# Patient Record
Sex: Male | Born: 1940 | Race: White | Hispanic: No | Marital: Married | Smoking: Never smoker
Health system: Southern US, Community
[De-identification: ages and names within clinical notes are randomized; demographics above are authoritative.]

## PROBLEM LIST (undated history)

## (undated) DIAGNOSIS — I2510 Atherosclerotic heart disease of native coronary artery without angina pectoris: Secondary | ICD-10-CM

## (undated) DIAGNOSIS — E785 Hyperlipidemia, unspecified: Secondary | ICD-10-CM

## (undated) DIAGNOSIS — E039 Hypothyroidism, unspecified: Secondary | ICD-10-CM

## (undated) DIAGNOSIS — J189 Pneumonia, unspecified organism: Secondary | ICD-10-CM

## (undated) DIAGNOSIS — C801 Malignant (primary) neoplasm, unspecified: Secondary | ICD-10-CM

## (undated) DIAGNOSIS — G4700 Insomnia, unspecified: Secondary | ICD-10-CM

## (undated) DIAGNOSIS — N186 End stage renal disease: Secondary | ICD-10-CM

## (undated) DIAGNOSIS — Z992 Dependence on renal dialysis: Secondary | ICD-10-CM

## (undated) DIAGNOSIS — G629 Polyneuropathy, unspecified: Secondary | ICD-10-CM

## (undated) DIAGNOSIS — I509 Heart failure, unspecified: Secondary | ICD-10-CM

## (undated) DIAGNOSIS — J302 Other seasonal allergic rhinitis: Secondary | ICD-10-CM

## (undated) DIAGNOSIS — D649 Anemia, unspecified: Secondary | ICD-10-CM

## (undated) DIAGNOSIS — R609 Edema, unspecified: Secondary | ICD-10-CM

## (undated) DIAGNOSIS — I96 Gangrene, not elsewhere classified: Secondary | ICD-10-CM

## (undated) DIAGNOSIS — M1990 Unspecified osteoarthritis, unspecified site: Secondary | ICD-10-CM

## (undated) DIAGNOSIS — E11319 Type 2 diabetes mellitus with unspecified diabetic retinopathy without macular edema: Secondary | ICD-10-CM

## (undated) DIAGNOSIS — M1A00X0 Idiopathic chronic gout, unspecified site, without tophus (tophi): Secondary | ICD-10-CM

## (undated) DIAGNOSIS — Z9289 Personal history of other medical treatment: Secondary | ICD-10-CM

## (undated) DIAGNOSIS — I10 Essential (primary) hypertension: Secondary | ICD-10-CM

## (undated) DIAGNOSIS — G4730 Sleep apnea, unspecified: Secondary | ICD-10-CM

## (undated) DIAGNOSIS — M1A9XX0 Chronic gout, unspecified, without tophus (tophi): Secondary | ICD-10-CM

## (undated) DIAGNOSIS — H5440 Blindness, one eye, unspecified eye: Secondary | ICD-10-CM

## (undated) DIAGNOSIS — L309 Dermatitis, unspecified: Secondary | ICD-10-CM

## (undated) DIAGNOSIS — I70209 Unspecified atherosclerosis of native arteries of extremities, unspecified extremity: Secondary | ICD-10-CM

## (undated) DIAGNOSIS — D869 Sarcoidosis, unspecified: Secondary | ICD-10-CM

## (undated) DIAGNOSIS — K219 Gastro-esophageal reflux disease without esophagitis: Secondary | ICD-10-CM

## (undated) HISTORY — DX: Idiopathic chronic gout, unspecified site, without tophus (tophi): M1A.00X0

## (undated) HISTORY — PX: EYE SURGERY: SHX253

## (undated) HISTORY — PX: CARDIAC SURGERY: SHX584

## (undated) HISTORY — DX: Unspecified atherosclerosis of native arteries of extremities, unspecified extremity: I70.209

## (undated) HISTORY — PX: OTHER SURGICAL HISTORY: SHX169

## (undated) HISTORY — DX: Edema, unspecified: R60.9

## (undated) HISTORY — PX: INSERTION OF DIALYSIS CATHETER: SHX1324

## (undated) HISTORY — DX: Hyperlipidemia, unspecified: E78.5

## (undated) HISTORY — DX: Chronic gout, unspecified, without tophus (tophi): M1A.9XX0

## (undated) HISTORY — DX: Atherosclerotic heart disease of native coronary artery without angina pectoris: I25.10

---

## 2009-06-07 HISTORY — PX: CORONARY ARTERY BYPASS GRAFT: SHX141

## 2012-01-21 ENCOUNTER — Emergency Department (HOSPITAL_COMMUNITY)
Admission: EM | Admit: 2012-01-21 | Discharge: 2012-01-21 | Disposition: A | Discharge status: Home / Self Care | Payer: Medicare Other | Attending: Emergency Medicine | Admitting: Emergency Medicine

## 2012-01-21 ENCOUNTER — Encounter (HOSPITAL_COMMUNITY): Payer: Self-pay | Admitting: Emergency Medicine

## 2012-01-21 DIAGNOSIS — R8271 Bacteriuria: Secondary | ICD-10-CM

## 2012-01-21 DIAGNOSIS — R82998 Other abnormal findings in urine: Secondary | ICD-10-CM

## 2012-01-21 DIAGNOSIS — Z79899 Other long term (current) drug therapy: Secondary | ICD-10-CM | POA: Insufficient documentation

## 2012-01-21 DIAGNOSIS — Z794 Long term (current) use of insulin: Secondary | ICD-10-CM | POA: Insufficient documentation

## 2012-01-21 DIAGNOSIS — E119 Type 2 diabetes mellitus without complications: Secondary | ICD-10-CM | POA: Insufficient documentation

## 2012-01-21 DIAGNOSIS — I10 Essential (primary) hypertension: Secondary | ICD-10-CM | POA: Insufficient documentation

## 2012-01-21 DIAGNOSIS — R319 Hematuria, unspecified: Secondary | ICD-10-CM | POA: Insufficient documentation

## 2012-01-21 HISTORY — DX: Essential (primary) hypertension: I10

## 2012-01-21 LAB — URINALYSIS, ROUTINE W REFLEX MICROSCOPIC
Bilirubin Urine: NEGATIVE
Ketones, ur: NEGATIVE mg/dL
Nitrite: NEGATIVE
Urobilinogen, UA: 0.2 mg/dL (ref 0.0–1.0)
pH: 5.5 (ref 5.0–8.0)

## 2012-01-21 LAB — URINE MICROSCOPIC-ADD ON

## 2012-01-21 MED ORDER — CIPROFLOXACIN HCL 500 MG PO TABS: 500.0000 mg | ORAL_TABLET | Freq: Two times a day (BID) | ORAL | Status: AC

## 2012-01-21 NOTE — ED Provider Notes (Signed)
History     CSN: [id number]  Arrival date & time 01/21/12  2018   First MD Initiated Contact with Patient 01/21/12 2152      Chief Complaint  Patient presents with  . Hematuria    (Consider location/radiation/quality/duration/timing/severity/associated sxs/prior treatment) HPI Pt reports blood in his urine since this morning. Pt denies pain.  Patient has had the same problem in the past and it was related to a urinary tract infection.  Patient has no fever or chills.  No nausea vomiting.  Patient's has no local physician the present time, but they just moved here.  I plan on getting a local physician.  I discussed bladder cancer in the differential of painless hematuria.  They assured me they would get a followup UA done after treatment with antibiotics.  This is to make sure that the hematuria has cleared.  Past Medical History  Diagnosis Date  . Hypertension   . Diabetes mellitus     Past Surgical History  Procedure Date  . Cardiac surgery     History reviewed. No pertinent family history.  History  Substance Use Topics  . Smoking status: Never Smoker   . Smokeless tobacco: Not on file  . Alcohol Use: No      Review of Systems  All other systems reviewed and are negative.    Allergies  Flu virus vaccine  Home Medications   Current Outpatient Rx  Name Route Sig Dispense Refill  . RISAQUAD PO CAPS Oral Take 1 capsule by mouth 2 (two) times daily.    . ALLOPURINOL 100 MG PO TABS Oral Take 200 mg by mouth daily.    Marland Kitchen AMLODIPINE BESYLATE 10 MG PO TABS Oral Take 10 mg by mouth daily.    . ASPIRIN 325 MG PO TABS Oral Take 325 mg by mouth daily.    . ATORVASTATIN CALCIUM 80 MG PO TABS Oral Take 80 mg by mouth daily.    Marland Kitchen CALCIUM CARBONATE-VITAMIN D 600-400 MG-UNIT PO TABS Oral Take 1 tablet by mouth daily.    Marland Kitchen CRANBERRY 450 MG PO TABS Oral Take 1 tablet by mouth 2 (two) times daily.    Marland Kitchen FERROUS SULFATE 325 (65 FE) MG PO TABS Oral Take 325 mg by mouth daily with  breakfast.    . FUROSEMIDE 80 MG PO TABS Oral Take 160 mg by mouth 2 (two) times daily.    . INSULIN ASPART 100 UNIT/ML [place] SOLN Subcutaneous Inject 2-10 Units into the skin 3 (three) times daily before meals.    . INSULIN GLARGINE 100 UNIT/ML [place] SOLN Subcutaneous Inject 45 Units into the skin every morning.    Marland Kitchen LEVOTHYROXINE SODIUM 112 MCG PO TABS Oral Take 112 mcg by mouth daily.    Marland Kitchen LISINOPRIL 40 MG PO TABS Oral Take 40 mg by mouth daily.    Marland Kitchen MAGNESIUM OXIDE 400 MG PO TABS Oral Take 400 mg by mouth daily.    Marland Kitchen METOPROLOL TARTRATE 100 MG PO TABS Oral Take 100 mg by mouth 2 (two) times daily.    Marland Kitchen POTASSIUM CHLORIDE CRYS ER 20 MEQ PO TBCR Oral Take 20 mEq by mouth daily.    Marland Kitchen PREDNISONE 1 MG PO TABS Oral Take 1 mg by mouth 2 (two) times daily.    Marland Kitchen CIPROFLOXACIN HCL 500 MG PO TABS Oral Take 1 tablet (500 mg total) by mouth every 12 (twelve) hours. 20 tablet 0    BP 155/77  Pulse 73  Temp 98.6 F (37 C) (Oral)  Resp 20  Ht 5\' 10"  (1.778 m)  Wt 245 lb (111.131 kg)  BMI 35.15 kg/m2  SpO2 97%  Physical Exam  Nursing note and vitals reviewed. Constitutional: He is oriented to person, place, and time. He appears well-developed. No distress.  HENT:  Head: Normocephalic and atraumatic.  Eyes: Pupils are equal, round, and reactive to light.  Neck: Normal range of motion.  Cardiovascular: Normal rate and intact distal pulses.   Pulmonary/Chest: No respiratory distress.  Abdominal: Normal appearance. He exhibits no distension.  Musculoskeletal: Normal range of motion.  Neurological: He is alert and oriented to person, place, and time. No cranial nerve deficit.  Skin: Skin is warm and dry. No rash noted.  Psychiatric: He has a normal mood and affect. His behavior is normal.    ED Course  Procedures (including critical care time)  Labs Reviewed  URINALYSIS, ROUTINE W REFLEX MICROSCOPIC - Abnormal; Notable for the following:    APPearance CLOUDY (*)     Glucose, UA >1000 (*)      Hgb urine dipstick LARGE (*)     Protein, ur 100 (*)     Leukocytes, UA MODERATE (*)     All other components within normal limits  URINE MICROSCOPIC-ADD ON - Abnormal; Notable for the following:    Bacteria, UA MANY (*)     All other components within normal limits  URINE CULTURE   No results found.   1. Bacteria in urine   2. Hematuria   3. Urine WBC increased       MDM          Nelia Shi, MD 01/21/12 2216

## 2012-01-21 NOTE — Discharge Instructions (Signed)
Make sure that you get a recheck urine after the antibiotics are finished and make sure the blood has cleared from the urine.  If there is still blood in the urine you're going to need to see urologist for a possible cystoscopy.  Blood and urine can be a sign of bladder cancer.

## 2012-01-21 NOTE — ED Notes (Signed)
Pt reports blood in his urine since this morning. Pt denies pain.

## 2012-01-24 LAB — URINE CULTURE

## 2012-01-25 NOTE — ED Notes (Signed)
+   Urine Patient treated with Cipro-sensitive to same-chart appended per protocol MD. 

## 2012-10-04 ENCOUNTER — Encounter (HOSPITAL_COMMUNITY): Payer: Self-pay | Admitting: *Deleted

## 2012-10-04 ENCOUNTER — Emergency Department (HOSPITAL_COMMUNITY): Payer: Medicare Other

## 2012-10-04 ENCOUNTER — Inpatient Hospital Stay (HOSPITAL_COMMUNITY)
Admission: EM | Admit: 2012-10-04 | Discharge: 2012-10-08 | DRG: 637 | Disposition: A | Discharge status: Home / Self Care | Payer: Medicare Other | Attending: Internal Medicine | Admitting: Internal Medicine

## 2012-10-04 DIAGNOSIS — Z7982 Long term (current) use of aspirin: Secondary | ICD-10-CM

## 2012-10-04 DIAGNOSIS — G9341 Metabolic encephalopathy: Secondary | ICD-10-CM | POA: Diagnosis present

## 2012-10-04 DIAGNOSIS — IMO0002 Reserved for concepts with insufficient information to code with codable children: Secondary | ICD-10-CM

## 2012-10-04 DIAGNOSIS — R001 Bradycardia, unspecified: Secondary | ICD-10-CM | POA: Diagnosis present

## 2012-10-04 DIAGNOSIS — Z6835 Body mass index (BMI) 35.0-35.9, adult: Secondary | ICD-10-CM

## 2012-10-04 DIAGNOSIS — G4730 Sleep apnea, unspecified: Secondary | ICD-10-CM | POA: Diagnosis present

## 2012-10-04 DIAGNOSIS — Z951 Presence of aortocoronary bypass graft: Secondary | ICD-10-CM

## 2012-10-04 DIAGNOSIS — Z8614 Personal history of Methicillin resistant Staphylococcus aureus infection: Secondary | ICD-10-CM

## 2012-10-04 DIAGNOSIS — E1139 Type 2 diabetes mellitus with other diabetic ophthalmic complication: Secondary | ICD-10-CM | POA: Diagnosis present

## 2012-10-04 DIAGNOSIS — I509 Heart failure, unspecified: Secondary | ICD-10-CM | POA: Diagnosis present

## 2012-10-04 DIAGNOSIS — I120 Hypertensive chronic kidney disease with stage 5 chronic kidney disease or end stage renal disease: Secondary | ICD-10-CM | POA: Diagnosis present

## 2012-10-04 DIAGNOSIS — M109 Gout, unspecified: Secondary | ICD-10-CM | POA: Diagnosis present

## 2012-10-04 DIAGNOSIS — E1129 Type 2 diabetes mellitus with other diabetic kidney complication: Secondary | ICD-10-CM | POA: Diagnosis present

## 2012-10-04 DIAGNOSIS — D649 Anemia, unspecified: Secondary | ICD-10-CM | POA: Diagnosis present

## 2012-10-04 DIAGNOSIS — R6881 Early satiety: Secondary | ICD-10-CM | POA: Diagnosis present

## 2012-10-04 DIAGNOSIS — E669 Obesity, unspecified: Secondary | ICD-10-CM | POA: Diagnosis present

## 2012-10-04 DIAGNOSIS — I498 Other specified cardiac arrhythmias: Secondary | ICD-10-CM | POA: Diagnosis present

## 2012-10-04 DIAGNOSIS — E860 Dehydration: Secondary | ICD-10-CM | POA: Diagnosis present

## 2012-10-04 DIAGNOSIS — H5440 Blindness, one eye, unspecified eye: Secondary | ICD-10-CM | POA: Diagnosis present

## 2012-10-04 DIAGNOSIS — Z794 Long term (current) use of insulin: Secondary | ICD-10-CM

## 2012-10-04 DIAGNOSIS — N189 Chronic kidney disease, unspecified: Secondary | ICD-10-CM | POA: Diagnosis present

## 2012-10-04 DIAGNOSIS — R680 Hypothermia, not associated with low environmental temperature: Secondary | ICD-10-CM | POA: Diagnosis present

## 2012-10-04 DIAGNOSIS — E039 Hypothyroidism, unspecified: Secondary | ICD-10-CM | POA: Diagnosis present

## 2012-10-04 DIAGNOSIS — N058 Unspecified nephritic syndrome with other morphologic changes: Secondary | ICD-10-CM | POA: Diagnosis present

## 2012-10-04 DIAGNOSIS — T68XXXA Hypothermia, initial encounter: Secondary | ICD-10-CM

## 2012-10-04 DIAGNOSIS — I2510 Atherosclerotic heart disease of native coronary artery without angina pectoris: Secondary | ICD-10-CM | POA: Diagnosis present

## 2012-10-04 DIAGNOSIS — N039 Chronic nephritic syndrome with unspecified morphologic changes: Secondary | ICD-10-CM | POA: Diagnosis present

## 2012-10-04 DIAGNOSIS — R609 Edema, unspecified: Secondary | ICD-10-CM | POA: Diagnosis present

## 2012-10-04 DIAGNOSIS — D631 Anemia in chronic kidney disease: Secondary | ICD-10-CM | POA: Diagnosis present

## 2012-10-04 DIAGNOSIS — G609 Hereditary and idiopathic neuropathy, unspecified: Secondary | ICD-10-CM | POA: Diagnosis present

## 2012-10-04 DIAGNOSIS — E11319 Type 2 diabetes mellitus with unspecified diabetic retinopathy without macular edema: Secondary | ICD-10-CM | POA: Diagnosis present

## 2012-10-04 DIAGNOSIS — E162 Hypoglycemia, unspecified: Secondary | ICD-10-CM | POA: Diagnosis present

## 2012-10-04 DIAGNOSIS — R578 Other shock: Secondary | ICD-10-CM | POA: Diagnosis present

## 2012-10-04 DIAGNOSIS — E1169 Type 2 diabetes mellitus with other specified complication: Principal | ICD-10-CM | POA: Diagnosis present

## 2012-10-04 DIAGNOSIS — Z79899 Other long term (current) drug therapy: Secondary | ICD-10-CM

## 2012-10-04 DIAGNOSIS — N185 Chronic kidney disease, stage 5: Secondary | ICD-10-CM | POA: Diagnosis present

## 2012-10-04 DIAGNOSIS — N179 Acute kidney failure, unspecified: Secondary | ICD-10-CM | POA: Diagnosis present

## 2012-10-04 DIAGNOSIS — I959 Hypotension, unspecified: Secondary | ICD-10-CM | POA: Diagnosis present

## 2012-10-04 HISTORY — DX: Sleep apnea, unspecified: G47.30

## 2012-10-04 HISTORY — DX: Hypothyroidism, unspecified: E03.9

## 2012-10-04 HISTORY — DX: Anemia, unspecified: D64.9

## 2012-10-04 HISTORY — DX: Blindness, one eye, unspecified eye: H54.40

## 2012-10-04 HISTORY — DX: Heart failure, unspecified: I50.9

## 2012-10-04 HISTORY — DX: Atherosclerotic heart disease of native coronary artery without angina pectoris: I25.10

## 2012-10-04 LAB — COMPREHENSIVE METABOLIC PANEL
BUN: 75 mg/dL — ABNORMAL HIGH (ref 6–23)
CO2: 24 mEq/L (ref 19–32)
Calcium: 9.1 mg/dL (ref 8.4–10.5)
Creatinine, Ser: 4.59 mg/dL — ABNORMAL HIGH (ref 0.50–1.35)
GFR calc Af Amer: 14 mL/min — ABNORMAL LOW (ref >90)
GFR calc non Af Amer: 12 mL/min — ABNORMAL LOW (ref >90)
Glucose, Bld: 114 mg/dL — ABNORMAL HIGH (ref 70–99)
Sodium: 135 mEq/L (ref 135–145)
Total Protein: 7 g/dL (ref 6.0–8.3)

## 2012-10-04 LAB — CBC
HCT: 30.1 % — ABNORMAL LOW (ref 39.0–52.0)
Hemoglobin: 9.9 g/dL — ABNORMAL LOW (ref 13.0–17.0)
MCHC: 32.9 g/dL (ref 30.0–36.0)
RDW: 13.6 % (ref 11.5–15.5)
WBC: 7.5 10*3/uL (ref 4.0–10.5)

## 2012-10-04 LAB — CBC WITH DIFFERENTIAL/PLATELET
Eosinophils Absolute: 0.6 10*3/uL (ref 0.0–0.7)
Eosinophils Relative: 8 % — ABNORMAL HIGH (ref 0–5)
HCT: 31.4 % — ABNORMAL LOW (ref 39.0–52.0)
Lymphocytes Relative: 13 % (ref 12–46)
Lymphs Abs: 1 10*3/uL (ref 0.7–4.0)
MCH: 30.3 pg (ref 26.0–34.0)
MCV: 89.7 fL (ref 78.0–100.0)
Monocytes Absolute: 0.6 10*3/uL (ref 0.1–1.0)
Monocytes Relative: 8 % (ref 3–12)
Platelets: 289 10*3/uL (ref 150–400)
RBC: 3.5 MIL/uL — ABNORMAL LOW (ref 4.22–5.81)

## 2012-10-04 LAB — CREATININE, SERUM
Creatinine, Ser: 4.68 mg/dL — ABNORMAL HIGH (ref 0.50–1.35)
GFR calc Af Amer: 13 mL/min — ABNORMAL LOW (ref >90)
GFR calc non Af Amer: 12 mL/min — ABNORMAL LOW (ref >90)

## 2012-10-04 LAB — URINALYSIS, ROUTINE W REFLEX MICROSCOPIC
Bilirubin Urine: NEGATIVE
Leukocytes, UA: NEGATIVE
Nitrite: NEGATIVE
Specific Gravity, Urine: 1.013 (ref 1.005–1.030)
Urobilinogen, UA: 0.2 mg/dL (ref 0.0–1.0)
pH: 5 (ref 5.0–8.0)

## 2012-10-04 LAB — TROPONIN I: Troponin I: <0.3 ng/mL (ref <0.30)

## 2012-10-04 LAB — GLUCOSE, CAPILLARY
Glucose-Capillary: 115 mg/dL — ABNORMAL HIGH (ref 70–99)
Glucose-Capillary: 150 mg/dL — ABNORMAL HIGH (ref 70–99)

## 2012-10-04 LAB — PROCALCITONIN: Procalcitonin: <0.1 ng/mL

## 2012-10-04 LAB — URINE MICROSCOPIC-ADD ON: Urine-Other: NONE SEEN

## 2012-10-04 MED ORDER — ONDANSETRON HCL 4 MG/2ML IJ SOLN: 4.0000 mg | Freq: Four times a day (QID) | INTRAMUSCULAR | Status: DC | PRN

## 2012-10-04 MED ORDER — ONDANSETRON HCL 4 MG/2ML IJ SOLN
4.0000 mg | Freq: Once | INTRAMUSCULAR | Status: AC
  Administered 2012-10-04: 4 mg via INTRAVENOUS
  Filled 2012-10-04: qty 2

## 2012-10-04 MED ORDER — GLUCOSE 40 % PO GEL: 1.0000 | ORAL | Status: DC | PRN

## 2012-10-04 MED ORDER — HYDROCODONE-ACETAMINOPHEN 5-325 MG PO TABS: 1.0000 | ORAL_TABLET | ORAL | Status: DC | PRN

## 2012-10-04 MED ORDER — ONDANSETRON HCL 4 MG PO TABS: 4.0000 mg | ORAL_TABLET | Freq: Four times a day (QID) | ORAL | Status: DC | PRN

## 2012-10-04 MED ORDER — GLUCOSE-VITAMIN C 4-6 GM-MG PO CHEW: 4.0000 | CHEWABLE_TABLET | ORAL | Status: DC | PRN

## 2012-10-04 MED ORDER — ASPIRIN 325 MG PO TABS
325.0000 mg | ORAL_TABLET | Freq: Every day | ORAL | Status: DC
  Administered 2012-10-04 – 2012-10-08 (×5): 325 mg via ORAL
  Filled 2012-10-04 (×5): qty 1

## 2012-10-04 MED ORDER — ENOXAPARIN SODIUM 40 MG/0.4ML ~~LOC~~ SOLN
40.0000 mg | SUBCUTANEOUS | Status: DC
  Administered 2012-10-04: 40 mg via SUBCUTANEOUS
  Filled 2012-10-04 (×2): qty 0.4

## 2012-10-04 MED ORDER — DEXTROSE 50 % IV SOLN: 25.0000 mL | Freq: Once | INTRAVENOUS | Status: AC | PRN

## 2012-10-04 MED ORDER — LEVOTHYROXINE SODIUM 112 MCG PO TABS
112.0000 ug | ORAL_TABLET | Freq: Every day | ORAL | Status: DC
  Administered 2012-10-05 – 2012-10-08 (×4): 112 ug via ORAL
  Filled 2012-10-04 (×6): qty 1

## 2012-10-04 MED ORDER — SODIUM CHLORIDE 0.9 % IJ SOLN
3.0000 mL | Freq: Two times a day (BID) | INTRAMUSCULAR | Status: DC
  Administered 2012-10-06 – 2012-10-08 (×6): 3 mL via INTRAVENOUS

## 2012-10-04 MED ORDER — SODIUM CHLORIDE 0.9 % IV SOLN
Freq: Once | INTRAVENOUS | Status: AC
  Administered 2012-10-04: 18:00:00 via INTRAVENOUS

## 2012-10-04 MED ORDER — DEXTROSE 50 % IV SOLN: 50.0000 mL | Freq: Once | INTRAVENOUS | Status: AC | PRN

## 2012-10-04 MED ORDER — ACETAMINOPHEN 650 MG RE SUPP: 650.0000 mg | Freq: Four times a day (QID) | RECTAL | Status: DC | PRN

## 2012-10-04 MED ORDER — HYDROCORTISONE SOD SUCCINATE 100 MG IJ SOLR
100.0000 mg | Freq: Four times a day (QID) | INTRAMUSCULAR | Status: DC
  Administered 2012-10-04 – 2012-10-06 (×5): 100 mg via INTRAVENOUS
  Administered 2012-10-06: 09:00:00 via INTRAVENOUS
  Administered 2012-10-06: 100 mg via INTRAVENOUS
  Filled 2012-10-04 (×14): qty 2

## 2012-10-04 MED ORDER — HYDROCORTISONE SOD SUCCINATE 100 MG PF FOR IT USE: 100.0000 mg | Freq: Four times a day (QID) | INTRAMUSCULAR | Status: DC

## 2012-10-04 MED ORDER — DEXTROSE-NACL 5-0.45 % IV SOLN
INTRAVENOUS | Status: DC
  Administered 2012-10-04: 23:00:00 via INTRAVENOUS

## 2012-10-04 MED ORDER — ACETAMINOPHEN 325 MG PO TABS: 650.0000 mg | ORAL_TABLET | Freq: Four times a day (QID) | ORAL | Status: DC | PRN

## 2012-10-04 NOTE — ED Notes (Addendum)
Per ems pt is from extended stay facility, [facility]. Pt reports n/v x2 weeks. Vomits after eating food. Wife found pt unresponsive in the bed supine. Upon ems arrival responsive to verbal stimuli. Pt has hx of kidney failure and was on dialysis. 20g IV L hand. Cbg 45 upon arrival. Given 25g D10, 250 ml NS. Last cbg 187, last BP 105/ 58  Pt denies diarrhea, wife reports pt is having diarrhea. Pt was treated in ED for hypoglycemia about 1.5 years ago.

## 2012-10-04 NOTE — H&P (Signed)
PCP: [name], MD  [facility]   Chief Complaint:  [name]  HPI: Jack Chung is a 72 y.o. male   has a past medical history of Hypertension; Diabetes mellitus; Chronic kidney disease; Hypothyroidism; CHF (congestive heart failure); Anemia; Blind left eye; Sleep apnea; and Coronary artery disease.   Presented with  Patient have moved from [place] here since July. He has hx of CKD but have not yet established care with nephrologist. Wife thinks his cr is around 3.0 at baseline. In 2011 he briefly required HD for 3 months but have done well since then. His PCP has checked his renal function in January but unsure of the numbers. They were traveling across country for the past 1 month. While in [place] he had an episode of Nausea vomiting and diarrhea.  His wife drove him home and was going to schedule an appointment with his PCP next week. They waited due to financial reasons. He have never completely recovered. He have had very poor PO intake. He has early satiety and not able to eat well. He has had significant peripheral edema.  Wife denies any fever but he has been feeling very cold for a bout 1 week. Today wife found him unresponsive and checked his blood sugar and it was undetectable. She gave some glucotrol under his tongue when  EMS arrived his BG was 45 his SBP was in 70's and his body temperature was very low. He was also noted to be bradycardic down to 30's.  Patient have had some nausea and vomiting last week but not now.  Patient's wife brought in some records with her,  that showed that his last creatinine on February 20 was 4.69. At that time his hemoglobin was 11.3 and TSH was within normal limits. Nephrology has been called and will see patient in consult tomorrow hospitalist admit for further evaluation.   Review of Systems:     Pertinent positives include: fatigue, no Bilateral lower extremity swelling  nausea, vomiting,  Constitutional:  No weight loss, night sweats, Fevers,  chills, weight loss  HEENT:  No headaches, Difficulty swallowing,Tooth/dental problems,Sore throat,  No sneezing, itching, ear ache, nasal congestion, post nasal drip,  Cardio-vascular:  No chest pain, Orthopnea, PND, anasarca, dizziness, palpitations. GI:  No heartburn, indigestion, abdominal pain, diarrhea, change in bowel habits, loss of appetite, melena, blood in stool, hematemesis Resp:  no shortness of breath at rest. No dyspnea on exertion, No excess mucus, no productive cough, No non-productive cough, No coughing up of blood.No change in color of mucus.No wheezing. Skin:  no rash or lesions. No jaundice GU:  no dysuria, change in color of urine, no urgency or frequency. No straining to urinate.  No flank pain.  Musculoskeletal:  No joint pain or no joint swelling. No decreased range of motion. No back pain.  Psych:  No change in mood or affect. No depression or anxiety. No memory loss.  Neuro: no localizing neurological complaints, no tingling, no weakness, no double vision, no gait abnormality, no slurred speech, no confusion  Otherwise ROS are negative except for above, 10 systems were reviewed  Past Medical History: Past Medical History  Diagnosis Date  . Hypertension   . Diabetes mellitus   . Chronic kidney disease     was on dialysis in July 2011  . Hypothyroidism   . CHF (congestive heart failure)   . Anemia   . Blind left eye   . Sleep apnea     not on CPAP  .  Coronary artery disease    Past Surgical History  Procedure Laterality Date  . Cardiac surgery      total of 6 surgeries, 5 related to mrsa  . 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 Authorizing Provider  acidophilus (RISAQUAD) CAPS Take 1 capsule by mouth 2 (two) times daily.    Historical Provider, MD  allopurinol (ZYLOPRIM) 100 MG tablet Take 100 mg by mouth 3 (three) times daily.     Historical  Provider, MD  amLODipine (NORVASC) 10 MG tablet Take 10 mg by mouth daily.    Historical Provider, MD  aspirin 325 MG tablet Take 325 mg by mouth daily.    Historical Provider, MD  atorvastatin (LIPITOR) 80 MG tablet Take 80 mg by mouth daily.    Historical Provider, MD  Calcium Carbonate-Vitamin D (CALTRATE 600+D) 600-400 MG-UNIT per tablet Take 1 tablet by mouth daily.    Historical Provider, MD  Cranberry 450 MG TABS Take 1 tablet by mouth 2 (two) times daily.    Historical Provider, MD  ferrous sulfate 325 (65 FE) MG tablet Take 325 mg by mouth daily with breakfast.    Historical Provider, MD  furosemide (LASIX) 80 MG tablet Take 40-60 mg by mouth 2 (two) times daily. Takes 60 mg in AM and 40 mg at night    Historical Provider, MD  insulin aspart (NOVOLOG) 100 UNIT/ML injection Inject 2-10 Units into the skin 3 (three) times daily before meals.    Historical Provider, MD  insulin glargine (LANTUS) 100 UNIT/ML injection Inject 45 Units into the skin every morning.    Historical Provider, MD  levothyroxine (SYNTHROID, LEVOTHROID) 112 MCG tablet Take 112 mcg by mouth daily.    Historical Provider, MD  lisinopril (PRINIVIL,ZESTRIL) 40 MG tablet Take 40 mg by mouth daily.    Historical Provider, MD  magnesium oxide (MAG-OX) 400 MG tablet Take 400 mg by mouth daily.    Historical Provider, MD  metoprolol (LOPRESSOR) 100 MG tablet Take 100 mg by mouth 2 (two) times daily.    Historical Provider, MD  potassium chloride SA (K-DUR,KLOR-CON) 20 MEQ tablet Take 20 mEq by mouth daily.    Historical Provider, MD  predniSONE (DELTASONE) 1 MG tablet Take 1 mg by mouth 2 (two) times daily.    Historical Provider, MD    Allergies:   Allergies  Allergen Reactions  . Flu Virus Vaccine     Gets the flu    Social History:  Ambulatory with  Walker/ cane Lives at  home   reports that he has never smoked. He has never used smokeless tobacco. He reports that he does not drink alcohol or use illicit drugs.    Family History: family history includes COPD in his father and mother.    Physical Exam: Patient Vitals for the past 24 hrs:  BP Temp Temp src Pulse Resp SpO2  10/04/12 1916 - - - 47 13 -  10/04/12 1900 100/50 mmHg - - 48 - 99 %  10/04/12 1846 - 93.2 F (34 C) Rectal - - -  10/04/12 1845 97/55 mmHg - - 68 13 -  10/04/12 1840 - 93.2 F (34 C) Rectal 46 16 100 %  10/04/12 1833 - - - 47 - -  10/04/12 1815 - - - 47 - 99 %  10/04/12 1746 - - - 46 13 99 %  10/04/12 1730 109/57 mmHg - -  46 16 -  10/04/12 1718 110/56 mmHg 93.3 F (34.1 C) Rectal 43 14 100 %  10/04/12 1715 110/56 mmHg - - 48 20 100 %  10/04/12 1710 113/53 mmHg - - 45 - -    1. General:  in No Acute distress 2. Psychological: lethargic but arousable  3. Head/ENT:    Moist Mucous Membranes                          Head Non traumatic, neck supple                          Normal  Dentition 4. SKIN: normal  Skin turgor,  Skin clean Dry and intact no rash 5. Heart: Regular rate and rhythm no Murmur, Rub or gallop 6. Lungs: Clear to auscultation bilaterally, no wheezes or crackles   7. Abdomen: Soft, non-tender, Non distended 8. Lower extremities: no clubbing, cyanosis, or edema 9. Neurologically Grossly intact, moving all 4 extremities equally 10. MSK: Normal range of motion  body mass index is unknown because there is no weight on file.   Labs on Admission:   Recent Labs  10/04/12 1732  NA 135  K 3.7  CL 96  CO2 24  GLUCOSE 114*  BUN 75*  CREATININE 4.59*  CALCIUM 9.1    Recent Labs  10/04/12 1732  AST 19  ALT 12  ALKPHOS 95  BILITOT 0.1*  PROT 7.0  ALBUMIN 3.4*   No results found for this basename: LIPASE, AMYLASE,  in the last 72 hours  Recent Labs  10/04/12 1732  WBC 7.5  NEUTROABS 5.3  HGB 10.6*  HCT 31.4*  MCV 89.7  PLT 289   No results found for this basename: CKTOTAL, CKMB, CKMBINDEX, TROPONINI,  in the last 72 hours No results found for this basename: TSH, T4TOTAL, FREET3,  T3FREE, THYROIDAB,  in the last 72 hours No results found for this basename: VITAMINB12, FOLATE, FERRITIN, TIBC, IRON, RETICCTPCT,  in the last 72 hours No results found for this basename: HGBA1C    The CrCl is unknown because both a height and weight (above a minimum accepted value) are required for this calculation. ABG No results found for this basename: phart, pco2, po2, hco3, tco2, acidbasedef, o2sat     No results found for this basename: DDIMER     Other results:  I have pearsonaly reviewed this: ECG REPORT  Rate: 48  Rhythm: sinus bradycardia ST&T Change: no ischemia  UA  Protein 30 no infection   Cultures:    Component Value Date/Time   SDES URINE, CLEAN CATCH 01/21/2012 2159   SPECREQUEST NONE 01/21/2012 2159   CULT PROTEUS MIRABILIS 01/21/2012 2159   REPTSTATUS 01/24/2012 FINAL 01/21/2012 2159       Radiological Exams on Admission: Dg Chest Portable 1 View  10/04/2012  *RADIOLOGY REPORT*  Clinical Data: Hypoglycemia.  Weakness.  PORTABLE CHEST - 1 VIEW  Comparison: None.  Findings: The cardiopericardial silhouette is enlarged.  Single median sternotomy wire is present. Monitoring leads are projected over the chest.  No airspace disease.  No effusion.  Suboptimal inspiration.  Basilar atelectasis.  IMPRESSION: Cardiomegaly without failure.   Original Report Authenticated By: Andreas Newport, M.D.     Chart has been reviewed  Assessment/Plan  72 year old gentleman with history of chronic kidney disease who was found to be severely hypoglycemic, unresponsive, hypothermic, and bradycardic currently improving.  Present on Admission:  .  Hypotension - etiology not quite clear could be partially secondary to dehydration this patient had not had much of by mouth intake and continued to take Lasix. Sepsis could not be excluded given hypothermia. lactic acid is unknown limits and reassuring. Obtain Propulsid 20 blood cultures monitor and step down. Hold metoprolol and  Norvasc. IV fluids. Hold antibiotics for now as is no evidence of infection. CCM will help monitor through e-link . Bradycardia - possibly related to hypothermia hold metoprolol order echo gram cycle cardiac enzymes. Ordered TSH T4 and T3. Monitor on telemetry if persists cardiology consult may be needed. Currently improving.  . Hypoglycemia - hypoglycemia protocol hold Lantus. Patient likely has poor insulin clearance secondary to renal insufficiency  . Anemia - stable chronic likely secondary to chronic kidney disease  . CKD (chronic kidney disease) -at baseline. Nephrology is aware currently no acute indication for hemodialysis   . Encephalopathy, metabolic - Likely secondary to hypothermia hypoglycemia. Improving with stabilization of vital signs and glucose.  Prophylaxis: Lovenox, Protonix  CODE STATUS: FULL CODE  Other plan as per orders.  I have spent a total of 65 min on this admission. Time taken to speak to CCM  [facility] 10/04/2012, 7:54 PM

## 2012-10-04 NOTE — ED Notes (Signed)
Bed:WA14<BR> Expected date:<BR> Expected time:<BR> Means of arrival:<BR> Comments:<BR> ems

## 2012-10-04 NOTE — ED Provider Notes (Signed)
History     CSN: [id number]  Arrival date & time 10/04/12  1659   First MD Initiated Contact with Patient 10/04/12 1711      Chief Complaint  Patient presents with  . Hypoglycemia    (Consider location/radiation/quality/duration/timing/severity/associated sxs/prior treatment) The history is provided by the patient and the EMS personnel.   72 year old male was brought in by ambulance because of low blood sugar. EMS reports blood sugar of 44 which went up to 180 after intravenous dextrose. He has been having problems with vomiting for the last 2 weeks and has been having decreased oral intake. He relates his blood sugars have generally been low in the morning but generally her in moderately high in the afternoon. He denies any pain anywhere and denies fever or chills. He denies constipation or diarrhea.  Past Medical History  Diagnosis Date  . Hypertension   . Diabetes mellitus     Past Surgical History  Procedure Laterality Date  . Cardiac surgery      History reviewed. No pertinent family history.  History  Substance Use Topics  . Smoking status: Never Smoker   . Smokeless tobacco: Not on file  . Alcohol Use: No      Review of Systems  All other systems reviewed and are negative.    Allergies  Flu virus vaccine  Home Medications   Current Outpatient Rx  Name  Route  Sig  Dispense  Refill  . acidophilus (RISAQUAD) CAPS   Oral   Take 1 capsule by mouth 2 (two) times daily.         Marland Kitchen allopurinol (ZYLOPRIM) 100 MG tablet   Oral   Take 200 mg by mouth daily.         Marland Kitchen amLODipine (NORVASC) 10 MG tablet   Oral   Take 10 mg by mouth daily.         Marland Kitchen aspirin 325 MG tablet   Oral   Take 325 mg by mouth daily.         Marland Kitchen atorvastatin (LIPITOR) 80 MG tablet   Oral   Take 80 mg by mouth daily.         . Calcium Carbonate-Vitamin D (CALTRATE 600+D) 600-400 MG-UNIT per tablet   Oral   Take 1 tablet by mouth daily.         . Cranberry 450 MG  TABS   Oral   Take 1 tablet by mouth 2 (two) times daily.         . ferrous sulfate 325 (65 FE) MG tablet   Oral   Take 325 mg by mouth daily with breakfast.         . furosemide (LASIX) 80 MG tablet   Oral   Take 160 mg by mouth 2 (two) times daily.         . insulin aspart (NOVOLOG) 100 UNIT/ML injection   Subcutaneous   Inject 2-10 Units into the skin 3 (three) times daily before meals.         . insulin glargine (LANTUS) 100 UNIT/ML injection   Subcutaneous   Inject 45 Units into the skin every morning.         Marland Kitchen levothyroxine (SYNTHROID, LEVOTHROID) 112 MCG tablet   Oral   Take 112 mcg by mouth daily.         Marland Kitchen lisinopril (PRINIVIL,ZESTRIL) 40 MG tablet   Oral   Take 40 mg by mouth daily.         Marland Kitchen  magnesium oxide (MAG-OX) 400 MG tablet   Oral   Take 400 mg by mouth daily.         . metoprolol (LOPRESSOR) 100 MG tablet   Oral   Take 100 mg by mouth 2 (two) times daily.         . potassium chloride SA (K-DUR,KLOR-CON) 20 MEQ tablet   Oral   Take 20 mEq by mouth daily.         . predniSONE (DELTASONE) 1 MG tablet   Oral   Take 1 mg by mouth 2 (two) times daily.           BP 110/56  Pulse 43  Temp(Src) 93.3 F (34.1 C) (Rectal)  Resp 14  SpO2 100%  Physical Exam  Nursing note and vitals reviewed.  72 year old male, resting comfortably and in no acute distress. Vital signs are significant for bradycardia with heart rate of 43, and hypothermia with temperature 93.3. Oxygen saturation is 100%, which is normal. Head is normocephalic and atraumatic. PERRLA, EOMI. Oropharynx is clear. Neck is nontender and supple without adenopathy or JVD. Back is nontender and there is no CVA tenderness. Lungs are clear without rales, wheezes, or rhonchi. Chest is nontender. Heart has regular rate and rhythm without murmur. Abdomen is soft, flat, nontender without masses or hepatosplenomegaly and peristalsis is normoactive. Extremities have no  cyanosis or edema, full range of motion is present. Skin is warm and dry without rash. Neurologic: Mental status is normal, cranial nerves are intact, there are no motor or sensory deficits.  ED Course  Procedures (including critical care time)  Results for orders placed during the hospital encounter of 10/04/12  GLUCOSE, CAPILLARY      Result Value Range   Glucose-Capillary 104 (*) 70 - 99 mg/dL  CBC WITH DIFFERENTIAL      Result Value Range   WBC 7.5  4.0 - 10.5 K/uL   RBC 3.50 (*) 4.22 - 5.81 MIL/uL   Hemoglobin 10.6 (*) 13.0 - 17.0 g/dL   HCT 16.1 (*) 09.6 - 04.5 %   MCV 89.7  78.0 - 100.0 fL   MCH 30.3  26.0 - 34.0 pg   MCHC 33.8  30.0 - 36.0 g/dL   RDW 40.9  81.1 - 91.4 %   Platelets 289  150 - 400 K/uL   Neutrophils Relative 70  43 - 77 %   Neutro Abs 5.3  1.7 - 7.7 K/uL   Lymphocytes Relative 13  12 - 46 %   Lymphs Abs 1.0  0.7 - 4.0 K/uL   Monocytes Relative 8  3 - 12 %   Monocytes Absolute 0.6  0.1 - 1.0 K/uL   Eosinophils Relative 8 (*) 0 - 5 %   Eosinophils Absolute 0.6  0.0 - 0.7 K/uL   Basophils Relative 1  0 - 1 %   Basophils Absolute 0.1  0.0 - 0.1 K/uL  COMPREHENSIVE METABOLIC PANEL      Result Value Range   Sodium 135  135 - 145 mEq/L   Potassium 3.7  3.5 - 5.1 mEq/L   Chloride 96  96 - 112 mEq/L   CO2 24  19 - 32 mEq/L   Glucose, Bld 114 (*) 70 - 99 mg/dL   BUN 75 (*) 6 - 23 mg/dL   Creatinine, Ser 7.82 (*) 0.50 - 1.35 mg/dL   Calcium 9.1  8.4 - 95.6 mg/dL   Total Protein 7.0  6.0 - 8.3 g/dL   Albumin  3.4 (*) 3.5 - 5.2 g/dL   AST 19  0 - 37 U/L   ALT 12  0 - 53 U/L   Alkaline Phosphatase 95  39 - 117 U/L   Total Bilirubin 0.1 (*) 0.3 - 1.2 mg/dL   GFR calc non Af Amer 12 (*) >90 mL/min   GFR calc Af Amer 14 (*) >90 mL/min  LACTIC ACID, PLASMA      Result Value Range   Lactic Acid, Venous 1.1  0.5 - 2.2 mmol/L  GLUCOSE, CAPILLARY      Result Value Range   Glucose-Capillary 115 (*) 70 - 99 mg/dL   Dg Chest Portable 1 View  10/04/2012   *RADIOLOGY REPORT*  Clinical Data: Hypoglycemia.  Weakness.  PORTABLE CHEST - 1 VIEW  Comparison: None.  Findings: The cardiopericardial silhouette is enlarged.  Single median sternotomy wire is present. Monitoring leads are projected over the chest.  No airspace disease.  No effusion.  Suboptimal inspiration.  Basilar atelectasis.  IMPRESSION: Cardiomegaly without failure.   Original Report Authenticated By: Andreas Newport, M.D.       Date: 10/04/2012  Rate: 48  Rhythm: sinus bradycardia and premature ventricular contractions (PVC)  QRS Axis: normal  Intervals: PR prolonged  ST/T Wave abnormalities: normal  Conduction Disutrbances:first-degree A-V block  and right bundle branch block  Narrative Interpretation: Sinus bradycardia with PVCs, low-voltage, first degree AV block, right bundle branch block. No prior ECG available for comparison.  Old EKG Reviewed: none available    1. Hypoglycemia   2. Acute on chronic renal failure   3. Hypothermia due to non-environmental cause, initial encounter    CRITICAL CARE Performed by: JYNWG,NFAOZ   Total critical care time: 50 minutes  Critical care time was exclusive of separately billable procedures and treating other patients.  Critical care was necessary to treat or prevent imminent or life-threatening deterioration.  Critical care was time spent personally by me on the following activities: development of treatment plan with patient and/or surrogate as well as nursing, discussions with consultants, evaluation of patient's response to treatment, examination of patient, obtaining history from patient or surrogate, ordering and performing treatments and interventions, ordering and review of laboratory studies, ordering and review of radiographic studies, pulse oximetry and re-evaluation of patient's condition.   MDM  Hypoglycemia which is probably related to poor oral intake. Hypothermia of uncertain cause. He'll be placed in warming blanket  and given a meal to try and keep his blood sugar from going well again. He'll also be evaluated for occult infection with chest x-ray and urinalysis.   After food he seems more alert. His wife arrived and states that she found him completely unresponsive and blood sugar was undetectable and her machine and she given him some oral glucose prior to paramedics arriving. He apparently does have history of renal insufficiency and had been on dialysis one year ago but had come off of dialysis. She does not know what his last creatinine level had been. Today creatinine is 4.75. Worsening renal function could certainly put him at higher risk for hypoglycemia. Case is discussed with Dr. Kara Pacer of [facility] who has requested nephrology consultation. Pain in order to determine the appropriate campus to have him admitted to.  Case is discussed with Dr. Lowell Guitar who states patient to be admitted here and he will see the patient in consultation.  Dione Booze, MD 10/04/12 2024

## 2012-10-04 NOTE — ED Notes (Signed)
Hospitalist at bedside 

## 2012-10-05 ENCOUNTER — Inpatient Hospital Stay (HOSPITAL_COMMUNITY): Payer: Medicare Other

## 2012-10-05 DIAGNOSIS — D649 Anemia, unspecified: Secondary | ICD-10-CM

## 2012-10-05 DIAGNOSIS — I519 Heart disease, unspecified: Secondary | ICD-10-CM

## 2012-10-05 DIAGNOSIS — N179 Acute kidney failure, unspecified: Secondary | ICD-10-CM

## 2012-10-05 LAB — CBC
MCH: 29.1 pg (ref 26.0–34.0)
MCV: 89.8 fL (ref 78.0–100.0)
Platelets: 270 10*3/uL (ref 150–400)
RBC: 3.33 MIL/uL — ABNORMAL LOW (ref 4.22–5.81)
RDW: 13.5 % (ref 11.5–15.5)
WBC: 7.7 10*3/uL (ref 4.0–10.5)

## 2012-10-05 LAB — IRON AND TIBC
Saturation Ratios: 21 % (ref 20–55)
TIBC: 262 ug/dL (ref 215–435)
UIBC: 207 ug/dL (ref 125–400)

## 2012-10-05 LAB — COMPREHENSIVE METABOLIC PANEL
Alkaline Phosphatase: 90 U/L (ref 39–117)
BUN: 76 mg/dL — ABNORMAL HIGH (ref 6–23)
CO2: 27 mEq/L (ref 19–32)
Chloride: 97 mEq/L (ref 96–112)
Creatinine, Ser: 4.6 mg/dL — ABNORMAL HIGH (ref 0.50–1.35)
GFR calc non Af Amer: 12 mL/min — ABNORMAL LOW (ref >90)
Potassium: 4.3 mEq/L (ref 3.5–5.1)
Total Bilirubin: 0.2 mg/dL — ABNORMAL LOW (ref 0.3–1.2)

## 2012-10-05 LAB — VITAMIN B12: Vitamin B-12: 629 pg/mL (ref 211–911)

## 2012-10-05 LAB — GLUCOSE, CAPILLARY
Glucose-Capillary: 141 mg/dL — ABNORMAL HIGH (ref 70–99)
Glucose-Capillary: 159 mg/dL — ABNORMAL HIGH (ref 70–99)
Glucose-Capillary: 162 mg/dL — ABNORMAL HIGH (ref 70–99)
Glucose-Capillary: 308 mg/dL — ABNORMAL HIGH (ref 70–99)

## 2012-10-05 LAB — T4, FREE: Free T4: 1.07 ng/dL (ref 0.80–1.80)

## 2012-10-05 LAB — RETICULOCYTES: Retic Ct Pct: 1.8 % (ref 0.4–3.1)

## 2012-10-05 LAB — T3: T3, Total: 71.4 ng/dl — ABNORMAL LOW (ref 80.0–204.0)

## 2012-10-05 LAB — TROPONIN I: Troponin I: <0.3 ng/mL (ref <0.30)

## 2012-10-05 MED ORDER — INSULIN ASPART 100 UNIT/ML ~~LOC~~ SOLN
0.0000 [IU] | Freq: Three times a day (TID) | SUBCUTANEOUS | Status: DC
  Administered 2012-10-05: 7 [IU] via SUBCUTANEOUS
  Administered 2012-10-05: 5 [IU] via SUBCUTANEOUS
  Administered 2012-10-06 (×2): 9 [IU] via SUBCUTANEOUS

## 2012-10-05 MED ORDER — FERUMOXYTOL INJECTION 510 MG/17 ML
510.0000 mg | Freq: Once | INTRAVENOUS | Status: AC
  Administered 2012-10-05: 510 mg via INTRAVENOUS
  Filled 2012-10-05: qty 17

## 2012-10-05 MED ORDER — ENOXAPARIN SODIUM 30 MG/0.3ML ~~LOC~~ SOLN
30.0000 mg | SUBCUTANEOUS | Status: DC
  Administered 2012-10-06 – 2012-10-07 (×3): 30 mg via SUBCUTANEOUS
  Filled 2012-10-05 (×5): qty 0.3

## 2012-10-05 NOTE — Progress Notes (Signed)
Echocardiogram 2D Echocardiogram has been performed.  Jack Chung 10/05/2012, 1:07 PM

## 2012-10-05 NOTE — Progress Notes (Signed)
Received pt from [facility].

## 2012-10-05 NOTE — Progress Notes (Signed)
Patient ID: Jack Chung, male   DOB: 01/05/1941, 72 y.o.   MRN: [id number] [facility] PRO[facility]ESS [facility]  Jack Chung UXL:[id number] DOB: Oct 19, 1940 DOA: 10/04/2012 PCP: [name], MD  Brief narrative: Patient is 72 year old male with history of hypertension, diabetes mellitus type 2, congestive heart failure, anemia, coronary artery disease and status post CABG with subsequent complication by MRSA infection requiring prolonged IV ABX, sleep apnea, chronic kidney disease and requiring hemodialysis in 2011 for 3 months, per wife's report kidney function has improved and hemodialysis was discontinued (she thinks his baseline creatinine is ~ 3, he has not established care with nephrologist in [place]). Patient has moved from [place] recently in July 2013 and has now presented to [facility] long emergency department with main concern of progressively worsening generalized weakness, associated with one week duration of intermittent episodes of nausea, nonbloody vomiting and nonbloody diarrhea, early satiety, poor oral intake, significant peripheral edema. Per wife's report, several hours prior to admission she has found him unresponsive at home and has checked his blood sugars but it was undetectable in the machine. She gave him Glucotrol under his tongue and when EMS arrived his sugar level was 45 and blood pressure was 70/30. In addition she reports very low blood temperature when EMS checked it but she is not sure what was the exact reading. Of note, patient's wife brought medical records with her and review of records shows that last creatinine 07/27/2012 was 4.69, Hg 11.3, TSH within normal limits.   Emergency department, station found to be lethargic, somewhat difficult to arouse, hypotensive with systolic blood pressure in 80s to 90s, hypothermia T 93.2 F, heart rate in 40s. [facility] asked to admit for further evaluation.  Principal Problem:   Encephalopathy, metabolic - unclear etiology at this  time, pt somewhat clinically better but still lethargic on exam this AM - possibly multifactorial, hypoglycemia due to poor oral intake, hypotensive shock, dehydration  - sugar levels stable over 24 hour period, BP stable as well - will transfer pt to [facility] hospital as he is still rather lethargic, even though vital signs and electrolyte panel stable - keep in SDU, TSH pending Active Problems:   Hypotensive shock - BP improved, continue to hold home medications: Metoprolol, Lasix. Lisinopril, Norvasc - lactic acid and procalcitonin level both unremarkable  - continue stress steroids for now   Bradycardia - HR improved, continue to hole Metoprolol for now and will likely be abel to resume in 24 hours if vitals are stable    Hypothermia - likely related to hypotensive shock, no improved and stable, no clear infectious etiology as UA and CXR are relatively unremarkable, lactic acid and procalcitonin stable - monitor in SDU   CKD (chronic kidney disease) - creatinine remains stable - appreciate renal input - continue to hold Lisinopril and Lasix to due soft BP - will follow up on nephrology recommendations   Hypoglycemia in pt with diabetes mellitus type II - pt;s home regimen includes Lanuts 45 units and insulin novolog - will hold both for now and will only provide SSI due to hypoglycemia and poor oral intake  - will consider restarting Lantus once pt's oral intake improves, and CBG's more stable  - follow up on A1C   Anemia of chronic disease - slight drop in Hg since admission, pt has received total of 1 L NS in ED and has been on IVF 150 cc/hr for 12 hours - continue to monitor Hg/Hct - check anemia panel   Consultants:  Nephrology  PCCM  Procedures/Studies: Dg Chest Portable 1 View --> 10/04/2012 Cardiomegaly without failure.    Antibiotics:  None  Code Status: Full Family Communication: Pt and wife at bedside Disposition Plan: will plan to transfer the patient to step  down unit [facility] hospital  HPI/Subjective: No events overnight.   Objective: Filed Vitals:   10/05/12 0000 10/05/12 0200 10/05/12 0400 10/05/12 0600  BP: 120/53 107/60 120/57 116/66  Pulse: 64 61 66 69  Temp: 98.5 F (36.9 C)  98.5 F (36.9 C)   TempSrc: Oral  Oral   Resp: 19 18 14 16   Height:      Weight:      SpO2: 96% 93% 94% 94%    Intake/Output Summary (Last 24 hours) at 10/05/12 0741 Last data filed at 10/05/12 0600  Gross per 24 hour  Intake    600 ml  Output    300 ml  Net    300 ml    Exam:   General:  Pt is somewhat lethargic but follows commands appropriately, not in acute distress  Cardiovascular: Regular rate and rhythm but very distant heart sounds, scars sternal area from CABG  Respiratory: Clear to auscultation bilaterally, no wheezing, decreased breath sounds at bases   Abdomen: Soft, non tender, non distended, bowel sounds present, no guarding  Extremities: pulses DP and PT palpable bilaterally  Neuro: Grossly nonfocal  Data Reviewed: Basic Metabolic Panel:  Recent Labs Lab 10/04/12 1732 10/04/12 2301 10/05/12 0340  NA 135  --  136  K 3.7  --  4.3  CL 96  --  97  CO2 24  --  27  GLUCOSE 114*  --  165*  BUN 75*  --  76*  CREATININE 4.59* 4.68* 4.60*  CALCIUM 9.1  --  8.4  MG  --   --  2.2  PHOS  --   --  5.1*   Liver Function Tests:  Recent Labs Lab 10/04/12 1732 10/05/12 0340  AST 19 17  ALT 12 10  ALKPHOS 95 90  BILITOT 0.1* 0.2*  PROT 7.0 6.6  ALBUMIN 3.4* 3.3*   CBC:  Recent Labs Lab 10/04/12 1732 10/04/12 2301 10/05/12 0340  WBC 7.5 7.5 7.7  NEUTROABS 5.3  --   --   HGB 10.6* 9.9* 9.7*  HCT 31.4* 30.1* 29.9*  MCV 89.7 89.9 89.8  PLT 289 261 270   Cardiac Enzymes:  Recent Labs Lab 10/04/12 2301 10/05/12 0340  TROPONINI <0.30 <0.30   CBG:  Recent Labs Lab 10/04/12 2052 10/05/12 0020 10/05/12 0217 10/05/12 0308 10/05/12 0521  GLUCAP 150* 141* 159* 158* 162*    Recent Results (from the  past 240 hour(s))  MRSA PCR SCREENING     Status: None   Collection Time    10/04/12  9:47 PM      Result Value Range Status   MRSA by PCR NEGATIVE  NEGATIVE Final   Comment:            The GeneXpert MRSA Assay (FDA     approved for NASAL specimens     only), is one component of a     comprehensive MRSA colonization     surveillance program. It is not     intended to diagnose MRSA     infection nor to guide or     monitor treatment for     MRSA infections.     Scheduled Meds: . aspirin  325 mg Oral Daily  . enoxaparin  injection  30 mg Subcutaneous Q24H  . hydrocortisone  succ inj  100 mg Intravenous Q6H  . levothyroxine  112 mcg Oral QAC breakfast   Continuous Infusions: . dextrose 5 % and 0.45% NaCl 75 mL/hr at 10/04/12 2230   Debbora Presto, MD  [facility] Pager [phone]  If 7PM-7AM, please contact night-coverage [email or web address] Password [email or web address] 10/05/2012, 7:41 AM   LOS: 1 day

## 2012-10-05 NOTE — Consult Note (Signed)
[facility]        RENAL CONSULT   Reason for Consult: Abnormal renal function in patient admitted with hypoglycemia Referring Physician: Dr. Izola Price  HPI: Jack Chung is a 72 y.o. white man admitted yesterday with hypoglycemia. He has past history of chronic kidney disease. He was hospitalized in 2012 in [place]. [place], [place] and (according to his wife) required dialysis for 3 months. He was taken care of by Dr. Marlane Mingle ([facility] phone [phone]).  Records from his office indicate he had creatinine of 2.2 in January 2013--last time he was seen there. . His primary care physician is Dr. Roslynn Amble [facility]. [place]--phone [phone]). His last contact with Jack Chung was June 2013 at which time CR was 2.97. He has been seen by Dr. Kirtland Bouchard [facility])-Cr in February of this year (according to wife) was 4.69. Renal consult was requested by Dr. Izola Price .    Past Medical History  Diagnosis Date  . Hypertension   . Diabetes mellitus   . Chronic kidney disease     was on dialysis in July 2011  . Hypothyroidism   . CHF (congestive heart failure)   . Anemia   . Blind left eye   . Sleep apnea     not on CPAP  . 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 COPD Mother   . COPD Father   1 son with DM, 1 daughter -unknown health.  5 siblings--all dead from accidents except one who died of tetanus.    Social History: Born and grew up in [place]. [place], [place], graduated from high school, Lives with 2nd wife, married 34 yr.  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  No cigarettes, no EtOH  Allergies:  Allergies  Allergen Reactions  . Flu Virus Vaccine     Gets the flu  . Lipitor (Atorvastatin)     Leg cramps     Medications:  I have reviewed the patient's current medications. Prior to Admission:  Prescriptions prior to admission  Medication Sig Dispense Refill  . acidophilus (RISAQUAD) CAPS Take 1 capsule by mouth 2 (two) times daily.      Marland Kitchen allopurinol (ZYLOPRIM) 100 MG tablet Take 100 mg by mouth 3 (three) times daily.       Marland Kitchen amLODipine (NORVASC) 10 MG tablet Take 10 mg by mouth daily.      Marland Kitchen aspirin 325 MG tablet Take 325 mg by mouth daily.      . Calcium Carbonate-Vitamin D (CALCIUM + D PO) Take 1 tablet by mouth 2 (two) times daily.      . Cranberry 300 MG tablet Take 300 mg by mouth 2 (two) times daily.      . ferrous fumarate (HEMOCYTE - 106 MG FE) 325 (106 FE) MG TABS Take 1 tablet by mouth.      . furosemide (LASIX) 40 MG tablet Take 40 mg by mouth 2 (two) times daily.      . insulin aspart (NOVOLOG) 100 UNIT/ML injection Inject 2-10 Units into the skin 3 (three) times daily before meals.      . insulin glargine (LANTUS) 100 UNIT/ML injection Inject 35 Units into the skin every morning.      Marland Kitchen levothyroxine (SYNTHROID, LEVOTHROID) 112 MCG tablet Take 112 mcg by mouth daily.      Marland Kitchen  lisinopril (PRINIVIL,ZESTRIL) 40 MG tablet Take 40 mg by mouth daily.      . magnesium oxide (MAG-OX) 400 MG tablet Take 400 mg by mouth daily.      . metoprolol (LOPRESSOR) 100 MG tablet Take 100 mg by mouth 2 (two) times daily.      . predniSONE (DELTASONE) 1 MG tablet Take 1 mg by mouth 2 (two) times daily.       Scheduled: . aspirin  325 mg Oral Daily  . enoxaparin (LOVENOX) injection  30 mg Subcutaneous Q24H  . ferumoxytol  510 mg Intravenous Once  . hydrocortisone sod succinate (SOLU-CORTEF) inj  100 mg Intravenous Q6H  . insulin aspart  0-9 Units Subcutaneous TID WC  . levothyroxine  112 mcg Oral QAC breakfast  . sodium chloride  3 mL Intravenous Q12H    ROS- no angina, no claudication, no melena, no hematochezia, no gross hematuria, + diarrhea, + decreased appetite last 4-6 weeks, no renal  colic, + decreased force of urinary stream, no nocturia, + doe, no orthopnea, +edema, no purulent sputum, no hemoptysis, no weight gain or weight loss, no cold or heat intolerance.  Physical Exam Blood pressure 136/61, pulse 69, temperature 98.1 F (36.7 C), temperature source Oral, resp. rate 16, height 5\' 10"  (1.778 m), weight 111.6 kg (246 lb 0.5 oz), SpO2 100.00%. General-awake, alert, courteous,  friendly Chest-sternotomy with large sternal depression (4-5 cm) from debridement (? MRSA mediastinitis), clear to auscultation Heart-no rub Abd-protuberan,t nontender, no organs or masses are felt, bowel sounds present, no bruits are heard Extr-1+ pretibial edema Neuro- right-handed, blind left eye, decreased sensation in feet  Lab- Hemoglobin 9.7, WBC 7700, PLTS 270k, FE/TIBC 21 percent saturation, ferritin 194  Sodium 136, potassium 4.3, chloride 97, CO2 27, glucose 217, BUN 76, CR 4.6, calcium 8.4, albumin 3.3. Hemoglobin A1c 9.2  UA-pH 5, SG 1.013, glucose, ketones, nitrite, LE, blood-all negative, protein 1+  CXR-cardiomegaly, no CHF  Assessment/Plan: 1. 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 glucose intolerance/hypoglycemia, C.  probable nephropathy--per primary care.  Watch glucose on hydrocortisone.   2. High BP--BP not high now 3. CKD--4-check renal ultrasound, SPEP, UPEP, save left arm for vascular access, vein mapping, check PTH, first void urine for protein/creatinine ratio. 4. Anemia-iron studies indicate iron deficiency.  Will give IV iron, then begin Aranesp 5. Bradycardia--HR 60-70 today 6. Past hx sarcoidosis--no hilar adenopathy on current CXR 7. Gout--had been  on allopurinol and prednisone.  Check uric acid.  OK with me to restart allopurinol if uric acid > 6. 8. Hypothyroidism--continue meds (L-thyroxine 0.112 mg/d)   Loriana Samad F 10/05/2012, 4:21 PM

## 2012-10-05 NOTE — Plan of Care (Signed)
Problem: Consults Goal: Diabetes Guidelines if Diabetic/Glucose > 140 If diabetic or lab glucose is > 140 mg/dl - Initiate Diabetes/Hyperglycemia Guidelines & Document Interventions  Outcome: Completed/Met Date Met:  10/05/12 Pt's CBG had been low, now in the 140s to 150s.

## 2012-10-05 NOTE — Progress Notes (Signed)
CARE MANAGEMENT NOTE 10/05/2012  Patient:  Jack Chung,Jack Chung   Account Number:  [id number]  Date Initiated:  10/05/2012  Documentation initiated by:  Jaevian Shean  Subjective/Objective Assessment:   pt with multiple problems, hypoglycemia, hypertensive,bun of 75, creat. 4.56 low urinary lowput, a.flutter     Action/Plan:   from home is normally indep. in his adls   Anticipated DC Date:  10/08/2012   Anticipated DC Plan:  HOME/SELF CARE  In-house referral  NA      DC Planning Services  NA      [facility] Choice  NA   Choice offered to / List presented to:  NA   DME arranged  NA      DME agency  NA     HH arranged  NA      HH agency  NA   Status of service:  In process, will continue to follow Medicare Important Message given?  NA - LOS <3 / Initial given by admissions (If response is "NO", the following Medicare IM given date fields will be blank) Date Medicare IM given:   Date Additional Medicare IM given:    Discharge Disposition:    Per UR Regulation:  Reviewed for med. necessity/level of care/duration of stay  If discussed at Long Length of Stay Meetings, dates discussed:    Comments:  [id number] Earlene Plater, RN, BSN, CCM:  CHART REVIEWED AND UPDATED.  Next chart review due on [id number]. NO DISCHARGE NEEDS PRESENT AT THIS TIME. CASE MANAGEMENT [phone]

## 2012-10-05 NOTE — Progress Notes (Signed)
Called report to [facility] 2600. Report given to [facility]. Will arrange for transportation via [facility].

## 2012-10-06 DIAGNOSIS — N19 Unspecified kidney failure: Secondary | ICD-10-CM

## 2012-10-06 LAB — RENAL FUNCTION PANEL
Albumin: 3.4 g/dL — ABNORMAL LOW (ref 3.5–5.2)
BUN: 85 mg/dL — ABNORMAL HIGH (ref 6–23)
Calcium: 8.5 mg/dL (ref 8.4–10.5)
Creatinine, Ser: 5.17 mg/dL — ABNORMAL HIGH (ref 0.50–1.35)
Phosphorus: 5 mg/dL — ABNORMAL HIGH (ref 2.3–4.6)

## 2012-10-06 LAB — CBC
HCT: 28.3 % — ABNORMAL LOW (ref 39.0–52.0)
MCH: 29.8 pg (ref 26.0–34.0)
MCV: 88.7 fL (ref 78.0–100.0)
Platelets: 264 10*3/uL (ref 150–400)
RDW: 13.5 % (ref 11.5–15.5)

## 2012-10-06 LAB — GLUCOSE, CAPILLARY
Glucose-Capillary: 366 mg/dL — ABNORMAL HIGH (ref 70–99)
Glucose-Capillary: 367 mg/dL — ABNORMAL HIGH (ref 70–99)

## 2012-10-06 MED ORDER — ALLOPURINOL 100 MG PO TABS
100.0000 mg | ORAL_TABLET | Freq: Three times a day (TID) | ORAL | Status: DC
  Administered 2012-10-06 – 2012-10-08 (×6): 100 mg via ORAL
  Filled 2012-10-06 (×8): qty 1

## 2012-10-06 MED ORDER — INSULIN ASPART 100 UNIT/ML ~~LOC~~ SOLN
0.0000 [IU] | Freq: Every day | SUBCUTANEOUS | Status: DC
  Administered 2012-10-06: 4 [IU] via SUBCUTANEOUS
  Administered 2012-10-07: 3 [IU] via SUBCUTANEOUS

## 2012-10-06 MED ORDER — INSULIN ASPART 100 UNIT/ML ~~LOC~~ SOLN
0.0000 [IU] | Freq: Three times a day (TID) | SUBCUTANEOUS | Status: DC
  Administered 2012-10-06: 20 [IU] via SUBCUTANEOUS
  Administered 2012-10-07 – 2012-10-08 (×4): 7 [IU] via SUBCUTANEOUS
  Administered 2012-10-08: 4 [IU] via SUBCUTANEOUS

## 2012-10-06 MED ORDER — OXYCODONE HCL 5 MG PO TABS: 5.0000 mg | ORAL_TABLET | ORAL | Status: DC | PRN

## 2012-10-06 MED ORDER — METOPROLOL TARTRATE 50 MG PO TABS
50.0000 mg | ORAL_TABLET | Freq: Two times a day (BID) | ORAL | Status: DC
  Filled 2012-10-06: qty 1

## 2012-10-06 MED ORDER — INSULIN GLARGINE 100 UNIT/ML ~~LOC~~ SOLN
28.0000 [IU] | Freq: Every day | SUBCUTANEOUS | Status: DC
  Administered 2012-10-07: 28 [IU] via SUBCUTANEOUS
  Filled 2012-10-06: qty 0.28

## 2012-10-06 MED ORDER — HYDROCORTISONE SOD SUCCINATE 100 MG IJ SOLR
50.0000 mg | Freq: Two times a day (BID) | INTRAMUSCULAR | Status: DC
  Administered 2012-10-06 – 2012-10-07 (×2): 50 mg via INTRAVENOUS
  Filled 2012-10-06 (×4): qty 1

## 2012-10-06 MED ORDER — METOPROLOL TARTRATE 50 MG PO TABS
50.0000 mg | ORAL_TABLET | Freq: Two times a day (BID) | ORAL | Status: DC
  Administered 2012-10-06 – 2012-10-08 (×4): 50 mg via ORAL
  Filled 2012-10-06 (×5): qty 1

## 2012-10-06 NOTE — Progress Notes (Signed)
Patient evaluated for long-term disease management services with [facility] as a benefit of his Blue Medicare insurance. Met with wife and patient at bedside to explain services. Consents signed and confirmed contact information. Patient will receive a post discharge transition of care call and will be evaluated for monthly home visits for assessments and for education. Left [facility] Care [facility] packet and contact information with wife.      Essense Bousquet, MSN-Ed, RN,BSN, [facility] Hospital Liaison, [phone] 

## 2012-10-06 NOTE — Progress Notes (Signed)
Patient arrived to floor without complaints.  Patient is alert and oriented.  Wife is at bedside. Patient oriented to room and call bell system.  Patient asked to call with any needs.

## 2012-10-06 NOTE — Consult Note (Signed)
[facility] [place] CONSULT NOTE 10/06/2012 DOB: [id number] MRN : [id number]  CC: CKD Referring Physician:Joseph A Coladonato, MD   History of Present Illness: Jack Chung is a 72 y.o. male  has a past medical history of Hypertension; Diabetes mellitus; Chronic kidney disease; Hypothyroidism; CHF (congestive heart failure); Anemia; Blind left eye; Sleep apnea; and Coronary artery disease. He has a history of CKD and was on dialysis short term in 2011.  We have been asked to create dialysis access for future dialysis needs.  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        Past Medical History  Diagnosis Date  . Hypertension   . Diabetes mellitus   . Chronic kidney disease     was on dialysis in July 2011  . Hypothyroidism   . CHF (congestive heart failure)   . Anemia   . Blind left eye   . Sleep apnea     not on CPAP  . Coronary artery disease     Past Surgical History  Procedure Laterality Date  . Cardiac surgery      total of 6 surgeries, 5 related to mrsa  . Coronary artery bypass graft  06/2009     ROS: [x]  Positive  [ ]  Denies    General: [ ]  Weight loss, [ ]  Fever, [ ]  chills Neurologic: [ ]  Dizziness, [ ]  Blackouts, [ ]  Seizure [ ]  Stroke, [ ]  "Mini stroke", [ ]  Slurred speech, [ ]  Temporary blindness; [ ]  weakness in arms or legs, [ ]  Hoarseness [x]  partial blindness in left eye Cardiac: [ ]  Chest pain/pressure, [ ]  Shortness of breath at rest [x ] Shortness of breath with exertion, [ ]  Atrial fibrillation or irregular heartbeat Vascular: [ ]  Pain in legs with walking, [ ]  Pain in legs at rest, [ ]  Pain in legs at night,  [ ]  Non-healing ulcer, [ ]  Blood clot in vein/DVT,   Pulmonary: [ ]  Home oxygen, [ ]  Productive cough, [ ]  Coughing up blood, [ ]  Asthma,  [ ]  Wheezing Musculoskeletal:  [x ] Arthritis, [ ]  Low back pain, [ ]  Joint pain [x]  peripheral neuropathy bilateral feet Hematologic: [ ]  Easy Bruising, [ ]  Anemia; [ ]   Hepatitis Gastrointestinal: [ ]  Blood in stool, [ ]  Gastroesophageal Reflux/heartburn, [ ]  Trouble swallowing Urinary: [x ] chronic Kidney disease, [ ]  on HD - [ ]  MWF or [ ]  TTHS, [ ]  Burning with urination, [ ]  Difficulty urinating Skin: [ ]  Rashes, [ ]  Wounds Psychological: [ ]  Anxiety, [ ]  Depression  Social History History  Substance Use Topics  . Smoking status: Never Smoker   . Smokeless tobacco: Never Used  . Alcohol Use: No    Family History Family History  Problem Relation Age of Onset  . COPD Mother   . COPD Father     Allergies  Allergen Reactions  . Flu Virus Vaccine     Gets the flu  . Lipitor (Atorvastatin)     Leg cramps    Current Facility-Administered Medications  Medication Dose Route Frequency Provider Last Rate Last Dose  . acetaminophen (TYLENOL) tablet 650 mg  650 mg Oral Q6H PRN Therisa Doyne, MD       Or  . acetaminophen (TYLENOL) suppository 650 mg  650 mg Rectal Q6H PRN Therisa Doyne, MD      . aspirin tablet 325 mg  325 mg Oral Daily Therisa Doyne, MD  325 mg at 10/06/12 0916  . dextrose (GLUTOSE) 40 % oral gel 37.5 g  1 Tube Oral PRN Therisa Doyne, MD      . enoxaparin (LOVENOX) injection 30 mg  30 mg Subcutaneous Q24H Berkley Harvey, RPH   30 mg at 10/06/12 0009  . HYDROcodone-acetaminophen (NORCO/VICODIN) 5-325 MG per tablet 1-2 tablet  1-2 tablet Oral Q4H PRN Therisa Doyne, MD      . hydrocortisone sodium succinate (SOLU-CORTEF) 100 mg/2 mL injection 100 mg  100 mg Intravenous Q6H Dione Booze, MD      . insulin aspart (novoLOG) injection 0-9 Units  0-9 Units Subcutaneous TID WC Dorothea Ogle, MD   9 Units at 10/06/12 0830  . levothyroxine (SYNTHROID, LEVOTHROID) tablet 112 mcg  112 mcg Oral QAC breakfast Therisa Doyne, MD   112 mcg at 10/06/12 0830  . ondansetron (ZOFRAN) tablet 4 mg  4 mg Oral Q6H PRN Therisa Doyne, MD       Or  . ondansetron (ZOFRAN) injection 4 mg  4 mg Intravenous Q6H PRN  Therisa Doyne, MD      . sodium chloride 0.9 % injection 3 mL  3 mL Intravenous Q12H Therisa Doyne, MD   3 mL at 10/06/12 1610     Imaging: US Renal  10/05/2012  *RADIOLOGY REPORT*  Clinical Data: History of elevated creatinine level of 4.5. Evaluate for hydronephrosis.  RENAL/URINARY TRACT ULTRASOUND COMPLETE  Comparison:  None.  Findings:  Right Kidney:  Right renal length is 10.1 cm.  Left Kidney:  Left renal length is 10.9 cm.  There is evidence of chronic medical renal disease with cortical thinning bilaterally with increased echogenicity of the renal parenchyma with decreased differentiation between cortex and medulla.  There is no evidence of hydronephrosis involving either kidney.  No calculus is evident.  There is a small hypoechoic mass involving the upper pole of the left kidney.  This is hypoechoic with a few internal echoes.  There is slight posterior acoustic enhancement on some of the images. This is felt to represent probable small cyst with some debris within it.  A small mass measures 1.0 x 1.3 x 1.1 cm.  Bladder:  No urinary bladder lesion is identified.  IMPRESSION: Normal renal lengths but there is evidence of chronic medical renal disease with cortical thinning bilaterally and increased echogenicity of renal parenchyma with decreased differentiation between cortex and medulla.  There is no evidence of hydronephrosis involving either kidney.  Small hypoechoic mass involving upper pole of the left kidney is hypoechoic with a few internal echoes most likely reflecting small cyst with some internal debris.  No internal solid nodular area was evident.   Original Report Authenticated By: Onalee Hua Call    Dg Chest Portable 1 View  10/04/2012  *RADIOLOGY REPORT*  Clinical Data: Hypoglycemia.  Weakness.  PORTABLE CHEST - 1 VIEW  Comparison: None.  Findings: The cardiopericardial silhouette is enlarged.  Single median sternotomy wire is present. Monitoring leads are projected over the  chest.  No airspace disease.  No effusion.  Suboptimal inspiration.  Basilar atelectasis.  IMPRESSION: Cardiomegaly without failure.   Original Report Authenticated By: Andreas Newport, M.D.     Significant Diagnostic Studies: CBC Lab Results  Component Value Date   WBC 7.8 10/06/2012   HGB 9.5* 10/06/2012   HCT 28.3* 10/06/2012   MCV 88.7 10/06/2012   PLT 264 10/06/2012    BMET    Component Value Date/Time   NA 132* 10/06/2012 0440   K  4.9 10/06/2012 0440   CL 93* 10/06/2012 0440   CO2 24 10/06/2012 0440   GLUCOSE 400* 10/06/2012 0440   BUN 85* 10/06/2012 0440   CREATININE 5.17* 10/06/2012 0440   CALCIUM 8.5 10/06/2012 0440   GFRNONAA 10* 10/06/2012 0440   GFRAA 12* 10/06/2012 0440    COAG No results found for this basename: INR, PROTIME   No results found for this basename: PTT     Physical Examination BP Readings from Last 3 Encounters:  10/06/12 128/60  01/21/12 155/77   Temp Readings from Last 3 Encounters:  10/06/12 98.2 F (36.8 C) Oral  01/21/12 98.6 F (37 C) Oral   SpO2 Readings from Last 3 Encounters:  10/06/12 96%  01/21/12 97%   Pulse Readings from Last 3 Encounters:  10/06/12 83  01/21/12 73    General:  WDWN in NAD Gait: Normal HENT: WNL Eyes: Pupils equal Pulmonary: normal non-labored breathing , without Rales, rhonchi,  wheezing Cardiac: RRR, without  Murmurs, rubs or gallops; Well healed scar central chest s/p Coronary by-pass No carotid bruits Abdomen: soft, NT, no masses Skin: no rashes, ulcers noted Vascular Exam/Pulses:palpable radial bilaterally, IV have been started in both arm.  Extremities without ischemic changes, no Gangrene , no cellulitis; no open wounds;  Musculoskeletal: no muscle wasting or atrophy  Neurologic: A&O X 3; Appropriate Affect ;  SENSATION: normal; MOTOR FUNCTION: Pt has good and equal strength in all extremities - 5/5 Speech is fluent/normal  Non-Invasive Vascular Imaging: Pending vein mapping  ASSESSMENT/PLAN: CKD not  requiring dialysis currently Right arm AV fistula verses graft Monday.  IF d/c'd before then we can do this as an outpatient procedure.   Clinton Gallant [facility] 10/06/2012 10:56 AM

## 2012-10-06 NOTE — Progress Notes (Signed)
Patient ID: Jack Chung, male   DOB: 1940/12/14, 72 y.o.   MRN: [id number] S:feels better O:BP 128/60  Pulse 83  Temp(Src) 98.2 F (36.8 C) (Oral)  Resp 16  Ht 5\' 10"  (1.778 m)  Wt 111.6 kg (246 lb 0.5 oz)  BMI 35.3 kg/m2  SpO2 96%  Intake/Output Summary (Last 24 hours) at 10/06/12 0952 Last data filed at 10/06/12 0535  Gross per 24 hour  Intake    150 ml  Output    725 ml  Net   -575 ml   Intake/Output: I/O last 3 completed shifts: In: 975 [I.V.:975] Out: 1300 [Urine:1300]  Intake/Output this shift:    Weight change: 1.8 kg (3 lb 15.5 oz) Gen:WD WN obese WM in NAD CVS:no rub Resp:CTA WUJ:WJXBJ, NT Ext:+edema   Recent Labs Lab 10/04/12 1732 10/04/12 2301 10/05/12 0340 10/06/12 0440  NA 135  --  136 132*  K 3.7  --  4.3 4.9  CL 96  --  97 93*  CO2 24  --  27 24  GLUCOSE 114*  --  165* 400*  BUN 75*  --  76* 85*  CREATININE 4.59* 4.68* 4.60* 5.17*  ALBUMIN 3.4*  --  3.3* 3.4*  CALCIUM 9.1  --  8.4 8.5  PHOS  --   --  5.1* 5.0*  AST 19  --  17  --   ALT 12  --  10  --    Liver Function Tests:  Recent Labs Lab 10/04/12 1732 10/05/12 0340 10/06/12 0440  AST 19 17  --   ALT 12 10  --   ALKPHOS 95 90  --   BILITOT 0.1* 0.2*  --   PROT 7.0 6.6  --   ALBUMIN 3.4* 3.3* 3.4*   No results found for this basename: LIPASE, AMYLASE,  in the last 168 hours No results found for this basename: AMMONIA,  in the last 168 hours CBC:  Recent Labs Lab 10/04/12 1732 10/04/12 2301 10/05/12 0340 10/06/12 0440  WBC 7.5 7.5 7.7 7.8  NEUTROABS 5.3  --   --   --   HGB 10.6* 9.9* 9.7* 9.5*  HCT 31.4* 30.1* 29.9* 28.3*  MCV 89.7 89.9 89.8 88.7  PLT 289 261 270 264   Cardiac Enzymes:  Recent Labs Lab 10/04/12 2301 10/05/12 0340 10/05/12 1020  TROPONINI <0.30 <0.30 <0.30   CBG:  Recent Labs Lab 10/05/12 0757 10/05/12 1137 10/05/12 1548 10/06/12 0007 10/06/12 0749  GLUCAP 187* 279* 308* 345* 366*    Iron Studies:  Recent Labs  10/05/12 0340  IRON  55  TIBC 262  FERRITIN 194   Studies/Results: US Renal  10/05/2012  *RADIOLOGY REPORT*  Clinical Data: History of elevated creatinine level of 4.5. Evaluate for hydronephrosis.  RENAL/URINARY TRACT ULTRASOUND COMPLETE  Comparison:  None.  Findings:  Right Kidney:  Right renal length is 10.1 cm.  Left Kidney:  Left renal length is 10.9 cm.  There is evidence of chronic medical renal disease with cortical thinning bilaterally with increased echogenicity of the renal parenchyma with decreased differentiation between cortex and medulla.  There is no evidence of hydronephrosis involving either kidney.  No calculus is evident.  There is a small hypoechoic mass involving the upper pole of the left kidney.  This is hypoechoic with a few internal echoes.  There is slight posterior acoustic enhancement on some of the images. This is felt to represent probable small cyst with some debris within it.  A small mass measures  1.0 x 1.3 x 1.1 cm.  Bladder:  No urinary bladder lesion is identified.  IMPRESSION: Normal renal lengths but there is evidence of chronic medical renal disease with cortical thinning bilaterally and increased echogenicity of renal parenchyma with decreased differentiation between cortex and medulla.  There is no evidence of hydronephrosis involving either kidney.  Small hypoechoic mass involving upper pole of the left kidney is hypoechoic with a few internal echoes most likely reflecting small cyst with some internal debris.  No internal solid nodular area was evident.   Original Report Authenticated By: Onalee Hua Call    Dg Chest Portable 1 View  10/04/2012  *RADIOLOGY REPORT*  Clinical Data: Hypoglycemia.  Weakness.  PORTABLE CHEST - 1 VIEW  Comparison: None.  Findings: The cardiopericardial silhouette is enlarged.  Single median sternotomy wire is present. Monitoring leads are projected over the chest.  No airspace disease.  No effusion.  Suboptimal inspiration.  Basilar atelectasis.  IMPRESSION:  Cardiomegaly without failure.   Original Report Authenticated By: Andreas Newport, M.D.    . aspirin  325 mg Oral Daily  . enoxaparin (LOVENOX) injection  30 mg Subcutaneous Q24H  . hydrocortisone sod succinate (SOLU-CORTEF) inj  100 mg Intravenous Q6H  . insulin aspart  0-9 Units Subcutaneous TID WC  . levothyroxine  112 mcg Oral QAC breakfast  . sodium chloride  3 mL Intravenous Q12H    BMET    Component Value Date/Time   NA 132* 10/06/2012 0440   K 4.9 10/06/2012 0440   CL 93* 10/06/2012 0440   CO2 24 10/06/2012 0440   GLUCOSE 400* 10/06/2012 0440   BUN 85* 10/06/2012 0440   CREATININE 5.17* 10/06/2012 0440   CALCIUM 8.5 10/06/2012 0440   GFRNONAA 10* 10/06/2012 0440   GFRAA 12* 10/06/2012 0440   CBC    Component Value Date/Time   WBC 7.8 10/06/2012 0440   RBC 3.19* 10/06/2012 0440   HGB 9.5* 10/06/2012 0440   HCT 28.3* 10/06/2012 0440   PLT 264 10/06/2012 0440   MCV 88.7 10/06/2012 0440   MCH 29.8 10/06/2012 0440   MCHC 33.6 10/06/2012 0440   RDW 13.5 10/06/2012 0440   LYMPHSABS 1.0 10/04/2012 1732   MONOABS 0.6 10/04/2012 1732   EOSABS 0.6 10/04/2012 1732   BASOSABS 0.1 10/04/2012 1732     Assessment/Plan:  1. 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 glucose intolerance/hypoglycemia, C. probable nephropathy--per primary care. Watch glucose on hydrocortisone.  2. High BP--BP not high now 3. CKD--4/5-check renal ultrasound, SPEP, UPEP, save left arm for vascular access, vein mapping, check PTH, first void urine for protein/creatinine ratio. 1. Pt is amenable to vascular access placement.  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 He and his wife are interested in home hemo. 3. Will educate on dialysis 4. Anemia-iron studies indicate iron deficiency. Will give IV iron, then begin Aranesp 5. Bradycardia--HR 60-70 today 6. Past hx sarcoidosis--no hilar adenopathy on current CXR 7. Gout--had been on allopurinol and prednisone. Check uric acid. OK with me  to restart allopurinol if uric acid > 6. 8. Hypothyroidism--continue meds (L-thyroxine 0.112 mg/d) 9. Disposition- not yet stable for d/c in light of rising Scr  Jalisa Sacco A

## 2012-10-06 NOTE — Progress Notes (Signed)
VASCULAR LAB PRELIMINARY  PRELIMINARY  PRELIMINARY  PRELIMINARY  Right  Upper Extremity Vein Map    Cephalic  Segment Diameter Depth Comment  1. Axilla 2.28 mm mm   2. Mid upper arm 3.48 mm mm   3. Above AC 4.67 mm mm   4. In AC 5.91 mm mm   5. Below AC 2.78 mm mm Median cubital vein  4.12 mm  6. Mid forearm 2.25 mm mm Median cubital vein  3.82 mm  7. Wrist mm mm Median cubital vein  4.07 mm   mm mm    mm mm    mm mm    Basilic  Segment Diameter Depth Comment  1. Axilla mm mm   2. Mid upper arm 6.49 mm mm   3. Above AC 5.69 mm mm   4. In AC mm mm   5. Below AC mm mm   6. Mid forearm 2.58 mm mm Wraps around to the back of the forearm  7. Wrist mm mm    mm mm    mm mm    mm mm    Left Upper Extremity Vein Map    Cephalic  Segment Diameter Depth Comment  1. Axilla mm mm Too small to measure in the upper arm  2. Mid upper arm mm mm   3. Above AC mm mm   4. In AC 6.69 mm mm   5. Below AC 2.29 mm mm   6. Mid forearm 2.16 mm mm   7. Wrist mm mm    mm mm    mm mm    mm mm    Basilic  Segment Diameter Depth Comment  1. Axilla mm mm   2. Mid upper arm 6.55 mm mm   3. Above AC 5.43 mm mm   4. In AC mm mm   5. Below AC 3.74 mm mm   6. Mid forearm 2.58 mm mm   7. Wrist 2.45 mm mm    mm mm    mm mm    mm mm    Naimah Yingst, RVT 10/06/2012, 1:18 PM

## 2012-10-06 NOTE — Progress Notes (Addendum)
Inpatient Diabetes Program Recommendations  AACE/ADA: New Consensus Statement on Inpatient Glycemic Control (2013)  Target Ranges:  Prepandial:   less than 140 mg/dL      Peak postprandial:   less than 180 mg/dL (1-2 hours)      Critically ill patients:  140 - 180 mg/dL  Results for KENDRA, WOOLFORD (MRN [id number]) as of 10/06/2012 13:33  Ref. Range 10/05/2012 11:37 10/05/2012 15:48 10/06/2012 00:07 10/06/2012 07:49 10/06/2012 12:17  Glucose-Capillary Latest Range: 70-99 mg/dL 782 (H) 956 (H) 213 (H) 366 (H) 367 (H)   Recommend starting at least 1/2 home dose basal insulin.  Thank you  Piedad Climes BSN, RN,CDE Inpatient Diabetes Coordinator [phone] (team pager)

## 2012-10-06 NOTE — Progress Notes (Signed)
[facility] Progress Note [place] TEAM 1 - Stepdown/ICU TEAM   Kandice Moos WUJ:[id number] DOB: 23-Nov-1940 DOA: 10/04/2012 PCP: [name], MD  Brief narrative: 72 year old male with history of hypertension, diabetes mellitus type 2, congestive heart failure, anemia, coronary artery disease and status post CABG with subsequent complication by MRSA infection requiring prolonged IV ABX, sleep apnea, chronic kidney disease requiring hemodialysis in 2011 for 3 months, per wife's report kidney function had improved and hemodialysis was discontinued (she thinks his baseline creatinine is ~ 3, he has not established care with nephrologist in [place]). Patient moved from [place] in July 2013 and presented to [facility] emergency department with main concern of progressive worsening generalized weakness, associated with one week duration of intermittent nausea, nonbloody vomiting and nonbloody diarrhea, early satiety, poor oral intake, significant peripheral edema. Per wife's report, several hours prior to admission she found him unresponsive at home and checked his blood sugar but it was undetectable on the machine. She gave him Glucotrol under his tongue and when EMS arrived his sugar was 45 and blood pressure was 70/30. In addition she reported very low temperature when EMS checked it but she is not sure of the exact reading. Of note, patient's wife brought medical records with her and review of records shows that last creatinine 07/27/2012 was 4.69, Hg 11.3, TSH within normal limits.  In the ED the pt was found to be lethargic/difficult to arouse, hypotensive with systolic blood pressure in 80s to 90s, hypothermic at 93.2 F, heart rate in 40s.    Assessment/Plan:  Encephalopathy, metabolic possibly multifactorial, due to hypoglycemia due to poor oral intake, hypotensive shock, dehydration - resolved at this time  Bradycardia Was on BB at home - currently on hold - heart rate has  recovered - given her history of coronary artery disease will resume BB at low dose and follow  Hypotension w/ hx of HTN no clear infectious etiology as UA and CXR are relatively unremarkable, lactic acid and procalcitonin stable - likely related to poor by mouth intake as well as hypoglycemia - continue to follow trend  Hypoglycemia in DM2 home regimen includes Lanuts 45 units and insulin novolog - further history reveals patient had initially been struggling with severe hyperglycemia and was dosing with aggressive sliding scale insulin followed by 48+ hours of rebound hypoglycemia - CBG is now very poorly controlled with high-dose steroid use - resume Lantus in a.m. and adjust sliding scale  Hypothermia Likely due to severe hypoglycemia - resolved  Anemia - Fe deficient  give IV iron, then begin Aranesp per nephrology  CKD (chronic kidney disease) creatinine of 2.2 in January 2013 - February of this year was 4.69 - care as per Nephrology  Hypothyroid Continue home Synthroid dose - TSH at goal  Gout Is on chronic low-dose steroid plus allopurinol - no acute flare at this time - uric acid is elevated - rapidly wean steroids to baseline dose and resume allopurinol  Blindness OS occurred following surgery for diabetic retinopathy  Code Status: FULL Family Communication: Spoke with patient and wife at bedside Disposition Plan: Stable for transfer to telemetry bed  Consultants: Nephrology  Procedures: 5/1 - TTE - normal study - EF 60-65%  Antibiotics: none  DVT prophylaxis: lovenox  HPI/Subjective: The patient is alert interactive and quite pleasant.  He denies any fevers chills nausea or vomiting at the present time.  He does report poor appetite.  Objective: Blood pressure 120/56, pulse 72, temperature 98.3 F (36.8 C), temperature  source Oral, resp. rate 14, height 5\' 10"  (1.778 m), weight 111.6 kg (246 lb 0.5 oz), SpO2 96.00%.  Intake/Output Summary (Last 24 hours) at  10/06/12 1503 Last data filed at 10/06/12 1300  Gross per 24 hour  Intake    360 ml  Output    400 ml  Net    -40 ml    Exam: General: No acute respiratory distress Lungs: Clear to auscultation bilaterally without wheezes or crackles Cardiovascular: Regular rate and rhythm without murmur gallop or rub normal S1 and S2 Abdomen: Nontender, nondistended, soft, bowel sounds positive, no rebound, no ascites, no appreciable mass Extremities: No significant cyanosis, clubbing, or edema bilateral lower extremities  Data Reviewed: Basic Metabolic Panel:  Recent Labs Lab 10/04/12 1732 10/04/12 2301 10/05/12 0340 10/06/12 0440  NA 135  --  136 132*  K 3.7  --  4.3 4.9  CL 96  --  97 93*  CO2 24  --  27 24  GLUCOSE 114*  --  165* 400*  BUN 75*  --  76* 85*  CREATININE 4.59* 4.68* 4.60* 5.17*  CALCIUM 9.1  --  8.4 8.5  MG  --   --  2.2  --   PHOS  --   --  5.1* 5.0*   Liver Function Tests:  Recent Labs Lab 10/04/12 1732 10/05/12 0340 10/06/12 0440  AST 19 17  --   ALT 12 10  --   ALKPHOS 95 90  --   BILITOT 0.1* 0.2*  --   PROT 7.0 6.6  --   ALBUMIN 3.4* 3.3* 3.4*   CBC:  Recent Labs Lab 10/04/12 1732 10/04/12 2301 10/05/12 0340 10/06/12 0440  WBC 7.5 7.5 7.7 7.8  NEUTROABS 5.3  --   --   --   HGB 10.6* 9.9* 9.7* 9.5*  HCT 31.4* 30.1* 29.9* 28.3*  MCV 89.7 89.9 89.8 88.7  PLT 289 261 270 264   Cardiac Enzymes:  Recent Labs Lab 10/04/12 2301 10/05/12 0340 10/05/12 1020  TROPONINI <0.30 <0.30 <0.30   CBG:  Recent Labs Lab 10/05/12 1137 10/05/12 1548 10/06/12 0007 10/06/12 0749 10/06/12 1217  GLUCAP 279* 308* 345* 366* 367*    Recent Results (from the past 240 hour(s))  MRSA PCR SCREENING     Status: None   Collection Time    10/04/12  9:47 PM      Result Value Range Status   MRSA by PCR NEGATIVE  NEGATIVE Final   Comment:            The GeneXpert MRSA Assay (FDA     approved for NASAL specimens     only), is one component of a      comprehensive MRSA colonization     surveillance program. It is not     intended to diagnose MRSA     infection nor to guide or     monitor treatment for     MRSA infections.  CULTURE, BLOOD (ROUTINE X 2)     Status: None   Collection Time    10/04/12 11:00 PM      Result Value Range Status   Specimen Description BLOOD LEFT ARM   Final   Special Requests BOTTLES DRAWN AEROBIC AND ANAEROBIC 5CC   Final   Culture  Setup Time 10/05/2012 05:02   Final   Culture     Final   Value:        BLOOD CULTURE RECEIVED NO GROWTH TO DATE CULTURE WILL BE  HELD FOR 5 DAYS BEFORE ISSUING A FINAL NEGATIVE REPORT   Report Status PENDING   Incomplete  CULTURE, BLOOD (ROUTINE X 2)     Status: None   Collection Time    10/04/12 11:00 PM      Result Value Range Status   Specimen Description BLOOD RIGHT ARM   Final   Special Requests BOTTLES DRAWN AEROBIC AND ANAEROBIC 3CC   Final   Culture  Setup Time 10/05/2012 05:02   Final   Culture     Final   Value:        BLOOD CULTURE RECEIVED NO GROWTH TO DATE CULTURE WILL BE HELD FOR 5 DAYS BEFORE ISSUING A FINAL NEGATIVE REPORT   Report Status PENDING   Incomplete     Studies:  Recent x-ray studies have been reviewed in detail by the Attending Physician  Scheduled Meds:  Scheduled Meds: . aspirin  325 mg Oral Daily  . enoxaparin (LOVENOX) injection  30 mg Subcutaneous Q24H  . hydrocortisone sod succinate (SOLU-CORTEF) inj  100 mg Intravenous Q6H  . insulin aspart  0-9 Units Subcutaneous TID WC  . levothyroxine  112 mcg Oral QAC breakfast  . sodium chloride  3 mL Intravenous Q12H   Time spent on care of this patient: 35 mins   [facility] T  [facility] Office  [phone] Pager - Text Page per Loretha Stapler as per below:  On-Call/Text Page:      [email or web address]      password [email or web address]  If 7PM-7AM, please contact night-coverage [email or web address] Password [email or web address] 10/06/2012, 3:03 PM   LOS: 2 days

## 2012-10-07 LAB — CBC
MCH: 30.1 pg (ref 26.0–34.0)
MCV: 87.6 fL (ref 78.0–100.0)
Platelets: 273 10*3/uL (ref 150–400)
RDW: 13.6 % (ref 11.5–15.5)
WBC: 8.3 10*3/uL (ref 4.0–10.5)

## 2012-10-07 LAB — RENAL FUNCTION PANEL
Albumin: 3.2 g/dL — ABNORMAL LOW (ref 3.5–5.2)
Calcium: 8.1 mg/dL — ABNORMAL LOW (ref 8.4–10.5)
Creatinine, Ser: 4.71 mg/dL — ABNORMAL HIGH (ref 0.50–1.35)
GFR calc Af Amer: 13 mL/min — ABNORMAL LOW (ref >90)
GFR calc non Af Amer: 11 mL/min — ABNORMAL LOW (ref >90)
Sodium: 135 mEq/L (ref 135–145)

## 2012-10-07 LAB — GLUCOSE, CAPILLARY: Glucose-Capillary: 218 mg/dL — ABNORMAL HIGH (ref 70–99)

## 2012-10-07 MED ORDER — HYDROCORTISONE SOD SUCCINATE 100 MG IJ SOLR
25.0000 mg | Freq: Two times a day (BID) | INTRAMUSCULAR | Status: DC
  Administered 2012-10-07: 25 mg via INTRAVENOUS
  Filled 2012-10-07 (×3): qty 0.5

## 2012-10-07 MED ORDER — INSULIN GLARGINE 100 UNIT/ML ~~LOC~~ SOLN
7.0000 [IU] | Freq: Once | SUBCUTANEOUS | Status: DC
  Filled 2012-10-07: qty 0.07

## 2012-10-07 MED ORDER — INSULIN GLARGINE 100 UNIT/ML ~~LOC~~ SOLN
35.0000 [IU] | Freq: Every day | SUBCUTANEOUS | Status: DC
  Administered 2012-10-07: 7 [IU] via SUBCUTANEOUS
  Filled 2012-10-07 (×2): qty 0.35

## 2012-10-07 NOTE — Progress Notes (Signed)
Call placed to Dr. Susie Cassette to clarify Lantus order, new order received.

## 2012-10-07 NOTE — Progress Notes (Signed)
Patient ID: Jack Chung, male   DOB: 10-05-1940, 72 y.o.   MRN: [id number] S:feels better O:BP 114/60  Pulse 72  Temp(Src) 98 F (36.7 C) (Oral)  Resp 20  Ht 5\' 10"  (1.778 m)  Wt 111.727 kg (246 lb 5 oz)  BMI 35.34 kg/m2  SpO2 98%  Intake/Output Summary (Last 24 hours) at 10/07/12 1256 Last data filed at 10/07/12 0453  Gross per 24 hour  Intake    480 ml  Output      0 ml  Net    480 ml   Intake/Output: I/O last 3 completed shifts: In: 600 [P.O.:600] Out: 400 [Urine:400]  Intake/Output this shift:    Weight change: 0.127 kg (4.5 oz) Gen:WD WN obese WM in NAD CVS:no rub Resp:decreased BS at bases WUJ:WJXBJ, nontender Ext:no edema   Recent Labs Lab 10/04/12 1732 10/04/12 2301 10/05/12 0340 10/06/12 0440 10/07/12 0554  NA 135  --  136 132* 135  K 3.7  --  4.3 4.9 4.0  CL 96  --  97 93* 96  CO2 24  --  27 24 24   GLUCOSE 114*  --  165* 400* 259*  BUN 75*  --  76* 85* 95*  CREATININE 4.59* 4.68* 4.60* 5.17* 4.71*  ALBUMIN 3.4*  --  3.3* 3.4* 3.2*  CALCIUM 9.1  --  8.4 8.5 8.1*  PHOS  --   --  5.1* 5.0* 4.4  AST 19  --  17  --   --   ALT 12  --  10  --   --    Liver Function Tests:  Recent Labs Lab 10/04/12 1732 10/05/12 0340 10/06/12 0440 10/07/12 0554  AST 19 17  --   --   ALT 12 10  --   --   ALKPHOS 95 90  --   --   BILITOT 0.1* 0.2*  --   --   PROT 7.0 6.6  --   --   ALBUMIN 3.4* 3.3* 3.4* 3.2*   No results found for this basename: LIPASE, AMYLASE,  in the last 168 hours No results found for this basename: AMMONIA,  in the last 168 hours CBC:  Recent Labs Lab 10/04/12 1732 10/04/12 2301 10/05/12 0340 10/06/12 0440 10/07/12 0554  WBC 7.5 7.5 7.7 7.8 8.3  NEUTROABS 5.3  --   --   --   --   HGB 10.6* 9.9* 9.7* 9.5* 9.2*  HCT 31.4* 30.1* 29.9* 28.3* 26.8*  MCV 89.7 89.9 89.8 88.7 87.6  PLT 289 261 270 264 273   Cardiac Enzymes:  Recent Labs Lab 10/04/12 2301 10/05/12 0340 10/05/12 1020  TROPONINI <0.30 <0.30 <0.30   CBG:  Recent  Labs Lab 10/06/12 1217 10/06/12 1615 10/06/12 2037 10/07/12 0736 10/07/12 1118  GLUCAP 367* 433* 348* 233* 230*    Iron Studies:  Recent Labs  10/05/12 0340  IRON 55  TIBC 262  FERRITIN 194   Studies/Results: US Renal  10/05/2012  *RADIOLOGY REPORT*  Clinical Data: History of elevated creatinine level of 4.5. Evaluate for hydronephrosis.  RENAL/URINARY TRACT ULTRASOUND COMPLETE  Comparison:  None.  Findings:  Right Kidney:  Right renal length is 10.1 cm.  Left Kidney:  Left renal length is 10.9 cm.  There is evidence of chronic medical renal disease with cortical thinning bilaterally with increased echogenicity of the renal parenchyma with decreased differentiation between cortex and medulla.  There is no evidence of hydronephrosis involving either kidney.  No calculus is evident.  There is  a small hypoechoic mass involving the upper pole of the left kidney.  This is hypoechoic with a few internal echoes.  There is slight posterior acoustic enhancement on some of the images. This is felt to represent probable small cyst with some debris within it.  A small mass measures 1.0 x 1.3 x 1.1 cm.  Bladder:  No urinary bladder lesion is identified.  IMPRESSION: Normal renal lengths but there is evidence of chronic medical renal disease with cortical thinning bilaterally and increased echogenicity of renal parenchyma with decreased differentiation between cortex and medulla.  There is no evidence of hydronephrosis involving either kidney.  Small hypoechoic mass involving upper pole of the left kidney is hypoechoic with a few internal echoes most likely reflecting small cyst with some internal debris.  No internal solid nodular area was evident.   Original Report Authenticated By: Onalee Hua Call    . allopurinol  100 mg Oral TID  . aspirin  325 mg Oral Daily  . enoxaparin (LOVENOX) injection  30 mg Subcutaneous Q24H  . hydrocortisone sod succinate (SOLU-CORTEF) inj  25 mg Intravenous Q12H  . insulin  aspart  0-20 Units Subcutaneous TID WC  . insulin aspart  0-5 Units Subcutaneous QHS  . insulin glargine  35 Units Subcutaneous Daily  . insulin glargine  7 Units Subcutaneous Once  . levothyroxine  112 mcg Oral QAC breakfast  . metoprolol  50 mg Oral BID  . sodium chloride  3 mL Intravenous Q12H    BMET    Component Value Date/Time   NA 135 10/07/2012 0554   K 4.0 10/07/2012 0554   CL 96 10/07/2012 0554   CO2 24 10/07/2012 0554   GLUCOSE 259* 10/07/2012 0554   BUN 95* 10/07/2012 0554   CREATININE 4.71* 10/07/2012 0554   CALCIUM 8.1* 10/07/2012 0554   GFRNONAA 11* 10/07/2012 0554   GFRAA 13* 10/07/2012 0554   CBC    Component Value Date/Time   WBC 8.3 10/07/2012 0554   RBC 3.06* 10/07/2012 0554   HGB 9.2* 10/07/2012 0554   HCT 26.8* 10/07/2012 0554   PLT 273 10/07/2012 0554   MCV 87.6 10/07/2012 0554   MCH 30.1 10/07/2012 0554   MCHC 34.3 10/07/2012 0554   RDW 13.6 10/07/2012 0554   LYMPHSABS 1.0 10/04/2012 1732   MONOABS 0.6 10/04/2012 1732   EOSABS 0.6 10/04/2012 1732   BASOSABS 0.1 10/04/2012 1732     Assessment/Plan:  1. 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 glucose intolerance/hypoglycemia, C. probable nephropathy--per primary care. Watch glucose on hydrocortisone. Poorly controlled. 2. High BP--BP not high now 3. CKD--4/5-check renal ultrasound, SPEP, UPEP, save right arm for vascular access, vein mapping, check PTH, first void urine for protein/creatinine ratio.  1. Pt is amenable to vascular access placement. 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  Vein mapping done and plan for [name] AVF on Tuesday or as an outpt if he is discharged before then. 2. He and his wife are interested in home hemo. 3. Will educate on dialysis 4. Anemia-iron studies indicate iron deficiency. Will give IV iron, then begin Aranesp 5. Bradycardia--HR 60-70 today 6. Past hx sarcoidosis--no hilar adenopathy on current CXR 7. Gout--had been on allopurinol and prednisone.  Check uric acid. OK with me to restart allopurinol if uric acid > 6. 8. Hypothyroidism--continue meds (L-thyroxine 0.112 mg/d) 9. Disposition- not yet stable for d/c in light of rising Scr 10.  Trudee Chirino  A 

## 2012-10-07 NOTE — Progress Notes (Signed)
[facility] PROGRESS NOTE  Jillian Pianka ZOX:[id number] DOB: 22-Aug-1940 DOA: 10/04/2012 PCP: [name], MD  Assessment/Plan: Principal Problem:   Encephalopathy, metabolic Active Problems:   Bradycardia   Hypotension   Hypoglycemia   Hypothermia   Anemia   CKD (chronic kidney disease)    Brief narrative:  72 year old male with history of hypertension, diabetes mellitus type 2, congestive heart failure, anemia, coronary artery disease and status post CABG with subsequent complication by MRSA infection requiring prolonged IV ABX, sleep apnea, chronic kidney disease requiring hemodialysis in 2011 for 3 months, per wife's report kidney function had improved and hemodialysis was discontinued (she thinks his baseline creatinine is ~ 3, he has not established care with nephrologist in [place]). Patient moved from [place] in July 2013 and presented to [facility] emergency department with main concern of progressive worsening generalized weakness, associated with one week duration of intermittent nausea, nonbloody vomiting and nonbloody diarrhea, early satiety, poor oral intake, significant peripheral edema. Per wife's report, several hours prior to admission she found him unresponsive at home and checked his blood sugar but it was undetectable on the machine. She gave him Glucotrol under his tongue and when EMS arrived his sugar was 45 and blood pressure was 70/30. In addition she reported very low temperature when EMS checked it but she is not sure of the exact reading. Of note, patient's wife brought medical records with her and review of records shows that last creatinine 07/27/2012 was 4.69, Hg 11.3, TSH within normal limits.  In the ED the pt was found to be lethargic/difficult to arouse, hypotensive with systolic blood pressure in 80s to 90s, hypothermic at 93.2 F, heart rate in 40s.    Assessment/Plan:  Encephalopathy, metabolic  possibly multifactorial, due to hypoglycemia  due to poor oral intake, hypotensive shock, dehydration - resolved at this time   Bradycardia  Was on BB at home - currently on hold - heart rate has recovered - given her history of coronary artery disease will resume BB at low dose and follow   Hypotension w/ hx of HTN  no clear infectious etiology as UA and CXR are relatively unremarkable, lactic acid and procalcitonin stable - likely related to poor by mouth intake as well as hypoglycemia - continue to follow trend  Continue to taper stress dose steroids   Hypoglycemia in DM2  home regimen includes Lanuts 45 units and insulin novolog - further history reveals patient had initially been struggling with severe hyperglycemia and was dosing with aggressive sliding scale insulin followed by 48+ hours of rebound hypoglycemia - CBG is now very poorly controlled with high-dose steroid use - resume Lantus in a.m. and adjust sliding scale   Hypothermia  Likely due to severe hypoglycemia - resolved   Anemia - Fe deficient  give IV iron, then begin Aranesp per nephrology   CKD (chronic kidney disease)  creatinine of 2.2 in January 2013 - February of this year was 4.69 - care as per Nephrology   Hypothyroid  Continue home Synthroid dose - TSH at goal  Gout  Is on chronic low-dose steroid plus allopurinol - no acute flare at this time - uric acid is elevated - rapidly wean steroids to baseline dose and resume allopurinol  Blindness OS  occurred following surgery for diabetic retinopathy    Code Status: FULL  Family Communication: Spoke with patient and wife at bedside  Disposition Plan: Anticipate discharge tomorrow Consultants:  Nephrology  Procedures:  5/1 - TTE - normal study -  EF 60-65%  Antibiotics:  none  DVT prophylaxis:  lovenox  HPI/Subjective:  The patient is alert interactive and quite pleasant. He denies any fevers chills nausea or vomiting at the present time. He does report poor appetite    Objective: Filed Vitals:    10/06/12 1216 10/06/12 1616 10/06/12 2041 10/07/12 0452  BP: 120/56 131/64 134/56 110/57  Pulse: 72 79 83 71  Temp: 98.3 F (36.8 C) 98.7 F (37.1 C) 98.3 F (36.8 C) 98 F (36.7 C)  TempSrc: Oral Oral Oral Oral  Resp: 14 35 22 20  Height:   5\' 10"  (1.778 m)   Weight:   111.727 kg (246 lb 5 oz)   SpO2: 96% 99% 100% 99%    Intake/Output Summary (Last 24 hours) at 10/07/12 0939 Last data filed at 10/07/12 0453  Gross per 24 hour  Intake    480 ml  Output      0 ml  Net    480 ml    Exam:  General: No acute respiratory distress  Lungs: Clear to auscultation bilaterally without wheezes or crackles  Cardiovascular: Regular rate and rhythm without murmur gallop or rub normal S1 and S2  Abdomen: Nontender, nondistended, soft, bowel sounds positive, no rebound, no ascites, no appreciable mass  Extremities: No significant cyanosis, clubbing, or edema bilateral lower extremities       Data Reviewed: Basic Metabolic Panel:  Recent Labs Lab 10/04/12 1732 10/04/12 2301 10/05/12 0340 10/06/12 0440 10/07/12 0554  NA 135  --  136 132* 135  K 3.7  --  4.3 4.9 4.0  CL 96  --  97 93* 96  CO2 24  --  27 24 24   GLUCOSE 114*  --  165* 400* 259*  BUN 75*  --  76* 85* 95*  CREATININE 4.59* 4.68* 4.60* 5.17* 4.71*  CALCIUM 9.1  --  8.4 8.5 8.1*  MG  --   --  2.2  --   --   PHOS  --   --  5.1* 5.0* 4.4    Liver Function Tests:  Recent Labs Lab 10/04/12 1732 10/05/12 0340 10/06/12 0440 10/07/12 0554  AST 19 17  --   --   ALT 12 10  --   --   ALKPHOS 95 90  --   --   BILITOT 0.1* 0.2*  --   --   PROT 7.0 6.6  --   --   ALBUMIN 3.4* 3.3* 3.4* 3.2*   No results found for this basename: LIPASE, AMYLASE,  in the last 168 hours No results found for this basename: AMMONIA,  in the last 168 hours  CBC:  Recent Labs Lab 10/04/12 1732 10/04/12 2301 10/05/12 0340 10/06/12 0440 10/07/12 0554  WBC 7.5 7.5 7.7 7.8 8.3  NEUTROABS 5.3  --   --   --   --   HGB 10.6* 9.9*  9.7* 9.5* 9.2*  HCT 31.4* 30.1* 29.9* 28.3* 26.8*  MCV 89.7 89.9 89.8 88.7 87.6  PLT 289 261 270 264 273    Cardiac Enzymes:  Recent Labs Lab 10/04/12 2301 10/05/12 0340 10/05/12 1020  TROPONINI <0.30 <0.30 <0.30   BNP (last 3 results) No results found for this basename: PROBNP,  in the last 8760 hours   CBG:  Recent Labs Lab 10/06/12 0749 10/06/12 1217 10/06/12 1615 10/06/12 2037 10/07/12 0736  GLUCAP 366* 367* 433* 348* 233*    Recent Results (from the past 240 hour(s))  MRSA PCR SCREENING  Status: None   Collection Time    10/04/12  9:47 PM      Result Value Range Status   MRSA by PCR NEGATIVE  NEGATIVE Final   Comment:            The GeneXpert MRSA Assay (FDA     approved for NASAL specimens     only), is one component of a     comprehensive MRSA colonization     surveillance program. It is not     intended to diagnose MRSA     infection nor to guide or     monitor treatment for     MRSA infections.  CULTURE, BLOOD (ROUTINE X 2)     Status: None   Collection Time    10/04/12 11:00 PM      Result Value Range Status   Specimen Description BLOOD LEFT ARM   Final   Special Requests BOTTLES DRAWN AEROBIC AND ANAEROBIC 5CC   Final   Culture  Setup Time 10/05/2012 05:02   Final   Culture     Final   Value:        BLOOD CULTURE RECEIVED NO GROWTH TO DATE CULTURE WILL BE HELD FOR 5 DAYS BEFORE ISSUING A FINAL NEGATIVE REPORT   Report Status PENDING   Incomplete  CULTURE, BLOOD (ROUTINE X 2)     Status: None   Collection Time    10/04/12 11:00 PM      Result Value Range Status   Specimen Description BLOOD RIGHT ARM   Final   Special Requests BOTTLES DRAWN AEROBIC AND ANAEROBIC 3CC   Final   Culture  Setup Time 10/05/2012 05:02   Final   Culture     Final   Value:        BLOOD CULTURE RECEIVED NO GROWTH TO DATE CULTURE WILL BE HELD FOR 5 DAYS BEFORE ISSUING A FINAL NEGATIVE REPORT   Report Status PENDING   Incomplete     Studies: US Renal  10/05/2012   *RADIOLOGY REPORT*  Clinical Data: History of elevated creatinine level of 4.5. Evaluate for hydronephrosis.  RENAL/URINARY TRACT ULTRASOUND COMPLETE  Comparison:  None.  Findings:  Right Kidney:  Right renal length is 10.1 cm.  Left Kidney:  Left renal length is 10.9 cm.  There is evidence of chronic medical renal disease with cortical thinning bilaterally with increased echogenicity of the renal parenchyma with decreased differentiation between cortex and medulla.  There is no evidence of hydronephrosis involving either kidney.  No calculus is evident.  There is a small hypoechoic mass involving the upper pole of the left kidney.  This is hypoechoic with a few internal echoes.  There is slight posterior acoustic enhancement on some of the images. This is felt to represent probable small cyst with some debris within it.  A small mass measures 1.0 x 1.3 x 1.1 cm.  Bladder:  No urinary bladder lesion is identified.  IMPRESSION: Normal renal lengths but there is evidence of chronic medical renal disease with cortical thinning bilaterally and increased echogenicity of renal parenchyma with decreased differentiation between cortex and medulla.  There is no evidence of hydronephrosis involving either kidney.  Small hypoechoic mass involving upper pole of the left kidney is hypoechoic with a few internal echoes most likely reflecting small cyst with some internal debris.  No internal solid nodular area was evident.   Original Report Authenticated By: Onalee Hua Call    Dg Chest Portable 1 View  10/04/2012  *RADIOLOGY  REPORT*  Clinical Data: Hypoglycemia.  Weakness.  PORTABLE CHEST - 1 VIEW  Comparison: None.  Findings: The cardiopericardial silhouette is enlarged.  Single median sternotomy wire is present. Monitoring leads are projected over the chest.  No airspace disease.  No effusion.  Suboptimal inspiration.  Basilar atelectasis.  IMPRESSION: Cardiomegaly without failure.   Original Report Authenticated By: Andreas Newport, M.D.     Scheduled Meds: . allopurinol  100 mg Oral TID  . aspirin  325 mg Oral Daily  . enoxaparin (LOVENOX) injection  30 mg Subcutaneous Q24H  . hydrocortisone sod succinate (SOLU-CORTEF) inj  50 mg Intravenous Q12H  . insulin aspart  0-20 Units Subcutaneous TID WC  . insulin aspart  0-5 Units Subcutaneous QHS  . insulin glargine  28 Units Subcutaneous Daily  . levothyroxine  112 mcg Oral QAC breakfast  . metoprolol  50 mg Oral BID  . sodium chloride  3 mL Intravenous Q12H   Continuous Infusions:   Principal Problem:   Encephalopathy, metabolic Active Problems:   Bradycardia   Hypotension   Hypoglycemia   Hypothermia   Anemia   CKD (chronic kidney disease)    Time spent: 40 minutes   [facility]  [facility] Pager [phone]. If 8PM-8AM, please contact night-coverage at [email or web address], password [facility] 10/07/2012, 9:39 AM  LOS: 3 days

## 2012-10-08 DIAGNOSIS — N186 End stage renal disease: Secondary | ICD-10-CM

## 2012-10-08 LAB — GLUCOSE, CAPILLARY
Glucose-Capillary: 269 mg/dL — ABNORMAL HIGH (ref 70–99)
Glucose-Capillary: 171 mg/dL — ABNORMAL HIGH (ref 70–99)
Glucose-Capillary: 211 mg/dL — ABNORMAL HIGH (ref 70–99)

## 2012-10-08 LAB — RENAL FUNCTION PANEL
CO2: 25 mEq/L (ref 19–32)
Chloride: 97 mEq/L (ref 96–112)
GFR calc Af Amer: 14 mL/min — ABNORMAL LOW (ref >90)
Glucose, Bld: 208 mg/dL — ABNORMAL HIGH (ref 70–99)
Potassium: 3.6 mEq/L (ref 3.5–5.1)
Sodium: 134 mEq/L — ABNORMAL LOW (ref 135–145)

## 2012-10-08 MED ORDER — METOPROLOL TARTRATE 100 MG PO TABS: 100.0000 mg | ORAL_TABLET | Freq: Two times a day (BID) | ORAL | Status: DC

## 2012-10-08 MED ORDER — METOPROLOL TARTRATE 100 MG PO TABS: 50.0000 mg | ORAL_TABLET | Freq: Two times a day (BID) | ORAL | Status: DC

## 2012-10-08 MED ORDER — INSULIN GLARGINE 100 UNIT/ML ~~LOC~~ SOLN
35.0000 [IU] | Freq: Every day | SUBCUTANEOUS | Status: DC
  Administered 2012-10-08: 35 [IU] via SUBCUTANEOUS
  Filled 2012-10-08 (×2): qty 0.35

## 2012-10-08 NOTE — Progress Notes (Signed)
Patient ID: Jack Chung, male   DOB: 1941/05/25, 72 y.o.   MRN: [id number] S:pt feels much better O:BP 130/61  Pulse 63  Temp(Src) 97.8 F (36.6 C) (Oral)  Resp 18  Ht 5\' 10"  (1.778 m)  Wt 111.6 kg (246 lb 0.5 oz)  BMI 35.3 kg/m2  SpO2 97%  Intake/Output Summary (Last 24 hours) at 10/08/12 0831 Last data filed at 10/07/12 1347  Gross per 24 hour  Intake    250 ml  Output      0 ml  Net    250 ml   Intake/Output: I/O last 3 completed shifts: In: 490 [P.O.:490] Out: -   Intake/Output this shift:    Weight change: -0.127 kg (-4.5 oz) Gen:WD obese WM in NAD CVS:no rub Resp:cta FAO:[id number] Ext:+tr edema   Recent Labs Lab 10/04/12 1732 10/04/12 2301 10/05/12 0340 10/06/12 0440 10/07/12 0554 10/08/12 0530  NA 135  --  136 132* 135 134*  K 3.7  --  4.3 4.9 4.0 3.6  CL 96  --  97 93* 96 97  CO2 24  --  27 24 24 25   GLUCOSE 114*  --  165* 400* 259* 208*  BUN 75*  --  76* 85* 95* 93*  CREATININE 4.59* 4.68* 4.60* 5.17* 4.71* 4.40*  ALBUMIN 3.4*  --  3.3* 3.4* 3.2* 3.1*  CALCIUM 9.1  --  8.4 8.5 8.1* 8.1*  PHOS  --   --  5.1* 5.0* 4.4 4.7*  AST 19  --  17  --   --   --   ALT 12  --  10  --   --   --    Liver Function Tests:  Recent Labs Lab 10/04/12 1732 10/05/12 0340 10/06/12 0440 10/07/12 0554 10/08/12 0530  AST 19 17  --   --   --   ALT 12 10  --   --   --   ALKPHOS 95 90  --   --   --   BILITOT 0.1* 0.2*  --   --   --   PROT 7.0 6.6  --   --   --   ALBUMIN 3.4* 3.3* 3.4* 3.2* 3.1*   No results found for this basename: LIPASE, AMYLASE,  in the last 168 hours No results found for this basename: AMMONIA,  in the last 168 hours CBC:  Recent Labs Lab 10/04/12 1732 10/04/12 2301 10/05/12 0340 10/06/12 0440 10/07/12 0554  WBC 7.5 7.5 7.7 7.8 8.3  NEUTROABS 5.3  --   --   --   --   HGB 10.6* 9.9* 9.7* 9.5* 9.2*  HCT 31.4* 30.1* 29.9* 28.3* 26.8*  MCV 89.7 89.9 89.8 88.7 87.6  PLT 289 261 270 264 273   Cardiac Enzymes:  Recent Labs Lab  10/04/12 2301 10/05/12 0340 10/05/12 1020  TROPONINI <0.30 <0.30 <0.30   CBG:  Recent Labs Lab 10/07/12 0736 10/07/12 1118 10/07/12 1629 10/07/12 2158 10/08/12 0801  GLUCAP 233* 230* 218* 269* 171*    Iron Studies: No results found for this basename: IRON, TIBC, TRANSFERRIN, FERRITIN,  in the last 72 hours Studies/Results: No results found. Marland Kitchen allopurinol  100 mg Oral TID  . aspirin  325 mg Oral Daily  . enoxaparin (LOVENOX) injection  30 mg Subcutaneous Q24H  . insulin aspart  0-20 Units Subcutaneous TID WC  . insulin aspart  0-5 Units Subcutaneous QHS  . insulin glargine  35 Units Subcutaneous Daily  . insulin glargine  7 Units Subcutaneous  Once  . levothyroxine  112 mcg Oral QAC breakfast  . metoprolol  50 mg Oral BID  . sodium chloride  3 mL Intravenous Q12H    BMET    Component Value Date/Time   NA 134* 10/08/2012 0530   K 3.6 10/08/2012 0530   CL 97 10/08/2012 0530   CO2 25 10/08/2012 0530   GLUCOSE 208* 10/08/2012 0530   BUN 93* 10/08/2012 0530   CREATININE 4.40* 10/08/2012 0530   CALCIUM 8.1* 10/08/2012 0530   GFRNONAA 12* 10/08/2012 0530   GFRAA 14* 10/08/2012 0530   CBC    Component Value Date/Time   WBC 8.3 10/07/2012 0554   RBC 3.06* 10/07/2012 0554   HGB 9.2* 10/07/2012 0554   HCT 26.8* 10/07/2012 0554   PLT 273 10/07/2012 0554   MCV 87.6 10/07/2012 0554   MCH 30.1 10/07/2012 0554   MCHC 34.3 10/07/2012 0554   RDW 13.6 10/07/2012 0554   LYMPHSABS 1.0 10/04/2012 1732   MONOABS 0.6 10/04/2012 1732   EOSABS 0.6 10/04/2012 1732   BASOSABS 0.1 10/04/2012 1732     Assessment/Plan:  1. 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 glucose intolerance/hypoglycemia, C. probable nephropathy--per primary care. Watch glucose on hydrocortisone. Poorly controlled. 2. High BP--BP not high now 3. CKD--4/5-check renal ultrasound, SPEP, UPEP, save right arm for vascular access, vein mapping, check PTH, first void urine for protein/creatinine ratio.  1. Pt is amenable to vascular access  placement. 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 Vein mapping done and plan for [name] AVF on Tuesday or as an outpt if he is discharged before then. 2. He and his wife are interested in home hemo. 3. Will educate on dialysis 4. Anemia-iron studies indicate iron deficiency. Will give IV iron, then begin Aranesp 5. Bradycardia--HR 60-70 today 6. Past hx sarcoidosis--no hilar adenopathy on current CXR 7. Gout--had been on allopurinol and prednisone. Check uric acid. OK with me to restart allopurinol if uric acid > 6. 8. Hypothyroidism--continue meds (L-thyroxine 0.112 mg/d) 9. Disposition-  stable for d/c in light of improving Scr.  He will be seen by Dr. Myra Gianotti prior to discharge and schedule outpt AVF placement.  Will also arrange outpt f/u with me this month.  The pt and his wife are interested in home hemo as an option for RRT.  Also recommend referral to outpatient neurorehab for [facility] program due to gait and balance dysfunction (presumably due to peripheral neuropathy). 10.  Kimmy Totten A

## 2012-10-08 NOTE — Discharge Summary (Addendum)
Physician Discharge Summary  Jack Chung MRN: [id number] DOB/AGE: 01/10/1941 72 y.o.  PCP: [name], MD   Admit date: 10/04/2012 Discharge date: 10/08/2012  Discharge Diagnoses:       Encephalopathy, metabolic Active Problems:   Bradycardia   Hypotension   Hypoglycemia   Hypothermia   Anemia   CKD (chronic kidney disease)      Medication List    STOP taking these medications       lisinopril 40 MG tablet  Commonly known as:  PRINIVIL,ZESTRIL      TAKE these medications       acidophilus Caps  Take 1 capsule by mouth 2 (two) times daily.     allopurinol 100 MG tablet  Commonly known as:  ZYLOPRIM  Take 100 mg by mouth 3 (three) times daily.     amLODipine 10 MG tablet  Commonly known as:  NORVASC  Take 10 mg by mouth daily.     aspirin 325 MG tablet  Take 325 mg by mouth daily.     CALCIUM + D PO  Take 1 tablet by mouth 2 (two) times daily.     Cranberry 300 MG tablet  Take 300 mg by mouth 2 (two) times daily.     ferrous fumarate 325 (106 FE) MG Tabs  Commonly known as:  HEMOCYTE - 106 mg FE  Take 1 tablet by mouth.     furosemide 40 MG tablet  Commonly known as:  LASIX  Take 40 mg by mouth 2 (two) times daily.     insulin aspart 100 UNIT/ML injection  Commonly known as:  novoLOG  Inject 2-10 Units into the skin 3 (three) times daily before meals.     insulin glargine 100 UNIT/ML injection  Commonly known as:  LANTUS  Inject 35 Units into the skin every morning.     levothyroxine 112 MCG tablet  Commonly known as:  SYNTHROID, LEVOTHROID  Take 112 mcg by mouth daily.     magnesium oxide 400 MG tablet  Commonly known as:  MAG-OX  Take 400 mg by mouth daily.     metoprolol 100 MG tablet  Commonly known as:  LOPRESSOR  Take 1 tablet (100 mg total) by mouth 2 (two) times daily.     predniSONE 1 MG tablet  Commonly known as:  DELTASONE  Take 1 mg by mouth 2 (two) times daily.      Discharge Condition:  *stable   Disposition: 01-Home or Self Care   Consults:  Irena Cords, MD   Significant Diagnostic Studies: US Renal  10/05/2012  *RADIOLOGY REPORT*  Clinical Data: History of elevated creatinine level of 4.5. Evaluate for hydronephrosis.  RENAL/URINARY TRACT ULTRASOUND COMPLETE  Comparison:  None.  Findings:  Right Kidney:  Right renal length is 10.1 cm.  Left Kidney:  Left renal length is 10.9 cm.  There is evidence of chronic medical renal disease with cortical thinning bilaterally with increased echogenicity of the renal parenchyma with decreased differentiation between cortex and medulla.  There is no evidence of hydronephrosis involving either kidney.  No calculus is evident.  There is a small hypoechoic mass involving the upper pole of the left kidney.  This is hypoechoic with a few internal echoes.  There is slight posterior acoustic enhancement on some of the images. This is felt to represent probable small cyst with some debris within it.  A small mass measures 1.0 x 1.3 x 1.1 cm.  Bladder:  No urinary bladder lesion  is identified.  IMPRESSION: Normal renal lengths but there is evidence of chronic medical renal disease with cortical thinning bilaterally and increased echogenicity of renal parenchyma with decreased differentiation between cortex and medulla.  There is no evidence of hydronephrosis involving either kidney.  Small hypoechoic mass involving upper pole of the left kidney is hypoechoic with a few internal echoes most likely reflecting small cyst with some internal debris.  No internal solid nodular area was evident.   Original Report Authenticated By: Onalee Hua Call    Dg Chest Portable 1 View  10/04/2012  *RADIOLOGY REPORT*  Clinical Data: Hypoglycemia.  Weakness.  PORTABLE CHEST - 1 VIEW  Comparison: None.  Findings: The cardiopericardial silhouette is enlarged.  Single median sternotomy wire is present. Monitoring leads are projected over the chest.  No airspace disease.  No  effusion.  Suboptimal inspiration.  Basilar atelectasis.  IMPRESSION: Cardiomegaly without failure.   Original Report Authenticated By: Andreas Newport, M.D.      Microbiology: Recent Results (from the past 240 hour(s))  MRSA PCR SCREENING     Status: None   Collection Time    10/04/12  9:47 PM      Result Value Range Status   MRSA by PCR NEGATIVE  NEGATIVE Final   Comment:            The GeneXpert MRSA Assay (FDA     approved for NASAL specimens     only), is one component of a     comprehensive MRSA colonization     surveillance program. It is not     intended to diagnose MRSA     infection nor to guide or     monitor treatment for     MRSA infections.  CULTURE, BLOOD (ROUTINE X 2)     Status: None   Collection Time    10/04/12 11:00 PM      Result Value Range Status   Specimen Description BLOOD LEFT ARM   Final   Special Requests BOTTLES DRAWN AEROBIC AND ANAEROBIC 5CC   Final   Culture  Setup Time 10/05/2012 05:02   Final   Culture     Final   Value:        BLOOD CULTURE RECEIVED NO GROWTH TO DATE CULTURE WILL BE HELD FOR 5 DAYS BEFORE ISSUING A FINAL NEGATIVE REPORT   Report Status PENDING   Incomplete  CULTURE, BLOOD (ROUTINE X 2)     Status: None   Collection Time    10/04/12 11:00 PM      Result Value Range Status   Specimen Description BLOOD RIGHT ARM   Final   Special Requests BOTTLES DRAWN AEROBIC AND ANAEROBIC 3CC   Final   Culture  Setup Time 10/05/2012 05:02   Final   Culture     Final   Value:        BLOOD CULTURE RECEIVED NO GROWTH TO DATE CULTURE WILL BE HELD FOR 5 DAYS BEFORE ISSUING A FINAL NEGATIVE REPORT   Report Status PENDING   Incomplete     Labs: Results for orders placed during the hospital encounter of 10/04/12 (from the past 48 hour(s))  GLUCOSE, CAPILLARY     Status: Abnormal   Collection Time    10/06/12 12:17 PM      Result Value Range   Glucose-Capillary 367 (*) 70 - 99 mg/dL   Comment 1 Notify RN    GLUCOSE, CAPILLARY     Status:  Abnormal   Collection Time  10/06/12  4:15 PM      Result Value Range   Glucose-Capillary 433 (*) 70 - 99 mg/dL   Comment 1 Notify RN    GLUCOSE, CAPILLARY     Status: Abnormal   Collection Time    10/06/12  8:37 PM      Result Value Range   Glucose-Capillary 348 (*) 70 - 99 mg/dL  CBC     Status: Abnormal   Collection Time    10/07/12  5:54 AM      Result Value Range   WBC 8.3  4.0 - 10.5 K/uL   RBC 3.06 (*) 4.22 - 5.81 MIL/uL   Hemoglobin 9.2 (*) 13.0 - 17.0 g/dL   HCT 45.4 (*) 09.8 - 11.9 %   MCV 87.6  78.0 - 100.0 fL   MCH 30.1  26.0 - 34.0 pg   MCHC 34.3  30.0 - 36.0 g/dL   RDW 14.7  82.9 - 56.2 %   Platelets 273  150 - 400 K/uL  RENAL FUNCTION PANEL     Status: Abnormal   Collection Time    10/07/12  5:54 AM      Result Value Range   Sodium 135  135 - 145 mEq/L   Potassium 4.0  3.5 - 5.1 mEq/L   Comment: DELTA CHECK NOTED   Chloride 96  96 - 112 mEq/L   CO2 24  19 - 32 mEq/L   Glucose, Bld 259 (*) 70 - 99 mg/dL   BUN 95 (*) 6 - 23 mg/dL   Creatinine, Ser 1.30 (*) 0.50 - 1.35 mg/dL   Calcium 8.1 (*) 8.4 - 10.5 mg/dL   Phosphorus 4.4  2.3 - 4.6 mg/dL   Albumin 3.2 (*) 3.5 - 5.2 g/dL   GFR calc non Af Amer 11 (*) >90 mL/min   GFR calc Af Amer 13 (*) >90 mL/min   Comment:            The eGFR has been calculated     using the CKD EPI equation.     This calculation has not been     validated in all clinical     situations.     eGFR's persistently     <90 mL/min signify     possible Chronic Kidney Disease.  GLUCOSE, CAPILLARY     Status: Abnormal   Collection Time    10/07/12  7:36 AM      Result Value Range   Glucose-Capillary 233 (*) 70 - 99 mg/dL  GLUCOSE, CAPILLARY     Status: Abnormal   Collection Time    10/07/12 11:18 AM      Result Value Range   Glucose-Capillary 230 (*) 70 - 99 mg/dL  GLUCOSE, CAPILLARY     Status: Abnormal   Collection Time    10/07/12  4:29 PM      Result Value Range   Glucose-Capillary 218 (*) 70 - 99 mg/dL  GLUCOSE,  CAPILLARY     Status: Abnormal   Collection Time    10/07/12  9:58 PM      Result Value Range   Glucose-Capillary 269 (*) 70 - 99 mg/dL  RENAL FUNCTION PANEL     Status: Abnormal   Collection Time    10/08/12  5:30 AM      Result Value Range   Sodium 134 (*) 135 - 145 mEq/L   Potassium 3.6  3.5 - 5.1 mEq/L   Chloride 97  96 - 112 mEq/L   CO2  25  19 - 32 mEq/L   Glucose, Bld 208 (*) 70 - 99 mg/dL   BUN 93 (*) 6 - 23 mg/dL   Creatinine, Ser 1.61 (*) 0.50 - 1.35 mg/dL   Calcium 8.1 (*) 8.4 - 10.5 mg/dL   Phosphorus 4.7 (*) 2.3 - 4.6 mg/dL   Albumin 3.1 (*) 3.5 - 5.2 g/dL   GFR calc non Af Amer 12 (*) >90 mL/min   GFR calc Af Amer 14 (*) >90 mL/min   Comment:            The eGFR has been calculated     using the CKD EPI equation.     This calculation has not been     validated in all clinical     situations.     eGFR's persistently     <90 mL/min signify     possible Chronic Kidney Disease.  GLUCOSE, CAPILLARY     Status: Abnormal   Collection Time    10/08/12  8:01 AM      Result Value Range   Glucose-Capillary 171 (*) 70 - 99 mg/dL     HPI : Brief narrative:  72 year old male with history of hypertension, diabetes mellitus type 2, congestive heart failure, anemia, coronary artery disease and status post CABG with subsequent complication by MRSA infection requiring prolonged IV ABX, sleep apnea, chronic kidney disease requiring hemodialysis in 2011 for 3 months, per wife's report kidney function had improved and hemodialysis was discontinued (she thinks his baseline creatinine is ~ 3, he has not established care with nephrologist in [place]). Patient moved from [place] in July 2013 and presented to [facility] emergency department with main concern of progressive worsening generalized weakness, associated with one week duration of intermittent nausea, nonbloody vomiting and nonbloody diarrhea, early satiety, poor oral intake, significant peripheral edema. Per wife's report,  several hours prior to admission she found him unresponsive at home and checked his blood sugar but it was undetectable on the machine. She gave him Glucotrol under his tongue and when EMS arrived his sugar was 45 and blood pressure was 70/30. In addition she reported very low temperature when EMS checked it but she is not sure of the exact reading. Of note, patient's wife brought medical records with her and review of records shows that last creatinine 07/27/2012 was 4.69, Hg 11.3, TSH within normal limits.  In the ED the pt was found to be lethargic/difficult to arouse, hypotensive with systolic blood pressure in 80s to 90s, hypothermic at 93.2 F, heart rate in 40s.     HOSPITAL COURSE:  #1 CK D. stage V Dr. Candis Musa to set up,, vascular access placement. 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 Vein mapping done and plan for [name] AVF on Tuesday to be set up as an outpt Call or notify Dr. Myra Gianotti    #2 acute metabolic encephalopathy the setting of hypoglycemia, hypotension now resolved  #3 bradycardia the dose of the patient's beta blocker has been decreased to half the dose  #5 hypoglycemia patient had initially been struggling with severe hyperglycemia and was dosing with aggressive sliding scale insulin followed by 48+ hours of rebound hypoglycemia  Placed back on Lantus at 35 units in the morning, he was started on stress dose steroids during this hospitalization which have been discontinued  #6Hypothermia  Likely due to severe hypoglycemia - resolved    #7 Anemia - Fe deficient  give IV  iron, then begin Aranesp per nephrology    Hypothyroid  Continue home Synthroid dose - TSH at goal  Gout  Is on chronic low-dose steroid plus allopurinol - no acute flare at this time - uric acid is elevated - rapidly wean steroids to baseline dose and resume allopurinol  Blindness OS  occurred following surgery for diabetic retinopathy       Discharge Exam: *  Blood pressure 130/61, pulse 63, temperature 97.8 F (36.6 C), temperature source Oral, resp. rate 18, height 5\' 10"  (1.778 m), weight 111.6 kg (246 lb 0.5 oz), SpO2 97.00%.  General: No acute respiratory distress  Lungs: Clear to auscultation bilaterally without wheezes or crackles  Cardiovascular: Regular rate and rhythm without murmur gallop or rub normal S1 and S2  Abdomen: Nontender, nondistended, soft, bowel sounds positive, no rebound, no ascites, no appreciable mass  Extremities: No significant cyanosis, clubbing, or edema bilateral lower extremities         Signed: Rahshawn Remo 10/08/2012, 8:14 AM

## 2012-10-08 NOTE — Progress Notes (Signed)
Physical Therapy Evaluation Patient Details Name: Jack Chung MRN: [id number] DOB: January 28, 1941 Today's Date: 10/08/2012 Time: 0822-0851 PT Time Calculation (min): 29 min  PT Assessment / Plan / Recommendation Clinical Impression  72 yo male present to PT with increased fall risk given unsteady gait and per wife he has fallen 3-4 time in past year; Will benefit from Outpatient PT to work on gait and balance dysfunction; Per MD, pt will be dc'ing today; Obtained prescription for Outpt PT (thanks!), and educated pt and wife on steps to take to set up appt with Outpt PT; No further acute PT needs, and pt will likely dc today; Will sign off    PT Assessment  All further PT needs can be met in the next venue of care    Follow Up Recommendations  Outpatient PT    Does the patient have the potential to tolerate intense rehabilitation      Barriers to Discharge        Equipment Recommendations  None recommended by PT;Other (comment) (possible need for RW can be addressed at Outpt PT)    Recommendations for Other Services     Frequency      Precautions / Restrictions Precautions Precautions: Fall   Pertinent Vitals/Pain no apparent distress Although pt endorses hip pain at times      Mobility  Bed Mobility Bed Mobility: Supine to Sit Supine to Sit: 6: Modified independent (Device/Increase time) Details for Bed Mobility Assistance: HOB elevated; overall moving well; smooth transition Transfers Transfers: Sit to Stand;Stand to Sit Sit to Stand: 5: Supervision;From bed;From chair/3-in-1;With upper extremity assist Stand to Sit: 5: Supervision;To chair/3-in-1 Details for Transfer Assistance: Overall smooth transition; Uses UEs to push off, thoguh not all the time per pt Ambulation/Gait Ambulation/Gait Assistance: 5: Supervision;4: Min guard Ambulation Distance (Feet): 300 Feet Assistive device: Straight cane Ambulation/Gait Assistance Details: Pt with unsteady gait, with at time  erratic step width and 3-4 losses of balanceduring amb; Apropriate use of cane, and while p would benefit from bilateral UE support given from RW, he is resistant to the idea; he was able to recover from losses of balance without physical assist Gait Pattern: Decreased stride length    Exercises     PT Diagnosis: Difficulty walking;Generalized weakness;Abnormality of gait  PT Problem List: Decreased strength;Decreased activity tolerance;Decreased balance;Decreased mobility;Decreased knowledge of use of DME;Pain PT Treatment Interventions:     PT Goals    Visit Information  Last PT Received On: 10/08/12 Assistance Needed: +1    Subjective Data  Subjective: "not much holds me back"; "I don't do stairs" Patient Stated Goal: home   Prior Functioning  Home Living Lives With: Spouse Available Help at Discharge: Family;Available PRN/intermittently Type of Home: Apartment Home Access: Level entry Home Layout: One level Bathroom Shower/Tub: Health visitor: Handicapped height Bathroom Accessibility: Yes How Accessible: Accessible via wheelchair;Accessible via walker Home Adaptive Equipment: Straight cane;Shower chair with back;Grab bars around toilet;Grab bars in shower;Hand-held shower hose Prior Function Level of Independence: Independent with assistive device(s) Able to Take Stairs?: No Driving:  (Wife drives primarily) 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; they enjoy traveling extensively Communication Communication: No difficulties    Cognition  Cognition Arousal/Alertness: Awake/alert Behavior During Therapy: WFL for tasks assessed/performed Overall Cognitive Status: Within Functional Limits for tasks assessed    Extremity/Trunk Assessment Right Upper Extremity Assessment [name] ROM/Strength/Tone: [facility] for tasks assessed Left Upper Extremity Assessment LUE ROM/Strength/Tone: WFL for tasks assessed Right Lower Extremity Assessment RLE  ROM/Strength/Tone: [facility] for tasks  assessed (for simple mobility) RLE Sensation: History of peripheral neuropathy Left Lower Extremity Assessment LLE ROM/Strength/Tone: WFL for tasks assessed (for simple mobility) LLE Sensation: History of peripheral neuropathy Trunk Assessment Trunk Assessment: Normal   Balance Balance Balance Assessed: Yes  End of Session PT - End of Session Activity Tolerance: Patient tolerated treatment well Patient left: in chair;with family/visitor present;with call bell/phone within reach  GP     Van Clines [facility] [place], [place] [phone]  10/08/2012, 10:06 AM

## 2012-10-08 NOTE — Progress Notes (Signed)
The patient was seen and evaluated today. I discussed proceeding with a right arm fistula on an outpatient basis. My office will contact him with the time and date.  Durene Cal

## 2012-10-08 NOTE — Progress Notes (Signed)
Patient discharge Home per Md order.  Discharge instructions reviewed with patient and family.  Copies of all forms given and explained. Patient/family voiced understanding of all instructions.  Discharge in no acute distress. Shaarav Ripple B. Halston Fairclough, RN BC, BSN, MSN 

## 2012-10-08 NOTE — Consult Note (Signed)
I have seen and evaluated the above patient. I have also reviewed his vein mapping. I feel that he is a good candidate for a right arm fistula. 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 Most likely, this will be a right brachiocephalic fistula, however I would evaluate his cephalic vein in the distal forearm to see whether or not he would be a candidate for a right radiocephalic fistula. We discussed the risks and benefits of the procedure which include but are not limited to steal syndrome, and the need for future interventions to get the fistula to mature. The patient is scheduled to be discharged. His surgery will be arranged on an outpatient basis.  Durene Cal

## 2012-10-09 ENCOUNTER — Other Ambulatory Visit: Payer: Self-pay | Admitting: *Deleted

## 2012-10-09 LAB — PROTEIN ELECTROPHORESIS, SERUM
Alpha-2-Globulin: 11.3 % (ref 7.1–11.8)
M-Spike, %: NOT DETECTED g/dL
Total Protein ELP: 6.5 g/dL (ref 6.0–8.3)

## 2012-10-10 ENCOUNTER — Ambulatory Visit (INDEPENDENT_AMBULATORY_CARE_PROVIDER_SITE_OTHER): Payer: Medicare Other | Admitting: Nurse Practitioner

## 2012-10-10 ENCOUNTER — Encounter: Payer: Self-pay | Admitting: *Deleted

## 2012-10-10 ENCOUNTER — Encounter (HOSPITAL_COMMUNITY): Payer: Self-pay | Admitting: *Deleted

## 2012-10-10 ENCOUNTER — Encounter (HOSPITAL_COMMUNITY): Payer: Self-pay

## 2012-10-10 ENCOUNTER — Encounter: Payer: Self-pay | Admitting: Nurse Practitioner

## 2012-10-10 VITALS — BP 118/62 | HR 60 | Temp 98.0°F | Resp 14 | Ht 70.0 in | Wt 249.4 lb

## 2012-10-10 DIAGNOSIS — E1129 Type 2 diabetes mellitus with other diabetic kidney complication: Secondary | ICD-10-CM

## 2012-10-10 DIAGNOSIS — E1165 Type 2 diabetes mellitus with hyperglycemia: Secondary | ICD-10-CM

## 2012-10-10 DIAGNOSIS — G9341 Metabolic encephalopathy: Secondary | ICD-10-CM

## 2012-10-10 DIAGNOSIS — D649 Anemia, unspecified: Secondary | ICD-10-CM

## 2012-10-10 DIAGNOSIS — N189 Chronic kidney disease, unspecified: Secondary | ICD-10-CM

## 2012-10-10 MED ORDER — DEXTROSE 5 % IV SOLN
1.5000 g | INTRAVENOUS | Status: AC
  Administered 2012-10-11: 1.5 g via INTRAVENOUS
  Filled 2012-10-10 (×2): qty 1.5

## 2012-10-10 NOTE — Progress Notes (Signed)
Patient ID: Jack Chung, male   DOB: 25-May-1941, 72 y.o.   MRN: [id number]   Allergies  Allergen Reactions  . Flu Virus Vaccine     Gets the flu  . Lipitor (Atorvastatin)     Leg cramps    Chief Complaint  Patient presents with  . Hospitalization Follow-up    HPI: Patient is a 72 y.o. male seen in the office today for hospital follow   72 year old male with history of hypertension, diabetes mellitus type 2, congestive heart failure, anemia, coronary artery disease and status post CABG went to the emergency department when she found him unresponsive at home and checked his blood sugar but it was undetectable on the machine. Wife reported progressive worsening generalized weakness, associated with one week duration of intermittent nausea, nonbloody vomiting and nonbloody diarrhea, early satiety, poor oral intake, significant peripheral edema. In the ED, pt was found to be lethargic/difficult to arouse, hypotensive with systolic blood pressure in 80s to 90s, hypothermic at 93.2 F, heart rate in 40s.   CK D. stage V Dr. Candis Musa to set up, vascular access placement.Vein mapping done and plan for [name] AVF on tomorrow 10/11/12  Home health therapy starts on 5/15   Anemia - Fe deficient  given IV iron and plan was to begin Aranesp per nephrology - has not been able to set up a nephrology appt per wife   Review of Systems:  Review of Systems  Constitutional: Positive for malaise/fatigue. Negative for fever and chills.  Respiratory: Negative for cough, shortness of breath and wheezing.   Cardiovascular: Negative for chest pain.  Gastrointestinal: Negative for abdominal pain, diarrhea, constipation and blood in stool.  Genitourinary: Negative for dysuria.  Musculoskeletal: Negative for myalgias, back pain and joint pain.  Neurological: Negative for weakness.     Past Medical History  Diagnosis Date  . Hypertension   . Diabetes mellitus   . Chronic kidney disease     was on dialysis in  July 2011  . Hypothyroidism   . CHF (congestive heart failure)   . Anemia   . Blind left eye   . Sleep apnea     not on CPAP  . Coronary artery disease   . Arthritis   . Sarcoidosis     Hx:of  . Diabetic retinopathy     Hx: of bilateral  . Edema   . Hyperlipidemia   . 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 Atherosclerosis of native arteries of the extremities, unspecified   . Eczema     Hx: of   Past Surgical History  Procedure Laterality Date  . Cardiac surgery      total of 6 surgeries, 5 related to mrsa  . Coronary artery bypass graft  06/2009  . Cataract surgery      Hx: of  . Debridements      Hx: of secondary to MRSA  . Av fistula placement Right 10/11/2012    Procedure: ARTERIOVENOUS (AV) FISTULA CREATION;  Surgeon: Larina Earthly, MD;  Location: [facility] OR;  Service: Vascular;  Laterality: Right;   Social History:   reports that he has never smoked. He has never used smokeless tobacco. He reports that he does not drink alcohol or use illicit drugs.  Family History  Problem Relation Age of Onset  . COPD Mother   . COPD Father     Medications: Patient's Medications  New Prescriptions   OXYCODONE (ROXICODONE) 5 MG IMMEDIATE RELEASE TABLET  Take 1 tablet (5 mg total) by mouth every 4 (four) hours as needed for pain.  Previous Medications   ALLOPURINOL (ZYLOPRIM) 100 MG TABLET    Take 100 mg by mouth 3 (three) times daily.    AMLODIPINE (NORVASC) 10 MG TABLET    Take 10 mg by mouth daily.   ASPIRIN 325 MG TABLET    Take 325 mg by mouth daily.   CALCIUM CARBONATE-VITAMIN D (CALCIUM + D PO)    Take 1 tablet by mouth 2 (two) times daily.   CRANBERRY 300 MG TABLET    Take 300 mg by mouth 2 (two) times daily.   FERROUS SULFATE 325 (65 FE) MG TABLET    Take 325 mg by mouth daily with breakfast.   FUROSEMIDE (LASIX) 40 MG TABLET    Take 40 mg by mouth 2 (two) times daily.   INSULIN ASPART (NOVOLOG) 100 UNIT/ML INJECTION    Inject 2-8 Units into the  skin 3 (three) times daily with meals. Take none if blood sugar < 150, 151-200 2 units, 201-250 4 units, 251-300 6 units, 301-350 8 units   INSULIN GLARGINE (LANTUS) 100 UNIT/ML INJECTION    Inject 35 Units into the skin every morning.   LEVOTHYROXINE (SYNTHROID, LEVOTHROID) 112 MCG TABLET    Take 112 mcg by mouth daily.   MAGNESIUM OXIDE (MAG-OX) 400 MG TABLET    Take 400 mg by mouth daily.   METOPROLOL (LOPRESSOR) 100 MG TABLET    Take 0.5 tablets (50 mg total) by mouth 2 (two) times daily.   PREDNISONE (DELTASONE) 1 MG TABLET    Take 1 mg by mouth 2 (two) times daily.  Modified Medications   No medications on file  Discontinued Medications   No medications on file     Physical Exam:  Filed Vitals:   10/10/12 1431  BP: 118/62  Pulse: 60  Temp: 98 F (36.7 C)  TempSrc: Oral  Resp: 14  Height: 5\' 10"  (1.778 m)  Weight: 249 lb 6.4 oz (113.127 kg)    Physical Exam  Constitutional: He appears well-developed and well-nourished. No distress.  Cardiovascular: Normal rate, regular rhythm and normal heart sounds.   Pulmonary/Chest: Effort normal and breath sounds normal.  Abdominal: Soft. Bowel sounds are normal.  Musculoskeletal: Normal range of motion. He exhibits no edema and no tenderness.  Skin: He is not diaphoretic.     Labs reviewed: Basic Metabolic Panel:  Recent Labs  16/10/96 1732  10/05/12 0340 10/06/12 0440 10/07/12 0554 10/08/12 0530 10/11/12 0920  NA 135  --  136 132* 135 134* 138  K 3.7  --  4.3 4.9 4.0 3.6 4.2  CL 96  --  97 93* 96 97  --   CO2 24  --  27 24 24 25   --   GLUCOSE 114*  --  165* 400* 259* 208* 121*  BUN 75*  --  76* 85* 95* 93*  --   CREATININE 4.59*  < > 4.60* 5.17* 4.71* 4.40*  --   CALCIUM 9.1  --  8.4 8.5 8.1* 8.1*  --   MG  --   --  2.2  --   --   --   --   PHOS  --   < > 5.1* 5.0* 4.4 4.7*  --   TSH  --   --  0.875  --   --   --   --   < > = values in this interval not displayed. Liver Function  Tests:  Recent Labs   10/04/12 1732 10/05/12 0340 10/06/12 0440 10/07/12 0554 10/08/12 0530  AST 19 17  --   --   --   ALT 12 10  --   --   --   ALKPHOS 95 90  --   --   --   BILITOT 0.1* 0.2*  --   --   --   PROT 7.0 6.6  --   --   --   ALBUMIN 3.4* 3.3* 3.4* 3.2* 3.1*   CBC:  Recent Labs  10/04/12 1732  10/05/12 0340 10/06/12 0440 10/07/12 0554 10/11/12 0920  WBC 7.5  < > 7.7 7.8 8.3  --   NEUTROABS 5.3  --   --   --   --   --   HGB 10.6*  < > 9.7* 9.5* 9.2* 10.2*  HCT 31.4*  < > 29.9* 28.3* 26.8* 30.0*  MCV 89.7  < > 89.8 88.7 87.6  --   PLT 289  < > 270 264 273  --   < > = values in this interval not displayed.      Assessment/Plan CKD (chronic kidney disease) Will refer to nephology   Encephalopathy, metabolic Resolved   Anemia Cont iron will follow up anemia  Type II or unspecified type diabetes mellitus with renal manifestations, uncontrolled(250.42) Still having episodes of hypoglycemia in the morning- will have pt stop taking HS dosing of novolog and cont to monitor blood glucose    Follow up in 2 weeks

## 2012-10-10 NOTE — Patient Instructions (Addendum)
Stop taking night time novolog  Still take blood sugars and record  Metamucil/benefiber for diarrhea or constipation   Follow up in the office in 3 weeks

## 2012-10-10 NOTE — Progress Notes (Signed)
Your patient has screened at an elevated risk for Obstructive Sleep Apnea using the STOP-bang Tool during a pre-surgical visit. A score of 4 or greater is an elevated risk.  10/10/12 1221  OBSTRUCTIVE SLEEP APNEA  Have you ever been diagnosed with sleep apnea through a sleep study? No  Do you snore loudly (loud enough to be heard through closed doors)?  1  Do you often feel tired, fatigued, or sleepy during the daytime? 0  Has anyone observed you stop breathing during your sleep? 0  Do you have, or are you being treated for high blood pressure? 1  BMI more than 35 kg/m2? 0  Age over 38 years old? 1  Neck circumference greater than 40 cm/18 inches? 0  Gender: 1  Obstructive Sleep Apnea Score 4  Score 4 or greater  Results sent to PCP

## 2012-10-10 NOTE — Progress Notes (Signed)
Pt denies SOB, chest pain, and being under the care of a cardiologist. According to patient spouse, a cardiac cath was completed in January 2011 at [facility] in [facility] but denies ever having a stress test. Dr. Leanord Hawking, pt PCP, made aware of the pt results of the STOP-Bang Assessment Tool which screens for Obstructive Sleep Apnea.Pt advised to stop taking Aspirin, Coumadin, Plavix, Effient, and herbal medications (cranberry tablets). Do not take any NSAIDs ie: Ibuprofen, Advil, Naproxen or any medication containing Aspirin. Pt was advised to arrive at [facility] at 7:30AM but states that they were advised by a member of the staff from Dr. Bosie Helper office to report at 8:30.

## 2012-10-11 ENCOUNTER — Encounter (HOSPITAL_COMMUNITY): Payer: Self-pay | Admitting: Anesthesiology

## 2012-10-11 ENCOUNTER — Ambulatory Visit (HOSPITAL_COMMUNITY)
Admission: RE | Admit: 2012-10-11 | Discharge: 2012-10-11 | Disposition: A | Discharge status: Home / Self Care | Payer: Medicare Other | Source: Ambulatory Visit | Attending: Vascular Surgery | Admitting: Vascular Surgery

## 2012-10-11 ENCOUNTER — Encounter (HOSPITAL_COMMUNITY)
Admission: RE | Disposition: A | Discharge status: Home / Self Care | Payer: Self-pay | Source: Ambulatory Visit | Attending: Vascular Surgery

## 2012-10-11 ENCOUNTER — Ambulatory Visit (HOSPITAL_COMMUNITY): Payer: Medicare Other | Admitting: Anesthesiology

## 2012-10-11 ENCOUNTER — Telehealth: Payer: Self-pay | Admitting: Vascular Surgery

## 2012-10-11 DIAGNOSIS — G4730 Sleep apnea, unspecified: Secondary | ICD-10-CM | POA: Insufficient documentation

## 2012-10-11 DIAGNOSIS — N186 End stage renal disease: Secondary | ICD-10-CM | POA: Insufficient documentation

## 2012-10-11 DIAGNOSIS — Z7901 Long term (current) use of anticoagulants: Secondary | ICD-10-CM | POA: Insufficient documentation

## 2012-10-11 DIAGNOSIS — E039 Hypothyroidism, unspecified: Secondary | ICD-10-CM | POA: Insufficient documentation

## 2012-10-11 DIAGNOSIS — Z7982 Long term (current) use of aspirin: Secondary | ICD-10-CM | POA: Insufficient documentation

## 2012-10-11 DIAGNOSIS — I2510 Atherosclerotic heart disease of native coronary artery without angina pectoris: Secondary | ICD-10-CM | POA: Insufficient documentation

## 2012-10-11 DIAGNOSIS — I120 Hypertensive chronic kidney disease with stage 5 chronic kidney disease or end stage renal disease: Secondary | ICD-10-CM | POA: Insufficient documentation

## 2012-10-11 DIAGNOSIS — Z951 Presence of aortocoronary bypass graft: Secondary | ICD-10-CM | POA: Insufficient documentation

## 2012-10-11 DIAGNOSIS — Z79899 Other long term (current) drug therapy: Secondary | ICD-10-CM | POA: Insufficient documentation

## 2012-10-11 DIAGNOSIS — D649 Anemia, unspecified: Secondary | ICD-10-CM | POA: Insufficient documentation

## 2012-10-11 DIAGNOSIS — H5440 Blindness, one eye, unspecified eye: Secondary | ICD-10-CM | POA: Insufficient documentation

## 2012-10-11 DIAGNOSIS — Z794 Long term (current) use of insulin: Secondary | ICD-10-CM | POA: Insufficient documentation

## 2012-10-11 DIAGNOSIS — I509 Heart failure, unspecified: Secondary | ICD-10-CM | POA: Insufficient documentation

## 2012-10-11 DIAGNOSIS — E119 Type 2 diabetes mellitus without complications: Secondary | ICD-10-CM | POA: Insufficient documentation

## 2012-10-11 DIAGNOSIS — N189 Chronic kidney disease, unspecified: Secondary | ICD-10-CM

## 2012-10-11 HISTORY — DX: Type 2 diabetes mellitus with unspecified diabetic retinopathy without macular edema: E11.319

## 2012-10-11 HISTORY — PX: AV FISTULA PLACEMENT: SHX1204

## 2012-10-11 HISTORY — DX: Dermatitis, unspecified: L30.9

## 2012-10-11 HISTORY — DX: Sarcoidosis, unspecified: D86.9

## 2012-10-11 HISTORY — DX: Unspecified osteoarthritis, unspecified site: M19.90

## 2012-10-11 LAB — POCT I-STAT 4, (NA,K, GLUC, HGB,HCT)
Glucose, Bld: 121 mg/dL — ABNORMAL HIGH (ref 70–99)
HCT: 30 % — ABNORMAL LOW (ref 39.0–52.0)
Hemoglobin: 10.2 g/dL — ABNORMAL LOW (ref 13.0–17.0)
Potassium: 4.2 mEq/L (ref 3.5–5.1)

## 2012-10-11 LAB — CULTURE, BLOOD (ROUTINE X 2): Culture: NO GROWTH

## 2012-10-11 LAB — GLUCOSE, CAPILLARY

## 2012-10-11 SURGERY — ARTERIOVENOUS (AV) FISTULA CREATION
Anesthesia: Monitor Anesthesia Care | Site: Arm Upper | Laterality: Right | Wound class: Clean

## 2012-10-11 MED ORDER — OXYCODONE HCL 5 MG/5ML PO SOLN: 5.0000 mg | Freq: Once | ORAL | Status: DC | PRN

## 2012-10-11 MED ORDER — 0.9 % SODIUM CHLORIDE (POUR BTL) OPTIME
TOPICAL | Status: DC | PRN
  Administered 2012-10-11: 1000 mL

## 2012-10-11 MED ORDER — OXYCODONE HCL 5 MG PO TABS: 5.0000 mg | ORAL_TABLET | ORAL | Status: DC | PRN

## 2012-10-11 MED ORDER — MUPIROCIN 2 % EX OINT
TOPICAL_OINTMENT | CUTANEOUS | Status: AC
  Filled 2012-10-11: qty 22

## 2012-10-11 MED ORDER — SODIUM CHLORIDE 0.9 % IV SOLN
INTRAVENOUS | Status: DC
  Administered 2012-10-11: 10:00:00 via INTRAVENOUS

## 2012-10-11 MED ORDER — LIDOCAINE-EPINEPHRINE 0.5 %-1:200000 IJ SOLN
INTRAMUSCULAR | Status: AC
  Filled 2012-10-11: qty 1

## 2012-10-11 MED ORDER — FENTANYL CITRATE 0.05 MG/ML IJ SOLN: 25.0000 ug | INTRAMUSCULAR | Status: DC | PRN

## 2012-10-11 MED ORDER — MUPIROCIN 2 % EX OINT: TOPICAL_OINTMENT | Freq: Two times a day (BID) | CUTANEOUS | Status: DC

## 2012-10-11 MED ORDER — OXYCODONE HCL 5 MG PO TABS: 5.0000 mg | ORAL_TABLET | Freq: Once | ORAL | Status: DC | PRN

## 2012-10-11 MED ORDER — METOCLOPRAMIDE HCL 5 MG/ML IJ SOLN: 10.0000 mg | Freq: Once | INTRAMUSCULAR | Status: DC | PRN

## 2012-10-11 MED ORDER — PROPOFOL INFUSION 10 MG/ML OPTIME
INTRAVENOUS | Status: DC | PRN
  Administered 2012-10-11: 50 ug/kg/min via INTRAVENOUS

## 2012-10-11 MED ORDER — LIDOCAINE-EPINEPHRINE 0.5 %-1:200000 IJ SOLN
INTRAMUSCULAR | Status: DC | PRN
  Administered 2012-10-11: 50 mL

## 2012-10-11 MED ORDER — FENTANYL CITRATE 0.05 MG/ML IJ SOLN
INTRAMUSCULAR | Status: DC | PRN
  Administered 2012-10-11: 25 ug via INTRAVENOUS
  Administered 2012-10-11: 50 ug via INTRAVENOUS

## 2012-10-11 MED ORDER — SODIUM CHLORIDE 0.9 % IR SOLN
Status: DC | PRN
  Administered 2012-10-11: 10:00:00

## 2012-10-11 SURGICAL SUPPLY — 37 items
BENZOIN TINCTURE PRP APPL 2/3 (GAUZE/BANDAGES/DRESSINGS) ×2 IMPLANT
CANISTER SUCTION 2500CC (MISCELLANEOUS) ×2 IMPLANT
CLIP LIGATING EXTRA MED SLVR (CLIP) ×2 IMPLANT
CLIP LIGATING EXTRA SM BLUE (MISCELLANEOUS) ×2 IMPLANT
CLOTH BEACON ORANGE TIMEOUT ST (SAFETY) ×2 IMPLANT
COVER PROBE W GEL 5X96 (DRAPES) ×2 IMPLANT
COVER SURGICAL LIGHT HANDLE (MISCELLANEOUS) ×2 IMPLANT
DECANTER SPIKE VIAL GLASS SM (MISCELLANEOUS) ×2 IMPLANT
ELECT REM PT RETURN 9FT ADLT (ELECTROSURGICAL) ×2
ELECTRODE REM PT RTRN 9FT ADLT (ELECTROSURGICAL) ×1 IMPLANT
GEL ULTRASOUND 20GR AQUASONIC (MISCELLANEOUS) IMPLANT
GLOVE BIOGEL PI IND STRL 7.0 (GLOVE) ×2 IMPLANT
GLOVE BIOGEL PI IND STRL 7.5 (GLOVE) ×1 IMPLANT
GLOVE BIOGEL PI INDICATOR 7.0 (GLOVE) ×2
GLOVE BIOGEL PI INDICATOR 7.5 (GLOVE) ×1
GLOVE SS BIOGEL STRL SZ 6.5 (GLOVE) ×1 IMPLANT
GLOVE SS BIOGEL STRL SZ 7.5 (GLOVE) ×1 IMPLANT
GLOVE SUPERSENSE BIOGEL SZ 6.5 (GLOVE) ×1
GLOVE SUPERSENSE BIOGEL SZ 7.5 (GLOVE) ×1
GLOVE SURG SS PI 7.5 STRL IVOR (GLOVE) ×2 IMPLANT
GOWN PREVENTION PLUS XLARGE (GOWN DISPOSABLE) ×2 IMPLANT
GOWN STRL NON-REIN LRG LVL3 (GOWN DISPOSABLE) ×4 IMPLANT
KIT BASIN OR (CUSTOM PROCEDURE TRAY) ×2 IMPLANT
KIT ROOM TURNOVER OR (KITS) ×2 IMPLANT
NS IRRIG 1000ML POUR BTL (IV SOLUTION) ×2 IMPLANT
PACK CV ACCESS (CUSTOM PROCEDURE TRAY) ×2 IMPLANT
PAD ARMBOARD 7.5X6 YLW CONV (MISCELLANEOUS) ×4 IMPLANT
SPONGE GAUZE 4X4 12PLY (GAUZE/BANDAGES/DRESSINGS) ×2 IMPLANT
STRIP CLOSURE SKIN 1/2X4 (GAUZE/BANDAGES/DRESSINGS) ×2 IMPLANT
SUT PROLENE 6 0 CC (SUTURE) ×2 IMPLANT
SUT VIC AB 3-0 SH 27 (SUTURE) ×1
SUT VIC AB 3-0 SH 27X BRD (SUTURE) ×1 IMPLANT
TAPE CLOTH SURG 4X10 WHT LF (GAUZE/BANDAGES/DRESSINGS) ×2 IMPLANT
TOWEL OR 17X24 6PK STRL BLUE (TOWEL DISPOSABLE) ×2 IMPLANT
TOWEL OR 17X26 10 PK STRL BLUE (TOWEL DISPOSABLE) ×2 IMPLANT
UNDERPAD 30X30 INCONTINENT (UNDERPADS AND DIAPERS) ×2 IMPLANT
WATER STERILE IRR 1000ML POUR (IV SOLUTION) ×2 IMPLANT

## 2012-10-11 NOTE — Interval H&P Note (Signed)
History and Physical Interval Note:  10/11/2012 9:48 AM  Jack Chung  has presented today for surgery, with the diagnosis of ESRD  The various methods of treatment have been discussed with the patient and family. After consideration of risks, benefits and other options for treatment, the patient has consented to  Procedure(s): ARTERIOVENOUS (AV) FISTULA CREATION (Right) as a surgical intervention .  The patient's history has been reviewed, patient examined, no change in status, stable for surgery.  I have reviewed the patient's chart and labs.  Questions were answered to the patient's satisfaction.     [name]

## 2012-10-11 NOTE — Interval H&P Note (Deleted)
History and Physical Interval Note:  10/11/2012 9:47 AM  Jack Chung  has presented today for surgery, with the diagnosis of ESRD  The various methods of treatment have been discussed with the patient and family. After consideration of risks, benefits and other options for treatment, the patient has consented to  Procedure(s): ARTERIOVENOUS (AV) FISTULA CREATION (Right) as a surgical intervention .  The patient's history has been reviewed, patient examined, no change in status, stable for surgery.  I have reviewed the patient's chart and labs.  Questions were answered to the patient's satisfaction.     Larwence Tu

## 2012-10-11 NOTE — H&P (View-Only) (Deleted)
Patient evaluated for long-term disease management services with [facility] Care Management Program as a benefit of his [employer]. Met with wife and patient at bedside to explain services. Consents signed and confirmed contact information. Patient will receive a post discharge transition of care call and will be evaluated for monthly home visits for assessments and for education. Left [facility] Care Management packet and contact information with wife.      Raiford Noble, MSN-Ed, RN,BSN, [facility], [phone]

## 2012-10-11 NOTE — Transfer of Care (Signed)
Immediate Anesthesia Transfer of Care Note  Patient: Jack Chung  Procedure(s) Performed: Procedure(s): ARTERIOVENOUS (AV) FISTULA CREATION (Right)  Patient Location: PACU  Anesthesia Type:MAC  Level of Consciousness: awake, alert  and oriented  Airway & Oxygen Therapy: Patient Spontanous Breathing and Patient connected to nasal cannula oxygen  Post-op Assessment: Post -op Vital signs reviewed and stable  Post vital signs: Reviewed and stable  Complications: No apparent anesthesia complications

## 2012-10-11 NOTE — Op Note (Signed)
OPERATIVE REPORT  DATE OF SURGERY: 10/11/2012  PATIENT: Jack Chung, 72 y.o. male MRN: [id number]  DOB: 03/11/1941  PRE-OPERATIVE DIAGNOSIS: Chronic renal insufficiency  POST-OPERATIVE DIAGNOSIS:  Same  PROCEDURE: Right upper arm brachiocephalic AV fistula  SURGEON:  Gretta Began, M.D.  PHYSICIAN ASSISTANT: Roczniak  ANESTHESIA:  Local with sedation  EBL: Minimal ml  Total I/O In: 250 [I.V.:250] Out: -   BLOOD ADMINISTERED: None  DRAINS: None  SPECIMEN: None  COUNTS CORRECT:  YES  PLAN OF CARE: PACU   PATIENT DISPOSITION:  PACU - hemodynamically stable  PROCEDURE DETAILS: The patient was taken to the operating placed supine position where the area of the right arm was prepped in a sterile fashion. SonoSite ultrasound was used to visualize the vein which was small in the forearm was good caliber at the antecubital space and proximally. Using local anesthesia incision was made over the antecubital space to isolate the brachial artery which was of large caliber and the antecubital vein extending into the cephalic vein. This was also very good caliber. A trip to her branches of the vein were ligated with 304 0 silk ties and divided. The vein was ligated distally and mobilized the level of the brachial artery. The brachial artery was occluded proximally and distally was opened with an 11 blade and extended longitudinally with Potts scissors. The vein was cut to appropriate length and spatulated and sewn end-to-side to the artery with a running 6-0 Prolene suture. Clamps removed and excellent thrill was noted. Wound irrigated with saline. Electrocautery was used for hemostasis. The wound is closed with 3-0 Vicryl in the subcutaneous and subcuticular tissue. Benzoin and Steri-Strips were applied.   Gretta Began, M.D. 10/11/2012 11:37 AM

## 2012-10-11 NOTE — Progress Notes (Signed)
Pt has a 1-view CXR in EPIC.  Spoke with Dr. Gelene Mink (anesthesiologist) and he states that a 1-view is all that is needed today.  Also told him about pt's EKG that shows a right bundle branch block on 4/30 and 5/1 and he stated that it was fine.

## 2012-10-11 NOTE — Interval H&P Note (Signed)
History and Physical Interval Note:  10/11/2012 9:49 AM  Jack Chung  has presented today for surgery, with the diagnosis of ESRD  The various methods of treatment have been discussed with the patient and family. After consideration of risks, benefits and other options for treatment, the patient has consented to  Procedure(s): ARTERIOVENOUS (AV) FISTULA CREATION (Right) as a surgical intervention .  The patient's history has been reviewed, patient examined, no change in status, stable for surgery.  I have reviewed the patient's chart and labs.  Questions were answered to the patient's satisfaction.     Maijor Hornig

## 2012-10-11 NOTE — Anesthesia Preprocedure Evaluation (Signed)
Anesthesia Evaluation  Patient identified by MRN, date of birth, ID band Patient awake    Reviewed: Allergy & Precautions, H&P , NPO status , Patient's Chart, lab work & pertinent test results, reviewed documented beta blocker date and time   Airway Mallampati: II TM Distance: >3 FB Neck ROM: full    Dental   Pulmonary sleep apnea ,  breath sounds clear to auscultation        Cardiovascular hypertension, On Medications and On Home Beta Blockers + CAD, + CABG, + Peripheral Vascular Disease and +CHF negative cardio ROS  Rhythm:regular     Neuro/Psych negative neurological ROS  negative psych ROS   GI/Hepatic negative GI ROS, Neg liver ROS,   Endo/Other  negative endocrine ROSdiabetes, Insulin DependentHypothyroidism   Renal/GU ESRFRenal disease  negative genitourinary   Musculoskeletal   Abdominal   Peds  Hematology  (+) anemia ,   Anesthesia Other Findings See surgeon's H&P   Reproductive/Obstetrics negative OB ROS                           Anesthesia Physical Anesthesia Plan  ASA: III  Anesthesia Plan: MAC   Post-op Pain Management:    Induction:   Airway Management Planned: Simple Face Mask  Additional Equipment:   Intra-op Plan:   Post-operative Plan:   Informed Consent: I have reviewed the patients History and Physical, chart, labs and discussed the procedure including the risks, benefits and alternatives for the proposed anesthesia with the patient or authorized representative who has indicated his/her understanding and acceptance.   Dental Advisory Given  Plan Discussed with: CRNA and Surgeon  Anesthesia Plan Comments:         Anesthesia Quick Evaluation

## 2012-10-11 NOTE — H&P (View-Only) (Signed)
[facility] [place] CONSULT NOTE 10/06/2012 DOB: 05/27/1941 MRN : [id number]  CC: CKD Referring Physician:Joseph A Coladonato, MD   History of Present Illness: Jack Chung is a 72 y.o. male  has a past medical history of Hypertension; Diabetes mellitus; Chronic kidney disease; Hypothyroidism; CHF (congestive heart failure); Anemia; Blind left eye; Sleep apnea; and Coronary artery disease. He has a history of CKD and was on dialysis short term in 2011.  We have been asked to create dialysis access for future dialysis needs.  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        Past Medical History  Diagnosis Date  . Hypertension   . Diabetes mellitus   . Chronic kidney disease     was on dialysis in July 2011  . Hypothyroidism   . CHF (congestive heart failure)   . Anemia   . Blind left eye   . Sleep apnea     not on CPAP  . Coronary artery disease     Past Surgical History  Procedure Laterality Date  . Cardiac surgery      total of 6 surgeries, 5 related to mrsa  . Coronary artery bypass graft  06/2009     ROS: [x] Positive  [ ] Denies    General: [ ] Weight loss, [ ] Fever, [ ] chills Neurologic: [ ] Dizziness, [ ] Blackouts, [ ] Seizure [ ] Stroke, [ ] "Mini stroke", [ ] Slurred speech, [ ] Temporary blindness; [ ] weakness in arms or legs, [ ] Hoarseness [x] partial blindness in left eye Cardiac: [ ] Chest pain/pressure, [ ] Shortness of breath at rest [x ] Shortness of breath with exertion, [ ] Atrial fibrillation or irregular heartbeat Vascular: [ ] Pain in legs with walking, [ ] Pain in legs at rest, [ ] Pain in legs at night,  [ ] Non-healing ulcer, [ ] Blood clot in vein/DVT,   Pulmonary: [ ] Home oxygen, [ ] Productive cough, [ ] Coughing up blood, [ ] Asthma,  [ ] Wheezing Musculoskeletal:  [x ] Arthritis, [ ] Low back pain, [ ] Joint pain [x] peripheral neuropathy bilateral feet Hematologic: [ ] Easy Bruising, [ ] Anemia; [ ]  Hepatitis Gastrointestinal: [ ] Blood in stool, [ ] Gastroesophageal Reflux/heartburn, [ ] Trouble swallowing Urinary: [x ] chronic Kidney disease, [ ] on HD - [ ] MWF or [ ] TTHS, [ ] Burning with urination, [ ] Difficulty urinating Skin: [ ] Rashes, [ ] Wounds Psychological: [ ] Anxiety, [ ] Depression  Social History History  Substance Use Topics  . Smoking status: Never Smoker   . Smokeless tobacco: Never Used  . Alcohol Use: No    Family History Family History  Problem Relation Age of Onset  . COPD Mother   . COPD Father     Allergies  Allergen Reactions  . Flu Virus Vaccine     Gets the flu  . Lipitor (Atorvastatin)     Leg cramps    Current Facility-Administered Medications  Medication Dose Route Frequency Provider Last Rate Last Dose  . acetaminophen (TYLENOL) tablet 650 mg  650 mg Oral Q6H PRN Anastassia Doutova, MD       Or  . acetaminophen (TYLENOL) suppository 650 mg  650 mg Rectal Q6H PRN Anastassia Doutova, MD      . aspirin tablet 325 mg  325 mg Oral Daily Anastassia Doutova, MD     325 mg at 10/06/12 0916  . dextrose (GLUTOSE) 40 % oral gel 37.5 g  1 Tube Oral PRN Anastassia Doutova, MD      . enoxaparin (LOVENOX) injection 30 mg  30 mg Subcutaneous Q24H Justin Marshall Legge, RPH   30 mg at 10/06/12 0009  . HYDROcodone-acetaminophen (NORCO/VICODIN) 5-325 MG per tablet 1-2 tablet  1-2 tablet Oral Q4H PRN Anastassia Doutova, MD      . hydrocortisone sodium succinate (SOLU-CORTEF) 100 mg/2 mL injection 100 mg  100 mg Intravenous Q6H David Glick, MD      . insulin aspart (novoLOG) injection 0-9 Units  0-9 Units Subcutaneous TID WC Iskra M Myers, MD   9 Units at 10/06/12 0830  . levothyroxine (SYNTHROID, LEVOTHROID) tablet 112 mcg  112 mcg Oral QAC breakfast Anastassia Doutova, MD   112 mcg at 10/06/12 0830  . ondansetron (ZOFRAN) tablet 4 mg  4 mg Oral Q6H PRN Anastassia Doutova, MD       Or  . ondansetron (ZOFRAN) injection 4 mg  4 mg Intravenous Q6H PRN  Anastassia Doutova, MD      . sodium chloride 0.9 % injection 3 mL  3 mL Intravenous Q12H Anastassia Doutova, MD   3 mL at 10/06/12 0917     Imaging: Us Renal  10/05/2012  *RADIOLOGY REPORT*  Clinical Data: History of elevated creatinine level of 4.5. Evaluate for hydronephrosis.  RENAL/URINARY TRACT ULTRASOUND COMPLETE  Comparison:  None.  Findings:  Right Kidney:  Right renal length is 10.1 cm.  Left Kidney:  Left renal length is 10.9 cm.  There is evidence of chronic medical renal disease with cortical thinning bilaterally with increased echogenicity of the renal parenchyma with decreased differentiation between cortex and medulla.  There is no evidence of hydronephrosis involving either kidney.  No calculus is evident.  There is a small hypoechoic mass involving the upper pole of the left kidney.  This is hypoechoic with a few internal echoes.  There is slight posterior acoustic enhancement on some of the images. This is felt to represent probable small cyst with some debris within it.  A small mass measures 1.0 x 1.3 x 1.1 cm.  Bladder:  No urinary bladder lesion is identified.  IMPRESSION: Normal renal lengths but there is evidence of chronic medical renal disease with cortical thinning bilaterally and increased echogenicity of renal parenchyma with decreased differentiation between cortex and medulla.  There is no evidence of hydronephrosis involving either kidney.  Small hypoechoic mass involving upper pole of the left kidney is hypoechoic with a few internal echoes most likely reflecting small cyst with some internal debris.  No internal solid nodular area was evident.   Original Report Authenticated By: David Call    Dg Chest Portable 1 View  10/04/2012  *RADIOLOGY REPORT*  Clinical Data: Hypoglycemia.  Weakness.  PORTABLE CHEST - 1 VIEW  Comparison: None.  Findings: The cardiopericardial silhouette is enlarged.  Single median sternotomy wire is present. Monitoring leads are projected over the  chest.  No airspace disease.  No effusion.  Suboptimal inspiration.  Basilar atelectasis.  IMPRESSION: Cardiomegaly without failure.   Original Report Authenticated By: Geoffrey Lamke, M.D.     Significant Diagnostic Studies: CBC Lab Results  Component Value Date   WBC 7.8 10/06/2012   HGB 9.5* 10/06/2012   HCT 28.3* 10/06/2012   MCV 88.7 10/06/2012   PLT 264 10/06/2012    BMET    Component Value Date/Time   NA 132* 10/06/2012 0440   K   4.9 10/06/2012 0440   CL 93* 10/06/2012 0440   CO2 24 10/06/2012 0440   GLUCOSE 400* 10/06/2012 0440   BUN 85* 10/06/2012 0440   CREATININE 5.17* 10/06/2012 0440   CALCIUM 8.5 10/06/2012 0440   GFRNONAA 10* 10/06/2012 0440   GFRAA 12* 10/06/2012 0440    COAG No results found for this basename: INR, PROTIME   No results found for this basename: PTT     Physical Examination BP Readings from Last 3 Encounters:  10/06/12 128/60  01/21/12 155/77   Temp Readings from Last 3 Encounters:  10/06/12 98.2 F (36.8 C) Oral  01/21/12 98.6 F (37 C) Oral   SpO2 Readings from Last 3 Encounters:  10/06/12 96%  01/21/12 97%   Pulse Readings from Last 3 Encounters:  10/06/12 83  01/21/12 73    General:  WDWN in NAD Gait: Normal HENT: WNL Eyes: Pupils equal Pulmonary: normal non-labored breathing , without Rales, rhonchi,  wheezing Cardiac: RRR, without  Murmurs, rubs or gallops; Well healed scar central chest s/p Coronary by-pass No carotid bruits Abdomen: soft, NT, no masses Skin: no rashes, ulcers noted Vascular Exam/Pulses:palpable radial bilaterally, IV have been started in both arm.  Extremities without ischemic changes, no Gangrene , no cellulitis; no open wounds;  Musculoskeletal: no muscle wasting or atrophy  Neurologic: A&O X 3; Appropriate Affect ;  SENSATION: normal; MOTOR FUNCTION: Pt has good and equal strength in all extremities - 5/5 Speech is fluent/normal  Non-Invasive Vascular Imaging: Pending vein mapping  ASSESSMENT/PLAN: CKD not  requiring dialysis currently Right arm AV fistula verses graft Monday.  IF d/c'd before then we can do this as an outpatient procedure.   Sherece Gambrill MAUREEN 10/06/2012 10:56 AM   

## 2012-10-11 NOTE — Telephone Encounter (Addendum)
Message copied by Rosalyn Charters on Wed Oct 11, 2012  1:16 PM ------      Message from: Marlowe Shores      Created: Wed Oct 11, 2012 11:15 AM       4 weeks - AVF F/U - Early ------  notified patient of fu appt. on 11-07-12 at 3:45

## 2012-10-11 NOTE — Anesthesia Postprocedure Evaluation (Signed)
Anesthesia Post Note  Patient: Jack Chung  Procedure(s) Performed: Procedure(s) (LRB): ARTERIOVENOUS (AV) FISTULA CREATION (Right)  Anesthesia type: MAC  Patient location: PACU  Post pain: Pain level controlled  Post assessment: Patient's Cardiovascular Status Stable  Last Vitals:  Filed Vitals:   10/11/12 1125  BP:   Pulse: 51  Temp: 36.4 C  Resp:     Post vital signs: Reviewed and stable  Level of consciousness: alert  Complications: No apparent anesthesia complications

## 2012-10-12 ENCOUNTER — Encounter (HOSPITAL_COMMUNITY): Payer: Self-pay | Admitting: Vascular Surgery

## 2012-10-16 ENCOUNTER — Ambulatory Visit: Payer: Medicare Other | Attending: Nephrology

## 2012-10-16 DIAGNOSIS — R279 Unspecified lack of coordination: Secondary | ICD-10-CM | POA: Insufficient documentation

## 2012-10-16 DIAGNOSIS — Z9181 History of falling: Secondary | ICD-10-CM | POA: Insufficient documentation

## 2012-10-16 DIAGNOSIS — IMO0001 Reserved for inherently not codable concepts without codable children: Secondary | ICD-10-CM | POA: Insufficient documentation

## 2012-10-18 DIAGNOSIS — E1165 Type 2 diabetes mellitus with hyperglycemia: Secondary | ICD-10-CM | POA: Insufficient documentation

## 2012-10-18 DIAGNOSIS — E1129 Type 2 diabetes mellitus with other diabetic kidney complication: Secondary | ICD-10-CM | POA: Insufficient documentation

## 2012-10-18 NOTE — Assessment & Plan Note (Signed)
Still having episodes of hypoglycemia in the morning- will have pt stop taking HS dosing of novolog and cont to monitor blood glucose

## 2012-10-18 NOTE — Assessment & Plan Note (Signed)
Cont iron will follow up anemia

## 2012-10-18 NOTE — Assessment & Plan Note (Signed)
Resolved

## 2012-10-18 NOTE — Assessment & Plan Note (Signed)
Will refer to nephology

## 2012-10-20 ENCOUNTER — Ambulatory Visit: Payer: Medicare Other

## 2012-10-24 ENCOUNTER — Ambulatory Visit: Payer: Medicare Other

## 2012-10-25 ENCOUNTER — Ambulatory Visit: Payer: Medicare Other | Admitting: Physical Therapy

## 2012-10-26 ENCOUNTER — Telehealth: Payer: Self-pay | Admitting: *Deleted

## 2012-10-26 ENCOUNTER — Ambulatory Visit: Payer: Medicare Other

## 2012-10-26 NOTE — Telephone Encounter (Signed)
Patient had a fall on 10/24/2012. Injury left leg behind left knee . Trouble with weight bare ing . Patient is not eating because of the pain has not eating in two days. Wife states that she is unsure what to do. Spoke with Dr Renato Gails and patient is to come into the office in the morning on 10/27/2012 at 9:30 AM

## 2012-10-27 ENCOUNTER — Ambulatory Visit: Payer: Medicare Other

## 2012-10-27 ENCOUNTER — Encounter: Payer: Self-pay | Admitting: Geriatric Medicine

## 2012-10-27 ENCOUNTER — Encounter: Payer: Self-pay | Admitting: Internal Medicine

## 2012-10-27 ENCOUNTER — Ambulatory Visit (INDEPENDENT_AMBULATORY_CARE_PROVIDER_SITE_OTHER): Payer: Medicare Other | Admitting: Internal Medicine

## 2012-10-27 VITALS — BP 150/72 | HR 71 | Temp 98.2°F | Resp 24 | Ht 70.0 in | Wt 245.0 lb

## 2012-10-27 DIAGNOSIS — R6881 Early satiety: Secondary | ICD-10-CM

## 2012-10-27 DIAGNOSIS — E1165 Type 2 diabetes mellitus with hyperglycemia: Secondary | ICD-10-CM

## 2012-10-27 DIAGNOSIS — E1129 Type 2 diabetes mellitus with other diabetic kidney complication: Secondary | ICD-10-CM

## 2012-10-27 DIAGNOSIS — D649 Anemia, unspecified: Secondary | ICD-10-CM

## 2012-10-27 MED ORDER — INSULIN GLARGINE 100 UNIT/ML ~~LOC~~ SOLN: 30.0000 [IU] | Freq: Every morning | SUBCUTANEOUS | Status: DC

## 2012-10-27 NOTE — Patient Instructions (Signed)
Reduce lantus to 30 units in the morning beginning tomorrow.   If still low in the evening, reduce again to 25 units the next day.

## 2012-10-27 NOTE — Progress Notes (Signed)
Patient ID: Jack Chung, male   DOB: [birth year], 72 y.o.   MRN: [id number] Code Status: DNR  Allergies  Allergen Reactions  . Flu Virus Vaccine     Gets the flu  . Lipitor (Atorvastatin)     Leg cramps    Chief Complaint  Patient presents with  . Medical Managment of Chronic Issues    patient fell during physical therapy    HPI: Patient is a 72 y.o.  seen in the office today for medical mgt of chronic diseases.  He has several active problems.    Went to PT for leg strengthening, had a fall, now pulled or tore his hamstring.  Hadn't walked in 2 days.  They gave him a walker yesterday.  Has been unable to eat much, comes back up.  Was said to be his gastroparesis from diabetes.  Glucose drops into the 30s at night and wife is up half the night getting glucose back up.  On lantus in am with novolog sliding scale.  He's not sleeping well.    Nephrologist said he needs an iron shot and a procrit.  Went to class on peritoneal dialysis which he could do 8-10 hrs overnight.  Has a fistula in his arm at this time.  They travel a lot.  Pt really does not want hemodialysis.    Takes the prednisone for gout and arthritis.  Did not tolerate being off for 3 wks.    Iron has slowed down his bowels, but still has diarrhea at times.    Yesterday had 3 chicken strips and last night an 8 oz can of soup, then vomited half of it up.  Will eat 1/2 of an egg and a half and a sausage.    Has had a little bit more confusion lately.  Not too noticeable she says.    Review of Systems:  Review of Systems  Constitutional: Negative for fever.  HENT: Negative for congestion.   Eyes: Negative for blurred vision.  Respiratory: Negative for cough and shortness of breath.   Cardiovascular: Negative for chest pain.       Had av fistula placed  Gastrointestinal: Negative for heartburn, constipation, blood in stool and melena.  Musculoskeletal: Positive for back pain and falls.  Skin: Negative for rash.   Neurological: Positive for tingling and sensory change. Negative for loss of consciousness and headaches.  Psychiatric/Behavioral: Positive for memory loss. The patient has insomnia.      Past Medical History  Diagnosis Date  . Hypertension   . Diabetes mellitus   . Chronic kidney disease     was on dialysis in July 2011  . Hypothyroidism   . CHF (congestive heart failure)   . Anemia   . Blind left eye   . Sleep apnea     not on CPAP  . Coronary artery disease   . Arthritis   . Sarcoidosis     Hx:of  . Diabetic retinopathy     Hx: of bilateral  . Edema   . Hyperlipidemia   . 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 Atherosclerosis of native arteries of the extremities, unspecified   . Eczema     Hx: of   Past Surgical History  Procedure Laterality Date  . Cardiac surgery      total of 6 surgeries, 5 related to mrsa  . Coronary artery bypass graft  06/2009  . Cataract surgery      Hx: of  .  Debridements      Hx: of secondary to MRSA  . Av fistula placement Right 10/11/2012    Procedure: ARTERIOVENOUS (AV) FISTULA CREATION;  Surgeon: Larina Earthly, MD;  Location: [facility] OR;  Service: Vascular;  Laterality: Right;   Social History:   reports that he has never smoked. He has never used smokeless tobacco. He reports that he does not drink alcohol or use illicit drugs.  Family History  Problem Relation Age of Onset  . COPD Mother   . COPD Father     Medications: Patient's Medications  New Prescriptions   No medications on file  Previous Medications   ALLOPURINOL (ZYLOPRIM) 100 MG TABLET    Take 100 mg by mouth 3 (three) times daily.    AMLODIPINE (NORVASC) 10 MG TABLET    Take 10 mg by mouth daily.   ASPIRIN 325 MG TABLET    Take 325 mg by mouth daily.   CALCIUM CARBONATE-VITAMIN D (CALCIUM + D PO)    Take 1 tablet by mouth 2 (two) times daily.   CRANBERRY 300 MG TABLET    Take 300 mg by mouth 2 (two) times daily.   FERROUS SULFATE 325 (65 FE)  MG TABLET    Take 325 mg by mouth daily with breakfast.   FUROSEMIDE (LASIX) 40 MG TABLET    Take 40 mg by mouth 2 (two) times daily.   INSULIN ASPART (NOVOLOG) 100 UNIT/ML INJECTION    Inject 2-8 Units into the skin 3 (three) times daily with meals. Take none if blood sugar < 150, 151-200 2 units, 201-250 4 units, 251-300 6 units, 301-350 8 units   INSULIN GLARGINE (LANTUS) 100 UNIT/ML INJECTION    Inject 35 Units into the skin every morning.   LEVOTHYROXINE (SYNTHROID, LEVOTHROID) 112 MCG TABLET    Take 112 mcg by mouth daily.   MAGNESIUM OXIDE (MAG-OX) 400 MG TABLET    Take 400 mg by mouth daily.   METOPROLOL (LOPRESSOR) 100 MG TABLET    Take 0.5 tablets (50 mg total) by mouth 2 (two) times daily.   OXYCODONE (ROXICODONE) 5 MG IMMEDIATE RELEASE TABLET    Take 1 tablet (5 mg total) by mouth every 4 (four) hours as needed for pain.   PREDNISONE (DELTASONE) 1 MG TABLET    Take 1 mg by mouth 2 (two) times daily.  Modified Medications   No medications on file  Discontinued Medications   No medications on file     Physical Exam:  Filed Vitals:   10/27/12 0921  BP: 150/72  Pulse: 71  Temp: 98.2 F (36.8 C)  TempSrc: Oral  Resp: 24  Height: 5\' 10"  (1.778 m)  Weight: 245 lb (111.131 kg)  SpO2: 95%  Physical Exam  Constitutional: He is oriented to person, place, and time.  Normal weight white male, several ecchymoses on face, arms, knees s/p fall  Cardiovascular: Normal rate, regular rhythm, normal heart sounds and intact distal pulses.   Pulmonary/Chest: Effort normal and breath sounds normal. No respiratory distress.  Abdominal: Soft. Bowel sounds are normal. He exhibits no distension.  Incisions from peritoneal dialysis surgery  Musculoskeletal: He exhibits edema.  Left knee  Neurological: He is alert and oriented to person, place, and time. No cranial nerve deficit.       Labs reviewed: Basic Metabolic Panel:  Recent Labs  16/10/96 1732  10/05/12 0340 10/06/12 0440  10/07/12 0554 10/08/12 0530 10/11/12 0920  NA 135  --  136 132* 135 134* 138  K 3.7  --  4.3 4.9 4.0 3.6 4.2  CL 96  --  97 93* 96 97  --   CO2 24  --  27 24 24 25   --   GLUCOSE 114*  --  165* 400* 259* 208* 121*  BUN 75*  --  76* 85* 95* 93*  --   CREATININE 4.59*  < > 4.60* 5.17* 4.71* 4.40*  --   CALCIUM 9.1  --  8.4 8.5 8.1* 8.1*  --   MG  --   --  2.2  --   --   --   --   PHOS  --   < > 5.1* 5.0* 4.4 4.7*  --   TSH  --   --  0.875  --   --   --   --   < > = values in this interval not displayed. Liver Function Tests:  Recent Labs  10/04/12 1732 10/05/12 0340 10/06/12 0440 10/07/12 0554 10/08/12 0530  AST 19 17  --   --   --   ALT 12 10  --   --   --   ALKPHOS 95 90  --   --   --   BILITOT 0.1* 0.2*  --   --   --   PROT 7.0 6.6  --   --   --   ALBUMIN 3.4* 3.3* 3.4* 3.2* 3.1*  CBC:  Recent Labs  10/04/12 1732  10/05/12 0340 10/06/12 0440 10/07/12 0554 10/11/12 0920  WBC 7.5  < > 7.7 7.8 8.3  --   NEUTROABS 5.3  --   --   --   --   --   HGB 10.6*  < > 9.7* 9.5* 9.2* 10.2*  HCT 31.4*  < > 29.9* 28.3* 26.8* 30.0*  MCV 89.7  < > 89.8 88.7 87.6  --   PLT 289  < > 270 264 273  --   < > = values in this interval not displayed. Assessment/Plan 1. Type II or unspecified type diabetes mellitus with renal manifestations, uncontrolled(250.42) --suspect he has some gastroparesis causing his early satiety  --may also be due to some uremia  - check gastric emptying study -having some lows especially at supper time -am postprandial and lunch at goal -advised his wife to decrease his lantus to 30 units and if he is still low, go down to 25, but to let us know what she does   2. Anemia --continue iron and procrit for anemia due to chronic kidney disease  3. Early satiety --possible gastroparesis--has most other complications of his diabetes at this point, so this would not surprise me - NM Gastric Emptying; Future -encouraged small frequent meals to help maintain  nutrition and prevent hypoglycemia  Labs/tests ordered:  Gastric emptying study

## 2012-10-30 ENCOUNTER — Encounter (HOSPITAL_COMMUNITY): Payer: Self-pay | Admitting: Physical Medicine and Rehabilitation

## 2012-10-30 ENCOUNTER — Inpatient Hospital Stay (HOSPITAL_COMMUNITY)
Admission: EM | Admit: 2012-10-30 | Discharge: 2012-11-08 | DRG: 291 | Disposition: A | Discharge status: Home / Self Care | Payer: Medicare Other | Attending: Internal Medicine | Admitting: Internal Medicine

## 2012-10-30 ENCOUNTER — Emergency Department (HOSPITAL_COMMUNITY): Payer: Medicare Other

## 2012-10-30 DIAGNOSIS — N058 Unspecified nephritic syndrome with other morphologic changes: Secondary | ICD-10-CM | POA: Diagnosis present

## 2012-10-30 DIAGNOSIS — N184 Chronic kidney disease, stage 4 (severe): Secondary | ICD-10-CM

## 2012-10-30 DIAGNOSIS — I120 Hypertensive chronic kidney disease with stage 5 chronic kidney disease or end stage renal disease: Secondary | ICD-10-CM | POA: Diagnosis present

## 2012-10-30 DIAGNOSIS — I5022 Chronic systolic (congestive) heart failure: Secondary | ICD-10-CM

## 2012-10-30 DIAGNOSIS — M109 Gout, unspecified: Secondary | ICD-10-CM | POA: Diagnosis present

## 2012-10-30 DIAGNOSIS — N289 Disorder of kidney and ureter, unspecified: Secondary | ICD-10-CM

## 2012-10-30 DIAGNOSIS — G4733 Obstructive sleep apnea (adult) (pediatric): Secondary | ICD-10-CM | POA: Diagnosis present

## 2012-10-30 DIAGNOSIS — I509 Heart failure, unspecified: Secondary | ICD-10-CM

## 2012-10-30 DIAGNOSIS — R0609 Other forms of dyspnea: Secondary | ICD-10-CM

## 2012-10-30 DIAGNOSIS — I5033 Acute on chronic diastolic (congestive) heart failure: Principal | ICD-10-CM | POA: Diagnosis present

## 2012-10-30 DIAGNOSIS — N185 Chronic kidney disease, stage 5: Secondary | ICD-10-CM

## 2012-10-30 DIAGNOSIS — R001 Bradycardia, unspecified: Secondary | ICD-10-CM

## 2012-10-30 DIAGNOSIS — I959 Hypotension, unspecified: Secondary | ICD-10-CM

## 2012-10-30 DIAGNOSIS — Z951 Presence of aortocoronary bypass graft: Secondary | ICD-10-CM

## 2012-10-30 DIAGNOSIS — Z8614 Personal history of Methicillin resistant Staphylococcus aureus infection: Secondary | ICD-10-CM

## 2012-10-30 DIAGNOSIS — D869 Sarcoidosis, unspecified: Secondary | ICD-10-CM

## 2012-10-30 DIAGNOSIS — E11319 Type 2 diabetes mellitus with unspecified diabetic retinopathy without macular edema: Secondary | ICD-10-CM | POA: Diagnosis present

## 2012-10-30 DIAGNOSIS — J189 Pneumonia, unspecified organism: Secondary | ICD-10-CM

## 2012-10-30 DIAGNOSIS — D638 Anemia in other chronic diseases classified elsewhere: Secondary | ICD-10-CM | POA: Diagnosis present

## 2012-10-30 DIAGNOSIS — I5032 Chronic diastolic (congestive) heart failure: Secondary | ICD-10-CM | POA: Diagnosis present

## 2012-10-30 DIAGNOSIS — E1129 Type 2 diabetes mellitus with other diabetic kidney complication: Secondary | ICD-10-CM

## 2012-10-30 DIAGNOSIS — E785 Hyperlipidemia, unspecified: Secondary | ICD-10-CM | POA: Diagnosis present

## 2012-10-30 DIAGNOSIS — M129 Arthropathy, unspecified: Secondary | ICD-10-CM | POA: Diagnosis present

## 2012-10-30 DIAGNOSIS — Z7982 Long term (current) use of aspirin: Secondary | ICD-10-CM

## 2012-10-30 DIAGNOSIS — E1139 Type 2 diabetes mellitus with other diabetic ophthalmic complication: Secondary | ICD-10-CM | POA: Diagnosis present

## 2012-10-30 DIAGNOSIS — R0902 Hypoxemia: Secondary | ICD-10-CM

## 2012-10-30 DIAGNOSIS — IMO0002 Reserved for concepts with insufficient information to code with codable children: Secondary | ICD-10-CM

## 2012-10-30 DIAGNOSIS — D649 Anemia, unspecified: Secondary | ICD-10-CM

## 2012-10-30 DIAGNOSIS — G9341 Metabolic encephalopathy: Secondary | ICD-10-CM

## 2012-10-30 DIAGNOSIS — G4730 Sleep apnea, unspecified: Secondary | ICD-10-CM

## 2012-10-30 DIAGNOSIS — I70209 Unspecified atherosclerosis of native arteries of extremities, unspecified extremity: Secondary | ICD-10-CM | POA: Diagnosis present

## 2012-10-30 DIAGNOSIS — I2510 Atherosclerotic heart disease of native coronary artery without angina pectoris: Secondary | ICD-10-CM | POA: Diagnosis present

## 2012-10-30 DIAGNOSIS — E162 Hypoglycemia, unspecified: Secondary | ICD-10-CM

## 2012-10-30 DIAGNOSIS — Z794 Long term (current) use of insulin: Secondary | ICD-10-CM

## 2012-10-30 DIAGNOSIS — E1165 Type 2 diabetes mellitus with hyperglycemia: Secondary | ICD-10-CM | POA: Diagnosis present

## 2012-10-30 DIAGNOSIS — E039 Hypothyroidism, unspecified: Secondary | ICD-10-CM

## 2012-10-30 DIAGNOSIS — H5440 Blindness, one eye, unspecified eye: Secondary | ICD-10-CM | POA: Diagnosis present

## 2012-10-30 DIAGNOSIS — Z79899 Other long term (current) drug therapy: Secondary | ICD-10-CM

## 2012-10-30 DIAGNOSIS — N189 Chronic kidney disease, unspecified: Secondary | ICD-10-CM

## 2012-10-30 DIAGNOSIS — N186 End stage renal disease: Secondary | ICD-10-CM | POA: Diagnosis present

## 2012-10-30 LAB — BASIC METABOLIC PANEL
BUN: 83 mg/dL — ABNORMAL HIGH (ref 6–23)
Chloride: 96 mEq/L (ref 96–112)
GFR calc non Af Amer: 12 mL/min — ABNORMAL LOW (ref >90)
Glucose, Bld: 118 mg/dL — ABNORMAL HIGH (ref 70–99)
Potassium: 4.2 mEq/L (ref 3.5–5.1)
Sodium: 137 mEq/L (ref 135–145)

## 2012-10-30 LAB — GLUCOSE, CAPILLARY: Glucose-Capillary: 171 mg/dL — ABNORMAL HIGH (ref 70–99)

## 2012-10-30 LAB — CREATININE, SERUM
Creatinine, Ser: 4.53 mg/dL — ABNORMAL HIGH (ref 0.50–1.35)
GFR calc non Af Amer: 12 mL/min — ABNORMAL LOW (ref >90)

## 2012-10-30 LAB — IRON AND TIBC
Iron: 25 ug/dL — ABNORMAL LOW (ref 42–135)
TIBC: 179 ug/dL — ABNORMAL LOW (ref 215–435)

## 2012-10-30 LAB — CBC
HCT: 30.4 % — ABNORMAL LOW (ref 39.0–52.0)
Hemoglobin: 10.1 g/dL — ABNORMAL LOW (ref 13.0–17.0)
MCH: 30.2 pg (ref 26.0–34.0)
MCHC: 32.6 g/dL (ref 30.0–36.0)
RBC: 3.35 MIL/uL — ABNORMAL LOW (ref 4.22–5.81)
RDW: 14.1 % (ref 11.5–15.5)
WBC: 7.8 10*3/uL (ref 4.0–10.5)

## 2012-10-30 LAB — URIC ACID: Uric Acid, Serum: 7.9 mg/dL — ABNORMAL HIGH (ref 4.0–7.8)

## 2012-10-30 MED ORDER — VANCOMYCIN HCL 10 G IV SOLR
2500.0000 mg | Freq: Once | INTRAVENOUS | Status: AC
  Administered 2012-10-30: 2500 mg via INTRAVENOUS
  Filled 2012-10-30: qty 2500

## 2012-10-30 MED ORDER — SODIUM CHLORIDE 0.9 % IJ SOLN
3.0000 mL | Freq: Two times a day (BID) | INTRAMUSCULAR | Status: DC
  Administered 2012-10-30 – 2012-11-08 (×18): 3 mL via INTRAVENOUS

## 2012-10-30 MED ORDER — POTASSIUM CHLORIDE CRYS ER 20 MEQ PO TBCR
EXTENDED_RELEASE_TABLET | ORAL | Status: AC
  Filled 2012-10-30: qty 1

## 2012-10-30 MED ORDER — FERROUS SULFATE 325 (65 FE) MG PO TABS
325.0000 mg | ORAL_TABLET | Freq: Every day | ORAL | Status: DC
  Administered 2012-10-30 – 2012-11-07 (×9): 325 mg via ORAL
  Filled 2012-10-30 (×10): qty 1

## 2012-10-30 MED ORDER — INSULIN GLARGINE 100 UNIT/ML ~~LOC~~ SOLN
30.0000 [IU] | Freq: Every morning | SUBCUTANEOUS | Status: DC
  Administered 2012-10-30 – 2012-11-01 (×3): 30 [IU] via SUBCUTANEOUS
  Filled 2012-10-30 (×5): qty 0.3

## 2012-10-30 MED ORDER — ASPIRIN 325 MG PO TABS
325.0000 mg | ORAL_TABLET | Freq: Every day | ORAL | Status: DC
  Administered 2012-10-30 – 2012-11-08 (×8): 325 mg via ORAL
  Filled 2012-10-30 (×10): qty 1

## 2012-10-30 MED ORDER — DEXTROSE 5 % IV SOLN
1.0000 g | Freq: Two times a day (BID) | INTRAVENOUS | Status: DC
  Administered 2012-10-30: 1 g via INTRAVENOUS
  Filled 2012-10-30 (×3): qty 1

## 2012-10-30 MED ORDER — PREDNISONE 1 MG PO TABS
1.0000 mg | ORAL_TABLET | Freq: Two times a day (BID) | ORAL | Status: DC
  Administered 2012-10-30 – 2012-11-08 (×18): 1 mg via ORAL
  Filled 2012-10-30 (×20): qty 1

## 2012-10-30 MED ORDER — FUROSEMIDE 10 MG/ML IJ SOLN
60.0000 mg | Freq: Three times a day (TID) | INTRAMUSCULAR | Status: DC
  Administered 2012-10-30 (×2): 60 mg via INTRAVENOUS
  Filled 2012-10-30: qty 6

## 2012-10-30 MED ORDER — DEXTROSE 5 % IV SOLN
1.0000 g | INTRAVENOUS | Status: DC
  Administered 2012-10-31 – 2012-11-01 (×2): 1 g via INTRAVENOUS
  Filled 2012-10-30 (×2): qty 1

## 2012-10-30 MED ORDER — DEXTROSE 5 % IV SOLN
1.0000 g | Freq: Once | INTRAVENOUS | Status: AC
  Administered 2012-10-30: 1 g via INTRAVENOUS
  Filled 2012-10-30: qty 1

## 2012-10-30 MED ORDER — ONDANSETRON HCL 4 MG PO TABS: 4.0000 mg | ORAL_TABLET | Freq: Four times a day (QID) | ORAL | Status: DC | PRN

## 2012-10-30 MED ORDER — FUROSEMIDE 10 MG/ML IJ SOLN: 40.0000 mg | Freq: Three times a day (TID) | INTRAMUSCULAR | Status: DC

## 2012-10-30 MED ORDER — FUROSEMIDE 10 MG/ML IJ SOLN
INTRAMUSCULAR | Status: AC
  Filled 2012-10-30: qty 8

## 2012-10-30 MED ORDER — FUROSEMIDE 80 MG PO TABS
80.0000 mg | ORAL_TABLET | Freq: Two times a day (BID) | ORAL | Status: DC
  Filled 2012-10-30 (×3): qty 1

## 2012-10-30 MED ORDER — MAGNESIUM OXIDE 400 (241.3 MG) MG PO TABS
400.0000 mg | ORAL_TABLET | Freq: Every day | ORAL | Status: DC
  Administered 2012-10-30 – 2012-11-04 (×5): 400 mg via ORAL
  Filled 2012-10-30 (×6): qty 1

## 2012-10-30 MED ORDER — PREDNISONE 20 MG PO TABS
40.0000 mg | ORAL_TABLET | Freq: Every day | ORAL | Status: DC
  Administered 2012-10-30: 40 mg via ORAL
  Filled 2012-10-30 (×2): qty 2

## 2012-10-30 MED ORDER — ONDANSETRON HCL 4 MG/2ML IJ SOLN: 4.0000 mg | Freq: Four times a day (QID) | INTRAMUSCULAR | Status: DC | PRN

## 2012-10-30 MED ORDER — POTASSIUM CHLORIDE CRYS ER 20 MEQ PO TBCR
20.0000 meq | EXTENDED_RELEASE_TABLET | Freq: Two times a day (BID) | ORAL | Status: AC
  Administered 2012-10-30 – 2012-10-31 (×3): 20 meq via ORAL
  Filled 2012-10-30 (×2): qty 1

## 2012-10-30 MED ORDER — INSULIN ASPART 100 UNIT/ML ~~LOC~~ SOLN: 2.0000 [IU] | Freq: Three times a day (TID) | SUBCUTANEOUS | Status: DC

## 2012-10-30 MED ORDER — NITROGLYCERIN 2 % TD OINT
1.0000 [in_us] | TOPICAL_OINTMENT | Freq: Once | TRANSDERMAL | Status: AC
  Administered 2012-10-30: 1 [in_us] via TOPICAL
  Filled 2012-10-30: qty 1

## 2012-10-30 MED ORDER — AMLODIPINE BESYLATE 10 MG PO TABS
10.0000 mg | ORAL_TABLET | Freq: Every day | ORAL | Status: DC
  Administered 2012-10-30 – 2012-11-01 (×3): 10 mg via ORAL
  Filled 2012-10-30 (×4): qty 1

## 2012-10-30 MED ORDER — ALLOPURINOL 100 MG PO TABS
100.0000 mg | ORAL_TABLET | Freq: Three times a day (TID) | ORAL | Status: DC
  Administered 2012-10-30 – 2012-11-01 (×7): 100 mg via ORAL
  Filled 2012-10-30 (×9): qty 1

## 2012-10-30 MED ORDER — HEPARIN SODIUM (PORCINE) 5000 UNIT/ML IJ SOLN
5000.0000 [IU] | Freq: Three times a day (TID) | INTRAMUSCULAR | Status: DC
  Administered 2012-10-30 – 2012-11-08 (×25): 5000 [IU] via SUBCUTANEOUS
  Filled 2012-10-30 (×31): qty 1

## 2012-10-30 MED ORDER — OXYCODONE HCL 5 MG PO TABS: 5.0000 mg | ORAL_TABLET | ORAL | Status: DC | PRN

## 2012-10-30 MED ORDER — METOPROLOL TARTRATE 100 MG PO TABS
100.0000 mg | ORAL_TABLET | Freq: Two times a day (BID) | ORAL | Status: DC
  Administered 2012-10-30 – 2012-11-01 (×4): 100 mg via ORAL
  Filled 2012-10-30 (×8): qty 1

## 2012-10-30 MED ORDER — CALCIUM CARBONATE-VITAMIN D 500-200 MG-UNIT PO TABS
1.0000 | ORAL_TABLET | Freq: Two times a day (BID) | ORAL | Status: DC
  Administered 2012-10-30 – 2012-11-08 (×17): 1 via ORAL
  Filled 2012-10-30 (×20): qty 1

## 2012-10-30 MED ORDER — FUROSEMIDE 10 MG/ML IJ SOLN
100.0000 mg | Freq: Four times a day (QID) | INTRAVENOUS | Status: DC
  Administered 2012-10-30 – 2012-10-31 (×4): 100 mg via INTRAVENOUS
  Filled 2012-10-30 (×5): qty 10

## 2012-10-30 MED ORDER — INSULIN ASPART 100 UNIT/ML ~~LOC~~ SOLN
0.0000 [IU] | Freq: Three times a day (TID) | SUBCUTANEOUS | Status: DC
  Administered 2012-10-30 (×2): 2 [IU] via SUBCUTANEOUS
  Administered 2012-10-31: 3 [IU] via SUBCUTANEOUS
  Administered 2012-10-31: 5 [IU] via SUBCUTANEOUS
  Administered 2012-11-01: 2 [IU] via SUBCUTANEOUS
  Administered 2012-11-03: 1 [IU] via SUBCUTANEOUS
  Administered 2012-11-03: 5 [IU] via SUBCUTANEOUS
  Administered 2012-11-03: 3 [IU] via SUBCUTANEOUS
  Administered 2012-11-04 (×2): 2 [IU] via SUBCUTANEOUS
  Administered 2012-11-05: 5 [IU] via SUBCUTANEOUS
  Administered 2012-11-05: 3 [IU] via SUBCUTANEOUS
  Administered 2012-11-05 – 2012-11-06 (×2): 7 [IU] via SUBCUTANEOUS
  Administered 2012-11-06: 3 [IU] via SUBCUTANEOUS
  Administered 2012-11-06: 7 [IU] via SUBCUTANEOUS
  Administered 2012-11-07: 2 [IU] via SUBCUTANEOUS
  Administered 2012-11-07: 3 [IU] via SUBCUTANEOUS
  Administered 2012-11-08 (×2): 2 [IU] via SUBCUTANEOUS

## 2012-10-30 MED ORDER — LEVOTHYROXINE SODIUM 112 MCG PO TABS
112.0000 ug | ORAL_TABLET | Freq: Every day | ORAL | Status: DC
  Administered 2012-10-30 – 2012-11-08 (×8): 112 ug via ORAL
  Filled 2012-10-30 (×10): qty 1

## 2012-10-30 MED ORDER — FUROSEMIDE 10 MG/ML IJ SOLN: 40.0000 mg | Freq: Every day | INTRAMUSCULAR | Status: DC

## 2012-10-30 MED ORDER — INSULIN ASPART 100 UNIT/ML ~~LOC~~ SOLN: 0.0000 [IU] | Freq: Every day | SUBCUTANEOUS | Status: DC

## 2012-10-30 MED ORDER — CRANBERRY 300 MG PO TABS
300.0000 mg | ORAL_TABLET | Freq: Two times a day (BID) | ORAL | Status: DC
Start: 2012-10-30 — End: 2012-10-30

## 2012-10-30 MED ORDER — FUROSEMIDE 10 MG/ML IJ SOLN
80.0000 mg | Freq: Once | INTRAMUSCULAR | Status: AC
  Administered 2012-10-30: 80 mg via INTRAVENOUS
  Filled 2012-10-30: qty 8

## 2012-10-30 NOTE — Progress Notes (Signed)
ANTIBIOTIC CONSULT NOTE - FOLLOW UP  Pharmacy Consult for vancomycin/antibiotic renal adjustment Indication: rule out pneumonia  Allergies  Allergen Reactions  . Flu Virus Vaccine     Gets the flu  . Lipitor (Atorvastatin)     Leg cramps    Patient Measurements: Height: 5\' 10"  (177.8 cm) Weight: 241 lb 6.5 oz (109.5 kg) IBW/kg (Calculated) : 73  Vital Signs: Temp: 97.3 F (36.3 C) (05/26 0554) Temp src: Oral (05/26 0554) BP: 125/55 mmHg (05/26 0940) Pulse Rate: 73 (05/26 0940) Intake/Output from previous day:   Intake/Output from this shift: Total I/O In: 243 [P.O.:240; I.V.:3] Out: -   Labs:  Recent Labs  10/30/12 0015 10/30/12 0800  WBC 7.8 7.6  HGB 10.1* 9.2*  PLT 386 351  CREATININE 4.53* 4.53*   Estimated Creatinine Clearance: 19.1 ml/min (by C-G formula based on Cr of 4.53). No results found for this basename: VANCOTROUGH, Leodis Binet, VANCORANDOM, GENTTROUGH, GENTPEAK, GENTRANDOM, TOBRATROUGH, TOBRAPEAK, TOBRARND, AMIKACINPEAK, AMIKACINTROU, AMIKACIN,  in the last 72 hours   Microbiology: Recent Results (from the past 720 hour(s))  MRSA PCR SCREENING     Status: None   Collection Time    10/04/12  9:47 PM      Result Value Range Status   MRSA by PCR NEGATIVE  NEGATIVE Final   Comment:            The GeneXpert MRSA Assay (FDA     approved for NASAL specimens     only), is one component of a     comprehensive MRSA colonization     surveillance program. It is not     intended to diagnose MRSA     infection nor to guide or     monitor treatment for     MRSA infections.  CULTURE, BLOOD (ROUTINE X 2)     Status: None   Collection Time    10/04/12 11:00 PM      Result Value Range Status   Specimen Description BLOOD LEFT ARM   Final   Special Requests BOTTLES DRAWN AEROBIC AND ANAEROBIC 5CC   Final   Culture  Setup Time 10/05/2012 05:02   Final   Culture NO GROWTH 5 DAYS   Final   Report Status 10/11/2012 FINAL   Final  CULTURE, BLOOD (ROUTINE X 2)      Status: None   Collection Time    10/04/12 11:00 PM      Result Value Range Status   Specimen Description BLOOD RIGHT ARM   Final   Special Requests BOTTLES DRAWN AEROBIC AND ANAEROBIC 3CC   Final   Culture  Setup Time 10/05/2012 05:02   Final   Culture NO GROWTH 5 DAYS   Final   Report Status 10/11/2012 FINAL   Final    Anti-infectives   Start     Dose/Rate Route Frequency Ordered Stop   10/31/12 1030  ceFEPIme (MAXIPIME) 1 g in dextrose 5 % 50 mL IVPB     1 g 100 mL/hr over 30 Minutes Intravenous Every 24 hours 10/30/12 1231     10/30/12 1000  ceFEPIme (MAXIPIME) 1 g in dextrose 5 % 50 mL IVPB  Status:  Discontinued     1 g 100 mL/hr over 30 Minutes Intravenous Every 12 hours 10/30/12 0555 10/30/12 1231   10/30/12 0400  ceFEPIme (MAXIPIME) 1 g in dextrose 5 % 50 mL IVPB     1 g 100 mL/hr over 30 Minutes Intravenous  Once 10/30/12 0345 10/30/12 0650  10/30/12 0400  vancomycin (VANCOCIN) 2,500 mg in sodium chloride 0.9 % 500 mL IVPB     2,500 mg 250 mL/hr over 120 Minutes Intravenous  Once 10/30/12 0349 10/30/12 0607      Assessment: 71 y/oM with ?HCAP on empiric antibiotics.  WBC wnl, afebrile.  SCr 4.53, Normalized CrCl 15 ml/min. Pt does not want HD.    Goal of Therapy:  Vancomycin trough level 15-20 mcg/ml  Plan:  Change cefepime to 1g q24h  F/u vanc doses - if not going to be on HD, will need scheduled vanc doses (Vanc load dose was 2500 mg on 0400 on 5/26)   Doris Cheadle, PharmD Clinical Pharmacist Pager: [phone] Phone: [phone] 10/30/2012 12:34 PM

## 2012-10-30 NOTE — ED Provider Notes (Signed)
History     CSN: [id number]  Arrival date & time 10/30/12  0002   First MD Initiated Contact with Patient 10/30/12 0209      Chief Complaint  Patient presents with  . Shortness of Breath   patient was not in the room at 02:20, he was seen at 02:30  (Consider location/radiation/quality/duration/timing/severity/associated sxs/prior treatment) HPI Patient reports for the past couple days he's been getting progressively more short of breath. He states laying down makes his breathing worse. He also describes dyspnea on exertion. He has had a dry cough without fever. His wife relates May 16 they increased his Lasix from 10 mg twice a day to 20 mg twice a day without improvement. He denies chest pain or fever, he does state he is having swelling in his legs. He states he has had congestive heart failure in the past and this feels similar.  Patient reports he had a fistula placed in his left arm about 2 weeks ago for his chronic renal failure.  PCP Dr Leanord Hawking Nephrologist Dr Derenda Mis  Past Medical History  Diagnosis Date  . Hypertension   . Diabetes mellitus   . Chronic kidney disease     was on dialysis in July 2011  . Hypothyroidism   . CHF (congestive heart failure)   . Anemia   . Blind left eye   . Sleep apnea     not on CPAP  . Coronary artery disease   . Arthritis   . Sarcoidosis     Hx:of  . Diabetic retinopathy     Hx: of bilateral  . Edema   . Hyperlipidemia   . 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 Atherosclerosis of native arteries of the extremities, unspecified   . Eczema     Hx: of    Past Surgical History  Procedure Laterality Date  . Cardiac surgery      total of 6 surgeries, 5 related to mrsa  . Coronary artery bypass graft  06/2009  . Cataract surgery      Hx: of  . Debridements      Hx: of secondary to MRSA  . Av fistula placement Right 10/11/2012    Procedure: ARTERIOVENOUS (AV) FISTULA CREATION;  Surgeon: Larina Earthly,  MD;  Location: [facility] OR;  Service: Vascular;  Laterality: Right;    Family History  Problem Relation Age of Onset  . COPD Mother   . COPD Father     History  Substance Use Topics  . Smoking status: Never Smoker   . Smokeless tobacco: Never Used  . Alcohol Use: No   Lives at home Lives with spouse   Review of Systems  All other systems reviewed and are negative.    Allergies  Flu virus vaccine and Lipitor  Home Medications   Current Outpatient Rx  Name  Route  Sig  Dispense  Refill  . allopurinol (ZYLOPRIM) 100 MG tablet   Oral   Take 100 mg by mouth 3 (three) times daily.          Marland Kitchen amLODipine (NORVASC) 10 MG tablet   Oral   Take 10 mg by mouth daily.         Marland Kitchen aspirin 325 MG tablet   Oral   Take 325 mg by mouth daily.         . Calcium Carbonate-Vitamin D (CALCIUM + D PO)   Oral   Take 1 tablet by mouth 2 (two) times daily.         Marland Kitchen  Cranberry 300 MG tablet   Oral   Take 300 mg by mouth 2 (two) times daily.         . ferrous sulfate 325 (65 FE) MG tablet   Oral   Take 325 mg by mouth daily before supper.          . furosemide (LASIX) 40 MG tablet   Oral   Take 80 mg by mouth 2 (two) times daily.          . insulin aspart (NOVOLOG) 100 UNIT/ML injection   Subcutaneous   Inject 2-8 Units into the skin 3 (three) times daily with meals. Take none if blood sugar < 150, 151-200 2 units, 201-250 4 units, 251-300 6 units, 301-350 8 units         . insulin glargine (LANTUS) 100 UNIT/ML injection   Subcutaneous   Inject 0.3 mLs (30 Units total) into the skin every morning.   10 mL   3   . levothyroxine (SYNTHROID, LEVOTHROID) 112 MCG tablet   Oral   Take 112 mcg by mouth daily.         . magnesium oxide (MAG-OX) 400 MG tablet   Oral   Take 400 mg by mouth daily.         . metoprolol (LOPRESSOR) 100 MG tablet   Oral   Take 100 mg by mouth 2 (two) times daily.         Marland Kitchen oxyCODONE (ROXICODONE) 5 MG immediate release tablet    Oral   Take 1 tablet (5 mg total) by mouth every 4 (four) hours as needed for pain.   20 tablet   0   . predniSONE (DELTASONE) 1 MG tablet   Oral   Take 1 mg by mouth 2 (two) times daily.           BP 125/60  Pulse 66  Temp(Src) 98.2 F (36.8 C) (Oral)  Resp 18  Ht 5\' 10"  (1.778 m)  Wt 245 lb (111.131 kg)  BMI 35.15 kg/m2  SpO2 93%  Vital signs normal    Physical Exam  Nursing note and vitals reviewed. Constitutional: He is oriented to person, place, and time. He appears well-developed and well-nourished.  Non-toxic appearance. He does not appear ill. No distress.  HENT:  Head: Normocephalic and atraumatic.  Right Ear: External ear normal.  Left Ear: External ear normal.  Nose: Nose normal. No mucosal edema or rhinorrhea.  Mouth/Throat: Oropharynx is clear and moist and mucous membranes are normal. No dental abscesses or edematous.  Eyes: Conjunctivae and EOM are normal. Pupils are equal, round, and reactive to light.  Neck: Normal range of motion and full passive range of motion without pain. Neck supple.  Cardiovascular: Normal rate, regular rhythm and normal heart sounds.  Exam reveals no gallop and no friction rub.   No murmur heard. Pulmonary/Chest: Effort normal. No respiratory distress. He has no wheezes. He has no rhonchi. He has no rales. He exhibits no tenderness and no crepitus.  Patient has diffuse diminished breath sounds  Abdominal: Soft. Normal appearance and bowel sounds are normal. He exhibits no distension. There is no tenderness. There is no rebound and no guarding.  Musculoskeletal: Normal range of motion. He exhibits edema. He exhibits no tenderness.  Moves all extremities well. Edema of ankles  Neurological: He is alert and oriented to person, place, and time. He has normal strength. No cranial nerve deficit.  Skin: Skin is warm, dry and intact. No rash noted. No  erythema. There is pallor.  Psychiatric: He has a normal mood and affect. His speech is  normal and behavior is normal. His mood appears not anxious.    ED Course  Procedures (including critical care time)  Medications  ceFEPIme (MAXIPIME) 1 g in dextrose 5 % 50 mL IVPB (not administered)  vancomycin (VANCOCIN) 2,500 mg in sodium chloride 0.9 % 500 mL IVPB (2,500 mg Intravenous New Bag/Given 10/30/12 0407)  furosemide (LASIX) injection 80 mg (80 mg Intravenous Given 10/30/12 0327)  nitroGLYCERIN (NITROGLYN) 2 % ointment 1 inch (1 inch Topical Given 10/30/12 0327)   Pt ambulated by nursing staff and his pulse ox dropped to 85-88% on RA and he became more dyspneic.  Pt admitted to the hospital from 4/30-5/4 and he was admitted on 5/4 for fistual placement in his left arm. He was started on health care associated antibiotics for his possible pneumonia. He was also started on increased dose of Lasix for his congestive heart failure.  04:52 Dr Conley Rolls, admit to tele, Team 10  Results for orders placed during the hospital encounter of 10/30/12  CBC      Result Value Range   WBC 7.8  4.0 - 10.5 K/uL   RBC 3.35 (*) 4.22 - 5.81 MIL/uL   Hemoglobin 10.1 (*) 13.0 - 17.0 g/dL   HCT 96.0 (*) 45.4 - 09.8 %   MCV 90.7  78.0 - 100.0 fL   MCH 30.1  26.0 - 34.0 pg   MCHC 33.2  30.0 - 36.0 g/dL   RDW 11.9  14.7 - 82.9 %   Platelets 386  150 - 400 K/uL  BASIC METABOLIC PANEL      Result Value Range   Sodium 137  135 - 145 mEq/L   Potassium 4.2  3.5 - 5.1 mEq/L   Chloride 96  96 - 112 mEq/L   CO2 26  19 - 32 mEq/L   Glucose, Bld 118 (*) 70 - 99 mg/dL   BUN 83 (*) 6 - 23 mg/dL   Creatinine, Ser 5.62 (*) 0.50 - 1.35 mg/dL   Calcium 9.4  8.4 - 13.0 mg/dL   GFR calc non Af Amer 12 (*) >90 mL/min   GFR calc Af Amer 14 (*) >90 mL/min  PRO B NATRIURETIC PEPTIDE      Result Value Range   Pro B Natriuretic peptide (BNP) 17301.0 (*) 0 - 125 pg/mL   Laboratory interpretation all normal except very elevated BNP with no prior to compare to, stable chronic renal failure, stable anemia   Dg Chest 2  View  10/30/2012   *RADIOLOGY REPORT*  Clinical Data: Shortness of breath.  CHEST - 2 VIEW  Comparison: Chest radiograph performed 10/04/2012  Findings: The lungs are well-aerated.  Patchy bilateral airspace opacities are concerning for multifocal pneumonia.  A small left pleural effusion is seen.  No pneumothorax is identified.  The appearance is less typical for pulmonary edema.  The heart is mildly enlarged.  Calcification is noted in the aortic arch.  The patient is status post median sternotomy.  No acute osseous abnormalities are seen.  IMPRESSION: Patchy bilateral airspace opacities are concerning for multifocal pneumonia.  Small left pleural effusion noted.  The appearance is less typical for pulmonary edema.   Original Report Authenticated By: Tonia Ghent, M.D.   US Renal  10/05/2012   IMPRESSION: Normal renal lengths but there is evidence of chronic medical renal disease with cortical thinning bilaterally and increased echogenicity of renal parenchyma with decreased differentiation  between cortex and medulla.  There is no evidence of hydronephrosis involving either kidney.  Small hypoechoic mass involving upper pole of the left kidney is hypoechoic with a few internal echoes most likely reflecting small cyst with some internal debris.  No internal solid nodular area was evident.   Original Report Authenticated By: Onalee Hua Call   Dg Chest Portable 1 View  10/04/2012     IMPRESSION: Cardiomegaly without failure.   Original Report Authenticated By: Andreas Newport, M.D.       Date: 10/30/2012  Rate: 65  Rhythm: normal sinus rhythm  QRS Axis: right  Intervals: PR prolonged  ST/T Wave abnormalities: normal  Conduction Disutrbances:right bundle branch block  Narrative Interpretation:   Old EKG Reviewed: none available    1. CHF (congestive heart failure)   2. Healthcare-associated pneumonia   3. Hypoxia   4. Dyspnea on exertion   5. Anemia   6. Chronic renal insufficiency     Plan  admission  Devoria Albe, MD, FACEP   MDM            Ward Givens, MD 10/30/12 [phone]

## 2012-10-30 NOTE — Consult Note (Addendum)
[facility]        RENAL CONSULT   Reason for Consult: Abnormal renal function in patient with CHF Referring Physician: Dr. Vassie Loll  HPI: Jack Chung is a 72 y.o. white man first seen by me on 05 Oct 2012. At that time he had CR 4.6 and weight was 110 KG. He had been admitted at that time because of hypoglycemia. He had AV fistula placed in right upper arm on 10/11/2012. He has been seen in our office by Dr. Arrie Aran.  He's been the treatment options class and thinks he may want to do peritoneal dialysis when he reaches ESRD. For the last several days he has had orthopnea and DOE. He has been sleeping in a chair. In addition his wife says he has had hypoglycemia down to 40 mg/DL at night.  His weight today is 111.3 KG .  Renal consult was requested by Dr. Gwenlyn Perking .    Past Medical History  Diagnosis Date  . Hypertension   . Diabetes mellitus   . Chronic kidney disease     was on dialysis in July 2011  . Hypothyroidism   . CHF (congestive heart failure)   . Anemia   . Blind left eye   . Sleep apnea     not on CPAP  . Coronary artery disease   . Arthritis   . Sarcoidosis -he has not been treated for sarcoidosis since 1989 according to his wife      Hx:of  . Diabetic retinopathy     Hx: of bilateral  . Edema   . Hyperlipidemia   . 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 Atherosclerosis of native arteries of the extremities, unspecified   . Eczema     Hx: of    Family History  Problem Relation Age of Onset  . COPD-mother died in her 39s  Mother   . COPD-father died age 6   One son with DM, 1 daughter-in unknown health He had 5 siblings-all died from accidents except one who died from tetanus  Father     Social History: Born and grew up in [place], graduated from high school., Lives with his second wife, married 34 yr.  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  No cigarettes, no alcohol, no illegal probes appeared  Allergies:  Allergies  Allergen Reactions  . Flu Virus Vaccine     Gets the flu  . Lipitor (Atorvastatin)     Leg cramps    Medications:  I have reviewed the patient's current medications. Prior to Admission:  Prescriptions prior to admission  Medication Sig Dispense Refill  . allopurinol (ZYLOPRIM) 100 MG tablet Take 100 mg by mouth 3 (three) times daily.       Marland Kitchen amLODipine (NORVASC) 10 MG tablet Take 10 mg by mouth daily.      Marland Kitchen aspirin 325 MG tablet Take 325 mg by mouth daily.      . Calcium Carbonate-Vitamin D (CALCIUM + D PO) Take 1 tablet by mouth 2 (two) times daily.      . Cranberry 300 MG tablet Take 300 mg by mouth 2 (two) times daily.      . ferrous sulfate 325 (65 FE) MG tablet Take 325 mg by mouth daily before supper.       Marland Kitchen  furosemide (LASIX) 40 MG tablet Take 80 mg by mouth 2 (two) times daily.       . insulin aspart (NOVOLOG) 100 UNIT/ML injection Inject 2-8 Units into the skin 3 (three) times daily with meals. Take none if blood sugar < 150, 151-200 2 units, 201-250 4 units, 251-300 6 units, 301-350 8 units      . insulin glargine (LANTUS) 100 UNIT/ML injection Inject 0.3 mLs (30 Units total) into the skin every morning.  10 mL  3  . levothyroxine (SYNTHROID, LEVOTHROID) 112 MCG tablet Take 112 mcg by mouth daily.      . magnesium oxide (MAG-OX) 400 MG tablet Take 400 mg by mouth daily.      . metoprolol (LOPRESSOR) 100 MG tablet Take 100 mg by mouth 2 (two) times daily.      Marland Kitchen oxyCODONE (ROXICODONE) 5 MG immediate release tablet Take 1 tablet (5 mg total) by mouth every 4 (four) hours as needed for pain.  20 tablet  0  . predniSONE (DELTASONE) 1 MG tablet Take 1 mg by mouth 2 (two) times daily.       Scheduled: . allopurinol  100 mg Oral TID  . amLODipine  10 mg Oral Daily  . aspirin  325 mg Oral Daily  . calcium-vitamin D  1 tablet  Oral BID  . [START ON 10/31/2012] ceFEPime (MAXIPIME) IV  1 g Intravenous Q24H  . ferrous sulfate  325 mg Oral QAC supper  . furosemide  100 mg Intravenous Q6H  . heparin  5,000 Units Subcutaneous Q8H  . insulin aspart  0-5 Units Subcutaneous QHS  . insulin aspart  0-9 Units Subcutaneous TID WC  . insulin glargine  30 Units Subcutaneous q morning - 10a  . levothyroxine  112 mcg Oral Daily  . magnesium oxide  400 mg Oral Daily  . metoprolol  100 mg Oral BID  . potassium chloride SA      . potassium chloride  20 mEq Oral BID  . predniSONE  40 mg Oral QAC breakfast  . sodium chloride  3 mL Intravenous Q12H    ROS- no angina, no claudication, no melena, no hematochezia, no gross hematuria, no renal colic, no decreased force of urinary stream, no nocturia,  +doe, + orthopnea, no purulent sputum, no hemoptysis, no weight gain or weight loss, no cold or heat intolerance. He has noted hypoglycemia at night down to 40's  Physical Exam Blood pressure 116/54, pulse 63, temperature 97.3 F (36.3 C), temperature source Oral, resp. rate 18, height 5\' 10"  (1.778 m), weight 109.5 kg (241 lb 6.5 oz), SpO2 95.00%. General-awake, alert, wearing 02 Chest-crackles in bases Heart-no rub Abd-nontender Extr-AVF patent R upper arm, trace edema  Lab- Hemoglobin 9.2, WBC 7600, platelets 351K Sodium 137, potassium 4.2, chloride 96, CO2 26, BUN 83, CR 4.53, glucose 118 Calcium 9. PTH was 207 on 05 Oct 2012 SPEP (05 Oct 2012)-no M spike,  UPEP was ordered but not done earlier this month Urine protein/creatinine ratio was ordered but not done earlier this month  Renal ultrasound done 05 Oct 2012 showed right kidney 10.1 CM,  left kidney 10.9 CM, no hydronephrosis   Assessment/Plan:  1. DM with A. Retinopathy (blind in left eye)                      B. glucose intolerance/recent hypoglycemia--follow CBGs, decrease prednisone back to 1 mg twice a day  C.  probable nephropathy-check urine  protein/creatinine ratio 2.  High BP--continue current meds 3. 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  He is interested in peritoneal dialysis. Will check         coags.   No plans to place PD catheter until he is  breathing better 4. CHF-- increase furosemide to 100 IV every 6 hr.  2-D echo done 10/05/2012 showed EF 55-60% and no WMA 5.  Past history sarcoidosis--wife says he has not been treated for this [phone].  I don't think he needs to be on 40 mg       prednisone at this time 6. Gout--currently on allopurinol 300/D. and was on prednisone MG twice a day PTA.  Will check  uric acid 7.  Anemia--check iron stores, begin ESA once we know iron stores are replete 8.  Hypothyroidism--continue with replacement. Sarabelle Genson F 10/30/2012, 2:59 PM

## 2012-10-30 NOTE — Progress Notes (Signed)
Patient seen and examined. Admitted after midnight secondary to SOB and orthopnea; found in ED with CXR suggesting PNA and vascular congestion. Patient also with worsening renal failure. Please see H&P by Dr. Conley Rolls for further info and assessment/plan on admission.  Plan: -renal consult -maximize IV lasix -IV antibiotics -might need HD -oxygen supplementation. -further plans and treatment depending how patient evolves  [facility] [phone]

## 2012-10-30 NOTE — H&P (Signed)
[facility] History and Physical  JOSEFF Chung GNF:[id number] DOB: Aug 20, 1940    PCP:   [name], MD   Chief Complaint: shortness of breath and orthopnea.  HPI: Jack Chung is an 72 y.o. male with hx of CKD, CHF, sleep apnea, sarcoidosis on chronic steroid, prior dialysis, recent AVF placed on right forearm, presents to the ER with 3 days hx of increase shortness of breath, orthopnea, yellow sputum productive cough, but no chest pain.  Evaluation in the ER included a CXR which showed ? Multifocal PNA, no vascular congestion, a BNP of 17,000, BUN 83, Cr 4.5.  He has no leukocytosis, and Hb is 10 g/DL.  He was given IV Lasix of 80mg , and started on Van/Cefepime.  Hospitalist was asked to admit him for HCAP and volume overload.  Rewiew of Systems:  Constitutional: Negative for malaise, fever and chills. No significant weight loss or weight gain Eyes: Negative for eye pain, redness and discharge, diplopia, visual changes, or flashes of light. ENMT: Negative for ear pain, hoarseness, nasal congestion, sinus pressure and sore throat. No headaches; tinnitus, drooling, or problem swallowing. Cardiovascular: Negative for chest pain, palpitations, diaphoresis, dyspnea  Respiratory: Negative for hemoptysis, wheezing and stridor. No pleuritic chestpain. Gastrointestinal: Negative for nausea, vomiting, diarrhea, constipation, abdominal pain, melena, blood in stool, hematemesis, jaundice and rectal bleeding.    Genitourinary: Negative for frequency, dysuria, incontinence,flank pain and hematuria; Musculoskeletal: Negative for back pain and neck pain. Negative for swelling and trauma.;  Skin: . Negative for pruritus, rash, abrasions, bruising and skin lesion.; ulcerations Neuro: Negative for headache, lightheadedness and neck stiffness. Negative for weakness, altered level of consciousness , altered mental status, extremity weakness, burning feet, involuntary movement, seizure and  syncope.  Psych: negative for anxiety, depression, insomnia, tearfulness, panic attacks, hallucinations, paranoia, suicidal or homicidal ideation    Past Medical History  Diagnosis Date  . Hypertension   . Diabetes mellitus   . Chronic kidney disease     was on dialysis in July 2011  . Hypothyroidism   . CHF (congestive heart failure)   . Anemia   . Blind left eye   . Sleep apnea     not on CPAP  . Coronary artery disease   . Arthritis   . Sarcoidosis     Hx:of  . Diabetic retinopathy     Hx: of bilateral  . Edema   . Hyperlipidemia   . 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 Atherosclerosis of native arteries of the extremities, unspecified   . Eczema     Hx: of    Past Surgical History  Procedure Laterality Date  . Cardiac surgery      total of 6 surgeries, 5 related to mrsa  . Coronary artery bypass graft  06/2009  . Cataract surgery      Hx: of  . Debridements      Hx: of secondary to MRSA  . Av fistula placement Right 10/11/2012    Procedure: ARTERIOVENOUS (AV) FISTULA CREATION;  Surgeon: Larina Earthly, MD;  Location: [facility] OR;  Service: Vascular;  Laterality: Right;    Medications:  HOME MEDS: Prior to Admission medications   Medication Sig Start Date End Date Taking? Authorizing Provider  allopurinol (ZYLOPRIM) 100 MG tablet Take 100 mg by mouth 3 (three) times daily.    Yes Historical Provider, MD  amLODipine (NORVASC) 10 MG tablet Take 10 mg by mouth daily.   Yes Historical Provider, MD  aspirin  325 MG tablet Take 325 mg by mouth daily.   Yes Historical Provider, MD  Calcium Carbonate-Vitamin D (CALCIUM + D PO) Take 1 tablet by mouth 2 (two) times daily.   Yes Historical Provider, MD  Cranberry 300 MG tablet Take 300 mg by mouth 2 (two) times daily.   Yes Historical Provider, MD  ferrous sulfate 325 (65 FE) MG tablet Take 325 mg by mouth daily before supper.    Yes Historical Provider, MD  furosemide (LASIX) 40 MG tablet Take 80 mg by  mouth 2 (two) times daily.    Yes Historical Provider, MD  insulin aspart (NOVOLOG) 100 UNIT/ML injection Inject 2-8 Units into the skin 3 (three) times daily with meals. Take none if blood sugar < 150, 151-200 2 units, 201-250 4 units, 251-300 6 units, 301-350 8 units   Yes Historical Provider, MD  insulin glargine (LANTUS) 100 UNIT/ML injection Inject 0.3 mLs (30 Units total) into the skin every morning. 10/27/12  Yes Tiffany L Reed, DO  levothyroxine (SYNTHROID, LEVOTHROID) 112 MCG tablet Take 112 mcg by mouth daily.   Yes Historical Provider, MD  magnesium oxide (MAG-OX) 400 MG tablet Take 400 mg by mouth daily.   Yes Historical Provider, MD  metoprolol (LOPRESSOR) 100 MG tablet Take 100 mg by mouth 2 (two) times daily. 10/08/12  Yes Richarda Overlie, MD  oxyCODONE (ROXICODONE) 5 MG immediate release tablet Take 1 tablet (5 mg total) by mouth every 4 (four) hours as needed for pain. 10/11/12  Yes Regina J Roczniak, PA-C  predniSONE (DELTASONE) 1 MG tablet Take 1 mg by mouth 2 (two) times daily.   Yes Historical Provider, MD     Allergies:  Allergies  Allergen Reactions  . Flu Virus Vaccine     Gets the flu  . Lipitor (Atorvastatin)     Leg cramps    Social History:   reports that he has never smoked. He has never used smokeless tobacco. He reports that he does not drink alcohol or use illicit drugs.  Family History: Family History  Problem Relation Age of Onset  . COPD Mother   . COPD Father      Physical Exam: Filed Vitals:   10/30/12 0240 10/30/12 0325 10/30/12 0448 10/30/12 0554  BP:  124/55 125/60 130/54  Pulse:  65 66 71  Temp:    97.3 F (36.3 C)  TempSrc:    Oral  Resp:  18  18  Height: 5\' 10"  (1.778 m)   5\' 10"  (1.778 m)  Weight: 111.131 kg (245 lb)   109.5 kg (241 lb 6.5 oz)  SpO2:  95% 93% 95%   Blood pressure 130/54, pulse 71, temperature 97.3 F (36.3 C), temperature source Oral, resp. rate 18, height 5\' 10"  (1.778 m), weight 109.5 kg (241 lb 6.5 oz), SpO2  95.00%.  GEN:  Pleasant  patient lying in the stretcher in no acute distress; cooperative with exam. PSYCH:  alert and oriented x4; does not appear anxious or depressed; affect is appropriate. HEENT: Mucous membranes pink and anicteric; PERRLA; EOM intact; no cervical lymphadenopathy nor thyromegaly or carotid bruit; no JVD; There were no stridor. Neck is very supple. Breasts:: Not examined CHEST WALL: No tenderness CHEST: Normal respiration, some wheezing, bilateral crackles. HEART: Regular rate and rhythm.  There are no murmur, rub, or gallops.   BACK: No kyphosis or scoliosis; no CVA tenderness ABDOMEN: soft and non-tender; no masses, no organomegaly, normal abdominal bowel sounds; no pannus; no intertriginous candida. There is no  rebound and no distention. Rectal Exam: Not done EXTREMITIES: No bone or joint deformity; age-appropriate arthropathy of the hands and knees; trace edema; no ulcerations.  There is no calf tenderness. AVF with good thrills Genitalia: not examined PULSES: 2+ and symmetric SKIN: Normal hydration no rash or ulceration CNS: Cranial nerves 2-12 grossly intact no focal lateralizing neurologic deficit.  Speech is fluent; uvula elevated with phonation, facial symmetry and tongue midline. DTR are normal bilaterally, cerebella exam is intact, barbinski is negative and strengths are equaled bilaterally.  No sensory loss.   Labs on Admission:  Basic Metabolic Panel:  Recent Labs Lab 10/30/12 0015  NA 137  K 4.2  CL 96  CO2 26  GLUCOSE 118*  BUN 83*  CREATININE 4.53*  CALCIUM 9.4   Liver Function Tests: No results found for this basename: AST, ALT, ALKPHOS, BILITOT, PROT, ALBUMIN,  in the last 168 hours No results found for this basename: LIPASE, AMYLASE,  in the last 168 hours No results found for this basename: AMMONIA,  in the last 168 hours CBC:  Recent Labs Lab 10/30/12 0015  WBC 7.8  HGB 10.1*  HCT 30.4*  MCV 90.7  PLT 386   Cardiac Enzymes: No  results found for this basename: CKTOTAL, CKMB, CKMBINDEX, TROPONINI,  in the last 168 hours  CBG: No results found for this basename: GLUCAP,  in the last 168 hours   Radiological Exams on Admission: Dg Chest 2 View  10/30/2012   *RADIOLOGY REPORT*  Clinical Data: Shortness of breath.  CHEST - 2 VIEW  Comparison: Chest radiograph performed 10/04/2012  Findings: The lungs are well-aerated.  Patchy bilateral airspace opacities are concerning for multifocal pneumonia.  A small left pleural effusion is seen.  No pneumothorax is identified.  The appearance is less typical for pulmonary edema.  The heart is mildly enlarged.  Calcification is noted in the aortic arch.  The patient is status post median sternotomy.  No acute osseous abnormalities are seen.  IMPRESSION: Patchy bilateral airspace opacities are concerning for multifocal pneumonia.  Small left pleural effusion noted.  The appearance is less typical for pulmonary edema.   Original Report Authenticated By: Tonia Ghent, M.D.    Assessment/Plan Present on Admission:  . HCAP (healthcare-associated pneumonia) . CHF (congestive heart failure) . Type II or unspecified type diabetes mellitus with renal manifestations, uncontrolled(250.42) . CKD (chronic kidney disease) . Sarcoidosis . Sleep apnea  PLAN:  Will admit him for HCAP and mild CHF.  Since he has been on chronic steroid (? For sarcoid), I will increase it to 40mg  per day.  Will continue with IV VAN and Cefepime.  For his volume overload, will continue daily lasix IV and obtain an ECHO.  For his DM, will continue with home insulin regimen, add SSI, and place on carb modified diet.  He is stable, full code (confirmed again tonight), and will be admitted to [facility] service under telemetry.  Thank you for allowing me to partake in the care of your nice patient.  Other plans as per orders.  Code Status: FULL Unk Lightning, MD. [facility] Pager [phone] 7pm to  7am.  10/30/2012, 6:33 AM

## 2012-10-30 NOTE — ED Notes (Signed)
Pt c/o SOB when he lays down and with exertion x 3 days. N/V x 3, but not in last 24 hrs. Not eating much. Productive cough.

## 2012-10-30 NOTE — ED Notes (Signed)
Pt ambulated around nurses station o2 stats on room air run 85-88%

## 2012-10-30 NOTE — Progress Notes (Signed)
ANTIBIOTIC CONSULT NOTE - INITIAL  Pharmacy Consult for vancomycin Indication: rule out pneumonia  Allergies  Allergen Reactions  . Flu Virus Vaccine     Gets the flu  . Lipitor (Atorvastatin)     Leg cramps    Patient Measurements: Height: 5\' 10"  (177.8 cm) Weight: 245 lb (111.131 kg) IBW/kg (Calculated) : 73  Vital Signs: Temp: 98.2 F (36.8 C) (05/26 0006) Temp src: Oral (05/26 0006) BP: 124/55 mmHg (05/26 0325) Pulse Rate: 65 (05/26 0325)  Labs:  Recent Labs  10/30/12 0015  WBC 7.8  HGB 10.1*  PLT 386  CREATININE 4.53*   Estimated Creatinine Clearance: 19.2 ml/min (by C-G formula based on Cr of 4.53).   Microbiology: Recent Results (from the past 720 hour(s))  MRSA PCR SCREENING     Status: None   Collection Time    10/04/12  9:47 PM      Result Value Range Status   MRSA by PCR NEGATIVE  NEGATIVE Final   Comment:            The GeneXpert MRSA Assay (FDA     approved for NASAL specimens     only), is one component of a     comprehensive MRSA colonization     surveillance program. It is not     intended to diagnose MRSA     infection nor to guide or     monitor treatment for     MRSA infections.  CULTURE, BLOOD (ROUTINE X 2)     Status: None   Collection Time    10/04/12 11:00 PM      Result Value Range Status   Specimen Description BLOOD LEFT ARM   Final   Special Requests BOTTLES DRAWN AEROBIC AND ANAEROBIC 5CC   Final   Culture  Setup Time 10/05/2012 05:02   Final   Culture NO GROWTH 5 DAYS   Final   Report Status 10/11/2012 FINAL   Final  CULTURE, BLOOD (ROUTINE X 2)     Status: None   Collection Time    10/04/12 11:00 PM      Result Value Range Status   Specimen Description BLOOD RIGHT ARM   Final   Special Requests BOTTLES DRAWN AEROBIC AND ANAEROBIC 3CC   Final   Culture  Setup Time 10/05/2012 05:02   Final   Culture NO GROWTH 5 DAYS   Final   Report Status 10/11/2012 FINAL   Final    Medical History: Past Medical History   Diagnosis Date  . Hypertension   . Diabetes mellitus   . Chronic kidney disease     was on dialysis in July 2011  . Hypothyroidism   . CHF (congestive heart failure)   . Anemia   . Blind left eye   . Sleep apnea     not on CPAP  . Coronary artery disease   . Arthritis   . Sarcoidosis     Hx:of  . Diabetic retinopathy     Hx: of bilateral  . Edema   . Hyperlipidemia   . 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 Atherosclerosis of native arteries of the extremities, unspecified   . Eczema     Hx: of    Assessment: 72yo male had fistula surgery earlier this month, now c/o SOB in supine position and with exertion x3d as well as productive cough, CXR concerning for multifocal PNA, to begin IV ABX.  Of note, pt does not want HD,  has taken class on PD, current plans unclear.  Goal of Therapy:  Vancomycin trough level 15-20 mcg/ml  Plan:  Will give vancomycin 2500mg  IV x1 now and f/u with renal plans prior to continued dosing.  Vernard Gambles, PharmD, BCPS  10/30/2012,3:50 AM

## 2012-10-31 ENCOUNTER — Inpatient Hospital Stay (HOSPITAL_COMMUNITY): Payer: Medicare Other

## 2012-10-31 ENCOUNTER — Ambulatory Visit: Payer: Medicare Other | Admitting: Nurse Practitioner

## 2012-10-31 DIAGNOSIS — E1165 Type 2 diabetes mellitus with hyperglycemia: Secondary | ICD-10-CM

## 2012-10-31 DIAGNOSIS — E039 Hypothyroidism, unspecified: Secondary | ICD-10-CM

## 2012-10-31 DIAGNOSIS — D649 Anemia, unspecified: Secondary | ICD-10-CM

## 2012-10-31 DIAGNOSIS — N189 Chronic kidney disease, unspecified: Secondary | ICD-10-CM

## 2012-10-31 DIAGNOSIS — I059 Rheumatic mitral valve disease, unspecified: Secondary | ICD-10-CM

## 2012-10-31 DIAGNOSIS — I509 Heart failure, unspecified: Secondary | ICD-10-CM

## 2012-10-31 LAB — GLUCOSE, CAPILLARY
Glucose-Capillary: 113 mg/dL — ABNORMAL HIGH (ref 70–99)
Glucose-Capillary: 68 mg/dL — ABNORMAL LOW (ref 70–99)

## 2012-10-31 LAB — BASIC METABOLIC PANEL
BUN: 91 mg/dL — ABNORMAL HIGH (ref 6–23)
CO2: 21 mEq/L (ref 19–32)
Calcium: 9 mg/dL (ref 8.4–10.5)
Glucose, Bld: 245 mg/dL — ABNORMAL HIGH (ref 70–99)
Potassium: 4.3 mEq/L (ref 3.5–5.1)
Sodium: 133 mEq/L — ABNORMAL LOW (ref 135–145)

## 2012-10-31 LAB — PARATHYROID HORMONE, INTACT (NO CA): PTH: 81.8 pg/mL — ABNORMAL HIGH (ref 14.0–72.0)

## 2012-10-31 LAB — PROTIME-INR
INR: 1.1 (ref 0.00–1.49)
Prothrombin Time: 14.1 seconds (ref 11.6–15.2)

## 2012-10-31 LAB — APTT: aPTT: 38 seconds — ABNORMAL HIGH (ref 24–37)

## 2012-10-31 LAB — PROTEIN / CREATININE RATIO, URINE
Creatinine, Urine: 49.3 mg/dL
Protein Creatinine Ratio: 0.48 — ABNORMAL HIGH (ref 0.00–0.15)

## 2012-10-31 MED ORDER — DARBEPOETIN ALFA-POLYSORBATE 100 MCG/0.5ML IJ SOLN
100.0000 ug | INTRAMUSCULAR | Status: DC
  Administered 2012-11-01: 100 ug via SUBCUTANEOUS
  Filled 2012-10-31 (×3): qty 0.5

## 2012-10-31 MED ORDER — VANCOMYCIN HCL 10 G IV SOLR: 1500.0000 mg | INTRAVENOUS | Status: DC

## 2012-10-31 MED ORDER — DOXYCYCLINE HYCLATE 100 MG PO TABS
100.0000 mg | ORAL_TABLET | Freq: Two times a day (BID) | ORAL | Status: DC
  Administered 2012-10-31 – 2012-11-04 (×8): 100 mg via ORAL
  Filled 2012-10-31 (×10): qty 1

## 2012-10-31 MED ORDER — FERUMOXYTOL INJECTION 510 MG/17 ML
510.0000 mg | Freq: Once | INTRAVENOUS | Status: AC
  Administered 2012-10-31: 510 mg via INTRAVENOUS
  Filled 2012-10-31: qty 17

## 2012-10-31 MED ORDER — FUROSEMIDE 10 MG/ML IJ SOLN
160.0000 mg | Freq: Four times a day (QID) | INTRAVENOUS | Status: DC
  Administered 2012-10-31 – 2012-11-04 (×14): 160 mg via INTRAVENOUS
  Filled 2012-10-31 (×18): qty 16

## 2012-10-31 NOTE — Progress Notes (Signed)
Patient stated that the itching has calm down a lot. MD is aware and stated to not to give him anything now. Will continue to monitor for further changes in condition.

## 2012-10-31 NOTE — Progress Notes (Signed)
Pt BP 96/50 manual, HR 62. MD notified and ordered to hold PM dose of lopressor PO. Baron Hamper, RN 10/31/2012

## 2012-10-31 NOTE — Progress Notes (Addendum)
[facility] PROGRESS NOTE  Jack Chung [name]:[id number] DOB: 24-Oct-1940 DOA: 10/30/2012 PCP: [name], MD  Assessment/Plan: 1-SOB: with associated non-productive cough and increased swelling. Differential is for CHF worsening given loss ability to urinate vs PNA -patient no-toxic on exam, but complaining of non productive cough. Will stop broad spectrum abx's and use doxycycline for atypical PNA/bronchitis. Will treat for 8 days total of antibiotics . 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 Patient interesting for peritoneal dialysis if kidney function failed to improved. BMET pending today.   3-DM: continue SSI  4-Gout: continue prednisone and allopurinol  5-hypothyroidism: continue synthroid  6-HTN: continue current meds  7-Anemia: continue IV iron and aranesp per renal service    Code Status: Full Family Communication: wife at bedside Disposition Plan: home when medically stable   Consultants:  Renal service  Procedures:  2-D echo pending  Antibiotics:  vanc and cefepime 5/25 -- 5/27  Doxycycline 5/27  HPI/Subjective: Afebrile, no-productive cough; breathing better; urine output slightly increased according to patient. BMET pending.  Objective: Filed Vitals:   10/30/12 2034 10/31/12 0105 10/31/12 0537 10/31/12 1130  BP: 125/56 128/57 117/51 116/54  Pulse: 62  64 63  Temp: 98.2 F (36.8 C)  98.2 F (36.8 C)   TempSrc: Oral  Oral   Resp: 18  20   Height:      Weight:   109.634 kg (241 lb 11.2 oz)   SpO2: 98%  98%     Intake/Output Summary (Last 24 hours) at 10/31/12 1321 Last data filed at 10/31/12 1252  Gross per 24 hour  Intake   1163 ml  Output   1200 ml  Net    -37 ml   Filed Weights   10/30/12 0240 10/30/12 0554 10/31/12 0537  Weight: 111.131 kg (245 lb) 109.5 kg (241 lb 6.5 oz) 109.634 kg (241 lb 11.2 oz)    Exam:   General:  NAD; afebrile; non-productive cough  Cardiovascular: S1 and  S2, no rubs or gallops  Respiratory: no crackles; scattered rhonchi  Abdomen: soft, NT, ND, positive BS  Musculoskeletal: trace edema bilaterally, no cyanosis  Data Reviewed: Basic Metabolic Panel:  Recent Labs Lab 10/30/12 0015 10/30/12 0800  NA 137  --   K 4.2  --   CL 96  --   CO2 26  --   GLUCOSE 118*  --   BUN 83*  --   CREATININE 4.53* 4.53*  CALCIUM 9.4  --    CBC:  Recent Labs Lab 10/30/12 0015 10/30/12 0800  WBC 7.8 7.6  HGB 10.1* 9.2*  HCT 30.4* 28.2*  MCV 90.7 92.5  PLT 386 351   BNP (last 3 results)  Recent Labs  10/30/12 0030  PROBNP 17301.0*   CBG:  Recent Labs Lab 10/30/12 1137 10/30/12 1626 10/30/12 2118 10/31/12 0609 10/31/12 1119  GLUCAP 171* 171* 163* 219* 258*    Studies: Dg Chest 2 View  10/30/2012   *RADIOLOGY REPORT*  Clinical Data: Shortness of breath.  CHEST - 2 VIEW  Comparison: Chest radiograph performed 10/04/2012  Findings: The lungs are well-aerated.  Patchy bilateral airspace opacities are concerning for multifocal pneumonia.  A small left pleural effusion is seen.  No pneumothorax is identified.  The appearance is less typical for pulmonary edema.  The heart is mildly enlarged.  Calcification is noted in the aortic arch.  The patient is status post median sternotomy.  No acute osseous abnormalities are seen.  IMPRESSION: Patchy bilateral airspace opacities are concerning for multifocal pneumonia.  Small left pleural effusion noted.  The appearance is less typical for pulmonary edema.   Original Report Authenticated By: Tonia Ghent, M.D.    Scheduled Meds: . allopurinol  100 mg Oral TID  . amLODipine  10 mg Oral Daily  . aspirin  325 mg Oral Daily  . calcium-vitamin D  1 tablet Oral BID  . ceFEPime (MAXIPIME) IV  1 g Intravenous Q24H  . doxycycline  100 mg Oral Q12H  . ferrous sulfate  325 mg Oral QAC supper  . furosemide  160 mg Intravenous Q6H  . heparin  5,000 Units Subcutaneous Q8H  . insulin aspart  0-9 Units  Subcutaneous TID WC  . insulin glargine  30 Units Subcutaneous q morning - 10a  . levothyroxine  112 mcg Oral Daily  . magnesium oxide  400 mg Oral Daily  . metoprolol  100 mg Oral BID  . predniSONE  1 mg Oral BID WC  . sodium chloride  3 mL Intravenous Q12H   Continuous Infusions:   Principal Problem:   HCAP (healthcare-associated pneumonia) Active Problems:   CKD (chronic kidney disease)   Type II or unspecified type diabetes mellitus with renal manifestations, uncontrolled(250.42)   CHF (congestive heart failure)   Sarcoidosis   Sleep apnea    Time spent: >30 minutes   [name]  [facility] Pager [phone]. If 7PM-7AM, please contact night-coverage at [email or web address], password [facility] 10/31/2012, 1:21 PM  LOS: 1 day

## 2012-10-31 NOTE — Progress Notes (Signed)
Hypoglycemic Event  CBG: 68  Treatment: 15 GM carbohydrate snack  Symptoms: None  Follow-up CBG: Time:2203 CBG Result:106  Possible Reasons for Event: Inadequate meal intake  Comments/MD notified: K Schorr on call notified    Jobe Igo A  Remember to initiate Hypoglycemia Order Set & complete

## 2012-10-31 NOTE — Progress Notes (Signed)
Patient received dose of IV feraheme. Immediately following the given dosage, patient started to complain of itching. VS: temp is 98.2; BP 109/52; HR 63. No signs of distress and no complaints of pain. MD notified. No new orders given. Will continue to monitor patient for further changes in condition.

## 2012-10-31 NOTE — Progress Notes (Signed)
[facility]  Subjective:  Awake, alert, wife at bedside Still coughing when lying flat   Objective: Vital signs in last 24 hours: Blood pressure 116/54, pulse 63, temperature 98.2 F (36.8 C), temperature source Oral, resp. rate 20, height 5\' 10"  (1.778 m), weight 109.634 kg (241 lb 11.2 oz), SpO2 98.00%.    PHYSICAL EXAM General-awake, alert, wearing 02  Chest-crackles in bases  Heart-no rub  Abd-nontender  Extr-AVF patent R upper arm, trace edema  Weight 111.1 on admission--109.6 today I/O last 24 hr  923/900  Lab Results:   Recent Labs Lab 10/30/12 0015 10/30/12 0800  NA 137  --   K 4.2  --   CL 96  --   CO2 26  --   BUN 83*  --   CREATININE 4.53* 4.53*  GLUCOSE 118*  --   CALCIUM 9.4  --      Recent Labs  10/30/12 0015 10/30/12 0800  WBC 7.8 7.6  HGB 10.1* 9.2*  HCT 30.4* 28.2*  PLT 386 351     I have reviewed the patient's current medications. Scheduled: . allopurinol  100 mg Oral TID  . amLODipine  10 mg Oral Daily  . aspirin  325 mg Oral Daily  . calcium-vitamin D  1 tablet Oral BID  . ceFEPime (MAXIPIME) IV  1 g Intravenous Q24H  . doxycycline  100 mg Oral Q12H  . ferrous sulfate  325 mg Oral QAC supper  . furosemide  160 mg Intravenous Q6H  . heparin  5,000 Units Subcutaneous Q8H  . insulin aspart  0-9 Units Subcutaneous TID WC  . insulin glargine  30 Units Subcutaneous q morning - 10a  . levothyroxine  112 mcg Oral Daily  . magnesium oxide  400 mg Oral Daily  . metoprolol  100 mg Oral BID  . predniSONE  1 mg Oral BID WC  . sodium chloride  3 mL Intravenous Q12H   Assessment/Plan: 1.  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 glucose intolerance/recent hypoglycemia--follow CBGs,                                     prednisone is now back to 1 mg twice a day                      C. probable nephropathy- urine protein/creatinine ratio 0.5  2. High BP--continue current meds  3. CKD-4--no IVs or  needlesticks and right upper arm.UPEP pending.  PTH 82 yesterday. He is interested in peritoneal dialysis. PT 14 sec, INR 1.1. No plans to place PD catheter until he is breathing better.  If no improvement with increased diuretics, will have TDC placed for HD, UF to improve ECF vol excess, then place PD cath once euvolemic (can be done as outpatient).   4. CHF-- increase furosemide to 160 IV every 6 hr. 2-D echo done 10/05/2012 showed EF 55-60% and no WMA  5. Past history sarcoidosis--wife says he has not been treated for this [phone]. prednisone at this time  6. Gout--currently on allopurinol 300/D. and was on prednisone 1 MG twice a day PTA. Uric acid 7.9 on 26 May 7. Anemia--hgb 9.2.  Fe/TIBC 14%, ferritin 641.  Will begin Feraheme (IV Fe) and aranesp 8. Hypothyroidism--continue with replacement.  LOS: 1 day   Jack Chung F 10/31/2012,1:18 PM   .labalb

## 2012-11-01 ENCOUNTER — Encounter (HOSPITAL_COMMUNITY): Payer: Self-pay | Admitting: Radiology

## 2012-11-01 ENCOUNTER — Ambulatory Visit: Payer: Medicare Other | Admitting: Physical Therapy

## 2012-11-01 DIAGNOSIS — D869 Sarcoidosis, unspecified: Secondary | ICD-10-CM

## 2012-11-01 LAB — RENAL FUNCTION PANEL
Albumin: 2.5 g/dL — ABNORMAL LOW (ref 3.5–5.2)
Chloride: 95 mEq/L — ABNORMAL LOW (ref 96–112)
GFR calc Af Amer: 12 mL/min — ABNORMAL LOW (ref >90)
Phosphorus: 3.3 mg/dL (ref 2.3–4.6)
Potassium: 4.5 mEq/L (ref 3.5–5.1)
Sodium: 134 mEq/L — ABNORMAL LOW (ref 135–145)

## 2012-11-01 LAB — GLUCOSE, CAPILLARY
Glucose-Capillary: 110 mg/dL — ABNORMAL HIGH (ref 70–99)
Glucose-Capillary: 119 mg/dL — ABNORMAL HIGH (ref 70–99)

## 2012-11-01 LAB — CBC
Platelets: 314 10*3/uL (ref 150–400)
RBC: 2.49 MIL/uL — ABNORMAL LOW (ref 4.22–5.81)
RDW: 14 % (ref 11.5–15.5)
WBC: 14.4 10*3/uL — ABNORMAL HIGH (ref 4.0–10.5)

## 2012-11-01 MED ORDER — METOPROLOL TARTRATE 50 MG PO TABS
50.0000 mg | ORAL_TABLET | Freq: Once | ORAL | Status: AC
  Administered 2012-11-01: 50 mg via ORAL
  Filled 2012-11-01: qty 1

## 2012-11-01 MED ORDER — ALLOPURINOL 100 MG PO TABS
100.0000 mg | ORAL_TABLET | Freq: Two times a day (BID) | ORAL | Status: DC
  Administered 2012-11-01 – 2012-11-08 (×12): 100 mg via ORAL
  Filled 2012-11-01 (×15): qty 1

## 2012-11-01 MED ORDER — CEFAZOLIN SODIUM-DEXTROSE 2-3 GM-% IV SOLR
2.0000 g | INTRAVENOUS | Status: AC
  Administered 2012-11-02: 2 g via INTRAVENOUS
  Filled 2012-11-01: qty 50

## 2012-11-01 MED ORDER — DIPHENHYDRAMINE HCL 25 MG PO CAPS: 50.0000 mg | ORAL_CAPSULE | Freq: Four times a day (QID) | ORAL | Status: DC | PRN

## 2012-11-01 NOTE — Progress Notes (Signed)
Patient's Hgb was 7.6. MD made aware. No new orders given at this time. Will continue to monitor patient for further changes in condition.

## 2012-11-01 NOTE — Progress Notes (Signed)
[facility]  Subjective:  Despite max doses of diuretics, weight continues to climb, edema persists, and Cr is higher today.  He says breathing is a little better. He says transfusion is planned for today.       Objective: Vital signs in last 24 hours: Blood pressure 117/64, pulse 67, temperature 97.7 F (36.5 C), temperature source Oral, resp. rate 18, height 5\' 10"  (1.778 m), weight 109.952 kg (242 lb 6.4 oz), SpO2 94.00%.    PHYSICAL EXAM General-awake, alert, wearing 02  Chest-crackles in bases  Heart-no rub  Abd-nontender  Extr-AVF patent R upper arm, trace edema  I/O last 24 hr 959/1200  Lab Results:   Recent Labs Lab 10/30/12 0015 10/30/12 0800 10/31/12 1300 11/01/12 0450  NA 137  --  133* 134*  K 4.2  --  4.3 4.5  CL 96  --  94* 95*  CO2 26  --  21 27  BUN 83*  --  91* 96*  CREATININE 4.53* 4.53* 4.70* 5.03*  GLUCOSE 118*  --  245* 124*  CALCIUM 9.4  --  9.0 8.7  PHOS  --   --   --  3.3     Recent Labs  10/30/12 0800 11/01/12 0450  WBC 7.6 14.4*  HGB 9.2* 7.6*  HCT 28.2* 22.9*  PLT 351 314     I have reviewed the patient's current medications. Scheduled: . allopurinol  100 mg Oral TID  . amLODipine  10 mg Oral Daily  . aspirin  325 mg Oral Daily  . calcium-vitamin D  1 tablet Oral BID  . ceFEPime (MAXIPIME) IV  1 g Intravenous Q24H  . darbepoetin (ARANESP) injection - NON-DIALYSIS  100 mcg Subcutaneous Q Wed-1800  . doxycycline  100 mg Oral Q12H  . ferrous sulfate  325 mg Oral QAC supper  . furosemide  160 mg Intravenous Q6H  . heparin  5,000 Units Subcutaneous Q8H  . insulin aspart  0-9 Units Subcutaneous TID WC  . insulin glargine  30 Units Subcutaneous q morning - 10a  . levothyroxine  112 mcg Oral Daily  . magnesium oxide  400 mg Oral Daily  . metoprolol  100 mg Oral BID  . predniSONE  1 mg Oral BID WC  . sodium chloride  3 mL Intravenous Q12H    Assessment/Plan: 1. 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 glucose intolerance/recent hypoglycemia--follow CBGs, prednisone is now back to 1 mg twice a day       C. probable nephropathy- urine protein/creatinine ratio 0.5  2. High BP--continue current meds  3. CKD-4--no IVs or needlesticks and right upper arm.UPEP pending. PTH 82. He is interested in peritoneal dialysis. PT 14 sec, INR 1.1. Will  plan to  PD catheter once  breathing better.  Will request TDC placed for HD in AM, then dialyze 4. CHF-- continue furosemide to 160 IV every 6 hr. 2-D echo done 10/05/2012 showed EF 55-60% and no WMA  5. Past history sarcoidosis--wife says he has not been treated for this [phone]. 6. Gout--currently on allopurinol 300/D. and was on prednisone 1 MG twice a day PTA. Uric acid 7.9 on 26 May  7. Anemia--On feraheme (IV Fe) and aranesp.  Hgb 7.6 today  8. Hypothyroidism--continue with replacement.     LOS: 2 days   Lamarco Gudiel F 11/01/2012,2:40 PM   .labalb

## 2012-11-01 NOTE — Progress Notes (Signed)
Chart reviewed.  D/W Dr. Caryn Chung  [facility] PROGRESS NOTE  Jack Chung QMV:[id number] DOB: September 13, 1940 DOA: 10/30/2012 PCP: Jack Sleeper, MD  Assessment/Plan: 1-SOB:due to fluid overload and pneumonia.  Abx changed to PO.  For dialysis tomorrow after HD cath placed.  Cough and dyspnea improved.  Afebrile.  On steroids, so WBC may be unreliable.  EF ok.  Echo shows diastolic dysfunction  2-CKD, now end stage:  Start dialysis tomorrow  3-DM: continue SSI  4-Gout: continue prednisone and change allopurinol to bid  5-hypothyroidism: continue synthroid  6-HTN: continue current meds  7-Anemia: continue IV iron and aranesp per renal service. Monitor hgb.  Transfuse if hgb drops further.  No bleeding reported  Code Status: Full Family Communication: wife at bedside Disposition Plan: home when medically stable   Consultants:  Renal service  Procedures:  2-D echo pending  Antibiotics:  vanc and cefepime 5/25 -- 5/27  Doxycycline 5/27  HPI/Subjective: Cough, breathing improving.  Has not been OOB.  Sad about starting dialysis  Objective: Filed Vitals:   10/31/12 2139 11/01/12 0144 11/01/12 0651 11/01/12 1138  BP: 105/57 105/47 117/49 117/64  Pulse: 82 60 63 67  Temp: 97.9 F (36.6 C) 98 F (36.7 C) 97.7 F (36.5 C)   TempSrc: Oral Oral Oral   Resp: 18 17 18    Height:      Weight:   109.952 kg (242 lb 6.4 oz)   SpO2: 98% 97% 94%     Intake/Output Summary (Last 24 hours) at 11/01/12 1543 Last data filed at 11/01/12 1157  Gross per 24 hour  Intake    429 ml  Output    900 ml  Net   -471 ml   Filed Weights   10/30/12 0554 10/31/12 0537 11/01/12 0651  Weight: 109.5 kg (241 lb 6.5 oz) 109.634 kg (241 lb 11.2 oz) 109.952 kg (242 lb 6.4 oz)    Exam:   General:  NAD; comfortable  Cardiovascular: S1 and S2, no rubs or gallops  Respiratory: CTA without WRR  Abdomen: soft, NT, ND, positive BS  Musculoskeletal: trace edema bilaterally, no  cyanosis  Psych:  Sad, flat affect  Data Reviewed: Basic Metabolic Panel:  Recent Labs Lab 10/30/12 0015 10/30/12 0800 10/31/12 1300 11/01/12 0450  NA 137  --  133* 134*  K 4.2  --  4.3 4.5  CL 96  --  94* 95*  CO2 26  --  21 27  GLUCOSE 118*  --  245* 124*  BUN 83*  --  91* 96*  CREATININE 4.53* 4.53* 4.70* 5.03*  CALCIUM 9.4  --  9.0 8.7  PHOS  --   --   --  3.3   CBC:  Recent Labs Lab 10/30/12 0015 10/30/12 0800 11/01/12 0450  WBC 7.8 7.6 14.4*  HGB 10.1* 9.2* 7.6*  HCT 30.4* 28.2* 22.9*  MCV 90.7 92.5 92.0  PLT 386 351 314   BNP (last 3 results)  Recent Labs  10/30/12 0030  PROBNP 17301.0*   CBG:  Recent Labs Lab 10/31/12 1119 10/31/12 1619 10/31/12 2135 10/31/12 2203 11/01/12 0618  GLUCAP 258* 113* 68* 106* 110*    Studies: Dg Chest 2 View  10/31/2012   *RADIOLOGY REPORT*  Clinical Data: Shortness of breath for 5 days, question pneumonia, history hypertension, diabetes  CHEST - 2 VIEW  Comparison: 10/30/2012  Findings: Enlargement of cardiac silhouette. Mediastinal contours normal with atherosclerotic calcification of aortic arch. Patchy airspace infiltrates greatest left perihilar favor pneumonia over edema.  No gross pleural effusion or pneumothorax. Bones unremarkable.  IMPRESSION: Perihilar infiltrates greatest in left upper lobe question pneumonia.   Original Report Authenticated By: Jack Chung, M.D.    Scheduled Meds: . allopurinol  100 mg Oral BID  . amLODipine  10 mg Oral Daily  . aspirin  325 mg Oral Daily  . calcium-vitamin D  1 tablet Oral BID  . darbepoetin (ARANESP) injection - NON-DIALYSIS  100 mcg Subcutaneous Q Wed-1800  . doxycycline  100 mg Oral Q12H  . ferrous sulfate  325 mg Oral QAC supper  . furosemide  160 mg Intravenous Q6H  . heparin  5,000 Units Subcutaneous Q8H  . insulin aspart  0-9 Units Subcutaneous TID WC  . insulin glargine  30 Units Subcutaneous q morning - 10a  . levothyroxine  112 mcg Oral Daily  .  magnesium oxide  400 mg Oral Daily  . metoprolol  100 mg Oral BID  . predniSONE  1 mg Oral BID WC  . sodium chloride  3 mL Intravenous Q12H   Continuous Infusions:   Time spent: 35 minutes  Jack Chung L  [facility] Pager [phone]. If 7PM-7AM, please contact night-coverage at [email or web address], password [facility] 11/01/2012, 3:43 PM  LOS: 2 days

## 2012-11-01 NOTE — H&P (Signed)
HPI: Jack Chung is an 72 y.o. male with worsening chronic renal failure. He is now in need of dialysis. He is thinking he may move forward with PD, but for the interim he needs HD. IR is requested to place HD catheter. PMHx, meds, chart reviewed.   Past Medical History:  Past Medical History  Diagnosis Date  . Hypertension   . Diabetes mellitus   . Chronic kidney disease     was on dialysis in July 2011  . Hypothyroidism   . CHF (congestive heart failure)   . Anemia   . Blind left eye   . Sleep apnea     not on CPAP  . Coronary artery disease   . Arthritis   . Sarcoidosis     Hx:of  . Diabetic retinopathy     Hx: of bilateral  . Edema   . Hyperlipidemia   . 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 Atherosclerosis of native arteries of the extremities, unspecified   . Eczema     Hx: of    Past Surgical History:  Past Surgical History  Procedure Laterality Date  . Cardiac surgery      total of 6 surgeries, 5 related to mrsa  . Coronary artery bypass graft  06/2009  . Cataract surgery      Hx: of  . Debridements      Hx: of secondary to MRSA  . Av fistula placement Right 10/11/2012    Procedure: ARTERIOVENOUS (AV) FISTULA CREATION;  Surgeon: Larina Earthly, MD;  Location: [facility] OR;  Service: Vascular;  Laterality: Right;    Family History:  Family History  Problem Relation Age of Onset  . COPD Mother   . COPD Father     Social History:  reports that he has never smoked. He has never used smokeless tobacco. He reports that he does not drink alcohol or use illicit drugs.  Allergies:  Allergies  Allergen Reactions  . Flu Virus Vaccine     Gets the flu  . Lipitor (Atorvastatin)     Leg cramps    Medications: Medications Prior to Admission  Medication Sig Dispense Refill  . allopurinol (ZYLOPRIM) 100 MG tablet Take 100 mg by mouth 3 (three) times daily.       Marland Kitchen amLODipine (NORVASC) 10 MG tablet Take 10 mg by mouth daily.      Marland Kitchen aspirin  325 MG tablet Take 325 mg by mouth daily.      . Calcium Carbonate-Vitamin D (CALCIUM + D PO) Take 1 tablet by mouth 2 (two) times daily.      . Cranberry 300 MG tablet Take 300 mg by mouth 2 (two) times daily.      . ferrous sulfate 325 (65 FE) MG tablet Take 325 mg by mouth daily before supper.       . furosemide (LASIX) 40 MG tablet Take 80 mg by mouth 2 (two) times daily.       . insulin aspart (NOVOLOG) 100 UNIT/ML injection Inject 2-8 Units into the skin 3 (three) times daily with meals. Take none if blood sugar < 150, 151-200 2 units, 201-250 4 units, 251-300 6 units, 301-350 8 units      . insulin glargine (LANTUS) 100 UNIT/ML injection Inject 0.3 mLs (30 Units total) into the skin every morning.  10 mL  3  . levothyroxine (SYNTHROID, LEVOTHROID) 112 MCG tablet Take 112 mcg by mouth daily.      Marland Kitchen  magnesium oxide (MAG-OX) 400 MG tablet Take 400 mg by mouth daily.      . metoprolol (LOPRESSOR) 100 MG tablet Take 100 mg by mouth 2 (two) times daily.      Marland Kitchen oxyCODONE (ROXICODONE) 5 MG immediate release tablet Take 1 tablet (5 mg total) by mouth every 4 (four) hours as needed for pain.  20 tablet  0  . predniSONE (DELTASONE) 1 MG tablet Take 1 mg by mouth 2 (two) times daily.        Please HPI for pertinent positives, otherwise complete 10 system ROS negative.  Physical Exam: BP 117/64  Pulse 67  Temp(Src) 97.7 F (36.5 C) (Oral)  Resp 18  Ht 5\' 10"  (1.778 m)  Wt 242 lb 6.4 oz (109.952 kg)  BMI 34.78 kg/m2  SpO2 94% Body mass index is 34.78 kg/(m^2).   General Appearance:  Alert, cooperative, no distress, appears stated age  Head:  Normocephalic, without obvious abnormality, atraumatic  ENT: Unremarkable  Neck: Supple, symmetrical, trachea midline  Lungs:   Clear to auscultation bilaterally, no w/r/r, respirations unlabored without use of accessory muscles.  Chest Wall:  No tenderness or deformity  Heart:  Regular rate and rhythm, S1, S2 normal, no murmur, rub or gallop.   Extremities: Extremities normal, atraumatic, no cyanosis or edema  Neurologic: Normal affect, no gross deficits.   Results for orders placed during the hospital encounter of 10/30/12 (from the past 48 hour(s))  FERRITIN     Status: Abnormal   Collection Time    10/30/12  4:00 PM      Result Value Range   Ferritin 641 (*) 22 - 322 ng/mL  IRON AND TIBC     Status: Abnormal   Collection Time    10/30/12  4:00 PM      Result Value Range   Iron 25 (*) 42 - 135 ug/dL   TIBC 914 (*) 782 - 956 ug/dL   Saturation Ratios 14 (*) 20 - 55 %   UIBC 154  125 - 400 ug/dL  PARATHYROID HORMONE, INTACT (NO CA)     Status: Abnormal   Collection Time    10/30/12  4:00 PM      Result Value Range   PTH 81.8 (*) 14.0 - 72.0 pg/mL  GLUCOSE, CAPILLARY     Status: Abnormal   Collection Time    10/30/12  4:26 PM      Result Value Range   Glucose-Capillary 171 (*) 70 - 99 mg/dL   Comment 1 Notify RN    GLUCOSE, CAPILLARY     Status: Abnormal   Collection Time    10/30/12  9:18 PM      Result Value Range   Glucose-Capillary 163 (*) 70 - 99 mg/dL   Comment 1 Notify RN     Comment 2 Documented in Chart    PROTEIN / CREATININE RATIO, URINE     Status: Abnormal   Collection Time    10/31/12  2:08 AM      Result Value Range   Creatinine, Urine 49.30     Total Protein, Urine 23.7     Comment: NO NORMAL RANGE ESTABLISHED FOR THIS TEST   PROTEIN CREATININE RATIO 0.48 (*) 0.00 - 0.15  IMMUNOFIXATION ELECTROPHORESIS, URINE (WITH TOT PROT)     Status: Abnormal   Collection Time    10/31/12  2:08 AM      Result Value Range   Time RANDOM     Comment: CORRECTED ON 05/28 AT  1457: PREVIOUSLY REPORTED AS URINE, CLEAN CATCH, CORRECTED ON 05/27 AT 0215: PREVIOUSLY REPORTED AS URINE, RANDOM, CORRECTED ON 05/27 AT 1610: PREVIOUSLY REPORTED AS 24   Volume, Urine RANDOM     Comment: CORRECTED ON 05/28 AT 1457: PREVIOUSLY REPORTED AS URINE, CLEAN CATCH, CORRECTED ON 05/27 AT 0215: PREVIOUSLY REPORTED AS URINE, RANDOM    Total Protein, Urine 29.2     Comment: No established reference range.   Total Protein, Urine-Ur/day NOT CALC  10 - 140 mg/day   Comment: (NOTE)     Total urinary protein is determined by adding the albumin and Kappa     and/or Lambda light chains.  This value may not agree with the total     protein as determined by chemical methods, which characteristically     underestimate urinary light chains.   Albumin, U PENDING     Alpha 1, Urine PENDING     Alpha 2, Urine PENDING     Beta, Urine PENDING     Gamma Globulin, Urine PENDING     Free Kappa Lt Chains,Ur 17.60 (*) 0.14 - 2.42 mg/dL   Free Lt Chn Excr Rate NOT CALC     Free Lambda Lt Chains,Ur 1.37 (*) 0.02 - 0.67 mg/dL   Free Lambda Excretion/Day NOT CALC     Free Kappa/Lambda Ratio 12.85 (*) 2.04 - 10.37 ratio   Comment: (NOTE)   Immunofixation, Urine PENDING    GLUCOSE, CAPILLARY     Status: Abnormal   Collection Time    10/31/12  6:09 AM      Result Value Range   Glucose-Capillary 219 (*) 70 - 99 mg/dL   Comment 1 Notify RN    APTT     Status: Abnormal   Collection Time    10/31/12  7:10 AM      Result Value Range   aPTT 38 (*) 24 - 37 seconds   Comment:            IF BASELINE aPTT IS ELEVATED,     SUGGEST PATIENT RISK ASSESSMENT     BE USED TO DETERMINE APPROPRIATE     ANTICOAGULANT THERAPY.  PROTIME-INR     Status: None   Collection Time    10/31/12  7:10 AM      Result Value Range   Prothrombin Time 14.1  11.6 - 15.2 seconds   INR 1.10  0.00 - 1.49  GLUCOSE, CAPILLARY     Status: Abnormal   Collection Time    10/31/12 11:19 AM      Result Value Range   Glucose-Capillary 258 (*) 70 - 99 mg/dL   Comment 1 Notify RN    BASIC METABOLIC PANEL     Status: Abnormal   Collection Time    10/31/12  1:00 PM      Result Value Range   Sodium 133 (*) 135 - 145 mEq/L   Potassium 4.3  3.5 - 5.1 mEq/L   Chloride 94 (*) 96 - 112 mEq/L   CO2 21  19 - 32 mEq/L   Glucose, Bld 245 (*) 70 - 99 mg/dL   BUN 91 (*) 6 - 23  mg/dL   Creatinine, Ser 9.60 (*) 0.50 - 1.35 mg/dL   Calcium 9.0  8.4 - 45.4 mg/dL   GFR calc non Af Amer 12 (*) >90 mL/min   GFR calc Af Amer 13 (*) >90 mL/min   Comment:            The eGFR has been  calculated     using the CKD EPI equation.     This calculation has not been     validated in all clinical     situations.     eGFR's persistently     <90 mL/min signify     possible Chronic Kidney Disease.  GLUCOSE, CAPILLARY     Status: Abnormal   Collection Time    10/31/12  4:19 PM      Result Value Range   Glucose-Capillary 113 (*) 70 - 99 mg/dL   Comment 1 Notify RN    GLUCOSE, CAPILLARY     Status: Abnormal   Collection Time    10/31/12  9:35 PM      Result Value Range   Glucose-Capillary 68 (*) 70 - 99 mg/dL   Comment 1 Notify RN     Comment 2 Documented in Chart    GLUCOSE, CAPILLARY     Status: Abnormal   Collection Time    10/31/12 10:03 PM      Result Value Range   Glucose-Capillary 106 (*) 70 - 99 mg/dL   Comment 1 Notify RN     Comment 2 Documented in Chart    RENAL FUNCTION PANEL     Status: Abnormal   Collection Time    11/01/12  4:50 AM      Result Value Range   Sodium 134 (*) 135 - 145 mEq/L   Potassium 4.5  3.5 - 5.1 mEq/L   Chloride 95 (*) 96 - 112 mEq/L   CO2 27  19 - 32 mEq/L   Glucose, Bld 124 (*) 70 - 99 mg/dL   BUN 96 (*) 6 - 23 mg/dL   Creatinine, Ser 1.61 (*) 0.50 - 1.35 mg/dL   Calcium 8.7  8.4 - 09.6 mg/dL   Phosphorus 3.3  2.3 - 4.6 mg/dL   Albumin 2.5 (*) 3.5 - 5.2 g/dL   GFR calc non Af Amer 11 (*) >90 mL/min   GFR calc Af Amer 12 (*) >90 mL/min   Comment:            The eGFR has been calculated     using the CKD EPI equation.     This calculation has not been     validated in all clinical     situations.     eGFR's persistently     <90 mL/min signify     possible Chronic Kidney Disease.  CBC     Status: Abnormal   Collection Time    11/01/12  4:50 AM      Result Value Range   WBC 14.4 (*) 4.0 - 10.5 K/uL   RBC 2.49 (*) 4.22  - 5.81 MIL/uL   Hemoglobin 7.6 (*) 13.0 - 17.0 g/dL   Comment: DELTA CHECK NOTED     REPEATED TO VERIFY   HCT 22.9 (*) 39.0 - 52.0 %   MCV 92.0  78.0 - 100.0 fL   MCH 30.5  26.0 - 34.0 pg   MCHC 33.2  30.0 - 36.0 g/dL   RDW 04.5  40.9 - 81.1 %   Platelets 314  150 - 400 K/uL  GLUCOSE, CAPILLARY     Status: Abnormal   Collection Time    11/01/12  6:18 AM      Result Value Range   Glucose-Capillary 110 (*) 70 - 99 mg/dL   Dg Chest 2 View  02/19/7828   *RADIOLOGY REPORT*  Clinical Data: Shortness of breath for 5 days, question pneumonia,  history hypertension, diabetes  CHEST - 2 VIEW  Comparison: 10/30/2012  Findings: Enlargement of cardiac silhouette. Mediastinal contours normal with atherosclerotic calcification of aortic arch. Patchy airspace infiltrates greatest left perihilar favor pneumonia over edema. No gross pleural effusion or pneumothorax. Bones unremarkable.  IMPRESSION: Perihilar infiltrates greatest in left upper lobe question pneumonia.   Original Report Authenticated By: Ulyses Southward, M.D.    Assessment/Plan Chronic renal failure. Discussed HD access cathter placement. Pt has had these in the past, none recently. Explained procedure, risks, complications, use of sedation. WBC elevated at 14.4, etiology?, empirically on abx for ?PNA on CXR, but not symptomatic and afebrile. Also on chronic steroids. NPO p MN Plan for HD cath in am. Consent signed in chart  Brayton El PA-C 11/01/2012, 3:57 PM

## 2012-11-02 ENCOUNTER — Ambulatory Visit: Payer: Medicare Other | Admitting: Physical Therapy

## 2012-11-02 ENCOUNTER — Encounter (HOSPITAL_COMMUNITY): Payer: Self-pay | Admitting: Nephrology

## 2012-11-02 ENCOUNTER — Inpatient Hospital Stay (HOSPITAL_COMMUNITY): Payer: Medicare Other

## 2012-11-02 LAB — RENAL FUNCTION PANEL
Albumin: 3.1 g/dL — ABNORMAL LOW (ref 3.5–5.2)
BUN: 99 mg/dL — ABNORMAL HIGH (ref 6–23)
CO2: 23 mEq/L (ref 19–32)
Chloride: 92 mEq/L — ABNORMAL LOW (ref 96–112)
GFR calc non Af Amer: 11 mL/min — ABNORMAL LOW (ref >90)
Potassium: 3.9 mEq/L (ref 3.5–5.1)

## 2012-11-02 LAB — GLUCOSE, CAPILLARY
Glucose-Capillary: 61 mg/dL — ABNORMAL LOW (ref 70–99)
Glucose-Capillary: 128 mg/dL — ABNORMAL HIGH (ref 70–99)

## 2012-11-02 LAB — UIFE/LIGHT CHAINS/TP QN, 24-HR UR
Albumin, U: DETECTED
Alpha 1, Urine: DETECTED — AB
Alpha 2, Urine: DETECTED — AB
Beta, Urine: DETECTED — AB
Free Kappa/Lambda Ratio: 12.85 ratio — ABNORMAL HIGH (ref 2.04–10.37)
Total Protein, Urine: 29.2 mg/dL

## 2012-11-02 LAB — CBC
HCT: 26.3 % — ABNORMAL LOW (ref 39.0–52.0)
RBC: 2.85 MIL/uL — ABNORMAL LOW (ref 4.22–5.81)
RDW: 14 % (ref 11.5–15.5)
WBC: 12.8 10*3/uL — ABNORMAL HIGH (ref 4.0–10.5)

## 2012-11-02 LAB — HEPATITIS B SURFACE ANTIGEN: Hepatitis B Surface Ag: NEGATIVE

## 2012-11-02 MED ORDER — METOPROLOL TARTRATE 50 MG PO TABS
50.0000 mg | ORAL_TABLET | Freq: Two times a day (BID) | ORAL | Status: DC
  Administered 2012-11-03: 50 mg via ORAL
  Filled 2012-11-02 (×3): qty 1

## 2012-11-02 MED ORDER — LIDOCAINE-PRILOCAINE 2.5-2.5 % EX CREA: 1.0000 "application " | TOPICAL_CREAM | CUTANEOUS | Status: DC | PRN

## 2012-11-02 MED ORDER — HEPARIN SODIUM (PORCINE) 1000 UNIT/ML DIALYSIS
20.0000 [IU]/kg | INTRAMUSCULAR | Status: DC | PRN
  Administered 2012-11-02: 2200 [IU] via INTRAVENOUS_CENTRAL

## 2012-11-02 MED ORDER — HEPARIN SODIUM (PORCINE) 1000 UNIT/ML DIALYSIS: 1000.0000 [IU] | INTRAMUSCULAR | Status: DC | PRN

## 2012-11-02 MED ORDER — PENTAFLUOROPROP-TETRAFLUOROETH EX AERO: 1.0000 "application " | INHALATION_SPRAY | CUTANEOUS | Status: DC | PRN

## 2012-11-02 MED ORDER — AMLODIPINE BESYLATE 5 MG PO TABS
5.0000 mg | ORAL_TABLET | Freq: Every day | ORAL | Status: DC
  Administered 2012-11-03: 5 mg via ORAL
  Filled 2012-11-02: qty 1

## 2012-11-02 MED ORDER — SODIUM CHLORIDE 0.9 % IV SOLN: 100.0000 mL | INTRAVENOUS | Status: DC | PRN

## 2012-11-02 MED ORDER — DEXTROSE 50 % IV SOLN
INTRAVENOUS | Status: AC
  Filled 2012-11-02: qty 50

## 2012-11-02 MED ORDER — ALTEPLASE 2 MG IJ SOLR: 2.0000 mg | Freq: Once | INTRAMUSCULAR | Status: DC | PRN

## 2012-11-02 MED ORDER — DEXTROSE 50 % IV SOLN
12.5000 g | Freq: Once | INTRAVENOUS | Status: AC
  Administered 2012-11-02: 12.5 g via INTRAVENOUS

## 2012-11-02 MED ORDER — LIDOCAINE HCL (PF) 1 % IJ SOLN: 5.0000 mL | INTRAMUSCULAR | Status: DC | PRN

## 2012-11-02 MED ORDER — FENTANYL CITRATE 0.05 MG/ML IJ SOLN
INTRAMUSCULAR | Status: AC | PRN
  Administered 2012-11-02: 25 ug via INTRAVENOUS
  Administered 2012-11-02: 50 ug via INTRAVENOUS
  Administered 2012-11-02: 25 ug via INTRAVENOUS

## 2012-11-02 MED ORDER — NEPRO/CARBSTEADY PO LIQD: 237.0000 mL | ORAL | Status: DC | PRN

## 2012-11-02 MED ORDER — MIDAZOLAM HCL 2 MG/2ML IJ SOLN
INTRAMUSCULAR | Status: AC | PRN
  Administered 2012-11-02: 2 mg via INTRAVENOUS

## 2012-11-02 NOTE — Procedures (Signed)
Placement of left jugular dialysis catheter.  Tip in SVC and ready to use.  No immediate complication.

## 2012-11-02 NOTE — Progress Notes (Signed)
[facility]  Subjective:  NPO--waiting for IR to place dialysis cath.  Says coughing is better.     Objective: Vital signs in last 24 hours: Blood pressure 110/48, pulse 56, temperature 98 F (36.7 C), temperature source Oral, resp. rate 14, height 5\' 10"  (1.778 m), weight 109.6 kg (241 lb 10 oz), SpO2 96.00%.    PHYSICAL EXAM General-awake, alert, not wearing 02 today Chest-crackles in bases  Heart-no rub  Abd-nontender  Extr-AVF patent R upper arm, trace edema  1400 cc urine out last 24 hr. Weight stable 109.6 kg Lab Results:   Recent Labs Lab 10/31/12 1300 11/01/12 0450 11/02/12 0525  NA 133* 134* 133*  K 4.3 4.5 3.9  CL 94* 95* 92*  CO2 21 27 23   BUN 91* 96* 99*  CREATININE 4.70* 5.03* 5.03*  GLUCOSE 245* 124* 89  CALCIUM 9.0 8.7 9.6  PHOS  --  3.3 3.4     Recent Labs  11/01/12 0450 11/02/12 0525  WBC 14.4* 12.8*  HGB 7.6* 8.6*  HCT 22.9* 26.3*  PLT 314 349     I have reviewed the patient's current medications. Scheduled: . allopurinol  100 mg Oral BID  . amLODipine  10 mg Oral Daily  . aspirin  325 mg Oral Daily  . calcium-vitamin D  1 tablet Oral BID  .  ceFAZolin (ANCEF) IV  2 g Intravenous On Call  . darbepoetin (ARANESP) injection - NON-DIALYSIS  100 mcg Subcutaneous Q Wed-1800  . doxycycline  100 mg Oral Q12H  . ferrous sulfate  325 mg Oral QAC supper  . furosemide  160 mg Intravenous Q6H  . heparin  5,000 Units Subcutaneous Q8H  . insulin aspart  0-9 Units Subcutaneous TID WC  . levothyroxine  112 mcg Oral Daily  . magnesium oxide  400 mg Oral Daily  . metoprolol  100 mg Oral BID  . predniSONE  1 mg Oral BID WC  . sodium chloride  3 mL Intravenous Q12H   Continuous:   Assessment/Plan: 1. 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 glucose intolerance/recent hypoglycemia--follow CBGs, prednisone is now back to 1 mg twice a day   C. probable nephropathy- urine protein/creatinine ratio 0.5  2. High BP--BP low  today--will decrease amlodipine to 5/d and metoprolol to 50 BID and hold for systolic < 130  3. 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 PTH 82. He is interested in peritoneal dialysis. PT 14 sec, INR 1.1. Will plan to PD catheter once breathing better. Will request TDC placed for HD in AM, then dialyze  4. CHF-- continue furosemide to 160 IV every 6 hr. 2-D echo done 10/05/2012 showed EF 55-60% and no WMA  5. Past history sarcoidosis--wife says he has not been treated for this [phone].  6. Gout--currently on allopurinol 300/D. and was on prednisone 1 MG twice a day PTA. Uric acid 7.9 on 26 May  7. Anemia--On feraheme (IV Fe) and aranesp. Hgb 8.6 today  8. Hypothyroidism--continue with replacement.     LOS: 3 days   Rozell Theiler F 11/02/2012,11:12 AM   .labalb

## 2012-11-02 NOTE — Progress Notes (Signed)
Back from IR , Vascath intact.  IV site unremarkable.  Hemo notified of client's return to unit.

## 2012-11-02 NOTE — Progress Notes (Signed)
Chart reviewed.  Patient in procedure.

## 2012-11-02 NOTE — Progress Notes (Signed)
Hypoglycemic Event  CBG: 61  Treatment: Dextrose 12.5g  Symptoms: asymptomatic  Follow-up CBG: Time:1230 CBG Result:72  Possible Reasons for Event: client NPO for procedure  Comments/MD notified:1101    [name]  Remember to initiate Hypoglycemia Order Set & complete

## 2012-11-02 NOTE — Clinical Documentation Improvement (Signed)
CHF DOCUMENTATION CLARIFICATION QUERY   CLINICAL DOCUMENTATION QUERIES ARE NOT PART OF THE PERMANENT MEDICAL RECORD   Please update your documentation within the medical record to reflect your response to this query.                                                                                      11/02/12   Dr. Lendell Caprice,   In a better effort to capture your patient's severity of illness, reflect appropriate length of stay and utilization of resources, a review of the patient medical record has revealed the following information:   - Patient admitted with HCAP and CHF per H&P    - "EF ok. Echo shows diastolic dysfunction" Author: Christiane Ha, MD      Note Time: 11/01/2012 3:43 PM   Echo   10/31/12 Study Conclusions - Left ventricle: The cavity size was mildly dilated. Wall thickness was increased in a pattern of mild LVH. Systolic function was normal. The estimated ejection fraction was in the range of 50% to 55%. Wall motion was normal; there were no regional wall motion abnormalities. Features are consistent with a pseudonormal left ventricular filling pattern, with concomitant abnormal relaxation and increased filling pressure (grade 2 diastolic dysfunction). Doppler parameters are consistent with high ventricular filling pressure. - Mitral valve: Calcified annulus. Mild regurgitation. - Left atrium: The atrium was moderately dilated. - Right atrium: The atrium was mildly dilated. - Pulmonary arteries: Systolic pressure was mildly increased. PA peak pressure: 44mm Hg (S).     Based on your clinical judgment, please document the ACUITY and TYPE of Heart Failure monitored and treated this admission in the progress notes and discharge summary:  ACUITY:  - Acute  - Chronic  - Acute on Chronic  AND  TYPE:  - Systolic  - Diastolic  - Combined    In responding to this query please exercise your independent judgment.     The fact that a query is asked, does  not imply that any particular answer is desired or expected.   Reviewed:  no additional documentation provided  Thank You,  Jerral Ralph  RN BSN CCDS Certified Clinical Documentation Specialist: Cell   [phone]  Health Information Management [place]    TO RESPOND TO THE THIS QUERY, FOLLOW THE INSTRUCTIONS BELOW:  1. If needed, update documentation for the patient's encounter via the notes activity.  2. Access this query again and click edit on the In [employer].  3. After updating, or not, click F2 to complete all highlighted (required) fields concerning your review. Select "additional documentation in the medical record" OR "no additional documentation provided".  4. Click Sign note button.  5. The deficiency will fall out of your In Basket *Please let us know if you are not able to complete this workflow by phone or e-mail (listed below).

## 2012-11-02 NOTE — Clinical Documentation Improvement (Signed)
GENERIC DOCUMENTATION CLARIFICATION QUERY   CLINICAL DOCUMENTATION QUERIES ARE NOT PART OF THE PERMANENT MEDICAL RECORD    Please update your documentation within the medical record to reflect your response to this query.                                                                                          11/02/12   Dr. Lendell Caprice,   In a better effort to capture your patient's severity of illness, reflect appropriate length of stay and utilization of resources, a review of the patient medical record has revealed the following information:    - Known history of Hypertension per H&P   - Known history of CHF per H&P   - Grade 2 Diastolic Dysfunction per Echo 16/10/96   - "Mild Cardiomegaly" by CXR 10/04/12    - "The heart is mildly enlarged" by CXR 10/30/12        - "Enlargement of cardiac silhouette." by CXR 10/31/12        Based on your clinical judgment, please document in the progress notes and discharge summary if the patient has:   - Hypertensive Heart Disease   - Other Condition   - Unable to Clinically Determine    In responding to this query please exercise your independent judgment.     The fact that a query is asked, does not imply that any particular answer is desired or expected.  Reviewed: Already answered.  Please do not send more than one query, plus emails, etc.  Thank You,  Jerral Ralph  RN BSN CCDS Certified Clinical Documentation Specialist: Cell   [phone]  Health Information Management [place]  TO RESPOND TO THE THIS QUERY, FOLLOW THE INSTRUCTIONS BELOW:  1. If needed, update documentation for the patient's encounter via the notes activity.  2. Access this query again and click edit on the In [employer].  3. After updating, or not, click F2 to complete all highlighted (required) fields concerning your review. Select "additional documentation in the medical record" OR "no additional documentation provided".  4. Click  Sign note button.  5. The deficiency will fall out of your In Basket *Please let us know if you are not able to complete this workflow by phone or e-mail (listed below).

## 2012-11-03 ENCOUNTER — Ambulatory Visit: Payer: Medicare Other | Admitting: Physical Therapy

## 2012-11-03 LAB — CBC WITH DIFFERENTIAL/PLATELET
HCT: 28.2 % — ABNORMAL LOW (ref 39.0–52.0)
Hemoglobin: 9.2 g/dL — ABNORMAL LOW (ref 13.0–17.0)
Lymphocytes Relative: 8 % — ABNORMAL LOW (ref 12–46)
Monocytes Absolute: 1 10*3/uL (ref 0.1–1.0)
Monocytes Relative: 10 % (ref 3–12)
Neutro Abs: 7.3 10*3/uL (ref 1.7–7.7)
WBC: 9.6 10*3/uL (ref 4.0–10.5)

## 2012-11-03 LAB — HEPATITIS B CORE ANTIBODY, TOTAL: Hep B Core Total Ab: NEGATIVE

## 2012-11-03 LAB — RENAL FUNCTION PANEL
CO2: 30 mEq/L (ref 19–32)
GFR calc Af Amer: 16 mL/min — ABNORMAL LOW (ref >90)
Glucose, Bld: 140 mg/dL — ABNORMAL HIGH (ref 70–99)
Potassium: 4 mEq/L (ref 3.5–5.1)
Sodium: 136 mEq/L (ref 135–145)

## 2012-11-03 MED ORDER — METOPROLOL TARTRATE 25 MG PO TABS
25.0000 mg | ORAL_TABLET | Freq: Two times a day (BID) | ORAL | Status: DC
  Administered 2012-11-03 – 2012-11-08 (×9): 25 mg via ORAL
  Filled 2012-11-03 (×11): qty 1

## 2012-11-03 MED FILL — Gelatin Absorbable Sponge 12-7 MM: CUTANEOUS | Qty: 1 | Status: AC

## 2012-11-03 MED FILL — Heparin Sodium (Porcine) Inj 1000 Unit/ML: INTRAMUSCULAR | Qty: 10 | Status: AC

## 2012-11-03 NOTE — Progress Notes (Signed)
Met with patient and wife at bedside to discuss [facility] Care Management services as a benefit of his [employer]. Consents were signed in the recent past. However, this writer was unable to contact with the number previously provided. Confirmed telephone number again as [phone]. Patient's wife reports this is the best contact number. Informed both patient and wife that [facility] Care Management will not interfere with any home health services that may be arranged. Left contact information with wife.  Raiford Noble, MSN- Ed, RN,BSN- [facility] [phone]

## 2012-11-03 NOTE — Progress Notes (Signed)
Inpatient Diabetes Program Recommendations  AACE/ADA: New Consensus Statement on Inpatient Glycemic Control (2013)  Target Ranges:  Prepandial:   less than 140 mg/dL      Peak postprandial:   less than 180 mg/dL (1-2 hours)      Critically ill patients:  140 - 180 mg/dL   Hyperglycemia into 161'W  Inpatient Diabetes Program Recommendations Insulin - Basal: Lantus 30 units discontinued. Please start back at 15 units qd Or consider increae correction to moderate tidwc.  Thank you, Lenor Coffin, RN, CNS, Diabetes Coordinator [phone])

## 2012-11-03 NOTE — Progress Notes (Signed)
Chart reviewed.  D/W Dr. Caryn Section  [facility] PROGRESS NOTE  Jack Chung [name]:[id number] DOB: [birth year] DOA: 10/30/2012 PCP: [name], MD  Assessment/Plan: 1-SOB:due to fluid overload and pneumonia.  Improving.  2-CKD, now end stage:  Tolerated dialysis. Await specific plans from nephrology  3-DM: continue SSI  4-Gout: continue prednisone and change allopurinol to bid  5-hypothyroidism: continue synthroid  6-HTN: continue current meds  7-Anemia: improved  Code Status: Full Family Communication: wife at bedside Disposition Plan: home when medically stable   Consultants:  Renal service  Procedures:  2-D echo pending  Antibiotics:  vanc and cefepime 5/25 -- 5/27  Doxycycline 5/27  HPI/Subjective: No complaints.  Feels better after HD  Objective: Filed Vitals:   11/03/12 0623 11/03/12 0629 11/03/12 1002 11/03/12 1425  BP: 116/53  111/51 107/58  Pulse: 80  66 62  Temp: 98.7 F (37.1 C)   98.7 F (37.1 C)  TempSrc: Oral   Oral  Resp: 18   18  Height:      Weight:  103.647 kg (228 lb 8 oz)    SpO2: 97%   96%    Intake/Output Summary (Last 24 hours) at 11/03/12 1832 Last data filed at 11/03/12 1628  Gross per 24 hour  Intake    680 ml  Output   5075 ml  Net  -4395 ml   Filed Weights   11/02/12 0520 11/02/12 1915 11/03/12 0629  Weight: 109.6 kg (241 lb 10 oz) 108.6 kg (239 lb 6.7 oz) 103.647 kg (228 lb 8 oz)    Exam:   General:  NAD; comfortable  Cardiovascular: S1 and S2, no rubs or gallops  Respiratory: CTA without WRR  Abdomen: soft, NT, ND, positive BS  Musculoskeletal: trace edema bilaterally, no cyanosis  Psych:  Brighter today  Data Reviewed: Basic Metabolic Panel:  Recent Labs Lab 10/30/12 0015 10/30/12 0800 10/31/12 1300 11/01/12 0450 11/02/12 0525 11/03/12 0605  NA 137  --  133* 134* 133* 136  K 4.2  --  4.3 4.5 3.9 4.0  CL 96  --  94* 95* 92* 93*  CO2 26  --  21 27 23 30   GLUCOSE 118*  --  245* 124*  89 140*  BUN 83*  --  91* 96* 99* 66*  CREATININE 4.53* 4.53* 4.70* 5.03* 5.03* 3.95*  CALCIUM 9.4  --  9.0 8.7 9.6 9.6  PHOS  --   --   --  3.3 3.4 3.1   CBC:  Recent Labs Lab 10/30/12 0015 10/30/12 0800 11/01/12 0450 11/02/12 0525 11/03/12 0655  WBC 7.8 7.6 14.4* 12.8* 9.6  NEUTROABS  --   --   --   --  7.3  HGB 10.1* 9.2* 7.6* 8.6* 9.2*  HCT 30.4* 28.2* 22.9* 26.3* 28.2*  MCV 90.7 92.5 92.0 92.3 92.8  PLT 386 351 314 349 367   BNP (last 3 results)  Recent Labs  10/30/12 0030  PROBNP 17301.0*   CBG:  Recent Labs Lab 11/02/12 1225 11/02/12 2300 11/03/12 0616 11/03/12 1112 11/03/12 1626  GLUCAP 74 128* 145* 248* 274*    Studies: Ir Fluoro Guide Cv Line Left  11/02/2012   *RADIOLOGY REPORT*  Indication: 72 year old with end-stage renal disease.  Recently placed right arm fistula.  The patient needs hemodialysis until the fistula mature.  PROCEDURE(S): FLUOROSCOPIC AND ULTRASOUND GUIDED PLACEMENT OF A TUNNELED DIAYSIS CATHETER  Physician:  Rachelle Hora. Henn, MD  Medications: Versed 2 mg, Fentanyl 100 mcg. A radiology nurse monitored the patient  for moderate sedation.  As antibiotic prophylaxis, Ancef  was ordered pre-procedure and administered intravenously within one hour of incision.  Moderate sedation time: 43 minutes  Fluoroscopy time: 6 minutes and 36 seconds  Procedure:Informed consent was obtained for placement of a tunneled dialysis catheter.  The patient was placed supine on the interventional table.  The left side of the chest was selected due to the recently placed right arm fistula.  Ultrasound confirmed a patent left internal jugularvein.  Ultrasound images were obtained for documentation.  The left side of the neck was prepped and draped in a sterile fashion.  The left side of the neck was anesthetized with 1% lidocaine.  Maximal barrier sterile technique was utilized including caps, mask, sterile gowns, sterile gloves, sterile drape, hand hygiene and skin  antiseptic.  A small incision was made with #11 blade scalpel.  A 21 gauge needle directed into the left internal jugular vein with ultrasound guidance.  A micropuncture dilator set was placed. The wire would not easily advance into the SVC.  A Kumpe catheter and a Bentson wire used to cannulate the SVC.  A 27 cm tip to cuff HemoSplit catheter was selected.  The skin below the left clavicle was anesthetized and a small incision was made with an #11 blade scalpel.  A subcutaneous tunnel was formed to the vein dermatotomy site.  The catheter was brought through the tunnel.  The vein dermatotomy site was dilated to accommodate a peel-away sheath.  The catheter was placed through the peel-away sheath and directed into the central venous structures. The catheter preferentially went into the right innominate vein.  Catheter was pulled back and directed into the SVC with stiff Glidewires and stiff Amplatz wires.  Redirecting the catheter into the SVC was very tedious and difficult.  Eventually, the tip of the catheter was placed in the lower SVC.  Both lumens aspirated and flushed well.  Fluoroscopic images were obtained for documentation.   The proper amount of heparin was flushed in both lumens.  The vein dermatotomy site was closed using a single layer of absorbable suture and Dermabond.  The catheter was secured to the skin using Prolene suture.  Findings:Catheter tip in the superior vena cava.  Complications: None  Impression:Successful placement of a left jugular tunneled dialysis catheter using ultrasound and fluoroscopic guidance.   Original Report Authenticated By: Richarda Overlie, M.D.   Ir US Guide Vasc Access Left  11/02/2012   *RADIOLOGY REPORT*  Indication: 72 year old with end-stage renal disease.  Recently placed right arm fistula.  The patient needs hemodialysis until the fistula mature.  PROCEDURE(S): FLUOROSCOPIC AND ULTRASOUND GUIDED PLACEMENT OF A TUNNELED DIAYSIS CATHETER  Physician:  Rachelle Hora. Henn, MD   Medications: Versed 2 mg, Fentanyl 100 mcg. A radiology nurse monitored the patient for moderate sedation.  As antibiotic prophylaxis, Ancef  was ordered pre-procedure and administered intravenously within one hour of incision.  Moderate sedation time: 43 minutes  Fluoroscopy time: 6 minutes and 36 seconds  Procedure:Informed consent was obtained for placement of a tunneled dialysis catheter.  The patient was placed supine on the interventional table.  The left side of the chest was selected due to the recently placed right arm fistula.  Ultrasound confirmed a patent left internal jugularvein.  Ultrasound images were obtained for documentation.  The left side of the neck was prepped and draped in a sterile fashion.  The left side of the neck was anesthetized with 1% lidocaine.  Maximal barrier sterile technique was utilized  including caps, mask, sterile gowns, sterile gloves, sterile drape, hand hygiene and skin antiseptic.  A small incision was made with #11 blade scalpel.  A 21 gauge needle directed into the left internal jugular vein with ultrasound guidance.  A micropuncture dilator set was placed. The wire would not easily advance into the SVC.  A Kumpe catheter and a Bentson wire used to cannulate the SVC.  A 27 cm tip to cuff HemoSplit catheter was selected.  The skin below the left clavicle was anesthetized and a small incision was made with an #11 blade scalpel.  A subcutaneous tunnel was formed to the vein dermatotomy site.  The catheter was brought through the tunnel.  The vein dermatotomy site was dilated to accommodate a peel-away sheath.  The catheter was placed through the peel-away sheath and directed into the central venous structures. The catheter preferentially went into the right innominate vein.  Catheter was pulled back and directed into the SVC with stiff Glidewires and stiff Amplatz wires.  Redirecting the catheter into the SVC was very tedious and difficult.  Eventually, the tip of the  catheter was placed in the lower SVC.  Both lumens aspirated and flushed well.  Fluoroscopic images were obtained for documentation.   The proper amount of heparin was flushed in both lumens.  The vein dermatotomy site was closed using a single layer of absorbable suture and Dermabond.  The catheter was secured to the skin using Prolene suture.  Findings:Catheter tip in the superior vena cava.  Complications: None  Impression:Successful placement of a left jugular tunneled dialysis catheter using ultrasound and fluoroscopic guidance.   Original Report Authenticated By: Richarda Overlie, M.D.    Scheduled Meds: . allopurinol  100 mg Oral BID  . aspirin  325 mg Oral Daily  . calcium-vitamin D  1 tablet Oral BID  . darbepoetin (ARANESP) injection - NON-DIALYSIS  100 mcg Subcutaneous Q Wed-1800  . doxycycline  100 mg Oral Q12H  . ferrous sulfate  325 mg Oral QAC supper  . furosemide  160 mg Intravenous Q6H  . heparin  5,000 Units Subcutaneous Q8H  . insulin aspart  0-9 Units Subcutaneous TID WC  . levothyroxine  112 mcg Oral Daily  . magnesium oxide  400 mg Oral Daily  . metoprolol  25 mg Oral BID  . predniSONE  1 mg Oral BID WC  . sodium chloride  3 mL Intravenous Q12H   Continuous Infusions:   Time spent: 35 minutes  Kareem Cathey L  [facility] Pager [phone]. If 7PM-7AM, please contact night-coverage at [email or web address], password [facility] 11/03/2012, 6:32 PM  LOS: 4 days

## 2012-11-03 NOTE — Plan of Care (Signed)
Problem: Phase II Progression Outcomes Goal: Pain controlled Outcome: Completed/Met Date Met:  11/03/12 Pt has not had any c/o pain Goal: Tolerating diet Outcome: Completed/Met Date Met:  11/03/12 Pt tolerating diet well, no c/o n/v

## 2012-11-03 NOTE — Progress Notes (Signed)
[facility]  Subjective:  DIALYZED LAST NIGHT.Says coughing is better.   Plan HD again tomorrow.  Weight 109.6 pre dialysis--103.6 post dialysis!   Objective: Vital signs in last 24 hours: Blood pressure 107/58, pulse 62, temperature 98.7 F (37.1 C), temperature source Oral, resp. rate 18, height 5\' 10"  (1.778 m), weight 103.647 kg (228 lb 8 oz), SpO2 96.00%.    PHYSICAL EXAM General-awake, alert, not wearing 02 today  Chest-L IJ Tunneled dialysis Cath in place,crackles in bases  Heart-no rub  Abd-nontender  Extr-AVF patent R upper arm, trace pretib edema  2450 cc urine out yesterday    Lab Results:   Recent Labs Lab 11/01/12 0450 11/02/12 0525 11/03/12 0605  NA 134* 133* 136  K 4.5 3.9 4.0  CL 95* 92* 93*  CO2 27 23 30   BUN 96* 99* 66*  CREATININE 5.03* 5.03* 3.95*  GLUCOSE 124* 89 140*  CALCIUM 8.7 9.6 9.6  PHOS 3.3 3.4 3.1     Recent Labs  11/02/12 0525 11/03/12 0655  WBC 12.8* 9.6  HGB 8.6* 9.2*  HCT 26.3* 28.2*  PLT 349 367     I have reviewed the patient's current medications. Scheduled: . allopurinol  100 mg Oral BID  . amLODipine  5 mg Oral Daily  . aspirin  325 mg Oral Daily  . calcium-vitamin D  1 tablet Oral BID  . darbepoetin (ARANESP) injection - NON-DIALYSIS  100 mcg Subcutaneous Q Wed-1800  . doxycycline  100 mg Oral Q12H  . ferrous sulfate  325 mg Oral QAC supper  . furosemide  160 mg Intravenous Q6H  . heparin  5,000 Units Subcutaneous Q8H  . insulin aspart  0-9 Units Subcutaneous TID WC  . levothyroxine  112 mcg Oral Daily  . magnesium oxide  400 mg Oral Daily  . metoprolol  50 mg Oral BID  . predniSONE  1 mg Oral BID WC  . sodium chloride  3 mL Intravenous Q12H    Assessment/Plan: 1. 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 glucose intolerance/recent hypoglycemia--follow CBGs, prednisone is now back to 1 mg twice a day   C. probable nephropathy- urine protein/creatinine ratio 0.5  2. 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 PTH 82. He is interested in peritoneal dialysis. PT 14 sec, INR 1.1. Will plan to PD catheter once breathing better.  Dialyze again tomorrow to weight of 98 kg.  4. CHF-- change  furosemide to 160po BID. 2-D echo done 10/05/2012 showed EF 55-60% and no WMA  5. Past history sarcoidosis--wife says he has not been treated for this [phone].  6. Gout--currently on allopurinol 300/D. and was on prednisone 1 MG twice a day PTA. Uric acid 7.9 on 26 May  7. Anemia--On feraheme (IV Fe) and aranesp. Hgb 8.6 today  8. Hypothyroidism--continue with replacement.      LOS: 4 days   Taressa Rauh F 11/03/2012,2:54 PM   .labalb

## 2012-11-03 NOTE — Progress Notes (Deleted)
Pt states "I'm sick" pt unable to have bowel movement since 5/26. Abdomen distended with bowel sounds presents. Pt only takes colace currently. MD notified and new order for 5ml milk of magnesia for pt. Will continue to monitor and assess. Baron Hamper, RN 11/03/2012

## 2012-11-04 ENCOUNTER — Inpatient Hospital Stay (HOSPITAL_COMMUNITY): Payer: Medicare Other

## 2012-11-04 DIAGNOSIS — N185 Chronic kidney disease, stage 5: Secondary | ICD-10-CM

## 2012-11-04 LAB — GLUCOSE, CAPILLARY
Glucose-Capillary: 122 mg/dL — ABNORMAL HIGH (ref 70–99)
Glucose-Capillary: 193 mg/dL — ABNORMAL HIGH (ref 70–99)
Glucose-Capillary: 185 mg/dL — ABNORMAL HIGH (ref 70–99)

## 2012-11-04 LAB — CBC
HCT: 30.9 % — ABNORMAL LOW (ref 39.0–52.0)
MCH: 30.3 pg (ref 26.0–34.0)
MCV: 90.9 fL (ref 78.0–100.0)
Platelets: 371 10*3/uL (ref 150–400)
RBC: 3.4 MIL/uL — ABNORMAL LOW (ref 4.22–5.81)
WBC: 10.5 10*3/uL (ref 4.0–10.5)

## 2012-11-04 LAB — RENAL FUNCTION PANEL
BUN: 75 mg/dL — ABNORMAL HIGH (ref 6–23)
CO2: 29 mEq/L (ref 19–32)
Calcium: 10.1 mg/dL (ref 8.4–10.5)
Chloride: 87 mEq/L — ABNORMAL LOW (ref 96–112)
Creatinine, Ser: 3.95 mg/dL — ABNORMAL HIGH (ref 0.50–1.35)
GFR calc non Af Amer: 14 mL/min — ABNORMAL LOW (ref >90)
Glucose, Bld: 279 mg/dL — ABNORMAL HIGH (ref 70–99)

## 2012-11-04 MED ORDER — SODIUM CHLORIDE 0.9 % IV SOLN: 100.0000 mL | INTRAVENOUS | Status: DC | PRN

## 2012-11-04 MED ORDER — LIDOCAINE HCL (PF) 1 % IJ SOLN: 5.0000 mL | INTRAMUSCULAR | Status: DC | PRN

## 2012-11-04 MED ORDER — NEPRO/CARBSTEADY PO LIQD
237.0000 mL | ORAL | Status: DC | PRN
  Filled 2012-11-04: qty 237

## 2012-11-04 MED ORDER — HEPARIN SODIUM (PORCINE) 1000 UNIT/ML DIALYSIS: 1000.0000 [IU] | INTRAMUSCULAR | Status: DC | PRN

## 2012-11-04 MED ORDER — FUROSEMIDE 80 MG PO TABS
80.0000 mg | ORAL_TABLET | Freq: Two times a day (BID) | ORAL | Status: DC
  Administered 2012-11-04 – 2012-11-08 (×8): 80 mg via ORAL
  Filled 2012-11-04 (×10): qty 1

## 2012-11-04 MED ORDER — PENTAFLUOROPROP-TETRAFLUOROETH EX AERO: 1.0000 "application " | INHALATION_SPRAY | CUTANEOUS | Status: DC | PRN

## 2012-11-04 MED ORDER — HEPARIN SODIUM (PORCINE) 1000 UNIT/ML DIALYSIS: 20.0000 [IU]/kg | INTRAMUSCULAR | Status: DC | PRN

## 2012-11-04 MED ORDER — LIDOCAINE-PRILOCAINE 2.5-2.5 % EX CREA
1.0000 "application " | TOPICAL_CREAM | CUTANEOUS | Status: DC | PRN
  Filled 2012-11-04: qty 5

## 2012-11-04 MED ORDER — ALTEPLASE 2 MG IJ SOLR
2.0000 mg | Freq: Once | INTRAMUSCULAR | Status: DC | PRN
  Filled 2012-11-04: qty 2

## 2012-11-04 NOTE — Progress Notes (Signed)
[facility] PROGRESS NOTE  DIANA ARMIJO ZOX:[id number] DOB: 1940-07-09 DOA: 10/30/2012 PCP: [name], MD  Assessment/Plan: 1-SOB: resolved. D/c abx.  2-CKD, now end stage:  Has tolerated 2 hemodialysis sessions. Home when an outpatient dialysis arranged.  3-DM: continue SSI  4-Gout: continue prednisone and change allopurinol to bid  5-hypothyroidism: continue synthroid  6-HTN: continue current meds  7-Anemia: improved  Loose stool: Sounds like a single episode. Will discontinue antibiotics and monitor.  Code Status: Full Family Communication: wife at bedside Disposition Plan: home when medically stable   Consultants:  Renal service  Procedures:  2-D echo pending  Antibiotics:  vanc and cefepime 5/25 -- 5/27  Doxycycline 5/27  HPI/Subjective: No cough, dyspnea.  Loose stool today x1  Objective: Filed Vitals:   11/04/12 1500 11/04/12 1530 11/04/12 1600 11/04/12 1605  BP: 115/65 100/69 98/63 121/69  Pulse: 69 70 75 73  Temp:    97.7 F (36.5 C)  TempSrc:    Oral  Resp:      Height:      Weight:    98.3 kg (216 lb 11.4 oz)  SpO2:    98%    Intake/Output Summary (Last 24 hours) at 11/04/12 1704 Last data filed at 11/04/12 1605  Gross per 24 hour  Intake    240 ml  Output   6689 ml  Net  -6449 ml   Filed Weights   11/04/12 0642 11/04/12 1138 11/04/12 1605  Weight: 103.103 kg (227 lb 4.8 oz) 103.2 kg (227 lb 8.2 oz) 98.3 kg (216 lb 11.4 oz)    Exam:   General:  NAD; comfortable  Cardiovascular: S1 and S2, no rubs or gallops  Respiratory: CTA without WRR  Abdomen: soft, NT, ND, positive BS  Musculoskeletal: trace edema bilaterally, no cyanosis  Psych:  Brighter today  Data Reviewed: Basic Metabolic Panel:  Recent Labs Lab 10/31/12 1300 11/01/12 0450 11/02/12 0525 11/03/12 0605 11/04/12 1159  NA 133* 134* 133* 136 131*  K 4.3 4.5 3.9 4.0 3.9  CL 94* 95* 92* 93* 87*  CO2 21 27 23 30 29   GLUCOSE 245* 124* 89  140* 279*  BUN 91* 96* 99* 66* 75*  CREATININE 4.70* 5.03* 5.03* 3.95* 3.95*  CALCIUM 9.0 8.7 9.6 9.6 10.1  PHOS  --  3.3 3.4 3.1 3.1   CBC:  Recent Labs Lab 10/30/12 0800 11/01/12 0450 11/02/12 0525 11/03/12 0655 11/04/12 1159  WBC 7.6 14.4* 12.8* 9.6 10.5  NEUTROABS  --   --   --  7.3  --   HGB 9.2* 7.6* 8.6* 9.2* 10.3*  HCT 28.2* 22.9* 26.3* 28.2* 30.9*  MCV 92.5 92.0 92.3 92.8 90.9  PLT 351 314 349 367 371   BNP (last 3 results)  Recent Labs  10/30/12 0030  PROBNP 17301.0*   CBG:  Recent Labs Lab 11/03/12 1112 11/03/12 1626 11/03/12 2105 11/04/12 0539 11/04/12 1628  GLUCAP 248* 274* 122* 193* 185*    Studies: Ir Fluoro Guide Cv Line Left  11/02/2012   *RADIOLOGY REPORT*  Indication: 72 year old with end-stage renal disease.  Recently placed right arm fistula.  The patient needs hemodialysis until the fistula mature.  PROCEDURE(S): FLUOROSCOPIC AND ULTRASOUND GUIDED PLACEMENT OF A TUNNELED DIAYSIS CATHETER  Physician:  Rachelle Hora. Henn, MD  Medications: Versed 2 mg, Fentanyl 100 mcg. A radiology nurse monitored the patient for moderate sedation.  As antibiotic prophylaxis, Ancef  was ordered pre-procedure and administered intravenously within one hour of incision.  Moderate sedation time:  43 minutes  Fluoroscopy time: 6 minutes and 36 seconds  Procedure:Informed consent was obtained for placement of a tunneled dialysis catheter.  The patient was placed supine on the interventional table.  The left side of the chest was selected due to the recently placed right arm fistula.  Ultrasound confirmed a patent left internal jugularvein.  Ultrasound images were obtained for documentation.  The left side of the neck was prepped and draped in a sterile fashion.  The left side of the neck was anesthetized with 1% lidocaine.  Maximal barrier sterile technique was utilized including caps, mask, sterile gowns, sterile gloves, sterile drape, hand hygiene and skin antiseptic.  A small  incision was made with #11 blade scalpel.  A 21 gauge needle directed into the left internal jugular vein with ultrasound guidance.  A micropuncture dilator set was placed. The wire would not easily advance into the SVC.  A Kumpe catheter and a Bentson wire used to cannulate the SVC.  A 27 cm tip to cuff HemoSplit catheter was selected.  The skin below the left clavicle was anesthetized and a small incision was made with an #11 blade scalpel.  A subcutaneous tunnel was formed to the vein dermatotomy site.  The catheter was brought through the tunnel.  The vein dermatotomy site was dilated to accommodate a peel-away sheath.  The catheter was placed through the peel-away sheath and directed into the central venous structures. The catheter preferentially went into the right innominate vein.  Catheter was pulled back and directed into the SVC with stiff Glidewires and stiff Amplatz wires.  Redirecting the catheter into the SVC was very tedious and difficult.  Eventually, the tip of the catheter was placed in the lower SVC.  Both lumens aspirated and flushed well.  Fluoroscopic images were obtained for documentation.   The proper amount of heparin was flushed in both lumens.  The vein dermatotomy site was closed using a single layer of absorbable suture and Dermabond.  The catheter was secured to the skin using Prolene suture.  Findings:Catheter tip in the superior vena cava.  Complications: None  Impression:Successful placement of a left jugular tunneled dialysis catheter using ultrasound and fluoroscopic guidance.   Original Report Authenticated By: Richarda Overlie, M.D.   Ir US Guide Vasc Access Left  11/02/2012   *RADIOLOGY REPORT*  Indication: 72 year old with end-stage renal disease.  Recently placed right arm fistula.  The patient needs hemodialysis until the fistula mature.  PROCEDURE(S): FLUOROSCOPIC AND ULTRASOUND GUIDED PLACEMENT OF A TUNNELED DIAYSIS CATHETER  Physician:  Rachelle Hora. Henn, MD  Medications: Versed 2  mg, Fentanyl 100 mcg. A radiology nurse monitored the patient for moderate sedation.  As antibiotic prophylaxis, Ancef  was ordered pre-procedure and administered intravenously within one hour of incision.  Moderate sedation time: 43 minutes  Fluoroscopy time: 6 minutes and 36 seconds  Procedure:Informed consent was obtained for placement of a tunneled dialysis catheter.  The patient was placed supine on the interventional table.  The left side of the chest was selected due to the recently placed right arm fistula.  Ultrasound confirmed a patent left internal jugularvein.  Ultrasound images were obtained for documentation.  The left side of the neck was prepped and draped in a sterile fashion.  The left side of the neck was anesthetized with 1% lidocaine.  Maximal barrier sterile technique was utilized including caps, mask, sterile gowns, sterile gloves, sterile drape, hand hygiene and skin antiseptic.  A small incision was made with #11 blade scalpel.  A 21 gauge needle directed into the left internal jugular vein with ultrasound guidance.  A micropuncture dilator set was placed. The wire would not easily advance into the SVC.  A Kumpe catheter and a Bentson wire used to cannulate the SVC.  A 27 cm tip to cuff HemoSplit catheter was selected.  The skin below the left clavicle was anesthetized and a small incision was made with an #11 blade scalpel.  A subcutaneous tunnel was formed to the vein dermatotomy site.  The catheter was brought through the tunnel.  The vein dermatotomy site was dilated to accommodate a peel-away sheath.  The catheter was placed through the peel-away sheath and directed into the central venous structures. The catheter preferentially went into the right innominate vein.  Catheter was pulled back and directed into the SVC with stiff Glidewires and stiff Amplatz wires.  Redirecting the catheter into the SVC was very tedious and difficult.  Eventually, the tip of the catheter was placed in the  lower SVC.  Both lumens aspirated and flushed well.  Fluoroscopic images were obtained for documentation.   The proper amount of heparin was flushed in both lumens.  The vein dermatotomy site was closed using a single layer of absorbable suture and Dermabond.  The catheter was secured to the skin using Prolene suture.  Findings:Catheter tip in the superior vena cava.  Complications: None  Impression:Successful placement of a left jugular tunneled dialysis catheter using ultrasound and fluoroscopic guidance.   Original Report Authenticated By: Richarda Overlie, M.D.    Scheduled Meds: . allopurinol  100 mg Oral BID  . aspirin  325 mg Oral Daily  . calcium-vitamin D  1 tablet Oral BID  . darbepoetin (ARANESP) injection - NON-DIALYSIS  100 mcg Subcutaneous Q Wed-1800  . ferrous sulfate  325 mg Oral QAC supper  . furosemide  80 mg Oral BID  . heparin  5,000 Units Subcutaneous Q8H  . insulin aspart  0-9 Units Subcutaneous TID WC  . levothyroxine  112 mcg Oral Daily  . metoprolol  25 mg Oral BID  . predniSONE  1 mg Oral BID WC  . sodium chloride  3 mL Intravenous Q12H   Continuous Infusions:   Time spent: 15 minutes  Sheryl Towell L  [facility] Pager [phone]. If 7PM-7AM, please contact night-coverage at [email or web address], password [facility] 11/04/2012, 5:04 PM  LOS: 5 days

## 2012-11-04 NOTE — Progress Notes (Signed)
Bed available on 6700 at 2300 for transfer pt. Jack Chung no happy about been transfer at that time to other unit, he refers he is going to be dc home tomorrow after HD. Dr. Burnadette Peter notified, MD ok for pt to stay on 4700 at this time.

## 2012-11-04 NOTE — Progress Notes (Addendum)
[facility]  Subjective:  Feels better, less coughing   Objective: Vital signs in last 24 hours: Blood pressure 115/53, pulse 72, temperature 98.1 F (36.7 C), temperature source Oral, resp. rate 18, height 5\' 10"  (1.778 m), weight 103.103 kg (227 lb 4.8 oz), SpO2 97.00%.    PHYSICAL EXAM General-awake, alert, not wearing 02 today  Chest-L IJ Tunneled dialysis Cath in place,clear to ausc today Heart-no rub  Abd-nontender  Extr-AVF patent R upper arm, trace pretib edema   Lab Results:   Recent Labs Lab 11/01/12 0450 11/02/12 0525 11/03/12 0605  NA 134* 133* 136  K 4.5 3.9 4.0  CL 95* 92* 93*  CO2 27 23 30   BUN 96* 99* 66*  CREATININE 5.03* 5.03* 3.95*  GLUCOSE 124* 89 140*  CALCIUM 8.7 9.6 9.6  PHOS 3.3 3.4 3.1     Recent Labs  11/02/12 0525 11/03/12 0655  WBC 12.8* 9.6  HGB 8.6* 9.2*  HCT 26.3* 28.2*  PLT 349 367     I have reviewed the patient's current medications. Scheduled: . allopurinol  100 mg Oral BID  . aspirin  325 mg Oral Daily  . calcium-vitamin D  1 tablet Oral BID  . darbepoetin (ARANESP) injection - NON-DIALYSIS  100 mcg Subcutaneous Q Wed-1800  . doxycycline  100 mg Oral Q12H  . ferrous sulfate  325 mg Oral QAC supper  . furosemide  160 mg Intravenous Q6H  . heparin  5,000 Units Subcutaneous Q8H  . insulin aspart  0-9 Units Subcutaneous TID WC  . levothyroxine  112 mcg Oral Daily  . magnesium oxide  400 mg Oral Daily  . metoprolol  25 mg Oral BID  . predniSONE  1 mg Oral BID WC  . sodium chloride  3 mL Intravenous Q12H    Assessment/Plan: 1. 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 glucose intolerance/recent hypoglycemia--follow CBGs, prednisone                 is now back to 1 mg twice a day   C. probable nephropathy- urine protein/creatinine ratio 0.5  2. 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 PTH 82. He is interested in peritoneal dialysis. PT 14 sec, INR 1.1. Will plan to PD catheter once breathing better. Dialyze  today to weight of 98 kg. Then MWF 4. CHF-- change furosemide to 80 po BID. 2-D echo done 10/05/2012 showed EF 55-60% and no WMA.  Check CXR post HD 5. Past history sarcoidosis--wife says he has not been treated for this [phone].  6. Gout--currently on allopurinol 100/D. and was on prednisone 1 MG twice a day PTA. Uric acid 7.9 on 26 May.  Dr. Lendell Caprice has increased allopurinol to 100 BID  7. Anemia--On feraheme (IV Fe) and aranesp. Hgb 9.2 today  8. Hypothyroidism--continue with thyroid replacement.      LOS: 5 days   Camey Edell F 11/04/2012,10:33 AM   .labalb

## 2012-11-05 DIAGNOSIS — I5022 Chronic systolic (congestive) heart failure: Secondary | ICD-10-CM

## 2012-11-05 DIAGNOSIS — N184 Chronic kidney disease, stage 4 (severe): Secondary | ICD-10-CM

## 2012-11-05 LAB — GLUCOSE, CAPILLARY
Glucose-Capillary: 260 mg/dL — ABNORMAL HIGH (ref 70–99)
Glucose-Capillary: 338 mg/dL — ABNORMAL HIGH (ref 70–99)

## 2012-11-05 MED ORDER — INSULIN GLARGINE 100 UNIT/ML ~~LOC~~ SOLN
30.0000 [IU] | Freq: Every day | SUBCUTANEOUS | Status: DC
  Administered 2012-11-06 – 2012-11-08 (×3): 30 [IU] via SUBCUTANEOUS
  Filled 2012-11-05 (×3): qty 0.3

## 2012-11-05 NOTE — Progress Notes (Signed)
[facility]  Subjective:  No coughing, breathing easier, wonders whether he can get PD catheter during this admission.  Still working on outpatient dialysis center placement   Objective: Vital signs in last 24 hours: Blood pressure 100/66, pulse 69, temperature 97.9 Chung (36.6 C), temperature source Oral, resp. rate 20, height 5\' 10"  (1.778 m), weight 98.2 kg (216 lb 7.9 oz), SpO2 98.00%.    PHYSICAL EXAM General-awake, alert, not wearing 02 today  Chest-L IJ Tunneled dialysis Cath in place,clear to ausc today  Heart-no rub  Abd-nontender  Extr-AVF patent R upper arm, trace pretib edema   Lab Results:   Recent Labs Lab 11/02/12 0525 11/03/12 0605 11/04/12 1159  NA 133* 136 131*  K 3.9 4.0 3.9  CL 92* 93* 87*  CO2 23 30 29   BUN 99* 66* 75*  CREATININE 5.03* 3.95* 3.95*  GLUCOSE 89 140* 279*  CALCIUM 9.6 9.6 10.1  PHOS 3.4 3.1 3.1     Recent Labs  11/03/12 0655 11/04/12 1159  WBC 9.6 10.5  HGB 9.2* 10.3*  HCT 28.2* 30.9*  PLT 367 371     I have reviewed the patient's current medications. Scheduled: . allopurinol  100 mg Oral BID  . aspirin  325 mg Oral Daily  . calcium-vitamin D  1 tablet Oral BID  . darbepoetin (ARANESP) injection - NON-DIALYSIS  100 mcg Subcutaneous Q Wed-1800  . ferrous sulfate  325 mg Oral QAC supper  . furosemide  80 mg Oral BID  . heparin  5,000 Units Subcutaneous Q8H  . insulin aspart  0-9 Units Subcutaneous TID WC  . [START ON 11/06/2012] insulin glargine  30 Units Subcutaneous Daily  . levothyroxine  112 mcg Oral Daily  . metoprolol  25 mg Oral BID  . predniSONE  1 mg Oral BID WC  . sodium chloride  3 mL Intravenous Q12H   Assessment/Plan: 1. 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 glucose intolerance/recent hypoglycemia--follow CBGs, prednisone is now back to 1 mg twice a day   C. probable nephropathy- urine protein/creatinine ratio 0.5  2. 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 PTH 82. He is interested in peritoneal dialysis. PT 14 sec, INR 1.1. Will plan to PD catheter this week--have not called [facility] yet.. Dialyze Monday to weight of 98 kg.   4. CHF--  furosemide  80 po BID--still makes urine. 2-D echo done 10/05/2012 showed EF 55-60% and no WMA. CXR post HD--infiltrates are gone (off antibiotics)  5. Past history sarcoidosis--wife says he has not been treated for this [phone].  6. Gout--currently on allopurinol 100/D. and was on prednisone 1 MG twice a day PTA. Uric acid 7.9 on 26 May. Dr. Lendell Caprice has increased allopurinol to 100 BID  7. Anemia--On feraheme (IV Fe) and aranesp. Hgb 10.5 yesterday  8. Hypothyroidism--continue with thyroid replacement.      LOS: 6 days   Jack Chung 11/05/2012,2:30 PM   .labalb

## 2012-11-05 NOTE — Progress Notes (Signed)
[facility] PROGRESS NOTE  Jack Chung ZOX:[id number] DOB: 04-13-1941 DOA: 10/30/2012 PCP: [name], MD  Assessment/Plan: Diastolic CHF: on lasix  2-CKD 4:  Has tolerated 2 hemodialysis sessions. Home when an outpatient dialysis arranged.  3-DM: continue SSI. Resume Lantus.  4-Gout: continue prednisone and change allopurinol to bid  5-hypothyroidism: continue synthroid  6-HTN: continue current meds  7-Anemia: improved  No further loose stool.  Code Status: Full Family Communication: wife at bedside Disposition Plan: home when medically stable   Consultants:  Renal service  Procedures:  2-D echo pending  Antibiotics:  vanc and cefepime 5/25 -- 5/27  Doxycycline 5/27  HPI/Subjective: Feels great.  Objective: Filed Vitals:   11/04/12 1700 11/04/12 2135 11/05/12 0456 11/05/12 1314  BP: 140/118 107/67 95/59 100/66  Pulse: 80 75 73 69  Temp: 97.9 F (36.6 C) 97.7 F (36.5 C) 97.6 F (36.4 C) 97.9 F (36.6 C)  TempSrc: Oral Oral Oral Oral  Resp: 20 20 20 20   Height:      Weight:   98.2 kg (216 lb 7.9 oz)   SpO2: 100% 99% 95% 98%    Intake/Output Summary (Last 24 hours) at 11/05/12 1831 Last data filed at 11/05/12 0800  Gross per 24 hour  Intake    558 ml  Output    250 ml  Net    308 ml   Filed Weights   11/04/12 1138 11/04/12 1605 11/05/12 0456  Weight: 103.2 kg (227 lb 8.2 oz) 98.3 kg (216 lb 11.4 oz) 98.2 kg (216 lb 7.9 oz)    Exam:   General:  NAD; comfortable  Cardiovascular: S1 and S2, no rubs or gallops  Respiratory: CTA without WRR  Abdomen: soft, NT, ND, positive BS  Musculoskeletal: trace edema bilaterally, no cyanosis  Data Reviewed: Basic Metabolic Panel:  Recent Labs Lab 10/31/12 1300 11/01/12 0450 11/02/12 0525 11/03/12 0605 11/04/12 1159  NA 133* 134* 133* 136 131*  K 4.3 4.5 3.9 4.0 3.9  CL 94* 95* 92* 93* 87*  CO2 21 27 23 30 29   GLUCOSE 245* 124* 89 140* 279*  BUN 91* 96* 99* 66* 75*   CREATININE 4.70* 5.03* 5.03* 3.95* 3.95*  CALCIUM 9.0 8.7 9.6 9.6 10.1  PHOS  --  3.3 3.4 3.1 3.1   CBC:  Recent Labs Lab 10/30/12 0800 11/01/12 0450 11/02/12 0525 11/03/12 0655 11/04/12 1159  WBC 7.6 14.4* 12.8* 9.6 10.5  NEUTROABS  --   --   --  7.3  --   HGB 9.2* 7.6* 8.6* 9.2* 10.3*  HCT 28.2* 22.9* 26.3* 28.2* 30.9*  MCV 92.5 92.0 92.3 92.8 90.9  PLT 351 314 349 367 371   BNP (last 3 results)  Recent Labs  10/30/12 0030  PROBNP 17301.0*   CBG:  Recent Labs Lab 11/04/12 2131 11/05/12 0535 11/05/12 0623 11/05/12 1112 11/05/12 1609  GLUCAP 367* 258* 260* 338* 236*    Studies: Dg Chest 2 View  11/05/2012   *RADIOLOGY REPORT*  Clinical Data: Shortness of breath.  Sarcoidosis. Chronic renal failure.  Status post dialysis.  CHEST - 2 VIEW  Comparison: 10/31/2012  Findings: There has been resolution of bilateral perihilar infiltrates since previous study, consistent with resolving pulmonary edema.  No acute infiltrates are seen.  Mild scarring noted at the left lung base.  Heart size is within normal limits. The left internal jugular dual lumen center venous catheter is seen in appropriate position.  No pneumothorax identified.  IMPRESSION:  1.  Resolution of  bilateral perihilar infiltrates/edema since prior exam. 2.  New left jugular dialysis catheter in appropriate position.  No pneumothorax identified.   Original Report Authenticated By: [name], M.D.    Scheduled Meds: . allopurinol  100 mg Oral BID  . aspirin  325 mg Oral Daily  . calcium-vitamin D  1 tablet Oral BID  . darbepoetin (ARANESP) injection - NON-DIALYSIS  100 mcg Subcutaneous Q Wed-1800  . ferrous sulfate  325 mg Oral QAC supper  . furosemide  80 mg Oral BID  . heparin  5,000 Units Subcutaneous Q8H  . insulin aspart  0-9 Units Subcutaneous TID WC  . [START ON 11/06/2012] insulin glargine  30 Units Subcutaneous Daily  . levothyroxine  112 mcg Oral Daily  . metoprolol  25 mg Oral BID  . predniSONE   1 mg Oral BID WC  . sodium chloride  3 mL Intravenous Q12H   Continuous Infusions:   Time spent: 15 minutes  Karlisha Mathena L  [facility] Pager [phone]. If 7PM-7AM, please contact night-coverage at [email or web address], password [facility] 11/05/2012, 6:31 PM  LOS: 6 days

## 2012-11-06 ENCOUNTER — Encounter: Payer: Self-pay | Admitting: Vascular Surgery

## 2012-11-06 DIAGNOSIS — N186 End stage renal disease: Secondary | ICD-10-CM

## 2012-11-06 LAB — GLUCOSE, CAPILLARY: Glucose-Capillary: 264 mg/dL — ABNORMAL HIGH (ref 70–99)

## 2012-11-06 MED ORDER — INSULIN ASPART 100 UNIT/ML ~~LOC~~ SOLN
4.0000 [IU] | Freq: Three times a day (TID) | SUBCUTANEOUS | Status: DC
  Administered 2012-11-06 – 2012-11-08 (×5): 4 [IU] via SUBCUTANEOUS

## 2012-11-06 MED ORDER — INSULIN ASPART 100 UNIT/ML ~~LOC~~ SOLN
0.0000 [IU] | Freq: Every day | SUBCUTANEOUS | Status: DC
  Administered 2012-11-06: 3 [IU] via SUBCUTANEOUS

## 2012-11-06 NOTE — Progress Notes (Signed)
IV site due to be change, unable to start a new IV after 3 times. IV team to be notified.

## 2012-11-06 NOTE — Progress Notes (Signed)
Patient ID: Jack Chung, male   DOB: 1941/02/01, 72 y.o.   MRN: [id number] S:no new complaints O:BP 122/70  Pulse 72  Temp(Src) 97.9 F (36.6 C) (Oral)  Resp 18  Ht 5\' 10"  (1.778 m)  Wt 99.6 kg (219 lb 9.3 oz)  BMI 31.51 kg/m2  SpO2 100%  Intake/Output Summary (Last 24 hours) at 11/06/12 1248 Last data filed at 11/06/12 1017  Gross per 24 hour  Intake    483 ml  Output    200 ml  Net    283 ml   Intake/Output: I/O last 3 completed shifts: In: 798 [P.O.:798] Out: 250 [Urine:250]  Intake/Output this shift:  Total I/O In: 243 [P.O.:240; I.V.:3] Out: 200 [Urine:200] Weight change: -3.6 kg (-7 lb 15 oz) Gen:WD WN WM in NAD CVS:no rub Resp:cta WUJ:WJXBJY Ext:no edema, [name] AVF +T/B   Recent Labs Lab 10/31/12 1300 11/01/12 0450 11/02/12 0525 11/03/12 0605 11/04/12 1159  NA 133* 134* 133* 136 131*  K 4.3 4.5 3.9 4.0 3.9  CL 94* 95* 92* 93* 87*  CO2 21 27 23 30 29   GLUCOSE 245* 124* 89 140* 279*  BUN 91* 96* 99* 66* 75*  CREATININE 4.70* 5.03* 5.03* 3.95* 3.95*  ALBUMIN  --  2.5* 3.1* 3.1* 3.4*  CALCIUM 9.0 8.7 9.6 9.6 10.1  PHOS  --  3.3 3.4 3.1 3.1   Liver Function Tests:  Recent Labs Lab 11/02/12 0525 11/03/12 0605 11/04/12 1159  ALBUMIN 3.1* 3.1* 3.4*   No results found for this basename: LIPASE, AMYLASE,  in the last 168 hours No results found for this basename: AMMONIA,  in the last 168 hours CBC:  Recent Labs Lab 11/01/12 0450 11/02/12 0525 11/03/12 0655 11/04/12 1159  WBC 14.4* 12.8* 9.6 10.5  NEUTROABS  --   --  7.3  --   HGB 7.6* 8.6* 9.2* 10.3*  HCT 22.9* 26.3* 28.2* 30.9*  MCV 92.0 92.3 92.8 90.9  PLT 314 349 367 371   Cardiac Enzymes: No results found for this basename: CKTOTAL, CKMB, CKMBINDEX, TROPONINI,  in the last 168 hours CBG:  Recent Labs Lab 11/05/12 0623 11/05/12 1112 11/05/12 1609 11/06/12 0550 11/06/12 1134  GLUCAP 260* 338* 236* 304* 337*    Iron Studies: No results found for this basename: IRON, TIBC,  TRANSFERRIN, FERRITIN,  in the last 72 hours Studies/Results: Dg Chest 2 View  11/05/2012   *RADIOLOGY REPORT*  Clinical Data: Shortness of breath.  Sarcoidosis. Chronic renal failure.  Status post dialysis.  CHEST - 2 VIEW  Comparison: 10/31/2012  Findings: There has been resolution of bilateral perihilar infiltrates since previous study, consistent with resolving pulmonary edema.  No acute infiltrates are seen.  Mild scarring noted at the left lung base.  Heart size is within normal limits. The left internal jugular dual lumen center venous catheter is seen in appropriate position.  No pneumothorax identified.  IMPRESSION:  1.  Resolution of bilateral perihilar infiltrates/edema since prior exam. 2.  New left jugular dialysis catheter in appropriate position.  No pneumothorax identified.   Original Report Authenticated By: [name], M.D.   . allopurinol  100 mg Oral BID  . aspirin  325 mg Oral Daily  . calcium-vitamin D  1 tablet Oral BID  . darbepoetin (ARANESP) injection - NON-DIALYSIS  100 mcg Subcutaneous Q Wed-1800  . ferrous sulfate  325 mg Oral QAC supper  . furosemide  80 mg Oral BID  . heparin  5,000 Units Subcutaneous Q8H  . insulin aspart  0-9 Units Subcutaneous TID WC  . insulin glargine  30 Units Subcutaneous Daily  . levothyroxine  112 mcg Oral Daily  . metoprolol  25 mg Oral BID  . predniSONE  1 mg Oral BID WC  . sodium chloride  3 mL Intravenous Q12H    BMET    Component Value Date/Time   NA 131* 11/04/2012 1159   K 3.9 11/04/2012 1159   CL 87* 11/04/2012 1159   CO2 29 11/04/2012 1159   GLUCOSE 279* 11/04/2012 1159   BUN 75* 11/04/2012 1159   CREATININE 3.95* 11/04/2012 1159   CALCIUM 10.1 11/04/2012 1159   GFRNONAA 14* 11/04/2012 1159   GFRAA 16* 11/04/2012 1159   CBC    Component Value Date/Time   WBC 10.5 11/04/2012 1159   RBC 3.40* 11/04/2012 1159   HGB 10.3* 11/04/2012 1159   HCT 30.9* 11/04/2012 1159   PLT 371 11/04/2012 1159   MCV 90.9 11/04/2012 1159   MCH 30.3  11/04/2012 1159   MCHC 33.3 11/04/2012 1159   RDW 14.0 11/04/2012 1159   LYMPHSABS 0.8 11/03/2012 0655   MONOABS 1.0 11/03/2012 0655   EOSABS 0.5 11/03/2012 0655   BASOSABS 0.1 11/03/2012 0655     Assessment/Plan:  1. New ESRD- plan for HD again today and have sent info for outpt HD.  Was also interested in PD, however this can be arranged as an outpt as a home visit for suitability has not been performed.  Awaiting outpt spot prior to discharge 2. Acute on chronic diastolic CHF- improved with HD 3. DM- per primary svc 4. 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 HTN- stable 6. Vascular access- [name] AVF placed 10/11/12 by Dr. Arbie Cookey.  Now has tunneled [facility] HD cath and will require PD catheter once evaluated by Surgery.  7. Sarcoidosis- stable 8. OSA- stable  Dontrae Morini A

## 2012-11-06 NOTE — Progress Notes (Signed)
[facility] PROGRESS NOTE  Jack Chung [name]:[id number] DOB: 11-Jun-1940 DOA: 10/30/2012 PCP: [name], MD  Assessment/Plan: Diastolic CHF: on lasix  2-CKD 4:  Has tolerated 2 hemodialysis sessions. Home when an outpatient dialysis arranged.  3-DM: continue SSI. Resume Lantus.  4-Gout: continue prednisone and change allopurinol to bid  5-hypothyroidism: continue synthroid  6-HTN: continue current meds  7-Anemia: improved  Stable. Discussed with Dr. Arrie Chung  Code Status: Full Family Communication: wife at bedside Disposition Plan: home when medically stable   Consultants:  Renal service  Procedures:  2-D echo pending  Antibiotics:  vanc and cefepime 5/25 -- 5/27  Doxycycline 5/27  HPI/Subjective: Feels great.  Objective: Filed Vitals:   11/05/12 2108 11/06/12 0546 11/06/12 1007 11/06/12 1351  BP: 99/68 93/62 122/70 92/57  Pulse: 78 92 72 69  Temp: 98 F (36.7 C) 98.5 F (36.9 C) 97.9 F (36.6 C)   TempSrc: Oral Oral Oral   Resp: 20 20 18 20   Height:      Weight:  99.6 kg (219 lb 9.3 oz)    SpO2: 97% 96% 100% 100%    Intake/Output Summary (Last 24 hours) at 11/06/12 1621 Last data filed at 11/06/12 1350  Gross per 24 hour  Intake    843 ml  Output    200 ml  Net    643 ml   Filed Weights   11/04/12 1605 11/05/12 0456 11/06/12 0546  Weight: 98.3 kg (216 lb 11.4 oz) 98.2 kg (216 lb 7.9 oz) 99.6 kg (219 lb 9.3 oz)    Exam:   General:  NAD; comfortable  Cardiovascular: S1 and S2, no rubs or gallops  Respiratory: CTA without WRR  Abdomen: soft, NT, ND, positive BS  Musculoskeletal: trace edema bilaterally, no cyanosis  Data Reviewed: Basic Metabolic Panel:  Recent Labs Lab 10/31/12 1300 11/01/12 0450 11/02/12 0525 11/03/12 0605 11/04/12 1159  NA 133* 134* 133* 136 131*  K 4.3 4.5 3.9 4.0 3.9  CL 94* 95* 92* 93* 87*  CO2 21 27 23 30 29   GLUCOSE 245* 124* 89 140* 279*  BUN 91* 96* 99* 66* 75*   CREATININE 4.70* 5.03* 5.03* 3.95* 3.95*  CALCIUM 9.0 8.7 9.6 9.6 10.1  PHOS  --  3.3 3.4 3.1 3.1   CBC:  Recent Labs Lab 11/01/12 0450 11/02/12 0525 11/03/12 0655 11/04/12 1159  WBC 14.4* 12.8* 9.6 10.5  NEUTROABS  --   --  7.3  --   HGB 7.6* 8.6* 9.2* 10.3*  HCT 22.9* 26.3* 28.2* 30.9*  MCV 92.0 92.3 92.8 90.9  PLT 314 349 367 371   BNP (last 3 results)  Recent Labs  10/30/12 0030  PROBNP 17301.0*   CBG:  Recent Labs Lab 11/05/12 0623 11/05/12 1112 11/05/12 1609 11/06/12 0550 11/06/12 1134  GLUCAP 260* 338* 236* 304* 337*    Studies: Dg Chest 2 View  11/05/2012   *RADIOLOGY REPORT*  Clinical Data: Shortness of breath.  Sarcoidosis. Chronic renal failure.  Status post dialysis.  CHEST - 2 VIEW  Comparison: 10/31/2012  Findings: There has been resolution of bilateral perihilar infiltrates since previous study, consistent with resolving pulmonary edema.  No acute infiltrates are seen.  Mild scarring noted at the left lung base.  Heart size is within normal limits. The left internal jugular dual lumen center venous catheter is seen in appropriate position.  No pneumothorax identified.  IMPRESSION:  1.  Resolution of bilateral perihilar infiltrates/edema since prior exam. 2.  New left jugular dialysis  catheter in appropriate position.  No pneumothorax identified.   Original Report Authenticated By: Jack Chung, M.D.    Scheduled Meds: . allopurinol  100 mg Oral BID  . aspirin  325 mg Oral Daily  . calcium-vitamin D  1 tablet Oral BID  . darbepoetin (ARANESP) injection - NON-DIALYSIS  100 mcg Subcutaneous Q Wed-1800  . ferrous sulfate  325 mg Oral QAC supper  . furosemide  80 mg Oral BID  . heparin  5,000 Units Subcutaneous Q8H  . insulin aspart  0-9 Units Subcutaneous TID WC  . insulin glargine  30 Units Subcutaneous Daily  . levothyroxine  112 mcg Oral Daily  . metoprolol  25 mg Oral BID  . predniSONE  1 mg Oral BID WC  . sodium chloride  3 mL Intravenous Q12H    Continuous Infusions:   Time spent: 15 minutes  [name] L  [facility] Pager [phone]. If 7PM-7AM, please contact night-coverage at [email or web address], password [facility] 11/06/2012, 4:21 PM  LOS: 7 days

## 2012-11-07 ENCOUNTER — Telehealth (INDEPENDENT_AMBULATORY_CARE_PROVIDER_SITE_OTHER): Payer: Self-pay | Admitting: General Surgery

## 2012-11-07 ENCOUNTER — Ambulatory Visit: Payer: Medicare Other | Admitting: Vascular Surgery

## 2012-11-07 DIAGNOSIS — I5032 Chronic diastolic (congestive) heart failure: Secondary | ICD-10-CM

## 2012-11-07 LAB — GLUCOSE, CAPILLARY
Glucose-Capillary: 166 mg/dL — ABNORMAL HIGH (ref 70–99)
Glucose-Capillary: 194 mg/dL — ABNORMAL HIGH (ref 70–99)

## 2012-11-07 MED ORDER — METOPROLOL TARTRATE 25 MG PO TABS: 25.0000 mg | ORAL_TABLET | Freq: Two times a day (BID) | ORAL | Status: DC

## 2012-11-07 MED ORDER — HEPARIN SODIUM (PORCINE) 1000 UNIT/ML DIALYSIS: 1000.0000 [IU] | INTRAMUSCULAR | Status: DC | PRN

## 2012-11-07 MED ORDER — HEPARIN SODIUM (PORCINE) 1000 UNIT/ML DIALYSIS: 20.0000 [IU]/kg | INTRAMUSCULAR | Status: DC | PRN

## 2012-11-07 MED ORDER — LIDOCAINE HCL (PF) 1 % IJ SOLN: 5.0000 mL | INTRAMUSCULAR | Status: DC | PRN

## 2012-11-07 MED ORDER — ALLOPURINOL 100 MG PO TABS: 100.0000 mg | ORAL_TABLET | Freq: Two times a day (BID) | ORAL | Status: DC

## 2012-11-07 MED ORDER — LIDOCAINE-PRILOCAINE 2.5-2.5 % EX CREA: 1.0000 "application " | TOPICAL_CREAM | CUTANEOUS | Status: DC | PRN

## 2012-11-07 MED ORDER — SODIUM CHLORIDE 0.9 % IV SOLN: 100.0000 mL | INTRAVENOUS | Status: DC | PRN

## 2012-11-07 MED ORDER — ALTEPLASE 2 MG IJ SOLR
2.0000 mg | Freq: Once | INTRAMUSCULAR | Status: DC | PRN
  Filled 2012-11-07: qty 2

## 2012-11-07 MED ORDER — PENTAFLUOROPROP-TETRAFLUOROETH EX AERO: 1.0000 "application " | INHALATION_SPRAY | CUTANEOUS | Status: DC | PRN

## 2012-11-07 MED ORDER — ALTEPLASE 2 MG IJ SOLR
5.0000 mg | Freq: Once | INTRAMUSCULAR | Status: AC
  Administered 2012-11-07: 5 mg
  Filled 2012-11-07: qty 6

## 2012-11-07 MED ORDER — NEPRO/CARBSTEADY PO LIQD: 237.0000 mL | ORAL | Status: DC | PRN

## 2012-11-07 NOTE — Progress Notes (Signed)
Patient has just been transported to Dialysis.  Lorretta Harp RN

## 2012-11-07 NOTE — Consult Note (Signed)
To see Derrell Lolling.

## 2012-11-07 NOTE — Procedures (Signed)
Patient was seen on dialysis and the procedure was supervised. BFR 300 Via [facility] PC BP is 120/64.  Patient appears to be tolerating treatment well

## 2012-11-07 NOTE — Discharge Summary (Signed)
Physician Discharge Summary  Jack Chung JYN:[id number] DOB: 05/05/1941 DOA: 10/30/2012  PCP: [name], MD  Admit date: 10/30/2012 Discharge date: 11/07/2012  Time spent: 40 min   Discharge Diagnoses:     HCAP (healthcare-associated pneumonia), treated    Anemia   CKD (chronic kidney disease), now ESRD    Type II or unspecified type diabetes mellitus with renal manifestations, uncontrolled(250.42)    CHF (congestive heart failure), chronic diastolic    Sarcoidosis    Sleep apnea   Discharge Condition: stable  Diet recommendation: renal  Filed Weights   11/05/12 0456 11/06/12 0546 11/07/12 0522  Weight: 98.2 kg (216 lb 7.9 oz) 99.6 kg (219 lb 9.3 oz) 100.789 kg (222 lb 3.2 oz)    History of present illness:   Jack Chung is an 72 y.o. male with hx of CKD, CHF, sleep apnea, sarcoidosis on chronic steroid, prior dialysis, recent AVF placed on right forearm, presents to the ER with 3 days hx of increase shortness of breath, orthopnea, yellow sputum productive cough, but no chest pain. Evaluation in the ER included a CXR which showed ? Multifocal PNA, no vascular congestion, a BNP of 17,000, BUN 83, Cr 4.5. He has no leukocytosis, and Hb is 10 g/DL. He was given IV Lasix of 80mg , and started on Van/Cefepime. Hospitalist was asked to admit him for HCAP and volume overload.  Hospital Course:  Started on iv lasix, antibiotics.  Failed to diurese. Started on dialysis and will continue hemodialysis as outpatient.  Cough dispnea has improved.  Wishes to eventually convert to peritoneal dialysis. Awaiting arrangement of outpatient dialysis, then can be discharged.    Procedures:  permcath  Consultations:  Nephrology  IR  Discharge Exam: Filed Vitals:   11/06/12 1351 11/06/12 2100 11/07/12 0522 11/07/12 0959  BP: 92/57 112/63 111/61 117/54  Pulse: 69 71 82 66  Temp:  97.9 F (36.6 C) 98 F (36.7 C) 97.4 F (36.3 C)  TempSrc:  Oral Oral Oral  Resp: 20 18 18  18   Height:      Weight:   100.789 kg (222 lb 3.2 oz)   SpO2: 100% 98% 95% 99%    General: comfotable Cardiovascular: RRR without MGR Respiratory: RRR without WRR  Discharge Instructions  Discharge Orders   Future Appointments Provider Department Dept Phone   11/07/2012 3:45 PM Larina Earthly, MD [facility] [phone]   11/09/2012 3:15 PM Chung Pauls, PT [facility] [phone]   11/15/2012 2:45 PM Sallyanne Kuster, PTA [facility] [phone]   11/17/2012 2:45 PM Sallyanne Kuster, PTA [facility] [phone]   11/20/2012 9:00 AM [facility] [phone]   NPO after midnight, and off all stomach meds 24 hrs prior to exam   12/07/2012 1:30 PM Kermit Balo, DO [facility] [phone]   Future Orders Complete By Expires     Activity as tolerated - No restrictions  As directed     Diet - low sodium heart healthy  As directed         Medication List    STOP taking these medications       amLODipine 10 MG tablet  Commonly known as:  NORVASC     magnesium oxide 400 MG tablet  Commonly known as:  MAG-OX      TAKE these medications       allopurinol 100 MG tablet  Commonly known as:  ZYLOPRIM  Take 1 tablet (100 mg total) by mouth 2 (two) times daily.     aspirin 325 MG tablet  Take 325 mg by mouth daily.     CALCIUM + D PO  Take 1 tablet by mouth 2 (two) times daily.     Cranberry 300 MG tablet  Take 300 mg by mouth 2 (two) times daily.     ferrous sulfate 325 (65 FE) MG tablet  Take 325 mg by mouth daily before supper.     furosemide 40 MG tablet  Commonly known as:  LASIX  Take 80 mg by mouth 2 (two) times daily.     insulin aspart 100 UNIT/ML injection  Commonly known as:  novoLOG  Inject 2-8 Units into the skin 3 (three) times daily with meals. Take none if  blood sugar < 150, 151-200 2 units, 201-250 4 units, 251-300 6 units, 301-350 8 units     insulin glargine 100 UNIT/ML injection  Commonly known as:  LANTUS  Inject 0.3 mLs (30 Units total) into the skin every morning.     levothyroxine 112 MCG tablet  Commonly known as:  SYNTHROID, LEVOTHROID  Take 112 mcg by mouth daily.     metoprolol tartrate 25 MG tablet  Commonly known as:  LOPRESSOR  Take 1 tablet (25 mg total) by mouth 2 (two) times daily.     oxyCODONE 5 MG immediate release tablet  Commonly known as:  ROXICODONE  Take 1 tablet (5 mg total) by mouth every 4 (four) hours as needed for pain.     predniSONE 1 MG tablet  Commonly known as:  DELTASONE  Take 1 mg by mouth 2 (two) times daily.       Allergies  Allergen Reactions  . Flu Virus Vaccine     Gets the flu  . Lipitor (Atorvastatin)     Leg cramps      The results of significant diagnostics from this hospitalization (including imaging, microbiology, ancillary and laboratory) are listed below for reference.    Significant Diagnostic Studies: Dg Chest 2 View  11/05/2012   *RADIOLOGY REPORT*  Clinical Data: Shortness of breath.  Sarcoidosis. Chronic renal failure.  Status post dialysis.  CHEST - 2 VIEW  Comparison: 10/31/2012  Findings: There has been resolution of bilateral perihilar infiltrates since previous study, consistent with resolving pulmonary edema.  No acute infiltrates are seen.  Mild scarring noted at the left lung base.  Heart size is within normal limits. The left internal jugular dual lumen center venous catheter is seen in appropriate position.  No pneumothorax identified.  IMPRESSION:  1.  Resolution of bilateral perihilar infiltrates/edema since prior exam. 2.  New left jugular dialysis catheter in appropriate position.  No pneumothorax identified.   Original Report Authenticated By: [name], M.D.   Dg Chest 2 View  10/31/2012   *RADIOLOGY REPORT*  Clinical Data: Shortness of breath for 5 days,  question pneumonia, history hypertension, diabetes  CHEST - 2 VIEW  Comparison: 10/30/2012  Findings: Enlargement of cardiac silhouette. Mediastinal contours normal with atherosclerotic calcification of aortic arch. Patchy airspace infiltrates greatest left perihilar favor pneumonia over edema. No gross pleural effusion or pneumothorax. Bones unremarkable.  IMPRESSION: Perihilar infiltrates greatest in left upper lobe question pneumonia.   Original Report Authenticated By: Ulyses Southward, M.D.   Dg Chest 2 View  10/30/2012   *RADIOLOGY REPORT*  Clinical Data: Shortness of breath.  CHEST - 2 VIEW  Comparison: Chest radiograph performed 10/04/2012  Findings: The lungs are  well-aerated.  Patchy bilateral airspace opacities are concerning for multifocal pneumonia.  A small left pleural effusion is seen.  No pneumothorax is identified.  The appearance is less typical for pulmonary edema.  The heart is mildly enlarged.  Calcification is noted in the aortic arch.  The patient is status post median sternotomy.  No acute osseous abnormalities are seen.  IMPRESSION: Patchy bilateral airspace opacities are concerning for multifocal pneumonia.  Small left pleural effusion noted.  The appearance is less typical for pulmonary edema.   Original Report Authenticated By: Tonia Ghent, M.D.   Ir Fluoro Guide Cv Line Left  11/02/2012   *RADIOLOGY REPORT*  Indication: 72 year old with end-stage renal disease.  Recently placed right arm fistula.  The patient needs hemodialysis until the fistula mature.  PROCEDURE(S): FLUOROSCOPIC AND ULTRASOUND GUIDED PLACEMENT OF A TUNNELED DIAYSIS CATHETER  Physician:  Rachelle Hora. Henn, MD  Medications: Versed 2 mg, Fentanyl 100 mcg. A radiology nurse monitored the patient for moderate sedation.  As antibiotic prophylaxis, Ancef  was ordered pre-procedure and administered intravenously within one hour of incision.  Moderate sedation time: 43 minutes  Fluoroscopy time: 6 minutes and 36 seconds   Procedure:Informed consent was obtained for placement of a tunneled dialysis catheter.  The patient was placed supine on the interventional table.  The left side of the chest was selected due to the recently placed right arm fistula.  Ultrasound confirmed a patent left internal jugularvein.  Ultrasound images were obtained for documentation.  The left side of the neck was prepped and draped in a sterile fashion.  The left side of the neck was anesthetized with 1% lidocaine.  Maximal barrier sterile technique was utilized including caps, mask, sterile gowns, sterile gloves, sterile drape, hand hygiene and skin antiseptic.  A small incision was made with #11 blade scalpel.  A 21 gauge needle directed into the left internal jugular vein with ultrasound guidance.  A micropuncture dilator set was placed. The wire would not easily advance into the SVC.  A Kumpe catheter and a Bentson wire used to cannulate the SVC.  A 27 cm tip to cuff HemoSplit catheter was selected.  The skin below the left clavicle was anesthetized and a small incision was made with an #11 blade scalpel.  A subcutaneous tunnel was formed to the vein dermatotomy site.  The catheter was brought through the tunnel.  The vein dermatotomy site was dilated to accommodate a peel-away sheath.  The catheter was placed through the peel-away sheath and directed into the central venous structures. The catheter preferentially went into the right innominate vein.  Catheter was pulled back and directed into the SVC with stiff Glidewires and stiff Amplatz wires.  Redirecting the catheter into the SVC was very tedious and difficult.  Eventually, the tip of the catheter was placed in the lower SVC.  Both lumens aspirated and flushed well.  Fluoroscopic images were obtained for documentation.   The proper amount of heparin was flushed in both lumens.  The vein dermatotomy site was closed using a single layer of absorbable suture and Dermabond.  The catheter was secured  to the skin using Prolene suture.  Findings:Catheter tip in the superior vena cava.  Complications: None  Impression:Successful placement of a left jugular tunneled dialysis catheter using ultrasound and fluoroscopic guidance.   Original Report Authenticated By: Richarda Overlie, M.D.   Ir US Guide Vasc Access Left  11/02/2012   *RADIOLOGY REPORT*  Indication: 72 year old with end-stage renal disease.  Recently placed right  arm fistula.  The patient needs hemodialysis until the fistula mature.  PROCEDURE(S): FLUOROSCOPIC AND ULTRASOUND GUIDED PLACEMENT OF A TUNNELED DIAYSIS CATHETER  Physician:  Rachelle Hora. Henn, MD  Medications: Versed 2 mg, Fentanyl 100 mcg. A radiology nurse monitored the patient for moderate sedation.  As antibiotic prophylaxis, Ancef  was ordered pre-procedure and administered intravenously within one hour of incision.  Moderate sedation time: 43 minutes  Fluoroscopy time: 6 minutes and 36 seconds  Procedure:Informed consent was obtained for placement of a tunneled dialysis catheter.  The patient was placed supine on the interventional table.  The left side of the chest was selected due to the recently placed right arm fistula.  Ultrasound confirmed a patent left internal jugularvein.  Ultrasound images were obtained for documentation.  The left side of the neck was prepped and draped in a sterile fashion.  The left side of the neck was anesthetized with 1% lidocaine.  Maximal barrier sterile technique was utilized including caps, mask, sterile gowns, sterile gloves, sterile drape, hand hygiene and skin antiseptic.  A small incision was made with #11 blade scalpel.  A 21 gauge needle directed into the left internal jugular vein with ultrasound guidance.  A micropuncture dilator set was placed. The wire would not easily advance into the SVC.  A Kumpe catheter and a Bentson wire used to cannulate the SVC.  A 27 cm tip to cuff HemoSplit catheter was selected.  The skin below the left clavicle was  anesthetized and a small incision was made with an #11 blade scalpel.  A subcutaneous tunnel was formed to the vein dermatotomy site.  The catheter was brought through the tunnel.  The vein dermatotomy site was dilated to accommodate a peel-away sheath.  The catheter was placed through the peel-away sheath and directed into the central venous structures. The catheter preferentially went into the right innominate vein.  Catheter was pulled back and directed into the SVC with stiff Glidewires and stiff Amplatz wires.  Redirecting the catheter into the SVC was very tedious and difficult.  Eventually, the tip of the catheter was placed in the lower SVC.  Both lumens aspirated and flushed well.  Fluoroscopic images were obtained for documentation.   The proper amount of heparin was flushed in both lumens.  The vein dermatotomy site was closed using a single layer of absorbable suture and Dermabond.  The catheter was secured to the skin using Prolene suture.  Findings:Catheter tip in the superior vena cava.  Complications: None  Impression:Successful placement of a left jugular tunneled dialysis catheter using ultrasound and fluoroscopic guidance.   Original Report Authenticated By: Richarda Overlie, M.D.    Microbiology: No results found for this or any previous visit (from the past 240 hour(s)).   Labs: Basic Metabolic Panel:  Recent Labs Lab 10/31/12 1300 11/01/12 0450 11/02/12 0525 11/03/12 0605 11/04/12 1159  NA 133* 134* 133* 136 131*  K 4.3 4.5 3.9 4.0 3.9  CL 94* 95* 92* 93* 87*  CO2 21 27 23 30 29   GLUCOSE 245* 124* 89 140* 279*  BUN 91* 96* 99* 66* 75*  CREATININE 4.70* 5.03* 5.03* 3.95* 3.95*  CALCIUM 9.0 8.7 9.6 9.6 10.1  PHOS  --  3.3 3.4 3.1 3.1   Liver Function Tests:  Recent Labs Lab 11/01/12 0450 11/02/12 0525 11/03/12 0605 11/04/12 1159  ALBUMIN 2.5* 3.1* 3.1* 3.4*   No results found for this basename: LIPASE, AMYLASE,  in the last 168 hours No results found for this  basename: AMMONIA,  in the last 168 hours CBC:  Recent Labs Lab 11/01/12 0450 11/02/12 0525 11/03/12 0655 11/04/12 1159  WBC 14.4* 12.8* 9.6 10.5  NEUTROABS  --   --  7.3  --   HGB 7.6* 8.6* 9.2* 10.3*  HCT 22.9* 26.3* 28.2* 30.9*  MCV 92.0 92.3 92.8 90.9  PLT 314 349 367 371   Cardiac Enzymes: No results found for this basename: CKTOTAL, CKMB, CKMBINDEX, TROPONINI,  in the last 168 hours BNP: BNP (last 3 results)  Recent Labs  10/30/12 0030  PROBNP 17301.0*   CBG:  Recent Labs Lab 11/06/12 0550 11/06/12 1134 11/06/12 1618 11/06/12 2032 11/07/12 0624  GLUCAP 304* 337* 222* 264* 249*   Signed:  Marelly Wehrman L  [facility] 11/07/2012, 10:45 AM

## 2012-11-07 NOTE — Progress Notes (Signed)
Hemodialysis-Awaiting chair at outpatient clinic. Will notify MD when available immediately.

## 2012-11-07 NOTE — Consult Note (Signed)
Jack Chung [birth year]  [id number].    Requesting MD: Dr. Arrie Aran Chief Complaint/Reason for Consult: Interested in Peritoneal dialysis (PD) catheter for ESRD HPI:  72 y/o WM with PMH of CKD  now progressed to ESRD with recent AVF placed on right forearm, CHF, OSA, sarcoidosis on chronic steroid, presents to the ER with 3 days hx of increase SOB, orthopnea, yellow sputum productive cough, but no chest pain. He is currently being treated for diastolic CHF, fluid overload, and pneumonia.  He recently had an AV Fistula placed (10/11/12) in his right arm as well as a temporary dialysis port.  He is anticipated to be discharged in the next few days, but wanted to be evaluated for a PD catheter prior to discharge.  He has done some research on PD catheters and the ability to do dialysis at home as well as the ability to have more time with his loved ones is appealing to the patient.  He understands some of the complications/challanges to the procedure.  The patient is currently only on aspirin for anticoagulation.  Currently lives at home with his wife.  The patient was seen while at dialysis today.  The patient denies any SOB, chest pain, abdominal pain.  No history of any hernias.    ROS: All systems reviewed and otherwise negative except for as above  Family History  Problem Relation Age of Onset  . COPD Mother   . COPD Father     Past Medical History  Diagnosis Date  . Hypertension   . Diabetes mellitus   . Chronic kidney disease     was on dialysis in July 2011  . Hypothyroidism   . CHF (congestive heart failure)   . Anemia   . Blind left eye   . Sleep apnea     not on CPAP  . Coronary artery disease   . Arthritis   . Sarcoidosis     Hx:of  . Diabetic retinopathy     Hx: of bilateral  . Edema   . Hyperlipidemia   . 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 Atherosclerosis of native arteries of the extremities, unspecified   . Eczema     Hx: of    Past  Surgical History  Procedure Laterality Date  . Cardiac surgery      total of 6 surgeries, 5 related to mrsa  . Coronary artery bypass graft  06/2009  . Cataract surgery      Hx: of  . Debridements      Hx: of secondary to MRSA  . Av fistula placement Right 10/11/2012    Procedure: ARTERIOVENOUS (AV) FISTULA CREATION;  Surgeon: Larina Earthly, MD;  Location: [facility] OR;  Service: Vascular;  Laterality: Right;    Social History:  reports that he has never smoked. He has never used smokeless tobacco. He reports that he does not drink alcohol or use illicit drugs.  Allergies:  Allergies  Allergen Reactions  . Flu Virus Vaccine     Gets the flu  . Lipitor (Atorvastatin)     Leg cramps    Medications Prior to Admission  Medication Sig Dispense Refill  . aspirin 325 MG tablet Take 325 mg by mouth daily.      . Calcium Carbonate-Vitamin D (CALCIUM + D PO) Take 1 tablet by mouth 2 (two) times daily.      . Cranberry 300 MG tablet Take 300 mg by mouth 2 (two) times daily.      Marland Kitchen  ferrous sulfate 325 (65 FE) MG tablet Take 325 mg by mouth daily before supper.       . furosemide (LASIX) 40 MG tablet Take 80 mg by mouth 2 (two) times daily.       . insulin aspart (NOVOLOG) 100 UNIT/ML injection Inject 2-8 Units into the skin 3 (three) times daily with meals. Take none if blood sugar < 150, 151-200 2 units, 201-250 4 units, 251-300 6 units, 301-350 8 units      . insulin glargine (LANTUS) 100 UNIT/ML injection Inject 0.3 mLs (30 Units total) into the skin every morning.  10 mL  3  . levothyroxine (SYNTHROID, LEVOTHROID) 112 MCG tablet Take 112 mcg by mouth daily.      Marland Kitchen oxyCODONE (ROXICODONE) 5 MG immediate release tablet Take 1 tablet (5 mg total) by mouth every 4 (four) hours as needed for pain.  20 tablet  0  . predniSONE (DELTASONE) 1 MG tablet Take 1 mg by mouth 2 (two) times daily.      . [DISCONTINUED] allopurinol (ZYLOPRIM) 100 MG tablet Take 100 mg by mouth 3 (three) times daily.       .  [DISCONTINUED] amLODipine (NORVASC) 10 MG tablet Take 10 mg by mouth daily.      . [DISCONTINUED] magnesium oxide (MAG-OX) 400 MG tablet Take 400 mg by mouth daily.      . [DISCONTINUED] metoprolol (LOPRESSOR) 100 MG tablet Take 100 mg by mouth 2 (two) times daily.        Blood pressure 105/61, pulse 66, temperature 97.9 F (36.6 C), temperature source Oral, resp. rate 15, height 5\' 10"  (1.778 m), weight 221 lb 9 oz (100.5 kg), SpO2 98.00%. Physical Exam: General: pleasant, WD/WN white male who is laying in bed in NAD HEENT: head is normocephalic, atraumatic.  Sclera are noninjected.  PERRL.  Ears and nose without any masses or lesions.  Mouth is pink and moist Chest:  Large central chest scar which is hypopigmented which runs from below the sternum to just above the xyphoid. Heart: very distant heart sounds.  No obvious  gallops, or rubs noted.  Unable to definitely hear a murmur.  Palpable pedal pulses bilaterally. Lungs: CTAB, no wheezes, rhonchi, or rales noted.  Respiratory effort nonlabored Abd: soft, NT/ND, +BS, no masses, hernias, or organomegaly, no abdominal scars noted, the patient wears his belt line below his pannus and below his umbilicus MS: all 4 extremities are symmetrical with no cyanosis, clubbing, +1 LE edema. Skin: warm and dry with no masses, lesions, or rashes Psych: A&Ox3 with an appropriate affect.  Results for orders placed during the hospital encounter of 10/30/12 (from the past 48 hour(s))  GLUCOSE, CAPILLARY     Status: Abnormal   Collection Time    11/05/12  4:09 PM      Result Value Range   Glucose-Capillary 236 (*) 70 - 99 mg/dL   Comment 1 Documented in Chart     Comment 2 Notify RN    GLUCOSE, CAPILLARY     Status: Abnormal   Collection Time    11/06/12  5:50 AM      Result Value Range   Glucose-Capillary 304 (*) 70 - 99 mg/dL  GLUCOSE, CAPILLARY     Status: Abnormal   Collection Time    11/06/12 11:34 AM      Result Value Range   Glucose-Capillary  337 (*) 70 - 99 mg/dL   Comment 1 Notify RN    GLUCOSE, CAPILLARY  Status: Abnormal   Collection Time    11/06/12  4:18 PM      Result Value Range   Glucose-Capillary 222 (*) 70 - 99 mg/dL   Comment 1 Notify RN    GLUCOSE, CAPILLARY     Status: Abnormal   Collection Time    11/06/12  8:32 PM      Result Value Range   Glucose-Capillary 264 (*) 70 - 99 mg/dL   Comment 1 Notify RN     Comment 2 Documented in Chart    GLUCOSE, CAPILLARY     Status: Abnormal   Collection Time    11/07/12  6:24 AM      Result Value Range   Glucose-Capillary 249 (*) 70 - 99 mg/dL   No results found.     Assessment/Plan ESRD - need for PD Cath placement, s/p AV fistula placement on 10/11/12 Diastolic CHF s/p 6 cardiac surgeries complicated by MRSA, most recent in 2011 Pneumonia - resolved, abx stopped Other medical problems - HTN, Complicated DM, Hypothyroidism, Chronic disease anemia, OSA, CAD, Sarcoidosis, HLD, Gout, and atherosclerosis of LE arteries  Plan: 1.  The patient will need outpatient follow up with Dr. Derrell Lolling in approximately 2 weeks.  Office to call him with appt time. 2.  Prior to his appt he will need the HD center to do some formal education/training about PD and the catheter, would ask nephrology to arrange this with the dialysis center.     Aris Georgia 11/07/2012, 12:27 PM Pager: [phone]

## 2012-11-07 NOTE — Telephone Encounter (Signed)
         Aris Georgia, PA-C ','<More Detail >>       Aris Georgia, [place] [phone])  Jack Chung     MRN: [id number] DOB: 02-21-1941     Pt Home: [id number]     Sent: Tue November 07, 2012 1:02 PM     To: June Leap                          Message    Please schedule appt with Dr. Derrell Lolling in 2 weeks to eval for PD (peritoneal dialysis) catheter placement in this patient who has now progressed to ESRD                  Called and spoke with patient's wife Jack Chung [phone] set appt up for 6/19 at 9:20

## 2012-11-07 NOTE — Progress Notes (Signed)
Patient has returned to unit from Hemodialysis; will continue to monitor patient. Lorretta Harp RN

## 2012-11-07 NOTE — Progress Notes (Signed)
Patient ID: Jack Chung, male   DOB: 1940-11-20, 72 y.o.   MRN: [id number]  [facility] Progress Note    Subjective:   No complaints   Objective:   BP 93/57  Pulse 65  Temp(Src) 97.9 F (36.6 C) (Oral)  Resp 14  Ht 5\' 10"  (1.778 m)  Wt 100.5 kg (221 lb 9 oz)  BMI 31.79 kg/m2  SpO2 98%  Intake/Output: I/O last 3 completed shifts: In: 843 [P.O.:840; I.V.:3] Out: 200 [Urine:200]   Intake/Output this shift:  Total I/O In: 243 [P.O.:240; I.V.:3] Out: -  Weight change: 1.189 kg (2 lb 10 oz)  Physical Exam: Gen:WD WN WM in NAD CVS:no rub Resp:cta NWG:NFAOZH Ext:no edema, [name] AVF +T/B  Labs: BMET  Recent Labs Lab 10/31/12 1300 11/01/12 0450 11/02/12 0525 11/03/12 0605 11/04/12 1159  NA 133* 134* 133* 136 131*  K 4.3 4.5 3.9 4.0 3.9  CL 94* 95* 92* 93* 87*  CO2 21 27 23 30 29   GLUCOSE 245* 124* 89 140* 279*  BUN 91* 96* 99* 66* 75*  CREATININE 4.70* 5.03* 5.03* 3.95* 3.95*  ALBUMIN  --  2.5* 3.1* 3.1* 3.4*  CALCIUM 9.0 8.7 9.6 9.6 10.1  PHOS  --  3.3 3.4 3.1 3.1   CBC  Recent Labs Lab 11/01/12 0450 11/02/12 0525 11/03/12 0655 11/04/12 1159  WBC 14.4* 12.8* 9.6 10.5  NEUTROABS  --   --  7.3  --   HGB 7.6* 8.6* 9.2* 10.3*  HCT 22.9* 26.3* 28.2* 30.9*  MCV 92.0 92.3 92.8 90.9  PLT 314 349 367 371    @IMGRELPRIORS @ Medications:    . allopurinol  100 mg Oral BID  . aspirin  325 mg Oral Daily  . calcium-vitamin D  1 tablet Oral BID  . darbepoetin (ARANESP) injection - NON-DIALYSIS  100 mcg Subcutaneous Q Wed-1800  . ferrous sulfate  325 mg Oral QAC supper  . furosemide  80 mg Oral BID  . heparin  5,000 Units Subcutaneous Q8H  . insulin aspart  0-5 Units Subcutaneous QHS  . insulin aspart  0-9 Units Subcutaneous TID WC  . insulin aspart  4 Units Subcutaneous TID WC  . insulin glargine  30 Units Subcutaneous Daily  . levothyroxine  112 mcg Oral Daily  . metoprolol  25 mg Oral BID  . predniSONE  1 mg Oral BID WC  . sodium  chloride  3 mL Intravenous Q12H     Assessment/ Plan:    1. New ESRD- seen on HD today.  Are awaiting insurance clearance for outpt HD and location.  Have also called [facility] to evaluate patient for possible PD cathter placement either as inpt or more likely an outpt as he will need a home visit for suitability which is as of yet has not been  performed. Awaiting outpt spot prior to discharge 2. Acute on chronic diastolic CHF- improved with HD 3. DM- per primary svc 4. 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 HTN- stable 6. Vascular access- [name] AVF placed 10/11/12 by Dr. Arbie Cookey. Now has tunneled LIJ HD cath and will require PD catheter once evaluated by Surgery.  7. Sarcoidosis- stable 8. OSA- stable 9. Dispo- awaiting outpt HD  Jack Chung A 11/07/2012, 11:59 AM

## 2012-11-08 ENCOUNTER — Ambulatory Visit: Payer: Medicare Other | Admitting: Physical Therapy

## 2012-11-08 ENCOUNTER — Encounter (HOSPITAL_COMMUNITY): Payer: Self-pay | Admitting: Emergency Medicine

## 2012-11-08 ENCOUNTER — Emergency Department (HOSPITAL_COMMUNITY)
Admission: EM | Admit: 2012-11-08 | Discharge: 2012-11-09 | Disposition: A | Discharge status: Home / Self Care | Payer: Medicare Other | Attending: Emergency Medicine | Admitting: Emergency Medicine

## 2012-11-08 ENCOUNTER — Telehealth (INDEPENDENT_AMBULATORY_CARE_PROVIDER_SITE_OTHER): Payer: Self-pay

## 2012-11-08 DIAGNOSIS — Z8669 Personal history of other diseases of the nervous system and sense organs: Secondary | ICD-10-CM | POA: Insufficient documentation

## 2012-11-08 DIAGNOSIS — Z862 Personal history of diseases of the blood and blood-forming organs and certain disorders involving the immune mechanism: Secondary | ICD-10-CM | POA: Insufficient documentation

## 2012-11-08 DIAGNOSIS — R0609 Other forms of dyspnea: Secondary | ICD-10-CM

## 2012-11-08 DIAGNOSIS — Y832 Surgical operation with anastomosis, bypass or graft as the cause of abnormal reaction of the patient, or of later complication, without mention of misadventure at the time of the procedure: Secondary | ICD-10-CM | POA: Insufficient documentation

## 2012-11-08 DIAGNOSIS — N186 End stage renal disease: Secondary | ICD-10-CM | POA: Insufficient documentation

## 2012-11-08 DIAGNOSIS — M1A9XX0 Chronic gout, unspecified, without tophus (tophi): Secondary | ICD-10-CM | POA: Insufficient documentation

## 2012-11-08 DIAGNOSIS — IMO0002 Reserved for concepts with insufficient information to code with codable children: Secondary | ICD-10-CM | POA: Insufficient documentation

## 2012-11-08 DIAGNOSIS — I509 Heart failure, unspecified: Secondary | ICD-10-CM | POA: Insufficient documentation

## 2012-11-08 DIAGNOSIS — Z872 Personal history of diseases of the skin and subcutaneous tissue: Secondary | ICD-10-CM | POA: Insufficient documentation

## 2012-11-08 DIAGNOSIS — Z79899 Other long term (current) drug therapy: Secondary | ICD-10-CM | POA: Insufficient documentation

## 2012-11-08 DIAGNOSIS — Z8679 Personal history of other diseases of the circulatory system: Secondary | ICD-10-CM | POA: Insufficient documentation

## 2012-11-08 DIAGNOSIS — Z7982 Long term (current) use of aspirin: Secondary | ICD-10-CM | POA: Insufficient documentation

## 2012-11-08 DIAGNOSIS — Z95828 Presence of other vascular implants and grafts: Secondary | ICD-10-CM

## 2012-11-08 DIAGNOSIS — I2510 Atherosclerotic heart disease of native coronary artery without angina pectoris: Secondary | ICD-10-CM | POA: Insufficient documentation

## 2012-11-08 DIAGNOSIS — H5440 Blindness, one eye, unspecified eye: Secondary | ICD-10-CM | POA: Insufficient documentation

## 2012-11-08 DIAGNOSIS — Z992 Dependence on renal dialysis: Secondary | ICD-10-CM | POA: Insufficient documentation

## 2012-11-08 DIAGNOSIS — M1A00X0 Idiopathic chronic gout, unspecified site, without tophus (tophi): Secondary | ICD-10-CM | POA: Insufficient documentation

## 2012-11-08 DIAGNOSIS — E1139 Type 2 diabetes mellitus with other diabetic ophthalmic complication: Secondary | ICD-10-CM | POA: Insufficient documentation

## 2012-11-08 DIAGNOSIS — Z8639 Personal history of other endocrine, nutritional and metabolic disease: Secondary | ICD-10-CM | POA: Insufficient documentation

## 2012-11-08 DIAGNOSIS — Z9889 Other specified postprocedural states: Secondary | ICD-10-CM | POA: Insufficient documentation

## 2012-11-08 DIAGNOSIS — Z951 Presence of aortocoronary bypass graft: Secondary | ICD-10-CM | POA: Insufficient documentation

## 2012-11-08 DIAGNOSIS — E039 Hypothyroidism, unspecified: Secondary | ICD-10-CM | POA: Insufficient documentation

## 2012-11-08 DIAGNOSIS — I120 Hypertensive chronic kidney disease with stage 5 chronic kidney disease or end stage renal disease: Secondary | ICD-10-CM | POA: Insufficient documentation

## 2012-11-08 DIAGNOSIS — E11319 Type 2 diabetes mellitus with unspecified diabetic retinopathy without macular edema: Secondary | ICD-10-CM | POA: Insufficient documentation

## 2012-11-08 LAB — CBC
HCT: 35.5 % — ABNORMAL LOW (ref 39.0–52.0)
Hemoglobin: 11.4 g/dL — ABNORMAL LOW (ref 13.0–17.0)
RDW: 15.6 % — ABNORMAL HIGH (ref 11.5–15.5)
WBC: 10.9 10*3/uL — ABNORMAL HIGH (ref 4.0–10.5)

## 2012-11-08 LAB — GLUCOSE, CAPILLARY: Glucose-Capillary: 194 mg/dL — ABNORMAL HIGH (ref 70–99)

## 2012-11-08 LAB — RENAL FUNCTION PANEL
CO2: 26 mEq/L (ref 19–32)
Chloride: 94 mEq/L — ABNORMAL LOW (ref 96–112)
GFR calc Af Amer: 16 mL/min — ABNORMAL LOW (ref >90)
GFR calc non Af Amer: 14 mL/min — ABNORMAL LOW (ref >90)
Glucose, Bld: 207 mg/dL — ABNORMAL HIGH (ref 70–99)
Potassium: 4.1 mEq/L (ref 3.5–5.1)
Sodium: 133 mEq/L — ABNORMAL LOW (ref 135–145)

## 2012-11-08 NOTE — ED Notes (Signed)
Pt. Hemodialysis catheter placed yesterday. Today had bleeding at site. Bleeding controlled at this time.

## 2012-11-08 NOTE — Discharge Summary (Signed)
Addendum to discharge summary from 6/3 by Crista Curb.    Patient states that he feels well. He feels very comfortable going home and following up at the dialysis center tomorrow.  General: Outpatient male, no acute distress HEENT: Poor dentition, moist mucous membranes Cardiovascular: RRR without MGR Respiratory: Diminished bilateral breath sounds, no rales, rhonchi, wheeze. No increased work of breathing ABG: Normal active bowel sounds, soft, nondistended MSK: No lower extremity edema, normal tone and bulk Skin: Titled hemodialysis catheter in place on the left chest without hematoma. Mild ecchymoses. No surrounding erythema or induration.  No new changes were made to the patient's discharge instructions or medications.

## 2012-11-08 NOTE — Telephone Encounter (Signed)
Spoke to patient to give appointment for PD (Peritoneal Dialysis) Catheter Placement 11/10/12 @ 9:30 w/Dr. Derrell Lolling.  Dr. Geanie Berlin aware of appt date & time.

## 2012-11-08 NOTE — Progress Notes (Signed)
Patient ID: DIRON HADDON, male   DOB: Apr 29, 1941, 72 y.o.   MRN: [id number]  [facility] Progress Note    Subjective:   No complaints   Objective:   BP 102/61  Pulse 80  Temp(Src) 98.1 F (36.7 C) (Oral)  Resp 18  Ht 5\' 10"  (1.778 m)  Wt 99.837 kg (220 lb 1.6 oz)  BMI 31.58 kg/m2  SpO2 94%  Intake/Output: I/O last 3 completed shifts: In: 243 [P.O.:240; I.V.:3] Out: 1100 [Other:1100]   Intake/Output this shift:  Total I/O In: 363 [P.O.:360; I.V.:3] Out: 300 [Urine:300] Weight change: -0.289 kg (-10.2 oz)  Physical Exam: Gen:WD WN WM in NAD CVS:no rub Resp:CTA WUJ:WJXBJY Ext:no edema, [name] AVF +T/B  Labs: BMET  Recent Labs Lab 11/02/12 0525 11/03/12 0605 11/04/12 1159 11/08/12 0559  NA 133* 136 131* 133*  K 3.9 4.0 3.9 4.1  CL 92* 93* 87* 94*  CO2 23 30 29 26   GLUCOSE 89 140* 279* 207*  BUN 99* 66* 75* 42*  CREATININE 5.03* 3.95* 3.95* 3.96*  ALBUMIN 3.1* 3.1* 3.4* 3.2*  CALCIUM 9.6 9.6 10.1 9.7  PHOS 3.4 3.1 3.1 3.8   CBC  Recent Labs Lab 11/02/12 0525 11/03/12 0655 11/04/12 1159 11/08/12 0559  WBC 12.8* 9.6 10.5 10.9*  NEUTROABS  --  7.3  --   --   HGB 8.6* 9.2* 10.3* 11.4*  HCT 26.3* 28.2* 30.9* 35.5*  MCV 92.3 92.8 90.9 93.9  PLT 349 367 371 304    @IMGRELPRIORS @ Medications:    . allopurinol  100 mg Oral BID  . aspirin  325 mg Oral Daily  . calcium-vitamin D  1 tablet Oral BID  . darbepoetin (ARANESP) injection - NON-DIALYSIS  100 mcg Subcutaneous Q Wed-1800  . ferrous sulfate  325 mg Oral QAC supper  . furosemide  80 mg Oral BID  . heparin  5,000 Units Subcutaneous Q8H  . insulin aspart  0-5 Units Subcutaneous QHS  . insulin aspart  0-9 Units Subcutaneous TID WC  . insulin aspart  4 Units Subcutaneous TID WC  . insulin glargine  30 Units Subcutaneous Daily  . levothyroxine  112 mcg Oral Daily  . metoprolol  25 mg Oral BID  . predniSONE  1 mg Oral BID WC  . sodium chloride  3 mL Intravenous Q12H      Assessment/ Plan:   1. New ESRD- pt is set up for MWF at [facility] and is due at that center tomorrow at 9:15am.  also referred to [facility] for PD catheter placement but earliest available is 11/23/12.  Will /fu as oupt at [facility] HD unit.   2. Acute on chronic diastolic CHF- improved with HD 3. DM- per primary svc 4. 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 HTN- stable 6. Vascular access- [name] AVF placed 10/11/12 by Dr. Arbie Cookey. Now has tunneled LIJ HD cath and will require PD catheter once evaluated by Surgery.  7. Sarcoidosis- stable 8. OSA- stable 9. Dispo- set up for outpt HD. Stable for d/c    Carra Brindley A 11/08/2012, 1:21 PM

## 2012-11-08 NOTE — ED Notes (Addendum)
Pt c/o bleeding at L hemodialysis catheter site/double lumen x 10 min.  States it was placed yesterday.  Discharged from hospital today.

## 2012-11-08 NOTE — Progress Notes (Signed)
Patient's IV has been discontinued, patient and wife verbalizes understanding of discharge instructions and the patient will be discharged home with wife.  Lorretta Harp RN

## 2012-11-08 NOTE — Progress Notes (Signed)
Hemodialysis-Pt has chair at [facility] on MWF second shift. Pt will need to be there at 9am on Friday to sign paperwork. Dr. Arrie Aran aware. Will give this information to patient.

## 2012-11-09 ENCOUNTER — Ambulatory Visit: Payer: Medicare Other | Attending: Nephrology

## 2012-11-09 DIAGNOSIS — Z9181 History of falling: Secondary | ICD-10-CM | POA: Insufficient documentation

## 2012-11-09 DIAGNOSIS — IMO0001 Reserved for inherently not codable concepts without codable children: Secondary | ICD-10-CM | POA: Insufficient documentation

## 2012-11-09 DIAGNOSIS — R279 Unspecified lack of coordination: Secondary | ICD-10-CM | POA: Insufficient documentation

## 2012-11-09 LAB — GLUCOSE, CAPILLARY: Glucose-Capillary: 158 mg/dL — ABNORMAL HIGH (ref 70–99)

## 2012-11-09 NOTE — ED Provider Notes (Signed)
History     CSN: [id number]  Arrival date & time 11/08/12  2238   First MD Initiated Contact with Patient 11/08/12 2353      Chief Complaint  Patient presents with  . bleeding at HD catheter    HPI  History provided by the patient and significant other. Patient is a 72 year old male with history of hypertension, diabetes, CHF and chronic kidney disease who presents with concerns for small amounts of bleeding around left chest Port-A-Cath. Patient was recently hospitalized and had a Port-A-Cath placed at that time. He was discharged yesterday. He was given instructions that if he had any bleeding from the catheter he should return to emergency room immediately. Today there was some dry blood around the bandage and he also had small drips of blood from around the catheter site. He denies having any pain or swelling to the chest area. Denies any shortness of breath or other complaints. He did not use any treatments for the symptoms and came straight to the emergency room. No other aggravating or alleviating factors. No other associated symptoms    Past Medical History  Diagnosis Date  . Hypertension   . Diabetes mellitus   . Chronic kidney disease     was on dialysis in July 2011  . Hypothyroidism   . CHF (congestive heart failure)   . Anemia   . Blind left eye   . Sleep apnea     not on CPAP  . Coronary artery disease   . Arthritis   . Sarcoidosis     Hx:of  . Diabetic retinopathy     Hx: of bilateral  . Edema   . Hyperlipidemia   . 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 Atherosclerosis of native arteries of the extremities, unspecified   . Eczema     Hx: of    Past Surgical History  Procedure Laterality Date  . Cardiac surgery      total of 6 surgeries, 5 related to mrsa  . Coronary artery bypass graft  06/2009  . Cataract surgery      Hx: of  . Debridements      Hx: of secondary to MRSA  . Av fistula placement Right 10/11/2012    Procedure:  ARTERIOVENOUS (AV) FISTULA CREATION;  Surgeon: Larina Earthly, MD;  Location: [facility] OR;  Service: Vascular;  Laterality: Right;    Family History  Problem Relation Age of Onset  . COPD Mother   . COPD Father     History  Substance Use Topics  . Smoking status: Never Smoker   . Smokeless tobacco: Never Used  . Alcohol Use: No      Review of Systems  Respiratory: Negative for cough and shortness of breath.   Cardiovascular: Negative for chest pain.  Neurological: Negative for light-headedness.  All other systems reviewed and are negative.    Allergies  Flu virus vaccine and Lipitor  Home Medications   Current Outpatient Rx  Name  Route  Sig  Dispense  Refill  . allopurinol (ZYLOPRIM) 100 MG tablet   Oral   Take 1 tablet (100 mg total) by mouth 2 (two) times daily.         Marland Kitchen aspirin 325 MG tablet   Oral   Take 325 mg by mouth daily.         . Calcium Carbonate-Vitamin D (CALCIUM + D PO)   Oral   Take 1 tablet by mouth 2 (two) times daily.         Marland Kitchen  Cranberry 300 MG tablet   Oral   Take 300 mg by mouth 2 (two) times daily. Hold while in hospital         . insulin aspart (NOVOLOG) 100 UNIT/ML injection   Subcutaneous   Inject 2-8 Units into the skin 3 (three) times daily with meals. Take none if blood sugar < 150, 151-200 2 units, 201-250 4 units, 251-300 6 units, 301-350 8 units         . insulin glargine (LANTUS) 100 UNIT/ML injection   Subcutaneous   Inject 0.3 mLs (30 Units total) into the skin every morning.   10 mL   3   . levothyroxine (SYNTHROID, LEVOTHROID) 112 MCG tablet   Oral   Take 112 mcg by mouth daily.         . metoprolol tartrate (LOPRESSOR) 25 MG tablet   Oral   Take 1 tablet (25 mg total) by mouth 2 (two) times daily.   60 tablet   0   . oxyCODONE (ROXICODONE) 5 MG immediate release tablet   Oral   Take 1 tablet (5 mg total) by mouth every 4 (four) hours as needed for pain.   20 tablet   0   . predniSONE (DELTASONE) 1 MG  tablet   Oral   Take 1 mg by mouth 2 (two) times daily.           BP 118/62  Pulse 84  Temp(Src) 98.1 F (36.7 C) (Oral)  Resp 18  SpO2 99%  Physical Exam  Nursing note and vitals reviewed. Constitutional: He is oriented to person, place, and time. He appears well-developed and well-nourished.  HENT:  Head: Normocephalic.  Cardiovascular: Normal rate and regular rhythm.   Pulmonary/Chest: Effort normal and breath sounds normal. No respiratory distress. He has no wheezes. He has no rales. He exhibits no tenderness.  Left Port-A-Cath and left chest intact without movement. It can be traced to the neck in good position, without any signs of swelling or hematoma. No active bleeding. There is some dry blood saturating the lower part of the dressing.  Abdominal: Soft.  Neurological: He is alert and oriented to person, place, and time.  Skin: Skin is warm. No erythema.  Psychiatric: He has a normal mood and affect. His behavior is normal.    ED Course  Procedures     1. Port-a-cath in place       MDM  Patient seen and evaluated. There is no active bleeding. Dressing removed without any active bleeding. Skin cleaned and a new dressing applied by the nurse. Port-A-Cath appears in place and secure. The patient will be discharged followup with his doctors as planned.          Angus Seller, PA-C 11/09/12 [phone]

## 2012-11-09 NOTE — ED Provider Notes (Signed)
Medical screening examination/treatment/procedure(s) were performed by non-physician practitioner and as supervising physician I was immediately available for consultation/collaboration.   Richardean Canal, MD 11/09/12 [phone]

## 2012-11-09 NOTE — ED Notes (Signed)
Dressing changed. Bleeding has stopped.

## 2012-11-10 ENCOUNTER — Encounter (HOSPITAL_COMMUNITY): Payer: Medicare Other

## 2012-11-10 ENCOUNTER — Ambulatory Visit (INDEPENDENT_AMBULATORY_CARE_PROVIDER_SITE_OTHER): Payer: Medicare Other | Admitting: General Surgery

## 2012-11-10 ENCOUNTER — Encounter (INDEPENDENT_AMBULATORY_CARE_PROVIDER_SITE_OTHER): Payer: Self-pay | Admitting: General Surgery

## 2012-11-10 ENCOUNTER — Ambulatory Visit: Payer: Medicare Other | Admitting: Physical Therapy

## 2012-11-10 VITALS — BP 120/66 | HR 68 | Temp 97.3°F | Resp 14 | Ht 70.0 in | Wt 228.0 lb

## 2012-11-10 DIAGNOSIS — T8571XA Infection and inflammatory reaction due to peritoneal dialysis catheter, initial encounter: Secondary | ICD-10-CM

## 2012-11-10 NOTE — Progress Notes (Signed)
Patient ID: Jack Chung, male   DOB: 05-21-1941, 72 y.o.   MRN: [id number]  Chief Complaint  Patient presents with  . New Evaluation    eval pd cath placement    HPI Jack Chung is a 72 y.o. male.  The patient is a 72 year old male with multiple medical problems as listed below. The patient was recently in the hospital secondary to pneumonia and congestive heart failure. The patient has recently begun hemodialysis secondary to renal disease. He is undergone fistula placement approximately 2 weeks ago.  In discussing with the patient the patient would like to be considered for peritoneal dialysis secondary to the fact that he travels a lot and does not want to undergo hemodialysis in the dialysis center. The patient has been educated at dialysis center has watched videos for education.  HPI  Past Medical History  Diagnosis Date  . Hypertension   . Diabetes mellitus   . Chronic kidney disease     was on dialysis in July 2011  . Hypothyroidism   . CHF (congestive heart failure)   . Anemia   . Blind left eye   . Sleep apnea     not on CPAP  . Coronary artery disease   . Arthritis   . Sarcoidosis     Hx:of  . Diabetic retinopathy     Hx: of bilateral  . Edema   . Hyperlipidemia   . 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 Atherosclerosis of native arteries of the extremities, unspecified   . Eczema     Hx: of    Past Surgical History  Procedure Laterality Date  . Cardiac surgery      total of 6 surgeries, 5 related to mrsa  . Coronary artery bypass graft  06/2009  . Cataract surgery      Hx: of  . Debridements      Hx: of secondary to MRSA  . Av fistula placement Right 10/11/2012    Procedure: ARTERIOVENOUS (AV) FISTULA CREATION;  Surgeon: Larina Earthly, MD;  Location: [facility] OR;  Service: Vascular;  Laterality: Right;    Family History  Problem Relation Age of Onset  . COPD Mother   . COPD Father     Social History History  Substance Use  Topics  . Smoking status: Never Smoker   . Smokeless tobacco: Never Used  . Alcohol Use: No    Allergies  Allergen Reactions  . Flu Virus Vaccine     Gets the flu  . Lipitor (Atorvastatin)     Leg cramps    Current Outpatient Prescriptions  Medication Sig Dispense Refill  . allopurinol (ZYLOPRIM) 100 MG tablet Take 1 tablet (100 mg total) by mouth 2 (two) times daily.      Marland Kitchen aspirin 325 MG tablet Take 325 mg by mouth daily.      . Calcium Carbonate-Vitamin D (CALCIUM + D PO) Take 1 tablet by mouth 2 (two) times daily.      . Cranberry 300 MG tablet Take 300 mg by mouth 2 (two) times daily. Hold while in hospital      . insulin aspart (NOVOLOG) 100 UNIT/ML injection Inject 2-8 Units into the skin 3 (three) times daily with meals. Take none if blood sugar < 150, 151-200 2 units, 201-250 4 units, 251-300 6 units, 301-350 8 units      . insulin glargine (LANTUS) 100 UNIT/ML injection Inject 0.3 mLs (30 Units total) into the skin every  morning.  10 mL  3  . levothyroxine (SYNTHROID, LEVOTHROID) 112 MCG tablet Take 112 mcg by mouth daily.      . metoprolol tartrate (LOPRESSOR) 25 MG tablet Take 1 tablet (25 mg total) by mouth 2 (two) times daily.  60 tablet  0  . predniSONE (DELTASONE) 1 MG tablet Take 1 mg by mouth 2 (two) times daily.       No current facility-administered medications for this visit.    Review of Systems Review of Systems  Constitutional: Negative.   HENT: Negative.   Respiratory: Negative.   Cardiovascular: Negative.   Gastrointestinal: Negative.   Neurological: Negative.     Blood pressure 120/66, pulse 68, temperature 97.3 F (36.3 C), temperature source Temporal, resp. rate 14, height 5\' 10"  (1.778 m), weight 228 lb (103.42 kg).  Physical Exam Physical Exam  Constitutional: He is oriented to person, place, and time. He appears well-developed and well-nourished.  HENT:  Head: Normocephalic and atraumatic.  Eyes: Conjunctivae and EOM are normal. Pupils  are equal, round, and reactive to light.  Neck: Normal range of motion. Neck supple.  Cardiovascular: Normal rate, regular rhythm and normal heart sounds.   Pulmonary/Chest: Effort normal.  Abdominal: Soft. Bowel sounds are normal. There is no tenderness. There is no rebound and no guarding.  Musculoskeletal: Normal range of motion.  Neurological: He is alert and oriented to person, place, and time.    Data Reviewed none  Assessment    72 year old male with multiple medical problems and chronic renal disease on hemodialysis. The patient said no previous abdominal operations, and is being seen for peritoneal dialysis catheter placement.     Plan    1. We'll proceed to the operating room for laparoscopic peritoneal dialysis catheter placement. 2. We discussed the potential risks and benefits of the procedure to include, but not limited to: Infection, bowel obstruction, malfunction of the peritoneal dialysis catheter, peritonitis, potential need for removal of peritoneal dialysis catheter, and any unforeseen potential morbidities. The patient voiced understanding as did his wife and he wished to proceed with the procedure.         Marigene Ehlers., Alexxa Sabet 11/10/2012, 9:55 AM

## 2012-11-13 ENCOUNTER — Ambulatory Visit: Payer: Self-pay | Admitting: Physical Therapy

## 2012-11-15 ENCOUNTER — Ambulatory Visit: Payer: Self-pay | Admitting: Physical Therapy

## 2012-11-17 ENCOUNTER — Ambulatory Visit: Payer: Self-pay | Admitting: Physical Therapy

## 2012-11-20 ENCOUNTER — Encounter (HOSPITAL_COMMUNITY): Payer: Medicare Other

## 2012-11-21 ENCOUNTER — Encounter (HOSPITAL_COMMUNITY): Payer: Self-pay | Admitting: Pharmacy Technician

## 2012-11-23 ENCOUNTER — Encounter (HOSPITAL_COMMUNITY): Payer: Self-pay

## 2012-11-23 ENCOUNTER — Encounter (HOSPITAL_COMMUNITY)
Admission: RE | Admit: 2012-11-23 | Discharge: 2012-11-23 | Disposition: A | Discharge status: Home / Self Care | Payer: Medicare Other | Source: Ambulatory Visit | Attending: General Surgery | Admitting: General Surgery

## 2012-11-23 ENCOUNTER — Ambulatory Visit (INDEPENDENT_AMBULATORY_CARE_PROVIDER_SITE_OTHER): Payer: Self-pay | Admitting: General Surgery

## 2012-11-23 HISTORY — DX: Insomnia, unspecified: G47.00

## 2012-11-23 HISTORY — DX: Other seasonal allergic rhinitis: J30.2

## 2012-11-23 HISTORY — DX: Personal history of other medical treatment: Z92.89

## 2012-11-23 LAB — SURGICAL PCR SCREEN: Staphylococcus aureus: NEGATIVE

## 2012-11-23 NOTE — Pre-Procedure Instructions (Signed)
RALF KONOPKA  11/23/2012   Your procedure is scheduled on:  Tuesday, June 24th  Report to [facility] Short Stay Center at 0530 AM. Come to main entrance "A" and go to east elevators up to 3rd floor. Check in at short stay desk.  Call this number if you have problems the morning of surgery: [phone]   Remember:   Do not eat food or drink liquids after midnight.   Take these medicines the morning of surgery with A SIP OF WATER: Synthroid, Lopressor, Prednisone   Do not wear jewelry.  Do not wear lotions, powders, or perfume, deodorant.  Do not shave 48 hours prior to surgery. Men may shave face and neck.  Do not bring valuables to the hospital.  [facility] is not responsible  for any belongings or valuables.  Contacts, dentures or bridgework may not be worn into surgery.  Leave suitcase in the car. After surgery it may be brought to your room.  For patients admitted to the hospital, checkout time is 11:00 AM the day of discharge.   Patients discharged the day of surgery will not be allowed to drive home.    Special Instructions: Shower using CHG 2 nights before surgery and the night before surgery.  If you shower the day of surgery use CHG.  Use special wash - you have one bottle of CHG for all showers.  You should use approximately 1/3 of the bottle for each shower.   Please read over the following fact sheets that you were given: Pain Booklet, Coughing and Deep Breathing, MRSA Information and Surgical Site Infection Prevention

## 2012-11-23 NOTE — Progress Notes (Signed)
11/23/12 1007  OBSTRUCTIVE SLEEP APNEA  Have you ever been diagnosed with sleep apnea through a sleep study? No  Do you snore loudly (loud enough to be heard through closed doors)?  1  Do you often feel tired, fatigued, or sleepy during the daytime? 0  Has anyone observed you stop breathing during your sleep? 0  Do you have, or are you being treated for high blood pressure? 1  BMI more than 35 kg/m2? 0  Age over 70 years old? 1  Gender: 1  Obstructive Sleep Apnea Score 4  Score 4 or greater  Results sent to PCP

## 2012-11-27 MED ORDER — CEFAZOLIN SODIUM-DEXTROSE 2-3 GM-% IV SOLR
2.0000 g | INTRAVENOUS | Status: AC
  Administered 2012-11-28: 2 g via INTRAVENOUS
  Filled 2012-11-27: qty 50

## 2012-11-28 ENCOUNTER — Encounter (HOSPITAL_COMMUNITY)
Admission: RE | Disposition: A | Discharge status: Home / Self Care | Payer: Self-pay | Source: Ambulatory Visit | Attending: General Surgery

## 2012-11-28 ENCOUNTER — Encounter (HOSPITAL_COMMUNITY): Payer: Self-pay | Admitting: Anesthesiology

## 2012-11-28 ENCOUNTER — Encounter: Payer: Self-pay | Admitting: Nephrology

## 2012-11-28 ENCOUNTER — Ambulatory Visit (HOSPITAL_COMMUNITY)
Admission: RE | Admit: 2012-11-28 | Discharge: 2012-11-28 | Disposition: A | Discharge status: Home / Self Care | Payer: Medicare Other | Source: Ambulatory Visit | Attending: General Surgery | Admitting: General Surgery

## 2012-11-28 ENCOUNTER — Encounter (HOSPITAL_COMMUNITY): Payer: Self-pay | Admitting: *Deleted

## 2012-11-28 ENCOUNTER — Ambulatory Visit (HOSPITAL_COMMUNITY): Payer: Medicare Other | Admitting: Anesthesiology

## 2012-11-28 DIAGNOSIS — I120 Hypertensive chronic kidney disease with stage 5 chronic kidney disease or end stage renal disease: Secondary | ICD-10-CM | POA: Insufficient documentation

## 2012-11-28 DIAGNOSIS — H5440 Blindness, one eye, unspecified eye: Secondary | ICD-10-CM | POA: Insufficient documentation

## 2012-11-28 DIAGNOSIS — E785 Hyperlipidemia, unspecified: Secondary | ICD-10-CM | POA: Insufficient documentation

## 2012-11-28 DIAGNOSIS — E1139 Type 2 diabetes mellitus with other diabetic ophthalmic complication: Secondary | ICD-10-CM | POA: Insufficient documentation

## 2012-11-28 DIAGNOSIS — I2510 Atherosclerotic heart disease of native coronary artery without angina pectoris: Secondary | ICD-10-CM | POA: Insufficient documentation

## 2012-11-28 DIAGNOSIS — Z7982 Long term (current) use of aspirin: Secondary | ICD-10-CM | POA: Insufficient documentation

## 2012-11-28 DIAGNOSIS — N186 End stage renal disease: Secondary | ICD-10-CM | POA: Insufficient documentation

## 2012-11-28 DIAGNOSIS — Z8614 Personal history of Methicillin resistant Staphylococcus aureus infection: Secondary | ICD-10-CM | POA: Insufficient documentation

## 2012-11-28 DIAGNOSIS — E11319 Type 2 diabetes mellitus with unspecified diabetic retinopathy without macular edema: Secondary | ICD-10-CM | POA: Insufficient documentation

## 2012-11-28 DIAGNOSIS — Z79899 Other long term (current) drug therapy: Secondary | ICD-10-CM | POA: Insufficient documentation

## 2012-11-28 DIAGNOSIS — E039 Hypothyroidism, unspecified: Secondary | ICD-10-CM | POA: Insufficient documentation

## 2012-11-28 DIAGNOSIS — Z794 Long term (current) use of insulin: Secondary | ICD-10-CM | POA: Insufficient documentation

## 2012-11-28 DIAGNOSIS — D649 Anemia, unspecified: Secondary | ICD-10-CM | POA: Insufficient documentation

## 2012-11-28 DIAGNOSIS — M109 Gout, unspecified: Secondary | ICD-10-CM | POA: Insufficient documentation

## 2012-11-28 DIAGNOSIS — Z951 Presence of aortocoronary bypass graft: Secondary | ICD-10-CM | POA: Insufficient documentation

## 2012-11-28 DIAGNOSIS — Z888 Allergy status to other drugs, medicaments and biological substances status: Secondary | ICD-10-CM | POA: Insufficient documentation

## 2012-11-28 DIAGNOSIS — Z887 Allergy status to serum and vaccine status: Secondary | ICD-10-CM | POA: Insufficient documentation

## 2012-11-28 DIAGNOSIS — IMO0002 Reserved for concepts with insufficient information to code with codable children: Secondary | ICD-10-CM | POA: Insufficient documentation

## 2012-11-28 DIAGNOSIS — I509 Heart failure, unspecified: Secondary | ICD-10-CM | POA: Insufficient documentation

## 2012-11-28 HISTORY — PX: CAPD INSERTION: SHX5233

## 2012-11-28 LAB — POCT I-STAT 4, (NA,K, GLUC, HGB,HCT)
Glucose, Bld: 167 mg/dL — ABNORMAL HIGH (ref 70–99)
HCT: 35 % — ABNORMAL LOW (ref 39.0–52.0)
Potassium: 4.2 mEq/L (ref 3.5–5.1)

## 2012-11-28 SURGERY — LAPAROSCOPIC INSERTION CONTINUOUS AMBULATORY PERITONEAL DIALYSIS  (CAPD) CATHETER
Anesthesia: General | Site: Abdomen | Wound class: Clean

## 2012-11-28 MED ORDER — SODIUM CHLORIDE 0.9 % IR SOLN
Status: DC | PRN
  Administered 2012-11-28: 1000 mL

## 2012-11-28 MED ORDER — OXYCODONE HCL 5 MG PO TABS: 5.0000 mg | ORAL_TABLET | Freq: Once | ORAL | Status: DC | PRN

## 2012-11-28 MED ORDER — ROCURONIUM BROMIDE 100 MG/10ML IV SOLN
INTRAVENOUS | Status: DC | PRN
  Administered 2012-11-28: 40 mg via INTRAVENOUS

## 2012-11-28 MED ORDER — METOPROLOL TARTRATE 25 MG PO TABS
25.0000 mg | ORAL_TABLET | Freq: Once | ORAL | Status: AC
  Administered 2012-11-28: 25 mg via ORAL
  Filled 2012-11-28: qty 1

## 2012-11-28 MED ORDER — GLYCOPYRROLATE 0.2 MG/ML IJ SOLN
INTRAMUSCULAR | Status: DC | PRN
  Administered 2012-11-28: .4 mg via INTRAVENOUS

## 2012-11-28 MED ORDER — PROMETHAZINE HCL 25 MG/ML IJ SOLN: 6.2500 mg | INTRAMUSCULAR | Status: DC | PRN

## 2012-11-28 MED ORDER — OXYCODONE HCL 5 MG/5ML PO SOLN: 5.0000 mg | Freq: Once | ORAL | Status: DC | PRN

## 2012-11-28 MED ORDER — CHLORHEXIDINE GLUCONATE 4 % EX LIQD: 1.0000 "application " | Freq: Once | CUTANEOUS | Status: DC

## 2012-11-28 MED ORDER — PROPOFOL 10 MG/ML IV BOLUS
INTRAVENOUS | Status: DC | PRN
  Administered 2012-11-28: 160 mg via INTRAVENOUS

## 2012-11-28 MED ORDER — HYDROMORPHONE HCL PF 1 MG/ML IJ SOLN: 0.2500 mg | INTRAMUSCULAR | Status: DC | PRN

## 2012-11-28 MED ORDER — METOPROLOL TARTRATE 12.5 MG HALF TABLET
ORAL_TABLET | ORAL | Status: AC
  Filled 2012-11-28: qty 2

## 2012-11-28 MED ORDER — BUPIVACAINE HCL (PF) 0.25 % IJ SOLN
INTRAMUSCULAR | Status: AC
  Filled 2012-11-28: qty 30

## 2012-11-28 MED ORDER — FENTANYL CITRATE 0.05 MG/ML IJ SOLN
INTRAMUSCULAR | Status: DC | PRN
  Administered 2012-11-28: 100 ug via INTRAVENOUS

## 2012-11-28 MED ORDER — PHENYLEPHRINE HCL 10 MG/ML IJ SOLN
INTRAMUSCULAR | Status: DC | PRN
  Administered 2012-11-28 (×6): 80 ug via INTRAVENOUS

## 2012-11-28 MED ORDER — NEOSTIGMINE METHYLSULFATE 1 MG/ML IJ SOLN
INTRAMUSCULAR | Status: DC | PRN
  Administered 2012-11-28: 3 mg via INTRAVENOUS

## 2012-11-28 MED ORDER — ARTIFICIAL TEARS OP OINT
TOPICAL_OINTMENT | OPHTHALMIC | Status: DC | PRN
  Administered 2012-11-28: 1 via OPHTHALMIC

## 2012-11-28 MED ORDER — 0.9 % SODIUM CHLORIDE (POUR BTL) OPTIME
TOPICAL | Status: DC | PRN
  Administered 2012-11-28: 1000 mL

## 2012-11-28 MED ORDER — ONDANSETRON HCL 4 MG/2ML IJ SOLN
INTRAMUSCULAR | Status: DC | PRN
  Administered 2012-11-28: 4 mg via INTRAVENOUS

## 2012-11-28 MED ORDER — LIDOCAINE HCL (CARDIAC) 20 MG/ML IV SOLN
INTRAVENOUS | Status: DC | PRN
  Administered 2012-11-28: 50 mg via INTRAVENOUS

## 2012-11-28 MED ORDER — BUPIVACAINE-EPINEPHRINE 0.25% -1:200000 IJ SOLN
INTRAMUSCULAR | Status: DC | PRN
  Administered 2012-11-28: 7 mL

## 2012-11-28 MED ORDER — SODIUM CHLORIDE 0.9 % IV SOLN
INTRAVENOUS | Status: DC | PRN
  Administered 2012-11-28: 07:00:00 via INTRAVENOUS

## 2012-11-28 MED ORDER — OXYCODONE-ACETAMINOPHEN 10-325 MG PO TABS: 1.0000 | ORAL_TABLET | ORAL | Status: DC | PRN

## 2012-11-28 SURGICAL SUPPLY — 55 items
ADAPTER CATH SAFE LCK II CO PK (MISCELLANEOUS) ×1 IMPLANT
ADAPTER SAFE LOCK II CO PACK (MISCELLANEOUS) ×1
BAG DECANTER FOR FLEXI CONT (MISCELLANEOUS) IMPLANT
BENZOIN TINCTURE PRP APPL 2/3 (GAUZE/BANDAGES/DRESSINGS) ×2 IMPLANT
BLADE SURG ROTATE 9660 (MISCELLANEOUS) ×2 IMPLANT
CANISTER SUCTION 2500CC (MISCELLANEOUS) IMPLANT
CATH MONCRIEF POPOVICH (CATHETERS) IMPLANT
CHLORAPREP W/TINT 26ML (MISCELLANEOUS) ×2 IMPLANT
CLOTH BEACON ORANGE TIMEOUT ST (SAFETY) ×2 IMPLANT
COIL SWAN NECK LT (MISCELLANEOUS) ×2 IMPLANT
COIL SWAN NECK RT (MISCELLANEOUS) IMPLANT
COVER SURGICAL LIGHT HANDLE (MISCELLANEOUS) ×2 IMPLANT
DECANTER SPIKE VIAL GLASS SM (MISCELLANEOUS) IMPLANT
DISSECTOR BLUNT TIP ENDO 5MM (MISCELLANEOUS) IMPLANT
DRAPE WARM FLUID 44X44 (DRAPE) ×2 IMPLANT
DRSG OPSITE 4X5.5 SM (GAUZE/BANDAGES/DRESSINGS) IMPLANT
DRSG PAD ABDOMINAL 8X10 ST (GAUZE/BANDAGES/DRESSINGS) IMPLANT
DRSG TEGADERM 4X4.75 (GAUZE/BANDAGES/DRESSINGS) IMPLANT
ELECT REM PT RETURN 9FT ADLT (ELECTROSURGICAL) ×2
ELECTRODE REM PT RTRN 9FT ADLT (ELECTROSURGICAL) ×1 IMPLANT
GAUZE SPONGE 2X2 8PLY STRL LF (GAUZE/BANDAGES/DRESSINGS) ×1 IMPLANT
GLOVE BIO SURGEON STRL SZ7 (GLOVE) ×2 IMPLANT
GLOVE BIO SURGEON STRL SZ7.5 (GLOVE) ×4 IMPLANT
GLOVE BIOGEL PI IND STRL 6.5 (GLOVE) ×1 IMPLANT
GLOVE BIOGEL PI IND STRL 7.0 (GLOVE) ×1 IMPLANT
GLOVE BIOGEL PI IND STRL 7.5 (GLOVE) ×1 IMPLANT
GLOVE BIOGEL PI INDICATOR 6.5 (GLOVE) ×1
GLOVE BIOGEL PI INDICATOR 7.0 (GLOVE) ×1
GLOVE BIOGEL PI INDICATOR 7.5 (GLOVE) ×1
GLOVE SURG SS PI 6.5 STRL IVOR (GLOVE) ×2 IMPLANT
GOWN STRL NON-REIN LRG LVL3 (GOWN DISPOSABLE) ×6 IMPLANT
GOWN STRL REIN XL XLG (GOWN DISPOSABLE) ×2 IMPLANT
KIT BASIN OR (CUSTOM PROCEDURE TRAY) ×2 IMPLANT
KIT ROOM TURNOVER OR (KITS) ×2 IMPLANT
NEEDLE 22X1 1/2 (OR ONLY) (NEEDLE) ×2 IMPLANT
NS IRRIG 1000ML POUR BTL (IV SOLUTION) ×2 IMPLANT
PAD ARMBOARD 7.5X6 YLW CONV (MISCELLANEOUS) ×4 IMPLANT
SET CYSTO W/LG BORE CLAMP LF (SET/KITS/TRAYS/PACK) ×2 IMPLANT
SET EXTENSION TUBING 8  CATH (SET/KITS/TRAYS/PACK) ×2 IMPLANT
SET IRRIG TUBING LAPAROSCOPIC (IRRIGATION / IRRIGATOR) IMPLANT
SLEEVE ENDOPATH XCEL 5M (ENDOMECHANICALS) ×4 IMPLANT
SPONGE GAUZE 2X2 STER 10/PKG (GAUZE/BANDAGES/DRESSINGS) ×1
SPONGE GAUZE 4X4 12PLY (GAUZE/BANDAGES/DRESSINGS) ×2 IMPLANT
STRIP CLOSURE SKIN 1/2X4 (GAUZE/BANDAGES/DRESSINGS) ×2 IMPLANT
SUT MNCRL AB 4-0 PS2 18 (SUTURE) ×2 IMPLANT
SUT PROLENE 2 0 CT2 30 (SUTURE) ×8 IMPLANT
SUT SILK 2 0 SH (SUTURE) ×4 IMPLANT
SUT SILK 3 0 SH 30 (SUTURE) ×4 IMPLANT
TAPE CLOTH SURG 4X10 WHT LF (GAUZE/BANDAGES/DRESSINGS) ×2 IMPLANT
TOWEL OR 17X24 6PK STRL BLUE (TOWEL DISPOSABLE) ×2 IMPLANT
TOWEL OR 17X26 10 PK STRL BLUE (TOWEL DISPOSABLE) ×2 IMPLANT
TRAY LAPAROSCOPIC (CUSTOM PROCEDURE TRAY) ×2 IMPLANT
TROCAR FALLER TUNNELING (TROCAR) IMPLANT
TROCAR XCEL NON-BLD 11X100MML (ENDOMECHANICALS) ×2 IMPLANT
TROCAR XCEL NON-BLD 5MMX100MML (ENDOMECHANICALS) ×2 IMPLANT

## 2012-11-28 NOTE — Op Note (Signed)
Pre Operative Diagnosis:  End-stage renal disease  Post Operative Diagnosis: same  Procedure:laparoscopic CAPD catheter placement  Surgeon: Dr. Axel Filler  Assistant: none  Anesthesia: GETA  EBL: less than 5 cc  Complications: none  Counts: reported as correct x 2  Findings:  The patient had a CAPD catheter placed in the pelvis. 500cc of saline were instilled and evacuated easily. The catheter was sutured to the anterior abdominal wall.  Indications for procedure:  The patient is a 72 year old male with end-stage renal disease. Patient underwent a fistula placement recently, but wished for at home dialysis. Patient was seen in clinic and counseled and consented to have this electively place.  Details of the procedure:The patient was taken back to the operating room. The patient was placed in supine position with bilateral SCDs in place. After appropriate anitbiotics were confirmed, a time-out was confirmed and all facts were verified.  The Veress needle technique was used to insufflate the abdomen to 14 mm mercury after a stab incision was made in the right lower quadrant area. Subsequently to this a 5 mm trocar and camera then placed intra-abdominally. There was no injury to any intra-abdominal organs. A supraumbilical was placed under direct visualization as was a left lower quadrant trocar. The abdomen was inspected and seen to be free of adhesions. The left lower quadrant abdominal trocar was then upsized to a 10 mm trocar. Another 5 mmtrocar was then placed in between the right lower quadrant trocar and the umbilical trocar. The CAPD catheter was introduced into the left lower quadrant trocar. This was placed into the pelvis. The felt pad was then placed superior to the peritoneum and superior to the posterior fascia. The second felt pad was then placed subcutaneously.  At this time the catheter was then sutured to the anterior abdominal wall using the 2-0 silk in interrupted fashion  x1. At this time thepatient was placed in supine position and instilled 500 cc of saline. This instilled easily. 500cc with an also evacuated easily.  The insufflation was evacuated under direct visualization in the E. Against the intra-abdominal wall. The trochars were removed. All trocar sites were reapproximated using 4-0 Monocryl in subcuticular fashion. The catheter was unsecured and anterior abdominal wall using a 2-0 nylon  The patient was taken to the recovery room in stable condition.

## 2012-11-28 NOTE — Preoperative (Signed)
Beta Blockers   Reason not to administer Beta Blockers:Beta blocker taken @2100  on 11/27/12

## 2012-11-28 NOTE — Transfer of Care (Signed)
Immediate Anesthesia Transfer of Care Note  Patient: Jack Chung  Procedure(s) Performed: Procedure(s): LAPAROSCOPIC PERITONEAL DIALYSIS CATHETER PLACEMENT (N/A)  Patient Location: PACU  Anesthesia Type:General  Level of Consciousness: awake, alert  and oriented  Airway & Oxygen Therapy: Patient Spontanous Breathing and Patient connected to nasal cannula oxygen  Post-op Assessment: Report given to PACU RN, Post -op Vital signs reviewed and stable and Patient moving all extremities X 4  Post vital signs: Reviewed and stable  Complications: No apparent anesthesia complications

## 2012-11-28 NOTE — Interval H&P Note (Signed)
History and Physical Interval Note:  11/28/2012 7:41 AM  Jack Chung  has presented today for surgery, with the diagnosis of chronic kidney disease  The various methods of treatment have been discussed with the patient and family. After consideration of risks, benefits and other options for treatment, the patient has consented to  Procedure(s): LAPAROSCOPIC PERITONEAL DIALYSIS CATHETER PLACEMENT (N/A) as a surgical intervention .  The patient's history has been reviewed, patient examined, no change in status, stable for surgery.  I have reviewed the patient's chart and labs.  Questions were answered to the patient's satisfaction.     Marigene Ehlers., Jed Limerick

## 2012-11-28 NOTE — Anesthesia Postprocedure Evaluation (Signed)
Anesthesia Post Note  Patient: Jack Chung  Procedure(s) Performed: Procedure(s) (LRB): LAPAROSCOPIC PERITONEAL DIALYSIS CATHETER PLACEMENT (N/A)  Anesthesia type: general  Patient location: PACU  Post pain: Pain level controlled  Post assessment: Patient's Cardiovascular Status Stable  Last Vitals:  Filed Vitals:   11/28/12 0930  BP: 136/60  Pulse: 70  Temp:   Resp: 14    Post vital signs: Reviewed and stable  Level of consciousness: sedated  Complications: No apparent anesthesia complications

## 2012-11-28 NOTE — Anesthesia Preprocedure Evaluation (Addendum)
Anesthesia Evaluation  Patient identified by MRN, date of birth, ID band Patient awake    Reviewed: Allergy & Precautions, H&P , NPO status , Patient's Chart, lab work & pertinent test results, reviewed documented beta blocker date and time   Airway Mallampati: II TM Distance: >3 FB Neck ROM: Full    Dental  (+) Edentulous Upper, Edentulous Lower and Dental Advisory Given   Pulmonary sleep apnea , pneumonia -, resolved,    Pulmonary exam normal       Cardiovascular hypertension, Pt. on medications and Pt. on home beta blockers + CAD, + Peripheral Vascular Disease and +CHF  Echo 10/2012 Study Conclusions  - Left ventricle: The cavity size was mildly dilated. Wall   thickness was increased in a pattern of mild LVH. Systolic  function was normal. The estimated ejection fraction was  in the range of 50% to 55%. Wall motion was normal   Neuro/Psych negative neurological ROS  negative psych ROS   GI/Hepatic Neg liver ROS,   Endo/Other  diabetes, Insulin DependentHypothyroidism   Renal/GU CRFRenal disease     Musculoskeletal  (+) Arthritis -,   Abdominal   Peds  Hematology negative hematology ROS (+)   Anesthesia Other Findings   Reproductive/Obstetrics                         Anesthesia Physical Anesthesia Plan  ASA: III  Anesthesia Plan: General   Post-op Pain Management:    Induction: Intravenous  Airway Management Planned: Oral ETT  Additional Equipment:   Intra-op Plan:   Post-operative Plan: Extubation in OR  Informed Consent: I have reviewed the patients History and Physical, chart, labs and discussed the procedure including the risks, benefits and alternatives for the proposed anesthesia with the patient or authorized representative who has indicated his/her understanding and acceptance.   Dental advisory given  Plan Discussed with: CRNA, Anesthesiologist and Surgeon  Anesthesia  Plan Comments:        Anesthesia Quick Evaluation

## 2012-11-28 NOTE — Anesthesia Procedure Notes (Signed)
Procedure Name: Intubation Date/Time: 11/28/2012 7:59 AM Performed by: Sharlene Dory E Pre-anesthesia Checklist: Patient identified, Emergency Drugs available, Suction available, Patient being monitored and Timeout performed Patient Re-evaluated:Patient Re-evaluated prior to inductionOxygen Delivery Method: Circle system utilized Preoxygenation: Pre-oxygenation with 100% oxygen Intubation Type: IV induction Ventilation: Two handed mask ventilation required, Oral airway inserted - appropriate to patient size and Mask ventilation without difficulty Laryngoscope Size: Miller and 3 Grade View: Grade I Tube type: Oral Tube size: 7.5 mm Number of attempts: 1 Airway Equipment and Method: Stylet Placement Confirmation: ETT inserted through vocal cords under direct vision,  positive ETCO2 and breath sounds checked- equal and bilateral Secured at: 23 cm Tube secured with: Tape Dental Injury: Teeth and Oropharynx as per pre-operative assessment  Comments: AOI per Octaviano Batty, SRNA with MDA supervision.

## 2012-11-28 NOTE — Progress Notes (Signed)
Jack Chung from PACU contacted Dr.Ramirez who wasn't concerned about the drainage;orders just to redress the area

## 2012-11-28 NOTE — H&P (View-Only) (Signed)
Patient ID: Jack Chung, male   DOB: 08/28/1940, 71 y.o.   MRN: [id number]  Chief Complaint  Patient presents with  . New Evaluation    eval pd cath placement    HPI Jack Chung is a 71 y.o. male.  The patient is a 71-year-old male with multiple medical problems as listed below. The patient was recently in the hospital secondary to pneumonia and congestive heart failure. The patient has recently begun hemodialysis secondary to renal disease. He is undergone fistula placement approximately 2 weeks ago.  In discussing with the patient the patient would like to be considered for peritoneal dialysis secondary to the fact that he travels a lot and does not want to undergo hemodialysis in the dialysis center. The patient has been educated at dialysis center has watched videos for education.  HPI  Past Medical History  Diagnosis Date  . Hypertension   . Diabetes mellitus   . Chronic kidney disease     was on dialysis in July 2011  . Hypothyroidism   . CHF (congestive heart failure)   . Anemia   . Blind left eye   . Sleep apnea     not on CPAP  . Coronary artery disease   . Arthritis   . Sarcoidosis     Hx:of  . Diabetic retinopathy     Hx: of bilateral  . Edema   . Hyperlipidemia   . 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 Atherosclerosis of native arteries of the extremities, unspecified   . Eczema     Hx: of    Past Surgical History  Procedure Laterality Date  . Cardiac surgery      total of 6 surgeries, 5 related to mrsa  . Coronary artery bypass graft  06/2009  . Cataract surgery      Hx: of  . Debridements      Hx: of secondary to MRSA  . Av fistula placement Right 10/11/2012    Procedure: ARTERIOVENOUS (AV) FISTULA CREATION;  Surgeon: Jack F Early, MD;  Location: [facility] OR;  Service: Vascular;  Laterality: Right;    Family History  Problem Relation Age of Onset  . COPD Mother   . COPD Father     Social History History  Substance Use  Topics  . Smoking status: Never Smoker   . Smokeless tobacco: Never Used  . Alcohol Use: No    Allergies  Allergen Reactions  . Flu Virus Vaccine     Gets the flu  . Lipitor (Atorvastatin)     Leg cramps    Current Outpatient Prescriptions  Medication Sig Dispense Refill  . allopurinol (ZYLOPRIM) 100 MG tablet Take 1 tablet (100 mg total) by mouth 2 (two) times daily.      . aspirin 325 MG tablet Take 325 mg by mouth daily.      . Calcium Carbonate-Vitamin D (CALCIUM + D PO) Take 1 tablet by mouth 2 (two) times daily.      . Cranberry 300 MG tablet Take 300 mg by mouth 2 (two) times daily. Hold while in hospital      . insulin aspart (NOVOLOG) 100 UNIT/ML injection Inject 2-8 Units into the skin 3 (three) times daily with meals. Take none if blood sugar < 150, 151-200 2 units, 201-250 4 units, 251-300 6 units, 301-350 8 units      . insulin glargine (LANTUS) 100 UNIT/ML injection Inject 0.3 mLs (30 Units total) into the skin every   morning.  10 mL  3  . levothyroxine (SYNTHROID, LEVOTHROID) 112 MCG tablet Take 112 mcg by mouth daily.      . metoprolol tartrate (LOPRESSOR) 25 MG tablet Take 1 tablet (25 mg total) by mouth 2 (two) times daily.  60 tablet  0  . predniSONE (DELTASONE) 1 MG tablet Take 1 mg by mouth 2 (two) times daily.       No current facility-administered medications for this visit.    Review of Systems Review of Systems  Constitutional: Negative.   HENT: Negative.   Respiratory: Negative.   Cardiovascular: Negative.   Gastrointestinal: Negative.   Neurological: Negative.     Blood pressure 120/66, pulse 68, temperature 97.3 F (36.3 C), temperature source Temporal, resp. rate 14, height 5' 10" (1.778 m), weight 228 lb (103.42 kg).  Physical Exam Physical Exam  Constitutional: He is oriented to person, place, and time. He appears well-developed and well-nourished.  HENT:  Head: Normocephalic and atraumatic.  Eyes: Conjunctivae and EOM are normal. Pupils  are equal, round, and reactive to light.  Neck: Normal range of motion. Neck supple.  Cardiovascular: Normal rate, regular rhythm and normal heart sounds.   Pulmonary/Chest: Effort normal.  Abdominal: Soft. Bowel sounds are normal. There is no tenderness. There is no rebound and no guarding.  Musculoskeletal: Normal range of motion.  Neurological: He is alert and oriented to person, place, and time.    Data Reviewed none  Assessment    71-year-old male with multiple medical problems and chronic renal disease on hemodialysis. The patient said no previous abdominal operations, and is being seen for peritoneal dialysis catheter placement.     Plan    1. We'll proceed to the operating room for laparoscopic peritoneal dialysis catheter placement. 2. We discussed the potential risks and benefits of the procedure to include, but not limited to: Infection, bowel obstruction, malfunction of the peritoneal dialysis catheter, peritonitis, potential need for removal of peritoneal dialysis catheter, and any unforeseen potential morbidities. The patient voiced understanding as did his wife and he wished to proceed with the procedure.         Rayleen Wyrick Jr., Rhodie Cienfuegos 11/10/2012, 9:55 AM    

## 2012-11-28 NOTE — Progress Notes (Signed)
Jack Kelp PA states pt is to return to his usual HD center tomorrow at regular time.

## 2012-11-29 ENCOUNTER — Telehealth (INDEPENDENT_AMBULATORY_CARE_PROVIDER_SITE_OTHER): Payer: Self-pay

## 2012-11-29 ENCOUNTER — Encounter (HOSPITAL_COMMUNITY): Payer: Self-pay | Admitting: General Surgery

## 2012-11-29 NOTE — Telephone Encounter (Signed)
Patients wife called stating she is concerned with her husbands symptoms. He has a fever of 102 and is extremely weak to the point he cant stand up and has a full bladder with abdominal pain but cannot urinate at all. I told her to take him to the ED. She states she can't get him to the car so I told her to call 911 and have an ambulance come pick him up and take him.

## 2012-11-30 ENCOUNTER — Emergency Department (HOSPITAL_COMMUNITY): Payer: Medicare Other

## 2012-11-30 ENCOUNTER — Encounter (HOSPITAL_COMMUNITY): Payer: Self-pay

## 2012-11-30 ENCOUNTER — Inpatient Hospital Stay (HOSPITAL_COMMUNITY)
Admission: EM | Admit: 2012-11-30 | Discharge: 2012-12-02 | DRG: 553 | Disposition: A | Discharge status: Home / Self Care | Payer: Medicare Other | Attending: Internal Medicine | Admitting: Internal Medicine

## 2012-11-30 DIAGNOSIS — Z794 Long term (current) use of insulin: Secondary | ICD-10-CM

## 2012-11-30 DIAGNOSIS — I5032 Chronic diastolic (congestive) heart failure: Secondary | ICD-10-CM | POA: Diagnosis present

## 2012-11-30 DIAGNOSIS — I120 Hypertensive chronic kidney disease with stage 5 chronic kidney disease or end stage renal disease: Secondary | ICD-10-CM | POA: Diagnosis present

## 2012-11-30 DIAGNOSIS — G4730 Sleep apnea, unspecified: Secondary | ICD-10-CM | POA: Diagnosis present

## 2012-11-30 DIAGNOSIS — Z7982 Long term (current) use of aspirin: Secondary | ICD-10-CM

## 2012-11-30 DIAGNOSIS — N186 End stage renal disease: Secondary | ICD-10-CM | POA: Diagnosis present

## 2012-11-30 DIAGNOSIS — E1165 Type 2 diabetes mellitus with hyperglycemia: Secondary | ICD-10-CM | POA: Diagnosis present

## 2012-11-30 DIAGNOSIS — N189 Chronic kidney disease, unspecified: Secondary | ICD-10-CM | POA: Diagnosis present

## 2012-11-30 DIAGNOSIS — D649 Anemia, unspecified: Secondary | ICD-10-CM | POA: Diagnosis present

## 2012-11-30 DIAGNOSIS — E11319 Type 2 diabetes mellitus with unspecified diabetic retinopathy without macular edema: Secondary | ICD-10-CM | POA: Diagnosis present

## 2012-11-30 DIAGNOSIS — M949 Disorder of cartilage, unspecified: Secondary | ICD-10-CM | POA: Diagnosis present

## 2012-11-30 DIAGNOSIS — Z992 Dependence on renal dialysis: Secondary | ICD-10-CM

## 2012-11-30 DIAGNOSIS — R5381 Other malaise: Secondary | ICD-10-CM

## 2012-11-30 DIAGNOSIS — H5440 Blindness, one eye, unspecified eye: Secondary | ICD-10-CM | POA: Diagnosis present

## 2012-11-30 DIAGNOSIS — M899 Disorder of bone, unspecified: Secondary | ICD-10-CM | POA: Diagnosis present

## 2012-11-30 DIAGNOSIS — Z79899 Other long term (current) drug therapy: Secondary | ICD-10-CM

## 2012-11-30 DIAGNOSIS — N058 Unspecified nephritic syndrome with other morphologic changes: Secondary | ICD-10-CM | POA: Diagnosis present

## 2012-11-30 DIAGNOSIS — IMO0002 Reserved for concepts with insufficient information to code with codable children: Secondary | ICD-10-CM

## 2012-11-30 DIAGNOSIS — E1139 Type 2 diabetes mellitus with other diabetic ophthalmic complication: Secondary | ICD-10-CM | POA: Diagnosis present

## 2012-11-30 DIAGNOSIS — E785 Hyperlipidemia, unspecified: Secondary | ICD-10-CM | POA: Diagnosis present

## 2012-11-30 DIAGNOSIS — I2510 Atherosclerotic heart disease of native coronary artery without angina pectoris: Secondary | ICD-10-CM | POA: Diagnosis present

## 2012-11-30 DIAGNOSIS — Z951 Presence of aortocoronary bypass graft: Secondary | ICD-10-CM

## 2012-11-30 DIAGNOSIS — M109 Gout, unspecified: Principal | ICD-10-CM | POA: Diagnosis present

## 2012-11-30 DIAGNOSIS — I509 Heart failure, unspecified: Secondary | ICD-10-CM | POA: Diagnosis present

## 2012-11-30 DIAGNOSIS — Z66 Do not resuscitate: Secondary | ICD-10-CM | POA: Diagnosis present

## 2012-11-30 DIAGNOSIS — D72829 Elevated white blood cell count, unspecified: Secondary | ICD-10-CM | POA: Diagnosis present

## 2012-11-30 DIAGNOSIS — E039 Hypothyroidism, unspecified: Secondary | ICD-10-CM | POA: Diagnosis present

## 2012-11-30 DIAGNOSIS — N19 Unspecified kidney failure: Secondary | ICD-10-CM

## 2012-11-30 DIAGNOSIS — E1129 Type 2 diabetes mellitus with other diabetic kidney complication: Secondary | ICD-10-CM | POA: Diagnosis present

## 2012-11-30 DIAGNOSIS — Z8614 Personal history of Methicillin resistant Staphylococcus aureus infection: Secondary | ICD-10-CM

## 2012-11-30 DIAGNOSIS — I959 Hypotension, unspecified: Secondary | ICD-10-CM | POA: Diagnosis not present

## 2012-11-30 LAB — CBC
HCT: 32.4 % — ABNORMAL LOW (ref 39.0–52.0)
Hemoglobin: 10.4 g/dL — ABNORMAL LOW (ref 13.0–17.0)
MCHC: 32.1 g/dL (ref 30.0–36.0)
MCV: 95.6 fL (ref 78.0–100.0)
RDW: 15.8 % — ABNORMAL HIGH (ref 11.5–15.5)

## 2012-11-30 LAB — COMPREHENSIVE METABOLIC PANEL
ALT: <5 U/L (ref 0–53)
AST: 16 U/L (ref 0–37)
Albumin: 2.5 g/dL — ABNORMAL LOW (ref 3.5–5.2)
Alkaline Phosphatase: 79 U/L (ref 39–117)
BUN: 49 mg/dL — ABNORMAL HIGH (ref 6–23)
CO2: 24 mEq/L (ref 19–32)
Calcium: 9.2 mg/dL (ref 8.4–10.5)
Chloride: 96 mEq/L (ref 96–112)
Creatinine, Ser: 3.35 mg/dL — ABNORMAL HIGH (ref 0.50–1.35)
GFR calc Af Amer: 20 mL/min — ABNORMAL LOW (ref >90)
GFR calc non Af Amer: 17 mL/min — ABNORMAL LOW (ref >90)
Glucose, Bld: 202 mg/dL — ABNORMAL HIGH (ref 70–99)
Potassium: 4 mEq/L (ref 3.5–5.1)
Sodium: 135 mEq/L (ref 135–145)
Total Bilirubin: 0.3 mg/dL (ref 0.3–1.2)
Total Protein: 6.8 g/dL (ref 6.0–8.3)

## 2012-11-30 LAB — GRAM STAIN

## 2012-11-30 LAB — CBC WITH DIFFERENTIAL/PLATELET
Basophils Absolute: 0.1 10*3/uL (ref 0.0–0.1)
Basophils Relative: 1 % (ref 0–1)
Eosinophils Absolute: 0 10*3/uL (ref 0.0–0.7)
Eosinophils Relative: 0 % (ref 0–5)
HCT: 32.8 % — ABNORMAL LOW (ref 39.0–52.0)
Hemoglobin: 10.9 g/dL — ABNORMAL LOW (ref 13.0–17.0)
Lymphocytes Relative: 10 % — ABNORMAL LOW (ref 12–46)
Lymphs Abs: 1.4 10*3/uL (ref 0.7–4.0)
MCH: 31.4 pg (ref 26.0–34.0)
MCHC: 33.2 g/dL (ref 30.0–36.0)
MCV: 94.5 fL (ref 78.0–100.0)
Monocytes Absolute: 1.4 10*3/uL — ABNORMAL HIGH (ref 0.1–1.0)
Monocytes Relative: 10 % (ref 3–12)
Neutro Abs: 11.4 10*3/uL — ABNORMAL HIGH (ref 1.7–7.7)
Neutrophils Relative %: 80 % — ABNORMAL HIGH (ref 43–77)
Platelets: 289 10*3/uL (ref 150–400)
RBC: 3.47 MIL/uL — ABNORMAL LOW (ref 4.22–5.81)
RDW: 15.6 % — ABNORMAL HIGH (ref 11.5–15.5)
WBC: 14.4 10*3/uL — ABNORMAL HIGH (ref 4.0–10.5)

## 2012-11-30 LAB — SYNOVIAL CELL COUNT + DIFF, W/ CRYSTALS
Eosinophils-Synovial: 0 % (ref 0–1)
Lymphocytes-Synovial Fld: 0 % (ref 0–20)
Monocyte-Macrophage-Synovial Fluid: 24 % — ABNORMAL LOW (ref 50–90)
Neutrophil, Synovial: 76 % — ABNORMAL HIGH (ref 0–25)
WBC, Synovial: 8900 /mm3 — ABNORMAL HIGH (ref 0–200)

## 2012-11-30 LAB — PHOSPHORUS: Phosphorus: 2.3 mg/dL (ref 2.3–4.6)

## 2012-11-30 LAB — GLUCOSE, CAPILLARY
Glucose-Capillary: 212 mg/dL — ABNORMAL HIGH (ref 70–99)
Glucose-Capillary: 238 mg/dL — ABNORMAL HIGH (ref 70–99)

## 2012-11-30 LAB — MAGNESIUM: Magnesium: 2 mg/dL (ref 1.5–2.5)

## 2012-11-30 LAB — CG4 I-STAT (LACTIC ACID): Lactic Acid, Venous: 1.46 mmol/L (ref 0.5–2.2)

## 2012-11-30 LAB — CREATININE, SERUM: GFR calc non Af Amer: 16 mL/min — ABNORMAL LOW (ref >90)

## 2012-11-30 MED ORDER — HYDROMORPHONE HCL PF 1 MG/ML IJ SOLN
1.0000 mg | Freq: Once | INTRAMUSCULAR | Status: AC
  Administered 2012-11-30: 1 mg via INTRAMUSCULAR
  Filled 2012-11-30: qty 1

## 2012-11-30 MED ORDER — PENTAFLUOROPROP-TETRAFLUOROETH EX AERO: 1.0000 "application " | INHALATION_SPRAY | CUTANEOUS | Status: DC | PRN

## 2012-11-30 MED ORDER — SODIUM CHLORIDE 0.9 % IV SOLN: 100.0000 mL | INTRAVENOUS | Status: DC | PRN

## 2012-11-30 MED ORDER — SODIUM CHLORIDE 0.9 % IV SOLN
INTRAVENOUS | Status: DC
  Administered 2012-11-30 – 2012-12-01 (×2): via INTRAVENOUS

## 2012-11-30 MED ORDER — GUAIFENESIN ER 600 MG PO TB12
1200.0000 mg | ORAL_TABLET | Freq: Two times a day (BID) | ORAL | Status: DC
  Administered 2012-11-30 – 2012-12-02 (×4): 1200 mg via ORAL
  Filled 2012-11-30 (×5): qty 2

## 2012-11-30 MED ORDER — ALTEPLASE 2 MG IJ SOLR
2.0000 mg | Freq: Once | INTRAMUSCULAR | Status: AC | PRN
  Filled 2012-11-30: qty 2

## 2012-11-30 MED ORDER — SODIUM CHLORIDE 0.9 % IJ SOLN
3.0000 mL | Freq: Two times a day (BID) | INTRAMUSCULAR | Status: DC
  Administered 2012-12-01 – 2012-12-02 (×3): 3 mL via INTRAVENOUS

## 2012-11-30 MED ORDER — METOPROLOL TARTRATE 25 MG PO TABS
25.0000 mg | ORAL_TABLET | Freq: Two times a day (BID) | ORAL | Status: DC
  Administered 2012-11-30 – 2012-12-02 (×4): 25 mg via ORAL
  Filled 2012-11-30 (×5): qty 1

## 2012-11-30 MED ORDER — COLCHICINE 0.6 MG PO TABS
0.6000 mg | ORAL_TABLET | ORAL | Status: AC
Start: 2012-11-30 — End: 2012-11-30
  Administered 2012-11-30: 0.6 mg via ORAL
  Filled 2012-11-30: qty 1

## 2012-11-30 MED ORDER — INSULIN ASPART 100 UNIT/ML ~~LOC~~ SOLN
0.0000 [IU] | Freq: Three times a day (TID) | SUBCUTANEOUS | Status: DC
  Administered 2012-11-30: 5 [IU] via SUBCUTANEOUS
  Administered 2012-12-01: 15 [IU] via SUBCUTANEOUS
  Administered 2012-12-01 – 2012-12-02 (×2): 5 [IU] via SUBCUTANEOUS
  Administered 2012-12-02: 3 [IU] via SUBCUTANEOUS
  Filled 2012-11-30: qty 1

## 2012-11-30 MED ORDER — LIDOCAINE-PRILOCAINE 2.5-2.5 % EX CREA: 1.0000 "application " | TOPICAL_CREAM | CUTANEOUS | Status: DC | PRN

## 2012-11-30 MED ORDER — INSULIN GLARGINE 100 UNIT/ML ~~LOC~~ SOLN
20.0000 [IU] | Freq: Every morning | SUBCUTANEOUS | Status: DC
  Administered 2012-12-01 – 2012-12-02 (×2): 20 [IU] via SUBCUTANEOUS
  Filled 2012-11-30 (×2): qty 0.2

## 2012-11-30 MED ORDER — PREDNISONE 50 MG PO TABS
50.0000 mg | ORAL_TABLET | Freq: Every day | ORAL | Status: DC
  Administered 2012-12-01 – 2012-12-02 (×2): 50 mg via ORAL
  Filled 2012-11-30 (×3): qty 1

## 2012-11-30 MED ORDER — CRANBERRY 300 MG PO TABS: 300.0000 mg | ORAL_TABLET | Freq: Two times a day (BID) | ORAL | Status: DC

## 2012-11-30 MED ORDER — PREDNISONE 20 MG PO TABS
60.0000 mg | ORAL_TABLET | Freq: Once | ORAL | Status: AC
  Administered 2012-11-30: 60 mg via ORAL
  Filled 2012-11-30: qty 3

## 2012-11-30 MED ORDER — BUPIVACAINE HCL 0.25 % IJ SOLN: 10.0000 mL | Freq: Once | INTRAMUSCULAR | Status: DC

## 2012-11-30 MED ORDER — CALCIUM CARBONATE-VITAMIN D 250-125 MG-UNIT PO TABS: 1.0000 | ORAL_TABLET | Freq: Two times a day (BID) | ORAL | Status: DC

## 2012-11-30 MED ORDER — COLCHICINE 0.6 MG PO TABS
0.6000 mg | ORAL_TABLET | Freq: Two times a day (BID) | ORAL | Status: DC
  Administered 2012-11-30 – 2012-12-02 (×4): 0.6 mg via ORAL
  Filled 2012-11-30 (×6): qty 1

## 2012-11-30 MED ORDER — LORATADINE 10 MG PO TABS
10.0000 mg | ORAL_TABLET | Freq: Every day | ORAL | Status: DC
  Administered 2012-11-30 – 2012-12-02 (×3): 10 mg via ORAL
  Filled 2012-11-30 (×3): qty 1

## 2012-11-30 MED ORDER — INSULIN ASPART 100 UNIT/ML ~~LOC~~ SOLN
3.0000 [IU] | Freq: Three times a day (TID) | SUBCUTANEOUS | Status: DC
  Administered 2012-11-30 – 2012-12-02 (×5): 3 [IU] via SUBCUTANEOUS
  Filled 2012-11-30: qty 1

## 2012-11-30 MED ORDER — LIDOCAINE HCL (PF) 1 % IJ SOLN: 5.0000 mL | INTRAMUSCULAR | Status: DC | PRN

## 2012-11-30 MED ORDER — BUPIVACAINE HCL (PF) 0.5 % IJ SOLN
10.0000 mL | Freq: Once | INTRAMUSCULAR | Status: AC
  Administered 2012-11-30: 10 mL
  Filled 2012-11-30: qty 10

## 2012-11-30 MED ORDER — HEPARIN SODIUM (PORCINE) 1000 UNIT/ML DIALYSIS
1000.0000 [IU] | INTRAMUSCULAR | Status: DC | PRN
  Filled 2012-11-30: qty 1

## 2012-11-30 MED ORDER — CALCIUM CARBONATE-VITAMIN D 500-200 MG-UNIT PO TABS
1.0000 | ORAL_TABLET | Freq: Two times a day (BID) | ORAL | Status: DC
  Administered 2012-11-30 – 2012-12-01 (×3): 1 via ORAL
  Filled 2012-11-30 (×5): qty 1

## 2012-11-30 MED ORDER — DARBEPOETIN ALFA-POLYSORBATE 25 MCG/0.42ML IJ SOLN
6.2500 ug | INTRAMUSCULAR | Status: DC
  Filled 2012-11-30: qty 0.42

## 2012-11-30 MED ORDER — NEPRO/CARBSTEADY PO LIQD: 237.0000 mL | ORAL | Status: DC | PRN

## 2012-11-30 MED ORDER — ONDANSETRON HCL 4 MG/2ML IJ SOLN
4.0000 mg | Freq: Once | INTRAMUSCULAR | Status: AC
  Administered 2012-11-30: 4 mg via INTRAVENOUS
  Filled 2012-11-30: qty 2

## 2012-11-30 MED ORDER — LEVOTHYROXINE SODIUM 112 MCG PO TABS
112.0000 ug | ORAL_TABLET | Freq: Every day | ORAL | Status: DC
  Administered 2012-11-30 – 2012-12-02 (×3): 112 ug via ORAL
  Filled 2012-11-30 (×3): qty 1

## 2012-11-30 MED ORDER — ASPIRIN 325 MG PO TABS
325.0000 mg | ORAL_TABLET | Freq: Every day | ORAL | Status: DC
  Administered 2012-12-01 – 2012-12-02 (×2): 325 mg via ORAL
  Filled 2012-11-30 (×3): qty 1

## 2012-11-30 MED ORDER — HEPARIN SODIUM (PORCINE) 5000 UNIT/ML IJ SOLN
5000.0000 [IU] | Freq: Three times a day (TID) | INTRAMUSCULAR | Status: DC
  Administered 2012-11-30 – 2012-12-02 (×5): 5000 [IU] via SUBCUTANEOUS
  Filled 2012-11-30 (×8): qty 1

## 2012-11-30 MED ORDER — HYDROMORPHONE HCL PF 1 MG/ML IJ SOLN
1.0000 mg | Freq: Once | INTRAMUSCULAR | Status: AC
  Administered 2012-11-30: 1 mg via INTRAVENOUS
  Filled 2012-11-30: qty 1

## 2012-11-30 MED ORDER — CALCIUM CARBONATE ANTACID 500 MG PO CHEW
400.0000 mg | CHEWABLE_TABLET | Freq: Three times a day (TID) | ORAL | Status: DC
  Administered 2012-12-01 – 2012-12-02 (×4): 400 mg via ORAL
  Filled 2012-11-30 (×7): qty 2

## 2012-11-30 MED ORDER — RENA-VITE PO TABS
1.0000 | ORAL_TABLET | Freq: Every day | ORAL | Status: DC
  Administered 2012-11-30 – 2012-12-02 (×3): 1 via ORAL
  Filled 2012-11-30 (×3): qty 1

## 2012-11-30 NOTE — ED Notes (Signed)
Attempted to insert heplock, unsuccessful. Reported to Dr. Juleen China.  No bleeding , bruising or swelling to site. Dressing applied

## 2012-11-30 NOTE — Progress Notes (Signed)
PHARMACIST - PHYSICIAN ORDER COMMUNICATION  CONCERNING: P&T Medication Policy on Herbal Medications  DESCRIPTION:  This patient's order for: Cranberry 300mg  tablets has been noted.  This product(s) is classified as an "herbal" or natural product. Due to a lack of definitive safety studies or FDA approval, nonstandard manufacturing practices, plus the potential risk of unknown drug-drug interactions while on inpatient medications, the Pharmacy and Therapeutics Committee does not permit the use of "herbal" or natural products of this type within [facility].   ACTION TAKEN: The pharmacy department is unable to verify this order at this time and your patient has been informed of this safety policy. Please reevaluate patient's clinical condition at discharge and address if the herbal or natural product(s) should be resumed at that time.   Thanks. Wendie Simmer, PharmD, BCPS Clinical Pharmacist  Pager: [phone]

## 2012-11-30 NOTE — Consult Note (Signed)
[facility] Renal Consultation Note    Indication for Consultation:  Management of ESRD/hemodialysis; anemia, hypertension/volume and secondary hyperparathyroidism  HPI: GRASON BRAILSFORD is a 72 y.o. male with ESRD on MWF HD at [facility] who recently started HD (May 2014 during a hospital admission for HCAP where he also had CHF in the setting of advanced CKD and was unable to be diuresed).  He wished to do PD and a PD cath was just placed 6/24 by Dr. Antonieta Pert. He has been dialyzing via a left I-J tunneled dialysis catheter (5/29) and also had a right upper AVF placed 10/11/12 by Dr. Arbie Cookey.  He had been feeling fine when his PD catheter was placed but when he went home had had a fever to 101-102 and was too ill to come to dialysis on Wednesday.  He had extreme pain in left hand, knee, vomiting without abdominal pain.  He makes a little urine. Denied diarrhea and is prone to constipation.   He has no URI sx and hasn't eaten much since running a fever. He has had gout flares before and responds well to prednisone (normally takes 1 mg bid); he previously took 300 allopurinol daily that was reduced to 200 when he started dialysis; his insurance does not cover colchicine. Pain in left knee feels better with the aspiration of fluid which showed + crystals. Pt seen- does not look toxic- no indications for HD today.   Past Medical History  Diagnosis Date  . Hypertension   . Diabetes mellitus   . Hypothyroidism   . CHF (congestive heart failure)   . Anemia   . Blind left eye   . Sleep apnea     not on CPAP  . Coronary artery disease     CABG x 11 Jun 2009.  MRSA infections of incsions  . Arthritis   . Sarcoidosis     Hx:of  . Diabetic retinopathy     Hx: of bilateral  . Edema   . Hyperlipidemia   . 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 Atherosclerosis of native arteries of the extremities, unspecified   . Eczema     Hx: of  . Insomnia   . Seasonal allergies   .  Chronic kidney disease     was on dialysis in July 2011 and then stopped and restarted may 2014  Started on Hemo 3 days  a week.   Marland Kitchen History of blood transfusion     with heart surgery   Past Surgical History  Procedure Laterality Date  . Cardiac surgery      total of 6 surgeries, 5 related to mrsa  . Coronary artery bypass graft  06/2009  . Cataract surgery      Hx: of  . Debridements      Hx: of secondary to MRSA  . Av fistula placement Right 10/11/2012    Procedure: ARTERIOVENOUS (AV) FISTULA CREATION;  Surgeon: Larina Earthly, MD;  Location: [facility] OR;  Service: Vascular;  Laterality: Right;  . Insertion of dialysis catheter Left   . Capd insertion N/A 11/28/2012    Procedure: LAPAROSCOPIC PERITONEAL DIALYSIS CATHETER PLACEMENT;  Surgeon: Axel Filler, MD;  Location: [facility] OR;  Service: General;  Laterality: N/A;   Family History  Problem Relation Age of Onset  . COPD Mother   . COPD Father    Social History:  reports that he has never smoked. He has never used smokeless tobacco. He reports that he does not  drink alcohol or use illicit drugs. Allergies  Allergen Reactions  . Flu Virus Vaccine     Gets the flu  . Lipitor (Atorvastatin)     Leg cramps   Prior to Admission medications   Medication Sig Start Date End Date Taking? Authorizing Provider  allopurinol (ZYLOPRIM) 100 MG tablet Take 1 tablet (100 mg total) by mouth 2 (two) times daily. 11/07/12  Yes Christiane Ha, MD  aspirin 325 MG tablet Take 325 mg by mouth daily.   Yes Historical Provider, MD  Calcium Carbonate-Vitamin D (CALCIUM + D PO) Take 1 tablet by mouth 2 (two) times daily.   Yes Historical Provider, MD  cetirizine (ZYRTEC) 10 MG tablet Take 10 mg by mouth daily.   Yes Historical Provider, MD  Cranberry 300 MG tablet Take 300 mg by mouth 2 (two) times daily. Hold while in hospital   Yes Historical Provider, MD  guaiFENesin (MUCINEX) 600 MG 12 hr tablet Take 1,200 mg by mouth 2 (two) times daily.   Yes  Historical Provider, MD  insulin aspart (NOVOLOG) 100 UNIT/ML injection Inject 2-8 Units into the skin 3 (three) times daily with meals. Take none if blood sugar < 150, 151-200 2 units, 201-250 4 units, 251-300 6 units, 301-350 8 units   Yes Historical Provider, MD  insulin glargine (LANTUS) 100 UNIT/ML injection Inject 0.3 mLs (30 Units total) into the skin every morning. 10/27/12  Yes Tiffany L Reed, DO  levothyroxine (SYNTHROID, LEVOTHROID) 112 MCG tablet Take 112 mcg by mouth daily.   Yes Historical Provider, MD  metoprolol tartrate (LOPRESSOR) 25 MG tablet Take 1 tablet (25 mg total) by mouth 2 (two) times daily. 11/07/12  Yes Christiane Ha, MD  oxyCODONE-acetaminophen (PERCOCET) 10-325 MG per tablet Take 1 tablet by mouth every 4 (four) hours as needed for pain. 11/28/12  Yes Axel Filler, MD  predniSONE (DELTASONE) 1 MG tablet Take 1 mg by mouth 2 (two) times daily.   Yes Historical Provider, MD   Current Facility-Administered Medications  Medication Dose Route Frequency Provider Last Rate Last Dose  . 0.9 %  sodium chloride infusion   Intravenous Continuous Raeford Razor, MD 100 mL/hr at 11/30/12 1614    . colchicine tablet 0.6 mg  0.6 mg Oral BID Rhetta Mura, MD      . insulin aspart (novoLOG) injection 0-15 Units  0-15 Units Subcutaneous TID WC Rhetta Mura, MD      . insulin aspart (novoLOG) injection 3 Units  3 Units Subcutaneous TID WC Rhetta Mura, MD      . Melene Muller ON 12/01/2012] predniSONE (DELTASONE) tablet 50 mg  50 mg Oral Q breakfast Rhetta Mura, MD       Current Outpatient Prescriptions  Medication Sig Dispense Refill  . allopurinol (ZYLOPRIM) 100 MG tablet Take 1 tablet (100 mg total) by mouth 2 (two) times daily.      Marland Kitchen aspirin 325 MG tablet Take 325 mg by mouth daily.      . Calcium Carbonate-Vitamin D (CALCIUM + D PO) Take 1 tablet by mouth 2 (two) times daily.      . cetirizine (ZYRTEC) 10 MG tablet Take 10 mg by mouth daily.      .  Cranberry 300 MG tablet Take 300 mg by mouth 2 (two) times daily. Hold while in hospital      . guaiFENesin (MUCINEX) 600 MG 12 hr tablet Take 1,200 mg by mouth 2 (two) times daily.      . insulin aspart (NOVOLOG)  100 UNIT/ML injection Inject 2-8 Units into the skin 3 (three) times daily with meals. Take none if blood sugar < 150, 151-200 2 units, 201-250 4 units, 251-300 6 units, 301-350 8 units      . insulin glargine (LANTUS) 100 UNIT/ML injection Inject 0.3 mLs (30 Units total) into the skin every morning.  10 mL  3  . levothyroxine (SYNTHROID, LEVOTHROID) 112 MCG tablet Take 112 mcg by mouth daily.      . metoprolol tartrate (LOPRESSOR) 25 MG tablet Take 1 tablet (25 mg total) by mouth 2 (two) times daily.  60 tablet  0  . oxyCODONE-acetaminophen (PERCOCET) 10-325 MG per tablet Take 1 tablet by mouth every 4 (four) hours as needed for pain.  30 tablet  0  . predniSONE (DELTASONE) 1 MG tablet Take 1 mg by mouth 2 (two) times daily.       Labs: Basic Metabolic Panel:  Recent Labs Lab 11/28/12 0611 11/30/12 0915  NA 139 135  K 4.2 4.0  CL  --  96  CO2  --  24  GLUCOSE 167* 202*  BUN  --  49*  CREATININE  --  3.35*  CALCIUM  --  9.2  PHOS  --  2.3   Liver Function Tests:  Recent Labs Lab 11/30/12 0915  AST 16  ALT <5  ALKPHOS 79  BILITOT 0.3  PROT 6.8  ALBUMIN 2.5*  CBC:  Recent Labs Lab 11/28/12 0611 11/30/12 0915  WBC  --  14.4*  NEUTROABS  --  11.4*  HGB 11.9* 10.9*  HCT 35.0* 32.8*  MCV  --  94.5  PLT  --  289  CBG:  Recent Labs Lab 11/28/12 0629 11/28/12 0902 11/30/12 1413  GLUCAP 142* 115* 212*  Studies/Results: Dg Chest 2 View  11/30/2012   *RADIOLOGY REPORT*  Clinical Data: Fever.  Pain left arm.  Diabetic and hypertensive patient.  On dialysis.  CHEST - 2 VIEW  Comparison: 11/04/2012.  Findings: Left central line in place with the tips in the region of the mid to distal superior vena cava.  No gross pneumothorax.  Cardiomegaly.  Calcified aorta.   No infiltrate, congestive heart failure or pneumothorax.  Mild degenerative changes thoracic spine.  IMPRESSION: Cardiomegaly.  Central pulmonary vascular prominence without pulmonary edema.  No segmental consolidation.   Original Report Authenticated By: Lacy Duverney, M.D.   ROS: as per HPI otherwise negative.  Physical Exam: Filed Vitals:   11/30/12 1300 11/30/12 1400 11/30/12 1414 11/30/12 1500  BP: 118/55 113/53 113/53 124/61  Pulse: 94 88 87 83  Temp:      TempSrc:      Resp: 21 20 16 21   SpO2: 92% 90% 92% 86%     General: Head: Normocephalic, atraumatic, sclera non-icteric, mucus membranes are moist; edentulous; left dentures at home; O-P no exudates Neck: Supple. JVD not elevated. Lungs: Clear bilaterally to auscultation without wheezes, rales, or rhonchi. Breathing is unlabored. Heart: RRR with S1 S2. No murmurs,  Abdomen: Obese Soft, non-tender, non-distended with normoactive bowel sounds. No rebound/guarding. PD cath dressing scant blood; exit site looks good M-S:  Strength and tone diminished for age. Extremities:    Lower:  without edema or ischemic changes, no open wounds; fungal nails; atrophy of calf muscle; swelling/tenderness of of left knee     Upper:  Swelling and tenderness in right hand and wrist;  Neuro: Alert and oriented X 3. Moves all extremities spontaneously. Psych:  Responds to questions appropriately with a normal  affect. Dialysis Access: right upper AVF + bruit and left I-J exit intact  Dialysis Orders: Center: [facility]  MWF 4 hr EDW 100 (increased due to cramping) 2 K 2.25 Ca 400/800 Optiflux 180 heparin 3300; last Hgb 11, ferritin 612 6/11, 30% sat - no Epo, no Fe no hectorol; iPTH 164 - 6/11  Assessment/Plan: 1. Exacerbation of gout - per primary prednisone increased to 50 mg; colchicine started--of note he didn't use before because his insurance wouldn't pay for it. 2. Fever - likely related to gout, but does have perm cath and new PD catheter - BC  drawn 3. ESRD -  MWF AF having just had a CAPD catheter placed 6/24 as an outpatient by Dr. Antonieta Pert; Missed Wednesday dialysis, but he has eaten little and labs are ok so will defer until Friday am.  No heparin due to recent PD cath placement.  Agree 4. Hypertension/volume  - metoprolol 25 bid and volume control 5. Anemia  - Hgb 10.9 - dose Aranesp 6.25; start venofer 100/week 6. Metabolic bone disease -  No need for hectorol; has been taking tums ex 2 ac and Ca+D bid between meals; advised could stop Ca + D; NEED TO EDIT MEDS IN AM AFTER ADMISSION 7. Nutrition - renal diet 8. DM -per pirmary 9. Hypothyroidism - on replacement therapy 10. 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 CAD with hx CABG on bb and asa  Ronny Bacon [facility] Beeper [phone] 11/30/2012, 4:19 PM   Patient seen and examined, agree with above note with above modifications. 73 year old pt known to [place], [facility] at [facility]- had PD cath placed 6/24 which appears to have exacerbated his gout.  He now is being admitted for management of gout- did have fever at home so will check blood cultures- PD cath site does not look infected.  No antibiotics yet.  No need for HD today, will plan for tomorrow- his usual day.   Annie Sable, MD 11/30/2012

## 2012-11-30 NOTE — ED Notes (Signed)
IV team paged.  

## 2012-11-30 NOTE — ED Notes (Signed)
Checked patient blood sugar it was 212 notified RN Tiffany of blood sugar, patient can not get an urine at this time will try later

## 2012-11-30 NOTE — ED Provider Notes (Signed)
History     72 year old male with pain in multiple joints. Gradual onset and progressively worsening. Pain primarily in left wrist and hand and left knee. Denies trauma. Patient has a prior history of gout. Reports the current symptoms similar to previous flares. ESRD on dialysis although does make some urine. Peritoneal dialysis catheter placed 2 days ago. HD via L subclavian cath prior to this. Last dialyzed Monday. Denies any CP or SOB. No cough. Did vomit once yesterday. No diarrhea. No interventions prior to arrival.    CSN: [id number] Arrival date & time 11/30/12  7829  First MD Initiated Contact with Patient 11/30/12 [phone]     Chief Complaint  Patient presents with  . Arm Pain   (Consider location/radiation/quality/duration/timing/severity/associated sxs/prior Treatment) HPI Past Medical History  Diagnosis Date  . Hypertension   . Diabetes mellitus   . Hypothyroidism   . CHF (congestive heart failure)   . Anemia   . Blind left eye   . Sleep apnea     not on CPAP  . Coronary artery disease   . Arthritis   . Sarcoidosis     Hx:of  . Diabetic retinopathy     Hx: of bilateral  . Edema   . Hyperlipidemia   . 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 Atherosclerosis of native arteries of the extremities, unspecified   . Eczema     Hx: of  . Insomnia   . Seasonal allergies   . Chronic kidney disease     was on dialysis in July 2011.  Started on Hemo 3 days  a week.   Marland Kitchen History of blood transfusion     with heart surgery   Past Surgical History  Procedure Laterality Date  . Cardiac surgery      total of 6 surgeries, 5 related to mrsa  . Coronary artery bypass graft  06/2009  . Cataract surgery      Hx: of  . Debridements      Hx: of secondary to MRSA  . Av fistula placement Right 10/11/2012    Procedure: ARTERIOVENOUS (AV) FISTULA CREATION;  Surgeon: Larina Earthly, MD;  Location: [facility] OR;  Service: Vascular;  Laterality: Right;  . Insertion of  dialysis catheter Left   . Capd insertion N/A 11/28/2012    Procedure: LAPAROSCOPIC PERITONEAL DIALYSIS CATHETER PLACEMENT;  Surgeon: Axel Filler, MD;  Location: [facility] OR;  Service: General;  Laterality: N/A;   Family History  Problem Relation Age of Onset  . COPD Mother   . COPD Father    History  Substance Use Topics  . Smoking status: Never Smoker   . Smokeless tobacco: Never Used  . Alcohol Use: No    Review of Systems  All systems reviewed and negative, other than as noted in HPI.   Allergies  Flu virus vaccine and Lipitor  Home Medications   Current Outpatient Rx  Name  Route  Sig  Dispense  Refill  . allopurinol (ZYLOPRIM) 100 MG tablet   Oral   Take 1 tablet (100 mg total) by mouth 2 (two) times daily.         Marland Kitchen aspirin 325 MG tablet   Oral   Take 325 mg by mouth daily.         . Calcium Carbonate-Vitamin D (CALCIUM + D PO)   Oral   Take 1 tablet by mouth 2 (two) times daily.         . cetirizine (ZYRTEC)  10 MG tablet   Oral   Take 10 mg by mouth daily.         . Cranberry 300 MG tablet   Oral   Take 300 mg by mouth 2 (two) times daily. Hold while in hospital         . guaiFENesin (MUCINEX) 600 MG 12 hr tablet   Oral   Take 1,200 mg by mouth 2 (two) times daily.         . insulin aspart (NOVOLOG) 100 UNIT/ML injection   Subcutaneous   Inject 2-8 Units into the skin 3 (three) times daily with meals. Take none if blood sugar < 150, 151-200 2 units, 201-250 4 units, 251-300 6 units, 301-350 8 units         . insulin glargine (LANTUS) 100 UNIT/ML injection   Subcutaneous   Inject 0.3 mLs (30 Units total) into the skin every morning.   10 mL   3   . levothyroxine (SYNTHROID, LEVOTHROID) 112 MCG tablet   Oral   Take 112 mcg by mouth daily.         . metoprolol tartrate (LOPRESSOR) 25 MG tablet   Oral   Take 1 tablet (25 mg total) by mouth 2 (two) times daily.   60 tablet   0   . oxyCODONE-acetaminophen (PERCOCET) 10-325 MG per  tablet   Oral   Take 1 tablet by mouth every 4 (four) hours as needed for pain.   30 tablet   0   . predniSONE (DELTASONE) 1 MG tablet   Oral   Take 1 mg by mouth 2 (two) times daily.          BP 172/62  Pulse 101  Temp(Src) 98.7 F (37.1 C) (Oral)  Resp 18  SpO2 99% Physical Exam  Nursing note and vitals reviewed. Constitutional: He is oriented to person, place, and time. He appears well-developed and well-nourished. No distress.  HENT:  Head: Normocephalic and atraumatic.  Eyes: Conjunctivae are normal. Right eye exhibits no discharge. Left eye exhibits no discharge.  Neck: Neck supple.  Cardiovascular: Normal rate, regular rhythm and normal heart sounds.  Exam reveals no gallop and no friction rub.   No murmur heard. dialysis cath L chest. Sternotomy scar and chronic appearing chest wall deformity.   Pulmonary/Chest: Effort normal and breath sounds normal. No respiratory distress.  Abdominal: Soft. He exhibits no distension. There is no tenderness.  Very mild diffuse abdominal tenderness which seems appropriate for recent procedure. Lap incisions intact and no discharge. Cathter bandages dry.   Musculoskeletal: He exhibits no edema and no tenderness.  Swelling, faint reticular erythema over L hand and wrist. Severe pain with palpation and attempted ROM of L wrist and multiple fingers. L knee TTP with increased pain with active/passive ROM. Moderate joint effusion. No overlying skin changes.   Neurological: He is alert and oriented to person, place, and time. No cranial nerve deficit. He exhibits normal muscle tone. Coordination normal.  Skin: Skin is warm and dry.  Psychiatric: He has a normal mood and affect. His behavior is normal. Thought content normal.    ED Course  ARTHOCENTESIS Date/Time: 11/30/2012 12:00 PM Performed by: Raeford Razor Authorized by: Raeford Razor Consent: Verbal consent obtained. Risks and benefits: risks, benefits and alternatives were  discussed Consent given by: patient Patient identity confirmed: verbally with patient, arm band and provided demographic data Indications: joint swelling, pain and diagnostic evaluation  Body area: knee Joint: left knee Local anesthesia used: yes  Anesthesia: local infiltration Local anesthetic: lidocaine 1% without epinephrine and bupivacaine 0.5% without epinephrine Patient sedated: no Preparation: Patient was prepped and draped in the usual sterile fashion. Needle gauge: 18 G Approach: lateral Aspirate: yellow Aspirate amount: 20 ml Bupivacaine 0.50% amount: 3 ml Lidocaine 1% amount: 3 ml Patient tolerance: Patient tolerated the procedure well with no immediate complications.    Angiocath insertion Performed by: Raeford Razor  Consent: Verbal consent obtained. Risks and benefits: risks, benefits and alternatives were discussed Time out: Immediately prior to procedure a "time out" was called to verify the correct patient, procedure, equipment, support staff and site/side marked as required.  Preparation: Patient was prepped and draped in the usual sterile fashion.  Vein Location: L antecubital Ultrasound Guided  Gauge: 20g  Normal blood return and flush without difficulty Patient tolerance: Patient tolerated the procedure well with no immediate complications.    (including critical care time) Labs Reviewed  CELL COUNT + DIFF,  W/ CRYST-SYNVL FLD - Abnormal; Notable for the following:    Color, Synovial AMBER (*)    Appearance-Synovial TURBID (*)    WBC, Synovial 8900 (*)    Neutrophil, Synovial 76 (*)    Monocyte-Macrophage-Synovial Fluid 24 (*)    All other components within normal limits  COMPREHENSIVE METABOLIC PANEL - Abnormal; Notable for the following:    Glucose, Bld 202 (*)    BUN 49 (*)    Creatinine, Ser 3.35 (*)    Albumin 2.5 (*)    GFR calc non Af Amer 17 (*)    GFR calc Af Amer 20 (*)    All other components within normal limits  CBC WITH  DIFFERENTIAL - Abnormal; Notable for the following:    WBC 14.4 (*)    RBC 3.47 (*)    Hemoglobin 10.9 (*)    HCT 32.8 (*)    RDW 15.6 (*)    Neutrophils Relative % 80 (*)    Neutro Abs 11.4 (*)    Lymphocytes Relative 10 (*)    Monocytes Absolute 1.4 (*)    All other components within normal limits  GLUCOSE, CAPILLARY - Abnormal; Notable for the following:    Glucose-Capillary 212 (*)    All other components within normal limits  GRAM STAIN  CULTURE, BLOOD (ROUTINE X 2)  CULTURE, BLOOD (ROUTINE X 2)  BODY FLUID CULTURE  MAGNESIUM  PHOSPHORUS  URINALYSIS, ROUTINE W REFLEX MICROSCOPIC  CG4 I-STAT (LACTIC ACID)   Dg Chest 2 View  11/30/2012   *RADIOLOGY REPORT*  Clinical Data: Fever.  Pain left arm.  Diabetic and hypertensive patient.  On dialysis.  CHEST - 2 VIEW  Comparison: 11/04/2012.  Findings: Left central line in place with the tips in the region of the mid to distal superior vena cava.  No gross pneumothorax.  Cardiomegaly.  Calcified aorta.  No infiltrate, congestive heart failure or pneumothorax.  Mild degenerative changes thoracic spine.  IMPRESSION: Cardiomegaly.  Central pulmonary vascular prominence without pulmonary edema.  No segmental consolidation.   Original Report Authenticated By: Lacy Duverney, M.D.   1. Gout attack   2. Renal failure   3. Physical deconditioning     MDM  71yM with pain in multiple joints. Likely gout. Hx of gout with similar symptoms. L knee aspirate consistent with this well. Tx'ing symptomatically with improved symptoms but pt unable to ambulate. Likely multifactorial with pain and also deconditioning with comorbidities and recent surgery. Discussed with Dr Janee Morn, surgery, via OR nurse concerning steroid use for gout 2  days after peritoneal dialysis cath insertion. OK with it. . Pain meds. Dose colchicine given. Hesitant to be more aggressive with it given hx hx of renal failure. Pt c/o fever yesterday but afebrile in ED and no antipyretics  prior to arrival. No source at this time, but UA still pending. Possibly related to gout flare? Abx deferred. Missed dialysis recently. Lytes ok. CXR with some vascular congestion, but no overt edema. No respiratory complaints.     Raeford Razor, MD 11/30/12 [phone]

## 2012-11-30 NOTE — Progress Notes (Signed)
Pt arrived to the unit with dx: lower ext weakness. Pt is alert and oriented x4. High fall risk. Unable to ambulate independently. Placed on telemetry. Vs stable. Complains of pain with movement in his arm. Will cont to monitor.

## 2012-11-30 NOTE — H&P (Signed)
[facility] History and Physical  Jack Chung ZOX:[id number] DOB: [birth year] DOA: 11/30/2012  Referring physician: Juleen China Edp PCP: [name], MD  Specialists: Nephrology-colodonata  Chief Complaint: Gouty flare  HPI: Jack Chung is a 72 y.o. male with ESRD M/W/F at [place] who presented to the Ed with multiple areas of severe pain consistent with his prior diagnosis of gout.  He had surgery on 6/24 and had a peritoneal catheter placed.  He ran a fever 101 and 102 on 6/24.  Was supposed to be dialysed yesterday and today and his caretaker at the Dialysis center brought him here.  No one really sick at dialysis no cough cold or diarrhea-he had a bad allergic event over the last weekend.  He was given some cetirizine and some mucinex and was fine  He has been unable to walk or do much His CBG's have been fine at the 150-250 ranges  HE has had some epioses of vomiting and couldn't hold down whatever he was given.  He has no c/o of abd pain-he did however have a sprite that he held down today in the emergency room. He usually does pass urine but has a pustule in the past 1-2 days and patient's wife called the nurse at that center recommended dose of Lasix which he took and patient finally had about 1600 out over the course of the last day  Emergency room workup revealed BUN of 49 creatinine 3.34 glucose 202 abdomen 2.5 lactic acid 1.46 rest of his hepatic function was normal Patient had leukocytosis 14.4 with predominant neutrophilia Does have a left knee effusion, ED physician His effusion and there was noted monosodium crystals  Review of Systems: The patient denies any current chills nausea vomiting diarrhea-he actually has been constipated since his recent surgery and has not felt like eating but does not endorse any abdominal pain although he did feel nauseous yesterday.   Past Medical History  Diagnosis Date  . Hypertension   . Diabetes mellitus   . Hypothyroidism    . CHF (congestive heart failure)   . Anemia   . Blind left eye   . Sleep apnea     not on CPAP  . Coronary artery disease   . Arthritis   . Sarcoidosis     Hx:of  . Diabetic retinopathy     Hx: of bilateral  . Edema   . Hyperlipidemia   . 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 Atherosclerosis of native arteries of the extremities, unspecified   . Eczema     Hx: of  . Insomnia   . Seasonal allergies   . Chronic kidney disease     was on dialysis in July 2011.  Started on Hemo 3 days  a week.   Marland Kitchen History of blood transfusion     with heart surgery   Admission 11/28/12 for CAPD Catheter placement Admission 10/30/12 with Multifocal PNA/VOl overload Admission 10/04/12 c CKD 5, Acute encephalopathy  Past Surgical History  Procedure Laterality Date  . Cardiac surgery      total of 6 surgeries, 5 related to mrsa  . Coronary artery bypass graft  06/2009  . Cataract surgery      Hx: of  . Debridements      Hx: of secondary to MRSA  . Av fistula placement Right 10/11/2012    Procedure: ARTERIOVENOUS (AV) FISTULA CREATION;  Surgeon: Larina Earthly, MD;  Location: [facility] OR;  Service: Vascular;  Laterality: Right;  .  Insertion of dialysis catheter Left   . Capd insertion N/A 11/28/2012    Procedure: LAPAROSCOPIC PERITONEAL DIALYSIS CATHETER PLACEMENT;  Surgeon: Axel Filler, MD;  Location: [facility] OR;  Service: General;  Laterality: N/A;   Social History:  reports that he has never smoked. He has never used smokeless tobacco. He reports that he does not drink alcohol or use illicit drugs. Patient lives at home alone in [place] with his wife   Allergies  Allergen Reactions  . Flu Virus Vaccine     Gets the flu  . Lipitor (Atorvastatin)     Leg cramps    Family History  Problem Relation Age of Onset  . COPD Mother   . COPD Father     Prior to Admission medications   Medication Sig Start Date End Date Taking? Authorizing Provider  allopurinol (ZYLOPRIM)  100 MG tablet Take 1 tablet (100 mg total) by mouth 2 (two) times daily. 11/07/12  Yes Christiane Ha, MD  aspirin 325 MG tablet Take 325 mg by mouth daily.   Yes Historical Provider, MD  Calcium Carbonate-Vitamin D (CALCIUM + D PO) Take 1 tablet by mouth 2 (two) times daily.   Yes Historical Provider, MD  cetirizine (ZYRTEC) 10 MG tablet Take 10 mg by mouth daily.   Yes Historical Provider, MD  Cranberry 300 MG tablet Take 300 mg by mouth 2 (two) times daily. Hold while in hospital   Yes Historical Provider, MD  guaiFENesin (MUCINEX) 600 MG 12 hr tablet Take 1,200 mg by mouth 2 (two) times daily.   Yes Historical Provider, MD  insulin aspart (NOVOLOG) 100 UNIT/ML injection Inject 2-8 Units into the skin 3 (three) times daily with meals. Take none if blood sugar < 150, 151-200 2 units, 201-250 4 units, 251-300 6 units, 301-350 8 units   Yes Historical Provider, MD  insulin glargine (LANTUS) 100 UNIT/ML injection Inject 0.3 mLs (30 Units total) into the skin every morning. 10/27/12  Yes Tiffany L Reed, DO  levothyroxine (SYNTHROID, LEVOTHROID) 112 MCG tablet Take 112 mcg by mouth daily.   Yes Historical Provider, MD  metoprolol tartrate (LOPRESSOR) 25 MG tablet Take 1 tablet (25 mg total) by mouth 2 (two) times daily. 11/07/12  Yes Christiane Ha, MD  oxyCODONE-acetaminophen (PERCOCET) 10-325 MG per tablet Take 1 tablet by mouth every 4 (four) hours as needed for pain. 11/28/12  Yes Axel Filler, MD  predniSONE (DELTASONE) 1 MG tablet Take 1 mg by mouth 2 (two) times daily.   Yes Historical Provider, MD   Physical Exam: Filed Vitals:   11/30/12 1215 11/30/12 1300 11/30/12 1400 11/30/12 1414  BP: 122/57 118/55 113/53 113/53  Pulse: 97 94 88 87  Temp:      TempSrc:      Resp: 22 21 20 16   SpO2: 90% 92% 90% 92%     General:   Alert tired-Caucasian male no apparent distressappearing  Eyes: EOMI no icterus no pallor  ENT:  Throat clear no lymphadenopathy  Neck:  no JVD or thyromegaly no  bruit   Cardiovascular:  S1-S2 no murmur or gallop   Respiratory:  clinically clear  Abdomen:  Soft nontender nondistended  Skin:  no lower extremity-significant swelling and discomfort over left knee and even more significant swelling and tenderness over right wrist   Musculoskeletal:  range of motion intact   Psychiatric:  euthymic  Neurologic:  grossly intact  Labs on Admission:  Basic Metabolic Panel:  Recent Labs Lab 11/28/12 0611 11/30/12 0915  NA 139 135  K 4.2 4.0  CL  --  96  CO2  --  24  GLUCOSE 167* 202*  BUN  --  49*  CREATININE  --  3.35*  CALCIUM  --  9.2  MG  --  2.0  PHOS  --  2.3   Liver Function Tests:  Recent Labs Lab 11/30/12 0915  AST 16  ALT <5  ALKPHOS 79  BILITOT 0.3  PROT 6.8  ALBUMIN 2.5*   No results found for this basename: LIPASE, AMYLASE,  in the last 168 hours No results found for this basename: AMMONIA,  in the last 168 hours CBC:  Recent Labs Lab 11/28/12 0611 11/30/12 0915  WBC  --  14.4*  NEUTROABS  --  11.4*  HGB 11.9* 10.9*  HCT 35.0* 32.8*  MCV  --  94.5  PLT  --  289   Cardiac Enzymes: No results found for this basename: CKTOTAL, CKMB, CKMBINDEX, TROPONINI,  in the last 168 hours  BNP (last 3 results)  Recent Labs  10/30/12 0030  PROBNP 17301.0*   CBG:  Recent Labs Lab 11/28/12 0629 11/28/12 0902 11/30/12 1413  GLUCAP 142* 115* 212*    Radiological Exams on Admission: Dg Chest 2 View  11/30/2012   *RADIOLOGY REPORT*  Clinical Data: Fever.  Pain left arm.  Diabetic and hypertensive patient.  On dialysis.  CHEST - 2 VIEW  Comparison: 11/04/2012.  Findings: Left central line in place with the tips in the region of the mid to distal superior vena cava.  No gross pneumothorax.  Cardiomegaly.  Calcified aorta.  No infiltrate, congestive heart failure or pneumothorax.  Mild degenerative changes thoracic spine.  IMPRESSION: Cardiomegaly.  Central pulmonary vascular prominence without pulmonary edema.  No  segmental consolidation.   Original Report Authenticated By: Lacy Duverney, M.D.    EKG: Independently reviewed.  sinus rhythm PR interval 0.20 QRS axis seems 45 some ST-T wave depressions in V3 V4   Assessment/Plan Principal Problem:   Acute gouty arthropathy Active Problems:   Hypotension   Anemia   CKD (chronic kidney disease)   Type II or unspecified type diabetes mellitus with renal manifestations, uncontrolled(250.42)   CHF (congestive heart failure)   Sleep apnea   1. acute gouty arthritis-patient already given a dose of prednisone 60 mg in ED which we will continue daily, also will give colchicine 0.6 mg twice a day. Will discontinue his allopurinol for now as this can actually worsened gout.  2.  end-stage renal disease Monday Wednesday Friday at [facility] nephrology to see patient and he'll probably be dialyzed in the next 1-2 days-continue calcium carbonate 1 tablet twice a day 3. Sleep apnea not on CPAP- Outpatient reassessment of the same 4. CAD with CABG x1 2011 status post multiple skin infections-currently stable at present time. Needs to be followed by cardiology eventually-might need ACE/BB etc if BP can support [note documented hypotension]. Continue aspirin 325 daily 5. Type 2 diabetes mellitus-expect that his sugars will go up given he has been on steroids at a higher dose of 60 mg up from his usual 1 mg twice a day. Nevertheless because of his PEEP poor by mouth intake I will cut back his Lantus to 20 units subcutaneously every morning from 30 units every day and keep monitor low dose sensitive sliding scale along with 3 units mealtime coverage. 6. Hypothyroidism-continue Synthroid 112 mcg daily, get interval TSH in about 3-4 weeks   nephrology consulted   Code Status:  patient confirms DO NOT RESUSCITATE status at bedside Family Communication:  stressfully with wife in room expectations of hospital stay  Disposition Plan:  inpatient expect 3-4 days     Time spent:  65 minutes   Mahala Menghini [facility] Pager [phone]   If 7PM-7AM, please contact night-coverage [email or web address] Password [facility] 11/30/2012, 3:21 PM

## 2012-11-30 NOTE — ED Notes (Signed)
Unable to get Iv access, reported to Dr. Juleen China.  Only area to work with is his lt. Hand.  Lt. Wrist is swollen and red and pt. Is guarding the lt. Wrist.    Attempted to insert heplock into lt. Hand unsuccessful, unable to advance catheter.  No bleeding , bruising or swelling noted. Dressing applied. Reported to Dr. Juleen China.

## 2012-11-30 NOTE — ED Notes (Addendum)
Lt. Arm pain and lt. Leg pain began  Tuesday. Swelling and redness at the lt. Hand/ wrist. hx of gout.  Pt. Had surgery to prepare pt. For peritoneal dialysis at home. Pt. Has a catheter in his lt. Lower abdominal area.   Dressing intact no bleeding or drainage noted.  Lt. Knee pain denies any injury , no swelling or redness noted. Decreased ROM to knee due to pain.  Pt. Had  Renal dialysis on Monday, missed his treatment yesterday.  Lt. Subclavian catheter intact, no bleeding or drainage  Noted to that catheter.  Pt. Is alert and oriented X4.  Severe vomiting last night and fever

## 2012-11-30 NOTE — ED Notes (Signed)
Attempt to ambulate pt unsuccessful. Pt states he is unable to bear weight on his left leg, it was too painful to stand up.

## 2012-11-30 NOTE — ED Notes (Signed)
Pt. Is aware of needing urine specimen.   

## 2012-12-01 DIAGNOSIS — N186 End stage renal disease: Secondary | ICD-10-CM

## 2012-12-01 DIAGNOSIS — N19 Unspecified kidney failure: Secondary | ICD-10-CM

## 2012-12-01 DIAGNOSIS — E1165 Type 2 diabetes mellitus with hyperglycemia: Secondary | ICD-10-CM

## 2012-12-01 LAB — COMPREHENSIVE METABOLIC PANEL
AST: 16 U/L (ref 0–37)
Albumin: 2.4 g/dL — ABNORMAL LOW (ref 3.5–5.2)
Alkaline Phosphatase: 80 U/L (ref 39–117)
BUN: 65 mg/dL — ABNORMAL HIGH (ref 6–23)
CO2: 23 mEq/L (ref 19–32)
Chloride: 95 mEq/L — ABNORMAL LOW (ref 96–112)
GFR calc non Af Amer: 16 mL/min — ABNORMAL LOW (ref >90)
Potassium: 4.7 mEq/L (ref 3.5–5.1)
Total Bilirubin: 0.2 mg/dL — ABNORMAL LOW (ref 0.3–1.2)

## 2012-12-01 LAB — GLUCOSE, CAPILLARY: Glucose-Capillary: 448 mg/dL — ABNORMAL HIGH (ref 70–99)

## 2012-12-01 LAB — CBC
MCH: 31.1 pg (ref 26.0–34.0)
MCV: 95.5 fL (ref 78.0–100.0)
Platelets: 281 10*3/uL (ref 150–400)
RDW: 15.5 % (ref 11.5–15.5)
WBC: 14.5 10*3/uL — ABNORMAL HIGH (ref 4.0–10.5)

## 2012-12-01 LAB — PROTIME-INR: Prothrombin Time: 14.2 seconds (ref 11.6–15.2)

## 2012-12-01 MED ORDER — INSULIN ASPART 100 UNIT/ML ~~LOC~~ SOLN
10.0000 [IU] | Freq: Once | SUBCUTANEOUS | Status: AC
  Administered 2012-12-01: 10 [IU] via SUBCUTANEOUS

## 2012-12-01 MED ORDER — DARBEPOETIN ALFA-POLYSORBATE 100 MCG/0.5ML IJ SOLN
INTRAMUSCULAR | Status: AC
  Filled 2012-12-01: qty 0.5

## 2012-12-01 MED ORDER — DARBEPOETIN ALFA-POLYSORBATE 100 MCG/0.5ML IJ SOLN
100.0000 ug | INTRAMUSCULAR | Status: DC
  Administered 2012-12-01: 100 ug via INTRAVENOUS

## 2012-12-01 NOTE — Progress Notes (Signed)
Mr Jack Chung is active with [facility] Care Management services. Met with Mr Jack Chung and wife to make aware that [facility] Care Management will continue to follow post discharge. Will receive post transition of care call along and monthly home visit if needed. Raiford Noble, MSN- Ed, RN,BSN- [facility] [phone]

## 2012-12-01 NOTE — Progress Notes (Signed)
[facility] PROGRESS NOTE  Jack Chung ZOX:[id number] DOB: 1941-01-13 DOA: 11/30/2012 PCP: [name], MD  Assessment/Plan: acute gouty arthritis-patient already given a dose of prednisone 60 mg in ED which we will continue daily, also will give colchicine 0.6 mg twice a day. Will discontinue his allopurinol for now   Fevers- resolved but BC x 2 pending  end-stage renal disease Monday Wednesday Friday at [facility]-  Sleep apnea not on CPAP- Outpatient reassessment of the same   CAD with CABG x1 2011 status post multiple skin infections-currently stable at present time. Needs to be followed by cardiology eventually-might need ACE/BB etc if BP can support [note documented hypotension]. Continue aspirin 325 daily   Type 2 diabetes mellitus-expect that his sugars will go up given he has been on steroids at a higher dose of 60 mg up from his usual 1 mg twice a day. Nevertheless because of his poor by mouth intake  cut back his Lantus to 20 units subcutaneously every morning from 30 units every day and keep monitor low dose sensitive sliding scale along with 3 units mealtime coverage.   Hypothyroidism-continue Synthroid 112 mcg daily, get interval TSH in about 3-4 weeks   Code Status: DNR Family Communication: patient at bedside Disposition Plan:    Consultants:  nephro  Procedures:  HD  Antibiotics:    HPI/Subjective: Feeling better- able to move left arm/hand more, swelling decreased No CP, no SOB   Objective: Filed Vitals:   12/01/12 0900 12/01/12 0930 12/01/12 0958 12/01/12 1030  BP: 114/52 105/56 112/53 122/59  Pulse: 62 66 70 72  Temp:      TempSrc:      Resp: 19 20 20 19   Weight:      SpO2:        Intake/Output Summary (Last 24 hours) at 12/01/12 1056 Last data filed at 12/01/12 0300  Gross per 24 hour  Intake 1316.67 ml  Output      0 ml  Net 1316.67 ml   Filed Weights   11/30/12 2215 12/01/12 0734  Weight: 101.6 kg (223 lb 15.8  oz) 102 kg (224 lb 13.9 oz)    Exam:   General:  A+Ox3, NAD  Cardiovascular: rrr  Respiratory: clear anterior  Abdomen: +BS, soft  Musculoskeletal: left hand more movement then before, decreased swelling  Data Reviewed: Basic Metabolic Panel:  Recent Labs Lab 11/28/12 0611 11/30/12 0915 11/30/12 1837 12/01/12 0525  NA 139 135  --  132*  K 4.2 4.0  --  4.7  CL  --  96  --  95*  CO2  --  24  --  23  GLUCOSE 167* 202*  --  315*  BUN  --  49*  --  65*  CREATININE  --  3.35* 3.56* 3.66*  CALCIUM  --  9.2  --  9.1  MG  --  2.0  --   --   PHOS  --  2.3  --   --    Liver Function Tests:  Recent Labs Lab 11/30/12 0915 12/01/12 0525  AST 16 16  ALT <5 <5  ALKPHOS 79 80  BILITOT 0.3 0.2*  PROT 6.8 7.0  ALBUMIN 2.5* 2.4*   No results found for this basename: LIPASE, AMYLASE,  in the last 168 hours No results found for this basename: AMMONIA,  in the last 168 hours CBC:  Recent Labs Lab 11/28/12 0611 11/30/12 0915 11/30/12 1837 12/01/12 0525  WBC  --  14.4* 12.6* 14.5*  NEUTROABS  --  11.4*  --   --   HGB 11.9* 10.9* 10.4* 10.3*  HCT 35.0* 32.8* 32.4* 31.6*  MCV  --  94.5 95.6 95.5  PLT  --  289 296 281   Cardiac Enzymes: No results found for this basename: CKTOTAL, CKMB, CKMBINDEX, TROPONINI,  in the last 168 hours BNP (last 3 results)  Recent Labs  10/30/12 0030  PROBNP 17301.0*   CBG:  Recent Labs Lab 11/28/12 0629 11/28/12 0902 11/30/12 1413 11/30/12 1630  GLUCAP 142* 115* 212* 238*    Recent Results (from the past 240 hour(s))  SURGICAL PCR SCREEN     Status: None   Collection Time    11/23/12 10:24 AM      Result Value Range Status   MRSA, PCR NEGATIVE  NEGATIVE Final   Staphylococcus aureus NEGATIVE  NEGATIVE Final   Comment:            The Xpert SA Assay (FDA     approved for NASAL specimens     in patients over 60 years of age),     is one component of     a comprehensive surveillance     program.  Test performance has      been validated by [employer] for patients greater     than or equal to 93 year old.     It is not intended     to diagnose infection nor to     guide or monitor treatment.  CULTURE, BLOOD (ROUTINE X 2)     Status: None   Collection Time    11/30/12  9:00 AM      Result Value Range Status   Specimen Description BLOOD HAND LEFT   Final   Special Requests BOTTLES DRAWN AEROBIC ONLY 8CC   Final   Culture  Setup Time 11/30/2012 14:48   Final   Culture     Final   Value:        BLOOD CULTURE RECEIVED NO GROWTH TO DATE CULTURE WILL BE HELD FOR 5 DAYS BEFORE ISSUING A FINAL NEGATIVE REPORT   Report Status PENDING   Incomplete  CULTURE, BLOOD (ROUTINE X 2)     Status: None   Collection Time    11/30/12  9:05 AM      Result Value Range Status   Specimen Description BLOOD LEFT FOREARM   Final   Special Requests BOTTLES DRAWN AEROBIC ONLY 6CC   Final   Culture  Setup Time 11/30/2012 14:48   Final   Culture     Final   Value:        BLOOD CULTURE RECEIVED NO GROWTH TO DATE CULTURE WILL BE HELD FOR 5 DAYS BEFORE ISSUING A FINAL NEGATIVE REPORT   Report Status PENDING   Incomplete  BODY FLUID CULTURE     Status: None   Collection Time    11/30/12 11:15 AM      Result Value Range Status   Specimen Description SYNOVIAL FLUID KNEE LEFT   Final   Special Requests FLUID   Final   Gram Stain     Final   Value: ABUNDANT WBC PRESENT, PREDOMINANTLY PMN     NO ORGANISMS SEEN     Performed at [facility]   Culture NO GROWTH 1 DAY   Final   Report Status PENDING   Incomplete  GRAM STAIN     Status: None   Collection Time  11/30/12 11:15 AM      Result Value Range Status   Specimen Description SYNOVIAL FLUID KNEE LEFT   Final   Special Requests FLUID   Final   Gram Stain     Final   Value: ABUNDANT WBC PRESENT, PREDOMINANTLY PMN     NO ORGANISMS SEEN   Report Status 11/30/2012 FINAL   Final     Studies: Dg Chest 2 View  11/30/2012   *RADIOLOGY REPORT*  Clinical Data:  Fever.  Pain left arm.  Diabetic and hypertensive patient.  On dialysis.  CHEST - 2 VIEW  Comparison: 11/04/2012.  Findings: Left central line in place with the tips in the region of the mid to distal superior vena cava.  No gross pneumothorax.  Cardiomegaly.  Calcified aorta.  No infiltrate, congestive heart failure or pneumothorax.  Mild degenerative changes thoracic spine.  IMPRESSION: Cardiomegaly.  Central pulmonary vascular prominence without pulmonary edema.  No segmental consolidation.   Original Report Authenticated By: Lacy Duverney, M.D.    Scheduled Meds: . aspirin  325 mg Oral Daily  . calcium carbonate  400 mg of elemental calcium Oral TID WC  . calcium-vitamin D  1 tablet Oral BID  . colchicine  0.6 mg Oral BID  . darbepoetin      . darbepoetin (ARANESP) injection - DIALYSIS  100 mcg Intravenous Q Fri-HD  . guaiFENesin  1,200 mg Oral BID  . heparin  5,000 Units Subcutaneous Q8H  . insulin aspart  0-15 Units Subcutaneous TID WC  . insulin aspart  3 Units Subcutaneous TID WC  . insulin glargine  20 Units Subcutaneous q morning - 10a  . levothyroxine  112 mcg Oral Daily  . loratadine  10 mg Oral Daily  . metoprolol tartrate  25 mg Oral BID  . multivitamin  1 tablet Oral Daily  . predniSONE  50 mg Oral Q breakfast  . sodium chloride  3 mL Intravenous Q12H   Continuous Infusions:   Principal Problem:   Acute gouty arthropathy Active Problems:   Hypotension   Anemia   CKD (chronic kidney disease)   Type II or unspecified type diabetes mellitus with renal manifestations, uncontrolled(250.42)   CHF (congestive heart failure)   Sleep apnea    Time spent: 35    [facility], Azyria Osmon  [facility] Pager [phone]. If 7PM-7AM, please contact night-coverage at [email or web address], password [facility] 12/01/2012, 10:56 AM  LOS: 1 day

## 2012-12-01 NOTE — Procedures (Signed)
Pt seen on HD.  Ap 210 Vp 260.  Feels better but still with pain in Lt knee and elbow.  No further fevers.  BC pending as he does have a catheter.

## 2012-12-02 LAB — GLUCOSE, CAPILLARY: Glucose-Capillary: 219 mg/dL — ABNORMAL HIGH (ref 70–99)

## 2012-12-02 MED ORDER — PREDNISONE 10 MG PO TABS: 10.0000 mg | ORAL_TABLET | Freq: Every day | ORAL | Status: DC

## 2012-12-02 NOTE — Progress Notes (Signed)
[facility] Progress Note  Subjective:   Pain improving. Can stand/walk. ROM to LUE much better but still unable to make a fist. Wants IV removed.  Objective Filed Vitals:   12/01/12 1130 12/01/12 1158 12/01/12 2153 12/02/12 0503  BP: 104/59 112/61 142/64 137/60  Pulse: 73 81 77 64  Temp:  98.7 F (37.1 C) 98.8 F (37.1 C) 97.6 F (36.4 C)  TempSrc:  Oral Oral Oral  Resp: 16 15 16 18   Height:   5\' 10"  (1.778 m)   Weight:   100.4 kg (221 lb 5.5 oz)   SpO2: 93% 94% 96% 97%   Physical Exam General: Alert, cooperative, NAD Heart: RRR Lungs: CTA bilaterally. No wheezes, rales, rhonchi noted Abdomen: Soft, NT, PD cath exit site without erythema or exudate Extremities: Trace LE edema Dialysis Access: RUA AVF, L chest TDC, PD cath  Assessment/Plan: 1. Exacerbation of gout - per primary prednisone daily + colchicine. Allopurinol d/c'd  2. Fever - Resolved. But does have perm cath and new PD catheter - BC's pending - will follow outpatient 3. ESRD - MWF AF having just had a CAPD catheter placed 6/24 as an outpatient by Dr. Antonieta Pert; No heparin due to recent PD cath placement. K 4.7. Next HD Monday at op center. 4. Hypertension/volume - Continue metoprolol 25 bid and volume control 5. Anemia - Hgb 10.3 -  Aranesp 100 and weekly IV FE. Notify op dialysis unit. 6. Metabolic bone disease - No need for hectorol; has been taking tums ex 2 ac,  Ca+D bid  D/c'd 7. Nutrition - renal diet 8. DM -per pirmary 9. Hypothyroidism - on replacement therapy 10. 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 CAD with hx CABG on bb and asa 12. Dispo - Ok for discharge home today from renal standpoint.  Follow BC's. Weekly IV FE and no heparin on HD   Karen E. Thad Ranger [facility] Pager [phone] 12/02/2012,8:54 AM  LOS: 2 days  I have seen and examined this patient and agree with plan per Claud Kelp.  Gout pain better and able to ambulate.  He wants to go home.  Afebrile since  admission.  OK for DC and we can FU on [facility] results and the dx unit.Marland Kitchen Anais Koenen T,MD 12/02/2012 11:05 AM  Additional Objective Labs: Basic Metabolic Panel:  Recent Labs Lab 11/28/12 0611 11/30/12 0915 11/30/12 1837 12/01/12 0525  NA 139 135  --  132*  K 4.2 4.0  --  4.7  CL  --  96  --  95*  CO2  --  24  --  23  GLUCOSE 167* 202*  --  315*  BUN  --  49*  --  65*  CREATININE  --  3.35* 3.56* 3.66*  CALCIUM  --  9.2  --  9.1  PHOS  --  2.3  --   --    Liver Function Tests:  Recent Labs Lab 11/30/12 0915 12/01/12 0525  AST 16 16  ALT <5 <5  ALKPHOS 79 80  BILITOT 0.3 0.2*  PROT 6.8 7.0  ALBUMIN 2.5* 2.4*   No results found for this basename: LIPASE, AMYLASE,  in the last 168 hours CBC:  Recent Labs Lab 11/30/12 0915 11/30/12 1837 12/01/12 0525  WBC 14.4* 12.6* 14.5*  NEUTROABS 11.4*  --   --   HGB 10.9* 10.4* 10.3*  HCT 32.8* 32.4* 31.6*  MCV 94.5 95.6 95.5  PLT 289 296 281   Blood Culture    Component  Value Date/Time   SDES SYNOVIAL FLUID KNEE LEFT 11/30/2012 1115   SDES SYNOVIAL FLUID KNEE LEFT 11/30/2012 1115   SPECREQUEST FLUID 11/30/2012 1115   SPECREQUEST FLUID 11/30/2012 1115   CULT NO GROWTH 1 DAY 11/30/2012 1115   REPTSTATUS PENDING 11/30/2012 1115   REPTSTATUS 11/30/2012 FINAL 11/30/2012 1115    Cardiac Enzymes: No results found for this basename: CKTOTAL, CKMB, CKMBINDEX, TROPONINI,  in the last 168 hours CBG:  Recent Labs Lab 11/30/12 1630 12/01/12 1222 12/01/12 1703 12/01/12 2151 12/02/12 0808  GLUCAP 238* 206* 404* 448* 180*   Iron Studies: No results found for this basename: IRON, TIBC, TRANSFERRIN, FERRITIN,  in the last 72 hours @lablastinr3 @ Studies/Results: Dg Chest 2 View  11/30/2012   *RADIOLOGY REPORT*  Clinical Data: Fever.  Pain left arm.  Diabetic and hypertensive patient.  On dialysis.  CHEST - 2 VIEW  Comparison: 11/04/2012.  Findings: Left central line in place with the tips in the region of the mid to  distal superior vena cava.  No gross pneumothorax.  Cardiomegaly.  Calcified aorta.  No infiltrate, congestive heart failure or pneumothorax.  Mild degenerative changes thoracic spine.  IMPRESSION: Cardiomegaly.  Central pulmonary vascular prominence without pulmonary edema.  No segmental consolidation.   Original Report Authenticated By: Lacy Duverney, M.D.   Medications:   . aspirin  325 mg Oral Daily  . calcium carbonate  400 mg of elemental calcium Oral TID WC  . calcium-vitamin D  1 tablet Oral BID  . colchicine  0.6 mg Oral BID  . darbepoetin (ARANESP) injection - DIALYSIS  100 mcg Intravenous Q Fri-HD  . guaiFENesin  1,200 mg Oral BID  . heparin  5,000 Units Subcutaneous Q8H  . insulin aspart  0-15 Units Subcutaneous TID WC  . insulin aspart  3 Units Subcutaneous TID WC  . insulin glargine  20 Units Subcutaneous q morning - 10a  . levothyroxine  112 mcg Oral Daily  . loratadine  10 mg Oral Daily  . metoprolol tartrate  25 mg Oral BID  . multivitamin  1 tablet Oral Daily  . predniSONE  50 mg Oral Q breakfast  . sodium chloride  3 mL Intravenous Q12H

## 2012-12-02 NOTE — Progress Notes (Signed)
Patient discharge Home per Md order.  Discharge instructions reviewed with patient and family.  Copies of all forms given and explained. Patient/family voiced understanding of all instructions.  Discharge in no acute distress. Eliceo Gladu B. Laticia Vannostrand, RN BC, BSN, MSN 

## 2012-12-02 NOTE — Discharge Summary (Signed)
Physician Discharge Summary  ALIAS VILLAGRAN ZOX:[id number] DOB: 01-04-1941 DOA: 11/30/2012  PCP: [name], MD  Admit date: 11/30/2012 Discharge date: 12/02/2012  Time spent: 45 minutes  Recommendations for Outpatient Follow-up:  1. Follow up with HD as previously scheduled   Recommendations for primary care physician for things to follow:  Follow up on final reports for blood cultures and knee aspirate cultures  Discharge Diagnoses:  Principal Problem:   Acute gouty arthropathy Active Problems:   Hypotension   Anemia   CKD (chronic kidney disease)   Type II or unspecified type diabetes mellitus with renal manifestations, uncontrolled(250.42)   CHF (congestive heart failure)   Sleep apnea  Discharge Condition: stable  Diet recommendation: renal  Filed Weights   11/30/12 2215 12/01/12 0734 12/01/12 2153  Weight: 101.6 kg (223 lb 15.8 oz) 102 kg (224 lb 13.9 oz) 100.4 kg (221 lb 5.5 oz)    History of present illness:  Jack Chung is a 71 y.o. male with ESRD M/W/F at [place] who presented to the Ed with multiple areas of severe pain consistent with his prior diagnosis of gout. He had surgery on 6/24 and had a peritoneal catheter placed. He ran a fever 101 and 102 on 6/24. Was supposed to be dialysed yesterday and today and his caretaker at the Dialysis center brought him here. No one really sick at dialysis no cough cold or diarrhea-he had a bad allergic event over the last weekend. He was given some cetirizine and some mucinex and was fine. He has been unable to walk or do much His CBG's have been fine at the 150-250 ranges HE has had some epioses of vomiting and couldn't hold down whatever he was given. He has no c/o of abd pain-he did however have a sprite that he held down today in the emergency room. He usually does pass urine but has a pustule in the past 1-2 days and patient's wife called the nurse at that center recommended dose of Lasix which he took and patient  finally had about 1600 out over the course of the last day Emergency room workup revealed BUN of 49 creatinine 3.34 glucose 202 abdomen 2.5 lactic acid 1.46 rest of his hepatic function was normal Patient had leukocytosis 14.4 with predominant neutrophilia Does have a left knee effusion, ED physician drained His effusion and there was noted monosodium crystals  Hospital Course:  Acute gouty arthritis-patient already given a dose of prednisone 60 mg in ED and was continued at 50 mg daily here with significant improvement in his arthritic pain and joint mobility. Will be discharged on a prednisone taper until he reaches his previous home dose of chronic steroids. No growth from knee aspirate as of today.  Fevers- resolved, likely due to his gouty arthritis. Cultures now negative for 48 hours and patient insists that he goes home today. Afebrile now and clinically improved since steroids were started. Will follow up with the blood cultures and will have cultures reviewed again with his dialysis.  End-stage renal disease Monday Wednesday Friday at [facility] Sleep apnea not on CPAP- Outpatient reassessment of the same  CAD with CABG x1 2011 status post multiple skin infections-currently stable at present time. Needs to be followed by cardiology eventually-might need ACE/BB etc if BP can support [note documented hypotension]. Continue aspirin 325 daily  Type 2 diabetes mellitus-expect that his sugars will go up given he has been on steroids at a higher dose. To resume home regimen including Lantus and SSI  on discharge.  Hypothyroidism-continue Synthroid 112 mcg daily, get interval TSH in about 3-4 weeks  Procedures:  none   Consultations:  Nephrology  Discharge Exam: Filed Vitals:   12/01/12 1158 12/01/12 2153 12/02/12 0503 12/02/12 1021  BP: 112/61 142/64 137/60 136/72  Pulse: 81 77 64 66  Temp: 98.7 F (37.1 C) 98.8 F (37.1 C) 97.6 F (36.4 C) 98.3 F (36.8 C)  TempSrc: Oral Oral Oral  Oral  Resp: 15 16 18    Height:  5\' 10"  (1.778 m)    Weight:  100.4 kg (221 lb 5.5 oz)    SpO2: 94% 96% 97% 98%   General: NAD Cardiovascular: RRR Respiratory: CTA biL  Discharge Instructions   Future Appointments Provider Department Dept Phone   12/07/2012 1:30 PM Carita Pian [facility] [place] CARE [phone]   12/19/2012 11:00 AM Axel Filler, MD [facility] [place] [phone]       Medication List         allopurinol 100 MG tablet  Commonly known as:  ZYLOPRIM  Take 1 tablet (100 mg total) by mouth 2 (two) times daily.     aspirin 325 MG tablet  Take 325 mg by mouth daily.     CALCIUM + D PO  Take 1 tablet by mouth 2 (two) times daily.     cetirizine 10 MG tablet  Commonly known as:  ZYRTEC  Take 10 mg by mouth daily.     Cranberry 300 MG tablet  Take 300 mg by mouth 2 (two) times daily. Hold while in hospital     guaiFENesin 600 MG 12 hr tablet  Commonly known as:  MUCINEX  Take 1,200 mg by mouth 2 (two) times daily.     insulin aspart 100 UNIT/ML injection  Commonly known as:  novoLOG  Inject 2-8 Units into the skin 3 (three) times daily with meals. Take none if blood sugar < 150, 151-200 2 units, 201-250 4 units, 251-300 6 units, 301-350 8 units     insulin glargine 100 UNIT/ML injection  Commonly known as:  LANTUS  Inject 0.3 mLs (30 Units total) into the skin every morning.     levothyroxine 112 MCG tablet  Commonly known as:  SYNTHROID, LEVOTHROID  Take 112 mcg by mouth daily.     metoprolol tartrate 25 MG tablet  Commonly known as:  LOPRESSOR  Take 1 tablet (25 mg total) by mouth 2 (two) times daily.     oxyCODONE-acetaminophen 10-325 MG per tablet  Commonly known as:  PERCOCET  Take 1 tablet by mouth every 4 (four) hours as needed for pain.     predniSONE 10 MG tablet  Commonly known as:  DELTASONE  Take 1 tablet (10 mg total) by mouth daily. 2 tablets twice daily for 4 days then 1 tablet twice daily for 4 days then 1 tablet  daily for 4 days then resume previous dose.     predniSONE 1 MG tablet  Commonly known as:  DELTASONE  Take 1 mg by mouth 2 (two) times daily.         The results of significant diagnostics from this hospitalization (including imaging, microbiology, ancillary and laboratory) are listed below for reference.    Significant Diagnostic Studies: Dg Chest 2 View  11/30/2012   *RADIOLOGY REPORT*  Clinical Data: Fever.  Pain left arm.  Diabetic and hypertensive patient.  On dialysis.  CHEST - 2 VIEW  Comparison: 11/04/2012.  Findings: Left central line in place with the tips  in the region of the mid to distal superior vena cava.  No gross pneumothorax.  Cardiomegaly.  Calcified aorta.  No infiltrate, congestive heart failure or pneumothorax.  Mild degenerative changes thoracic spine.  IMPRESSION: Cardiomegaly.  Central pulmonary vascular prominence without pulmonary edema.  No segmental consolidation.   Original Report Authenticated By: Lacy Duverney, M.D.   Dg Chest 2 View  11/05/2012   *RADIOLOGY REPORT*  Clinical Data: Shortness of breath.  Sarcoidosis. Chronic renal failure.  Status post dialysis.  CHEST - 2 VIEW  Comparison: 10/31/2012  Findings: There has been resolution of bilateral perihilar infiltrates since previous study, consistent with resolving pulmonary edema.  No acute infiltrates are seen.  Mild scarring noted at the left lung base.  Heart size is within normal limits. The left internal jugular dual lumen center venous catheter is seen in appropriate position.  No pneumothorax identified.  IMPRESSION:  1.  Resolution of bilateral perihilar infiltrates/edema since prior exam. 2.  New left jugular dialysis catheter in appropriate position.  No pneumothorax identified.   Original Report Authenticated By: [name], M.D.   Ir Fluoro Guide Cv Line Left  11/02/2012   *RADIOLOGY REPORT*  Indication: 72 year old with end-stage renal disease.  Recently placed right arm fistula.  The patient needs  hemodialysis until the fistula mature.  PROCEDURE(S): FLUOROSCOPIC AND ULTRASOUND GUIDED PLACEMENT OF A TUNNELED DIAYSIS CATHETER  Physician:  Rachelle Hora. Henn, MD  Medications: Versed 2 mg, Fentanyl 100 mcg. A radiology nurse monitored the patient for moderate sedation.  As antibiotic prophylaxis, Ancef  was ordered pre-procedure and administered intravenously within one hour of incision.  Moderate sedation time: 43 minutes  Fluoroscopy time: 6 minutes and 36 seconds  Procedure:Informed consent was obtained for placement of a tunneled dialysis catheter.  The patient was placed supine on the interventional table.  The left side of the chest was selected due to the recently placed right arm fistula.  Ultrasound confirmed a patent left internal jugularvein.  Ultrasound images were obtained for documentation.  The left side of the neck was prepped and draped in a sterile fashion.  The left side of the neck was anesthetized with 1% lidocaine.  Maximal barrier sterile technique was utilized including caps, mask, sterile gowns, sterile gloves, sterile drape, hand hygiene and skin antiseptic.  A small incision was made with #11 blade scalpel.  A 21 gauge needle directed into the left internal jugular vein with ultrasound guidance.  A micropuncture dilator set was placed. The wire would not easily advance into the SVC.  A Kumpe catheter and a Bentson wire used to cannulate the SVC.  A 27 cm tip to cuff HemoSplit catheter was selected.  The skin below the left clavicle was anesthetized and a small incision was made with an #11 blade scalpel.  A subcutaneous tunnel was formed to the vein dermatotomy site.  The catheter was brought through the tunnel.  The vein dermatotomy site was dilated to accommodate a peel-away sheath.  The catheter was placed through the peel-away sheath and directed into the central venous structures. The catheter preferentially went into the right innominate vein.  Catheter was pulled back and directed  into the SVC with stiff Glidewires and stiff Amplatz wires.  Redirecting the catheter into the SVC was very tedious and difficult.  Eventually, the tip of the catheter was placed in the lower SVC.  Both lumens aspirated and flushed well.  Fluoroscopic images were obtained for documentation.   The proper amount of heparin was flushed  in both lumens.  The vein dermatotomy site was closed using a single layer of absorbable suture and Dermabond.  The catheter was secured to the skin using Prolene suture.  Findings:Catheter tip in the superior vena cava.  Complications: None  Impression:Successful placement of a left jugular tunneled dialysis catheter using ultrasound and fluoroscopic guidance.   Original Report Authenticated By: Richarda Overlie, M.D.   Ir US Guide Vasc Access Left  11/02/2012   *RADIOLOGY REPORT*  Indication: 72 year old with end-stage renal disease.  Recently placed right arm fistula.  The patient needs hemodialysis until the fistula mature.  PROCEDURE(S): FLUOROSCOPIC AND ULTRASOUND GUIDED PLACEMENT OF A TUNNELED DIAYSIS CATHETER  Physician:  Rachelle Hora. Henn, MD  Medications: Versed 2 mg, Fentanyl 100 mcg. A radiology nurse monitored the patient for moderate sedation.  As antibiotic prophylaxis, Ancef  was ordered pre-procedure and administered intravenously within one hour of incision.  Moderate sedation time: 43 minutes  Fluoroscopy time: 6 minutes and 36 seconds  Procedure:Informed consent was obtained for placement of a tunneled dialysis catheter.  The patient was placed supine on the interventional table.  The left side of the chest was selected due to the recently placed right arm fistula.  Ultrasound confirmed a patent left internal jugularvein.  Ultrasound images were obtained for documentation.  The left side of the neck was prepped and draped in a sterile fashion.  The left side of the neck was anesthetized with 1% lidocaine.  Maximal barrier sterile technique was utilized including caps, mask,  sterile gowns, sterile gloves, sterile drape, hand hygiene and skin antiseptic.  A small incision was made with #11 blade scalpel.  A 21 gauge needle directed into the left internal jugular vein with ultrasound guidance.  A micropuncture dilator set was placed. The wire would not easily advance into the SVC.  A Kumpe catheter and a Bentson wire used to cannulate the SVC.  A 27 cm tip to cuff HemoSplit catheter was selected.  The skin below the left clavicle was anesthetized and a small incision was made with an #11 blade scalpel.  A subcutaneous tunnel was formed to the vein dermatotomy site.  The catheter was brought through the tunnel.  The vein dermatotomy site was dilated to accommodate a peel-away sheath.  The catheter was placed through the peel-away sheath and directed into the central venous structures. The catheter preferentially went into the right innominate vein.  Catheter was pulled back and directed into the SVC with stiff Glidewires and stiff Amplatz wires.  Redirecting the catheter into the SVC was very tedious and difficult.  Eventually, the tip of the catheter was placed in the lower SVC.  Both lumens aspirated and flushed well.  Fluoroscopic images were obtained for documentation.   The proper amount of heparin was flushed in both lumens.  The vein dermatotomy site was closed using a single layer of absorbable suture and Dermabond.  The catheter was secured to the skin using Prolene suture.  Findings:Catheter tip in the superior vena cava.  Complications: None  Impression:Successful placement of a left jugular tunneled dialysis catheter using ultrasound and fluoroscopic guidance.   Original Report Authenticated By: Richarda Overlie, M.D.    Microbiology: Recent Results (from the past 240 hour(s))  SURGICAL PCR SCREEN     Status: None   Collection Time    11/23/12 10:24 AM      Result Value Range Status   MRSA, PCR NEGATIVE  NEGATIVE Final   Staphylococcus aureus NEGATIVE  NEGATIVE Final  Comment:            The Xpert SA Assay (FDA     approved for NASAL specimens     in patients over 14 years of age),     is one component of     a comprehensive surveillance     program.  Test performance has     been validated by [employer] for patients greater     than or equal to 29 year old.     It is not intended     to diagnose infection nor to     guide or monitor treatment.  CULTURE, BLOOD (ROUTINE X 2)     Status: None   Collection Time    11/30/12  9:00 AM      Result Value Range Status   Specimen Description BLOOD HAND LEFT   Final   Special Requests BOTTLES DRAWN AEROBIC ONLY 8CC   Final   Culture  Setup Time 11/30/2012 14:48   Final   Culture     Final   Value:        BLOOD CULTURE RECEIVED NO GROWTH TO DATE CULTURE WILL BE HELD FOR 5 DAYS BEFORE ISSUING A FINAL NEGATIVE REPORT   Report Status PENDING   Incomplete  CULTURE, BLOOD (ROUTINE X 2)     Status: None   Collection Time    11/30/12  9:05 AM      Result Value Range Status   Specimen Description BLOOD LEFT FOREARM   Final   Special Requests BOTTLES DRAWN AEROBIC ONLY 6CC   Final   Culture  Setup Time 11/30/2012 14:48   Final   Culture     Final   Value:        BLOOD CULTURE RECEIVED NO GROWTH TO DATE CULTURE WILL BE HELD FOR 5 DAYS BEFORE ISSUING A FINAL NEGATIVE REPORT   Report Status PENDING   Incomplete  BODY FLUID CULTURE     Status: None   Collection Time    11/30/12 11:15 AM      Result Value Range Status   Specimen Description SYNOVIAL FLUID KNEE LEFT   Final   Special Requests FLUID   Final   Gram Stain     Final   Value: ABUNDANT WBC PRESENT, PREDOMINANTLY PMN     NO ORGANISMS SEEN     Performed at [facility]   Culture NO GROWTH 1 DAY   Final   Report Status PENDING   Incomplete  GRAM STAIN     Status: None   Collection Time    11/30/12 11:15 AM      Result Value Range Status   Specimen Description SYNOVIAL FLUID KNEE LEFT   Final   Special Requests FLUID   Final    Gram Stain     Final   Value: ABUNDANT WBC PRESENT, PREDOMINANTLY PMN     NO ORGANISMS SEEN   Report Status 11/30/2012 FINAL   Final     Labs: Basic Metabolic Panel:  Recent Labs Lab 11/28/12 0611 11/30/12 0915 11/30/12 1837 12/01/12 0525  NA 139 135  --  132*  K 4.2 4.0  --  4.7  CL  --  96  --  95*  CO2  --  24  --  23  GLUCOSE 167* 202*  --  315*  BUN  --  49*  --  65*  CREATININE  --  3.35* 3.56* 3.66*  CALCIUM  --  9.2  --  9.1  MG  --  2.0  --   --   PHOS  --  2.3  --   --    Liver Function Tests:  Recent Labs Lab 11/30/12 0915 12/01/12 0525  AST 16 16  ALT <5 <5  ALKPHOS 79 80  BILITOT 0.3 0.2*  PROT 6.8 7.0  ALBUMIN 2.5* 2.4*   No results found for this basename: LIPASE, AMYLASE,  in the last 168 hours No results found for this basename: AMMONIA,  in the last 168 hours CBC:  Recent Labs Lab 11/28/12 0611 11/30/12 0915 11/30/12 1837 12/01/12 0525  WBC  --  14.4* 12.6* 14.5*  NEUTROABS  --  11.4*  --   --   HGB 11.9* 10.9* 10.4* 10.3*  HCT 35.0* 32.8* 32.4* 31.6*  MCV  --  94.5 95.6 95.5  PLT  --  289 296 281   Cardiac Enzymes: No results found for this basename: CKTOTAL, CKMB, CKMBINDEX, TROPONINI,  in the last 168 hours BNP: BNP (last 3 results)  Recent Labs  10/30/12 0030  PROBNP 17301.0*   CBG:  Recent Labs Lab 12/01/12 1222 12/01/12 1703 12/01/12 2151 12/02/12 0808 12/02/12 1229  GLUCAP 206* 404* 448* 180* 219*       Signed:  GHERGHE, COSTIN  [facility] 12/02/2012, 1:38 PM

## 2012-12-03 LAB — BODY FLUID CULTURE: Culture: NO GROWTH

## 2012-12-05 ENCOUNTER — Encounter: Payer: Self-pay | Admitting: *Deleted

## 2012-12-06 LAB — CULTURE, BLOOD (ROUTINE X 2)
Culture: NO GROWTH
Culture: NO GROWTH

## 2012-12-07 ENCOUNTER — Ambulatory Visit (INDEPENDENT_AMBULATORY_CARE_PROVIDER_SITE_OTHER): Payer: Medicare Other | Admitting: Internal Medicine

## 2012-12-07 VITALS — BP 148/78 | HR 108 | Temp 98.4°F | Resp 18 | Ht 70.0 in | Wt 223.0 lb

## 2012-12-07 DIAGNOSIS — E1129 Type 2 diabetes mellitus with other diabetic kidney complication: Secondary | ICD-10-CM

## 2012-12-07 DIAGNOSIS — N186 End stage renal disease: Secondary | ICD-10-CM

## 2012-12-07 DIAGNOSIS — N039 Chronic nephritic syndrome with unspecified morphologic changes: Secondary | ICD-10-CM

## 2012-12-07 DIAGNOSIS — N189 Chronic kidney disease, unspecified: Secondary | ICD-10-CM

## 2012-12-07 DIAGNOSIS — M109 Gout, unspecified: Secondary | ICD-10-CM

## 2012-12-07 DIAGNOSIS — D631 Anemia in chronic kidney disease: Secondary | ICD-10-CM

## 2012-12-07 DIAGNOSIS — E1165 Type 2 diabetes mellitus with hyperglycemia: Secondary | ICD-10-CM

## 2012-12-07 MED ORDER — NEPHRO-VITE 0.8 MG PO TABS: 0.8000 mg | ORAL_TABLET | Freq: Every day | ORAL | Status: DC
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
## 2012-12-19 ENCOUNTER — Encounter (INDEPENDENT_AMBULATORY_CARE_PROVIDER_SITE_OTHER): Payer: Self-pay | Admitting: General Surgery

## 2012-12-19 ENCOUNTER — Ambulatory Visit (INDEPENDENT_AMBULATORY_CARE_PROVIDER_SITE_OTHER): Payer: Medicare Other | Admitting: General Surgery

## 2012-12-19 VITALS — BP 124/62 | HR 62 | Temp 98.4°F | Resp 15 | Ht 70.0 in | Wt 224.4 lb

## 2012-12-19 DIAGNOSIS — Z992 Dependence on renal dialysis: Secondary | ICD-10-CM

## 2012-12-19 NOTE — Progress Notes (Signed)
Patient ID: Jack Chung, male   DOB: 1940/12/03, 72 y.o.   MRN: [id number] The patient is a 72 year old male status post perineal dialysis catheter placement. This has been doing well aside from a gouty flare up that required admission. He continue with hemodialysis at this time but however is undergoing flushing is okay.  On exam: Wounds are clean dry and intact catheter is in place with a stitch in place.  The patient is a 72 year old male status post PD catheter placement. The patient will return in 2 weeks for nurse visit to remove the stitch. Patient will continue followup with his stream and the catheter as scheduled. He can return as needed.

## 2013-01-09 ENCOUNTER — Encounter (INDEPENDENT_AMBULATORY_CARE_PROVIDER_SITE_OTHER): Payer: Medicare Other

## 2013-02-26 ENCOUNTER — Other Ambulatory Visit: Payer: Self-pay | Admitting: *Deleted

## 2013-02-26 NOTE — Telephone Encounter (Signed)
Patient wife called and stated that patient needed a sample of Lantus. Patient does have a follow up appointment on 03/09/2013.

## 2013-02-26 NOTE — Telephone Encounter (Signed)
Called and left message that sample was left for him.

## 2013-03-09 ENCOUNTER — Encounter: Payer: Medicare Other | Admitting: Internal Medicine

## 2013-03-09 DIAGNOSIS — Z0289 Encounter for other administrative examinations: Secondary | ICD-10-CM

## 2013-03-12 NOTE — Progress Notes (Signed)
This encounter was created in error - please disregard.

## 2013-04-11 ENCOUNTER — Other Ambulatory Visit: Payer: Self-pay | Admitting: *Deleted

## 2013-04-11 MED ORDER — GLUCOSE BLOOD VI STRP: ORAL_STRIP | Status: DC

## 2013-04-13 ENCOUNTER — Ambulatory Visit (INDEPENDENT_AMBULATORY_CARE_PROVIDER_SITE_OTHER): Payer: Medicare Other | Admitting: Podiatrist

## 2013-04-13 ENCOUNTER — Encounter: Payer: Self-pay | Admitting: Podiatrist

## 2013-04-13 VITALS — BP 110/52 | HR 64 | Resp 20 | Ht 70.0 in | Wt 220.0 lb

## 2013-04-13 DIAGNOSIS — L03039 Cellulitis of unspecified toe: Secondary | ICD-10-CM

## 2013-04-13 NOTE — Progress Notes (Signed)
  Subjective:    Patient ID: Jack Chung, male    DOB: June 16, 1940, 72 y.o.   MRN: [id number] "He lost a toenail and now it's red and has pus."  Patient presents today for initial visit regarding his left 2nd toenail.  He states his toenail became infected and fell off.  He relates it had gotten pus underneath the toenail and it came off on its own.  Since it fell off it has gone on to cause no problems.  Patient is now on Keflex prescribed by his renal physician.  A culture was also taken.   HPI Comments: N  Red, pus, lost nail, Diabetic, Neuropathy L  Infected toenail 2nd left D  3 days ago O  suddenly C  About the same  A  None  T  Neosporin, bandaid, Keflex prescribed by renal doctor and he did a culture.      Review of Systems  DATA OBTAINED: from patient GENERAL: Feels well no fevers, no fatigue, no changes in appetite SKIN: No itching, no rashes, no open lesions, no wounds EYES: No eye pain,no redness, no discharge EARS: No earache,no ringing of ears, no recent change in hearing NOSE: + sinus congestion  MOUTH/THROAT: No mouth pain, No sore throat, No difficulty chewing or swallowing  RESPIRATORY: No cough, no wheezing, no SOB CARDIAC: No chest pain,no heart palpitations,no new onset lower extremity edema  GI: No abdominal pain, No Nausea, no vomiting, no diarrhea, no heartburn or no reflux  GU: No dysuria, no increased frequency or urgency MUSCULOSKELETAL: No unrelieved bone/joint pain, + difficulty walking NEUROLOGIC: Awake, alert, appropriate to situation, No change in mental status. PSYCHIATRIC: No overt anxiety or sadness.No behavior issue.  AMBULATION:  Ambulates unassisted     Objective:   Physical Exam  GENERAL APPEARANCE: Alert, conversant.  No acute distress.  VASCULAR: Pedal pulses palpable at 1/4 dp/pt bilateral. Proximal to distal cooling it warm to warm.   NEUROLOGIC: sensation is intact epicritically and protectively to 5.07 monofilament at 5/5 sites  bilateral.  Light touch is intact bilateral, vibratory sensation intact bilateral, achilles tendon reflex is intact bilateral.  MUSCULOSKELETAL: acceptable muscle strength, tone and stability bilateral.  Intrinsic muscluature intact bilateral.  DERMATOLOGIC: skin color, texture, and turger are decreased bilateral.  No pre-ulcerative lesions are seen.  2nd digit nail has fallen off and is healing well.  No sign of pus or pirulence.  No drainage noted.  Redness within the toe itself is present but does not extend beyond the toe itself.  No other open lesions are noted.        Assessment & Plan:  Assessment:  Healing paronychia 2nd digit left foot.  Mild cellulitis 2nd toe left Plan:  Recommended continued use of topical and oral antibiotic therapies.  If any recurrence of paronychia, redness, swelling, or pus is noted, he is to call.  Marlowe Aschoff, DPM

## 2013-04-13 NOTE — Patient Instructions (Signed)
Diabetes and Foot Care Diabetes may cause you to have problems because of poor blood supply (circulation) to your feet and legs. This may cause the skin on your feet to become thinner, break easier, and heal more slowly. Your skin may become dry, and the skin may peel and crack. You may also have nerve damage in your legs and feet causing decreased feeling in them. You may not notice minor injuries to your feet that could lead to infections or more serious problems. Taking care of your feet is one of the most important things you can do for yourself.  HOME CARE INSTRUCTIONS  Wear shoes at all times, even in the house. Do not go barefoot. Bare feet are easily injured.  Check your feet daily for blisters, cuts, and redness. If you cannot see the bottom of your feet, use a mirror or ask someone for help.  Wash your feet with warm water (do not use hot water) and mild soap. Then pat your feet and the areas between your toes until they are completely dry. Do not soak your feet as this can dry your skin.  Apply a moisturizing lotion or petroleum jelly (that does not contain alcohol and is unscented) to the skin on your feet and to dry, brittle toenails. Do not apply lotion between your toes.  Trim your toenails straight across. Do not dig under them or around the cuticle. File the edges of your nails with an emery board or nail file.  Do not cut corns or calluses or try to remove them with medicine.  Wear clean socks or stockings every day. Make sure they are not too tight. Do not wear knee-high stockings since they may decrease blood flow to your legs.  Wear shoes that fit properly and have enough cushioning. To break in new shoes, wear them for just a few hours a day. This prevents you from injuring your feet. Always look in your shoes before you put them on to be sure there are no objects inside.  Do not cross your legs. This may decrease the blood flow to your feet.  If you find a minor scrape,  cut, or break in the skin on your feet, keep it and the skin around it clean and dry. These areas may be cleansed with mild soap and water. Do not cleanse the area with peroxide, alcohol, or iodine.  When you remove an adhesive bandage, be sure not to damage the skin around it.  If you have a wound, look at it several times a day to make sure it is healing.  Do not use heating pads or hot water bottles. They may burn your skin. If you have lost feeling in your feet or legs, you may not know it is happening until it is too late.  Make sure your health care provider performs a complete foot exam at least annually or more often if you have foot problems. Report any cuts, sores, or bruises to your health care provider immediately. SEEK MEDICAL CARE IF:   You have an injury that is not healing.  You have cuts or breaks in the skin.  You have an ingrown nail.  You notice redness on your legs or feet.  You feel burning or tingling in your legs or feet.  You have pain or cramps in your legs and feet.  Your legs or feet are numb.  Your feet always feel cold. SEEK IMMEDIATE MEDICAL CARE IF:   There is increasing redness,   swelling, or pain in or around a wound.  There is a red line that goes up your leg.  Pus is coming from a wound.  You develop a fever or as directed by your health care provider.  You notice a bad smell coming from an ulcer or wound. Document Released: 05/21/2000 Document Revised: 01/24/2013 Document Reviewed: 10/31/2012 [employer] Patient Information 2014 [employer].  

## 2013-06-11 ENCOUNTER — Encounter: Payer: Self-pay | Admitting: Internal Medicine

## 2013-07-16 ENCOUNTER — Encounter (HOSPITAL_COMMUNITY): Payer: Self-pay | Admitting: Emergency Medicine

## 2013-07-16 ENCOUNTER — Emergency Department (HOSPITAL_COMMUNITY): Payer: Medicare Other

## 2013-07-16 DIAGNOSIS — E8779 Other fluid overload: Secondary | ICD-10-CM | POA: Diagnosis present

## 2013-07-16 DIAGNOSIS — E039 Hypothyroidism, unspecified: Secondary | ICD-10-CM | POA: Diagnosis present

## 2013-07-16 DIAGNOSIS — I70209 Unspecified atherosclerosis of native arteries of extremities, unspecified extremity: Secondary | ICD-10-CM | POA: Diagnosis present

## 2013-07-16 DIAGNOSIS — E11319 Type 2 diabetes mellitus with unspecified diabetic retinopathy without macular edema: Secondary | ICD-10-CM | POA: Diagnosis present

## 2013-07-16 DIAGNOSIS — Z794 Long term (current) use of insulin: Secondary | ICD-10-CM

## 2013-07-16 DIAGNOSIS — G4700 Insomnia, unspecified: Secondary | ICD-10-CM | POA: Diagnosis present

## 2013-07-16 DIAGNOSIS — N039 Chronic nephritic syndrome with unspecified morphologic changes: Secondary | ICD-10-CM

## 2013-07-16 DIAGNOSIS — N186 End stage renal disease: Secondary | ICD-10-CM | POA: Diagnosis present

## 2013-07-16 DIAGNOSIS — Z992 Dependence on renal dialysis: Secondary | ICD-10-CM

## 2013-07-16 DIAGNOSIS — E1129 Type 2 diabetes mellitus with other diabetic kidney complication: Secondary | ICD-10-CM | POA: Diagnosis present

## 2013-07-16 DIAGNOSIS — Z888 Allergy status to other drugs, medicaments and biological substances status: Secondary | ICD-10-CM

## 2013-07-16 DIAGNOSIS — D631 Anemia in chronic kidney disease: Secondary | ICD-10-CM | POA: Diagnosis present

## 2013-07-16 DIAGNOSIS — Z7982 Long term (current) use of aspirin: Secondary | ICD-10-CM

## 2013-07-16 DIAGNOSIS — R509 Fever, unspecified: Principal | ICD-10-CM | POA: Diagnosis present

## 2013-07-16 DIAGNOSIS — I2510 Atherosclerotic heart disease of native coronary artery without angina pectoris: Secondary | ICD-10-CM | POA: Diagnosis present

## 2013-07-16 DIAGNOSIS — Z79899 Other long term (current) drug therapy: Secondary | ICD-10-CM

## 2013-07-16 DIAGNOSIS — E1165 Type 2 diabetes mellitus with hyperglycemia: Secondary | ICD-10-CM | POA: Diagnosis present

## 2013-07-16 DIAGNOSIS — I120 Hypertensive chronic kidney disease with stage 5 chronic kidney disease or end stage renal disease: Secondary | ICD-10-CM | POA: Diagnosis present

## 2013-07-16 DIAGNOSIS — Z836 Family history of other diseases of the respiratory system: Secondary | ICD-10-CM

## 2013-07-16 DIAGNOSIS — Z887 Allergy status to serum and vaccine status: Secondary | ICD-10-CM

## 2013-07-16 DIAGNOSIS — Z951 Presence of aortocoronary bypass graft: Secondary | ICD-10-CM

## 2013-07-16 DIAGNOSIS — E785 Hyperlipidemia, unspecified: Secondary | ICD-10-CM | POA: Diagnosis present

## 2013-07-16 DIAGNOSIS — E1139 Type 2 diabetes mellitus with other diabetic ophthalmic complication: Secondary | ICD-10-CM | POA: Diagnosis present

## 2013-07-16 DIAGNOSIS — G4730 Sleep apnea, unspecified: Secondary | ICD-10-CM

## 2013-07-16 DIAGNOSIS — I509 Heart failure, unspecified: Secondary | ICD-10-CM | POA: Diagnosis present

## 2013-07-16 DIAGNOSIS — H5440 Blindness, one eye, unspecified eye: Secondary | ICD-10-CM | POA: Diagnosis present

## 2013-07-16 DIAGNOSIS — R109 Unspecified abdominal pain: Secondary | ICD-10-CM | POA: Diagnosis present

## 2013-07-16 DIAGNOSIS — D869 Sarcoidosis, unspecified: Secondary | ICD-10-CM | POA: Diagnosis present

## 2013-07-16 DIAGNOSIS — L259 Unspecified contact dermatitis, unspecified cause: Secondary | ICD-10-CM | POA: Diagnosis present

## 2013-07-16 DIAGNOSIS — M109 Gout, unspecified: Secondary | ICD-10-CM | POA: Diagnosis present

## 2013-07-16 NOTE — ED Notes (Signed)
Pt. reports productive cough with fever and left low back pain onset today .

## 2013-07-17 ENCOUNTER — Inpatient Hospital Stay (HOSPITAL_COMMUNITY): Payer: Medicare Other

## 2013-07-17 ENCOUNTER — Inpatient Hospital Stay (HOSPITAL_COMMUNITY)
Admission: EM | Admit: 2013-07-17 | Discharge: 2013-07-18 | DRG: 864 | Disposition: A | Discharge status: Home / Self Care | Payer: Medicare Other | Attending: Internal Medicine | Admitting: Internal Medicine

## 2013-07-17 ENCOUNTER — Encounter (HOSPITAL_COMMUNITY): Payer: Self-pay | Admitting: General Practice

## 2013-07-17 DIAGNOSIS — E1165 Type 2 diabetes mellitus with hyperglycemia: Secondary | ICD-10-CM | POA: Diagnosis present

## 2013-07-17 DIAGNOSIS — I509 Heart failure, unspecified: Secondary | ICD-10-CM

## 2013-07-17 DIAGNOSIS — D649 Anemia, unspecified: Secondary | ICD-10-CM

## 2013-07-17 DIAGNOSIS — D869 Sarcoidosis, unspecified: Secondary | ICD-10-CM

## 2013-07-17 DIAGNOSIS — R509 Fever, unspecified: Secondary | ICD-10-CM | POA: Diagnosis present

## 2013-07-17 DIAGNOSIS — E1129 Type 2 diabetes mellitus with other diabetic kidney complication: Secondary | ICD-10-CM | POA: Diagnosis present

## 2013-07-17 DIAGNOSIS — Z992 Dependence on renal dialysis: Secondary | ICD-10-CM

## 2013-07-17 DIAGNOSIS — M109 Gout, unspecified: Secondary | ICD-10-CM | POA: Diagnosis present

## 2013-07-17 DIAGNOSIS — N186 End stage renal disease: Secondary | ICD-10-CM | POA: Diagnosis present

## 2013-07-17 DIAGNOSIS — I2510 Atherosclerotic heart disease of native coronary artery without angina pectoris: Secondary | ICD-10-CM | POA: Diagnosis present

## 2013-07-17 DIAGNOSIS — R109 Unspecified abdominal pain: Secondary | ICD-10-CM

## 2013-07-17 DIAGNOSIS — G4730 Sleep apnea, unspecified: Secondary | ICD-10-CM

## 2013-07-17 HISTORY — DX: End stage renal disease: N18.6

## 2013-07-17 HISTORY — DX: Dependence on renal dialysis: Z99.2

## 2013-07-17 LAB — COMPREHENSIVE METABOLIC PANEL
ALK PHOS: 126 U/L — AB (ref 39–117)
ALT: 16 U/L (ref 0–53)
ALT: 21 U/L (ref 0–53)
AST: 20 U/L (ref 0–37)
AST: 25 U/L (ref 0–37)
Albumin: 1.9 g/dL — ABNORMAL LOW (ref 3.5–5.2)
Albumin: 2.3 g/dL — ABNORMAL LOW (ref 3.5–5.2)
Alkaline Phosphatase: 99 U/L (ref 39–117)
BILIRUBIN TOTAL: 0.3 mg/dL (ref 0.3–1.2)
BILIRUBIN TOTAL: 0.4 mg/dL (ref 0.3–1.2)
BUN: 28 mg/dL — AB (ref 6–23)
BUN: 28 mg/dL — ABNORMAL HIGH (ref 6–23)
CHLORIDE: 92 meq/L — AB (ref 96–112)
CHLORIDE: 95 meq/L — AB (ref 96–112)
CO2: 26 mEq/L (ref 19–32)
CO2: 28 meq/L (ref 19–32)
Calcium: 7.6 mg/dL — ABNORMAL LOW (ref 8.4–10.5)
Calcium: 7.9 mg/dL — ABNORMAL LOW (ref 8.4–10.5)
Creatinine, Ser: 2.49 mg/dL — ABNORMAL HIGH (ref 0.50–1.35)
Creatinine, Ser: 2.58 mg/dL — ABNORMAL HIGH (ref 0.50–1.35)
GFR calc non Af Amer: 25 mL/min — ABNORMAL LOW (ref >90)
GFR, EST AFRICAN AMERICAN: 27 mL/min — AB (ref >90)
GFR, EST AFRICAN AMERICAN: 29 mL/min — AB (ref >90)
GFR, EST NON AFRICAN AMERICAN: 24 mL/min — AB (ref >90)
GLUCOSE: 298 mg/dL — AB (ref 70–99)
GLUCOSE: 360 mg/dL — AB (ref 70–99)
POTASSIUM: 3.7 meq/L (ref 3.7–5.3)
Potassium: 3.2 mEq/L — ABNORMAL LOW (ref 3.7–5.3)
Sodium: 135 mEq/L — ABNORMAL LOW (ref 137–147)
Sodium: 135 mEq/L — ABNORMAL LOW (ref 137–147)
TOTAL PROTEIN: 6.6 g/dL (ref 6.0–8.3)
Total Protein: 5.7 g/dL — ABNORMAL LOW (ref 6.0–8.3)

## 2013-07-17 LAB — CBC WITH DIFFERENTIAL/PLATELET
Basophils Absolute: 0 10*3/uL (ref 0.0–0.1)
Basophils Absolute: 0 10*3/uL (ref 0.0–0.1)
Basophils Relative: 0 % (ref 0–1)
Basophils Relative: 0 % (ref 0–1)
EOS ABS: 0.2 10*3/uL (ref 0.0–0.7)
EOS ABS: 0.3 10*3/uL (ref 0.0–0.7)
Eosinophils Relative: 2 % (ref 0–5)
Eosinophils Relative: 2 % (ref 0–5)
HCT: 35 % — ABNORMAL LOW (ref 39.0–52.0)
HEMATOCRIT: 37.8 % — AB (ref 39.0–52.0)
HEMOGLOBIN: 12.9 g/dL — AB (ref 13.0–17.0)
Hemoglobin: 11.8 g/dL — ABNORMAL LOW (ref 13.0–17.0)
LYMPHS ABS: 1.2 10*3/uL (ref 0.7–4.0)
Lymphocytes Relative: 14 % (ref 12–46)
Lymphocytes Relative: 9 % — ABNORMAL LOW (ref 12–46)
Lymphs Abs: 1.5 10*3/uL (ref 0.7–4.0)
MCH: 32.2 pg (ref 26.0–34.0)
MCH: 32.3 pg (ref 26.0–34.0)
MCHC: 33.7 g/dL (ref 30.0–36.0)
MCHC: 34.1 g/dL (ref 30.0–36.0)
MCV: 94.5 fL (ref 78.0–100.0)
MCV: 95.6 fL (ref 78.0–100.0)
MONO ABS: 1.4 10*3/uL — AB (ref 0.1–1.0)
MONOS PCT: 10 % (ref 3–12)
MONOS PCT: 11 % (ref 3–12)
Monocytes Absolute: 1.2 10*3/uL — ABNORMAL HIGH (ref 0.1–1.0)
NEUTROS PCT: 73 % (ref 43–77)
NEUTROS PCT: 79 % — AB (ref 43–77)
Neutro Abs: 11 10*3/uL — ABNORMAL HIGH (ref 1.7–7.7)
Neutro Abs: 7.6 10*3/uL (ref 1.7–7.7)
Platelets: 244 10*3/uL (ref 150–400)
Platelets: 300 10*3/uL (ref 150–400)
RBC: 3.66 MIL/uL — ABNORMAL LOW (ref 4.22–5.81)
RBC: 4 MIL/uL — AB (ref 4.22–5.81)
RDW: 12.9 % (ref 11.5–15.5)
RDW: 12.9 % (ref 11.5–15.5)
WBC: 10.4 10*3/uL (ref 4.0–10.5)
WBC: 14 10*3/uL — ABNORMAL HIGH (ref 4.0–10.5)

## 2013-07-17 LAB — URINE MICROSCOPIC-ADD ON

## 2013-07-17 LAB — URINALYSIS, ROUTINE W REFLEX MICROSCOPIC
Bilirubin Urine: NEGATIVE
Glucose, UA: >1000 mg/dL — AB
KETONES UR: NEGATIVE mg/dL
NITRITE: NEGATIVE
PROTEIN: 30 mg/dL — AB
Specific Gravity, Urine: 1.017 (ref 1.005–1.030)
Urobilinogen, UA: 0.2 mg/dL (ref 0.0–1.0)
pH: 5.5 (ref 5.0–8.0)

## 2013-07-17 LAB — BODY FLUID CELL COUNT WITH DIFFERENTIAL
Eos, Fluid: 0 %
WBC FLUID: 2 uL (ref 0–1000)

## 2013-07-17 LAB — GRAM STAIN

## 2013-07-17 LAB — GLUCOSE, CAPILLARY
GLUCOSE-CAPILLARY: 169 mg/dL — AB (ref 70–99)
GLUCOSE-CAPILLARY: 126 mg/dL — AB (ref 70–99)
Glucose-Capillary: 248 mg/dL — ABNORMAL HIGH (ref 70–99)
Glucose-Capillary: 159 mg/dL — ABNORMAL HIGH (ref 70–99)

## 2013-07-17 LAB — INFLUENZA PANEL BY PCR (TYPE A & B)
H1N1 flu by pcr: NOT DETECTED
INFLAPCR: NEGATIVE
Influenza B By PCR: NEGATIVE

## 2013-07-17 MED ORDER — INSULIN ASPART 100 UNIT/ML ~~LOC~~ SOLN
0.0000 [IU] | Freq: Three times a day (TID) | SUBCUTANEOUS | Status: DC
  Administered 2013-07-17: 1 [IU] via SUBCUTANEOUS
  Administered 2013-07-17: 2 [IU] via SUBCUTANEOUS
  Administered 2013-07-17: 5 [IU] via SUBCUTANEOUS
  Administered 2013-07-18: 2 [IU] via SUBCUTANEOUS
  Administered 2013-07-18: 3 [IU] via SUBCUTANEOUS
  Administered 2013-07-18: 2 [IU] via SUBCUTANEOUS

## 2013-07-17 MED ORDER — POTASSIUM CHLORIDE CRYS ER 20 MEQ PO TBCR
20.0000 meq | EXTENDED_RELEASE_TABLET | Freq: Every day | ORAL | Status: DC
  Administered 2013-07-17 – 2013-07-18 (×2): 20 meq via ORAL
  Filled 2013-07-17 (×2): qty 1

## 2013-07-17 MED ORDER — METOPROLOL TARTRATE 25 MG PO TABS
25.0000 mg | ORAL_TABLET | Freq: Two times a day (BID) | ORAL | Status: DC
  Administered 2013-07-17 – 2013-07-18 (×3): 25 mg via ORAL
  Filled 2013-07-17 (×5): qty 1

## 2013-07-17 MED ORDER — VANCOMYCIN HCL IN DEXTROSE 1-5 GM/200ML-% IV SOLN
1000.0000 mg | Freq: Once | INTRAVENOUS | Status: AC
  Administered 2013-07-17: 1000 mg via INTRAVENOUS
  Filled 2013-07-17: qty 200

## 2013-07-17 MED ORDER — INSULIN GLARGINE 100 UNIT/ML ~~LOC~~ SOLN
30.0000 [IU] | Freq: Every morning | SUBCUTANEOUS | Status: DC
  Administered 2013-07-17 – 2013-07-18 (×2): 30 [IU] via SUBCUTANEOUS
  Filled 2013-07-17 (×2): qty 0.3

## 2013-07-17 MED ORDER — DEXTROSE 5 % IV SOLN
1.0000 g | INTRAVENOUS | Status: DC
  Administered 2013-07-17 – 2013-07-18 (×2): 1 g via INTRAVENOUS
  Filled 2013-07-17 (×2): qty 1

## 2013-07-17 MED ORDER — CALCIUM CARBONATE-VITAMIN D 500-200 MG-UNIT PO TABS
1.0000 | ORAL_TABLET | Freq: Every day | ORAL | Status: DC
  Administered 2013-07-17 – 2013-07-18 (×2): 1 via ORAL
  Filled 2013-07-17 (×3): qty 1

## 2013-07-17 MED ORDER — MORPHINE SULFATE 2 MG/ML IJ SOLN: 1.0000 mg | INTRAMUSCULAR | Status: DC | PRN

## 2013-07-17 MED ORDER — ASPIRIN 325 MG PO TABS
325.0000 mg | ORAL_TABLET | Freq: Every day | ORAL | Status: DC
  Administered 2013-07-17 – 2013-07-18 (×2): 325 mg via ORAL
  Filled 2013-07-17 (×2): qty 1

## 2013-07-17 MED ORDER — ENOXAPARIN SODIUM 30 MG/0.3ML ~~LOC~~ SOLN
30.0000 mg | SUBCUTANEOUS | Status: DC
  Administered 2013-07-17 – 2013-07-18 (×2): 30 mg via SUBCUTANEOUS
  Filled 2013-07-17 (×2): qty 0.3

## 2013-07-17 MED ORDER — LORATADINE 10 MG PO TABS
10.0000 mg | ORAL_TABLET | Freq: Every day | ORAL | Status: DC
  Administered 2013-07-17 – 2013-07-18 (×2): 10 mg via ORAL
  Filled 2013-07-17 (×2): qty 1

## 2013-07-17 MED ORDER — PIPERACILLIN-TAZOBACTAM 3.375 G IVPB
3.3750 g | Freq: Once | INTRAVENOUS | Status: AC
  Administered 2013-07-17: 3.375 g via INTRAVENOUS
  Filled 2013-07-17: qty 50

## 2013-07-17 MED ORDER — LEVOTHYROXINE SODIUM 112 MCG PO TABS
112.0000 ug | ORAL_TABLET | Freq: Every day | ORAL | Status: DC
  Administered 2013-07-17 – 2013-07-18 (×2): 112 ug via ORAL
  Filled 2013-07-17 (×3): qty 1

## 2013-07-17 MED ORDER — SODIUM CHLORIDE 0.9 % IJ SOLN
3.0000 mL | Freq: Two times a day (BID) | INTRAMUSCULAR | Status: DC
  Administered 2013-07-17 – 2013-07-18 (×2): 3 mL via INTRAVENOUS

## 2013-07-17 MED ORDER — ACETAMINOPHEN 325 MG PO TABS
650.0000 mg | ORAL_TABLET | Freq: Once | ORAL | Status: AC
  Administered 2013-07-17: 650 mg via ORAL
  Filled 2013-07-17: qty 2

## 2013-07-17 MED ORDER — RENA-VITE PO TABS
1.0000 | ORAL_TABLET | Freq: Every day | ORAL | Status: DC
  Administered 2013-07-17: 1 via ORAL
  Filled 2013-07-17 (×2): qty 1

## 2013-07-17 MED ORDER — FUROSEMIDE 40 MG PO TABS
40.0000 mg | ORAL_TABLET | Freq: Two times a day (BID) | ORAL | Status: DC
  Administered 2013-07-17 – 2013-07-18 (×4): 40 mg via ORAL
  Filled 2013-07-17 (×5): qty 1

## 2013-07-17 MED ORDER — PREDNISONE 1 MG PO TABS
1.0000 mg | ORAL_TABLET | Freq: Two times a day (BID) | ORAL | Status: DC
  Administered 2013-07-17 – 2013-07-18 (×3): 1 mg via ORAL
  Filled 2013-07-17 (×4): qty 1

## 2013-07-17 MED ORDER — SODIUM CHLORIDE 0.9 % IJ SOLN: 3.0000 mL | Freq: Two times a day (BID) | INTRAMUSCULAR | Status: DC

## 2013-07-17 MED ORDER — ACETAMINOPHEN 650 MG RE SUPP: 650.0000 mg | Freq: Four times a day (QID) | RECTAL | Status: DC | PRN

## 2013-07-17 MED ORDER — HEPARIN SODIUM (PORCINE) 1000 UNIT/ML IJ SOLN
500.0000 [IU] | INTRAMUSCULAR | Status: DC
  Filled 2013-07-17: qty 0.5

## 2013-07-17 MED ORDER — VANCOMYCIN HCL IN DEXTROSE 1-5 GM/200ML-% IV SOLN
1000.0000 mg | Freq: Once | INTRAVENOUS | Status: AC
Start: 2013-07-17 — End: 2013-07-17
  Administered 2013-07-17: 1000 mg via INTRAVENOUS
  Filled 2013-07-17: qty 200

## 2013-07-17 MED ORDER — ONDANSETRON HCL 4 MG/2ML IJ SOLN: 4.0000 mg | Freq: Four times a day (QID) | INTRAMUSCULAR | Status: DC | PRN

## 2013-07-17 MED ORDER — HEPARIN SODIUM (PORCINE) 1000 UNIT/ML IJ SOLN
1500.0000 [IU] | INTRAMUSCULAR | Status: DC
  Administered 2013-07-17: 1500 [IU] via INTRAPERITONEAL
  Filled 2013-07-17 (×2): qty 1.5

## 2013-07-17 MED ORDER — DELFLEX-LC/1.5% DEXTROSE 346 MOSM/L IP SOLN
INTRAPERITONEAL | Status: DC
  Administered 2013-07-17: 5000 mL via INTRAPERITONEAL

## 2013-07-17 MED ORDER — ONDANSETRON HCL 4 MG PO TABS: 4.0000 mg | ORAL_TABLET | Freq: Four times a day (QID) | ORAL | Status: DC | PRN

## 2013-07-17 MED ORDER — ACETAMINOPHEN 325 MG PO TABS: 650.0000 mg | ORAL_TABLET | Freq: Four times a day (QID) | ORAL | Status: DC | PRN

## 2013-07-17 MED ORDER — DELFLEX-LC/2.5% DEXTROSE 394 MOSM/L IP SOLN
INTRAPERITONEAL | Status: DC
  Administered 2013-07-17 (×2): 5000 mL via INTRAPERITONEAL

## 2013-07-17 MED ORDER — CALCIUM CARBONATE-VITAMIN D 250-125 MG-UNIT PO TABS: 1.0000 | ORAL_TABLET | Freq: Every day | ORAL | Status: DC

## 2013-07-17 MED ORDER — ALLOPURINOL 100 MG PO TABS
100.0000 mg | ORAL_TABLET | Freq: Two times a day (BID) | ORAL | Status: DC
  Administered 2013-07-17 – 2013-07-18 (×2): 100 mg via ORAL
  Filled 2013-07-17 (×4): qty 1

## 2013-07-17 MED ORDER — GUAIFENESIN ER 600 MG PO TB12: 1200.0000 mg | ORAL_TABLET | ORAL | Status: DC | PRN

## 2013-07-17 NOTE — ED Notes (Signed)
Pt on peritoneal dialysis since June. No signs of infection noted by pt or family.

## 2013-07-17 NOTE — H&P (Addendum)
[facility] History and Physical  Jack Chung DGU:[id number] DOB: 11-27-1940 DOA: 07/17/2013  Referring physician: ER physician. PCP: Hollace Kinnier, DO   Chief Complaint: Fever.  HPI: Jack Chung is a 73 y.o. male with history of ESRD on peritoneal dialysis, CAD, gout, hypertension presented to the ER because of fever. Patient has been having subjective sensation of fever and chills since last after noon. Over the last 4 days patient has been having cough. Denies any chest pain or shortness of breath. Since last evening patient has been having left flank pain but denies any fall trauma. Patient states that his dialysis fluid is clear. Chest x-ray does not show any infiltrates. Patient had blood cultures drawn and at this time has been started on empiric antibiotics and admitted for further management. Patient otherwise denies any nausea vomiting diarrhea. Denies any chest pain.   Review of Systems: As presented in the history of presenting illness, rest negative.  Past Medical History  Diagnosis Date  . Hypertension   . Diabetes mellitus   . Hypothyroidism   . CHF (congestive heart failure)   . Anemia   . Blind left eye   . Sleep apnea     not on CPAP  . Coronary artery disease     CABG x 11 Jun 2009.  MRSA infections of incsions  . Arthritis   . Sarcoidosis     Hx:of  . Diabetic retinopathy     Hx: of bilateral  . Edema   . Hyperlipidemia   . 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 Atherosclerosis of native arteries of the extremities, unspecified   . Eczema     Hx: of  . Insomnia   . Seasonal allergies   . Chronic kidney disease     was on dialysis in July 2011 and then stopped and restarted may 2014  Started on Hemo 3 days  a week.   Marland Kitchen History of blood transfusion     with heart surgery  . Coronary atherosclerosis of native coronary artery   . Atherosclerosis of native arteries of the extremities, unspecified    Past Surgical History   Procedure Laterality Date  . Cardiac surgery      total of 6 surgeries, 5 related to mrsa  . Coronary artery bypass graft  06/2009  . Cataract surgery      Hx: of  . Debridements      Hx: of secondary to MRSA  . Av fistula placement Right 10/11/2012    Procedure: ARTERIOVENOUS (AV) FISTULA CREATION;  Surgeon: Rosetta Posner, MD;  Location: [place];  Service: Vascular;  Laterality: Right;  . Insertion of dialysis catheter Left   . Capd insertion N/A 11/28/2012    Procedure: LAPAROSCOPIC PERITONEAL DIALYSIS CATHETER PLACEMENT;  Surgeon: Ralene Ok, MD;  Location: [place];  Service: General;  Laterality: N/A;   Social History:  reports that he has never smoked. He has never used smokeless tobacco. He reports that he does not drink alcohol or use illicit drugs. Where does patient live  home.  Can patient participate in ADLs?  yes.   Allergies  Allergen Reactions  . Flu Virus Vaccine     Gets the flu, patient has bad reaction to the flu vaccine.  . Lipitor [Atorvastatin]     Leg cramps    Family History:  Family History  Problem Relation Age of Onset  . COPD Mother   . COPD Father  Prior to Admission medications   Medication Sig Start Date End Date Taking? Authorizing Provider  allopurinol (ZYLOPRIM) 100 MG tablet Take 1 tablet (100 mg total) by mouth 2 (two) times daily. 11/07/12   Delfina Redwood, MD  aspirin 325 MG tablet Take 325 mg by mouth daily.    Historical Provider, MD  b complex-vitamin c-folic acid (NEPHRO-VITE) 0.8 MG TABS Take 1 tablet by mouth at bedtime. 12/07/12   Tiffany L Reed, DO  Calcium Carbonate Antacid (TUMS E-X PO) Take by mouth.    Historical Provider, MD  Calcium Carbonate-Vitamin D (CALCIUM + D PO) Take 1 tablet by mouth daily.     Historical Provider, MD  cephALEXin (KEFLEX) 500 MG capsule Take 500 mg by mouth 2 (two) times daily.    Historical Provider, MD  cetirizine (ZYRTEC) 10 MG tablet Take 10 mg by mouth as needed.     Historical Provider, MD   furosemide (LASIX) 40 MG tablet Take 40 mg by mouth 2 (two) times daily.    Historical Provider, MD  glucose blood (ACCU-CHEK AVIVA) test strip Use one strip to check glucose once daily Dx: 250.00 04/11/13   Pricilla Larsson, NP  guaiFENesin (MUCINEX) 600 MG 12 hr tablet Take 1,200 mg by mouth as needed.     Historical Provider, MD  insulin aspart (NOVOLOG) 100 UNIT/ML injection Inject 2-8 Units into the skin 3 (three) times daily with meals. Take none if blood sugar < 150, 151-200 2 units, 201-250 4 units, 251-300 6 units, 301-350 8 units    Historical Provider, MD  insulin glargine (LANTUS) 100 UNIT/ML injection Inject 0.3 mLs (30 Units total) into the skin every morning. 10/27/12   Tiffany L Reed, DO  levothyroxine (SYNTHROID, LEVOTHROID) 112 MCG tablet Take 112 mcg by mouth daily.    Historical Provider, MD  metoprolol tartrate (LOPRESSOR) 25 MG tablet Take 1 tablet (25 mg total) by mouth 2 (two) times daily. 11/07/12   Delfina Redwood, MD  PERITONEAL DIALYSIS SOLUTIONS IP Inject 2,700 Units into the peritoneum 6 (six) times daily.    Historical Provider, MD  potassium chloride SA (K-DUR,KLOR-CON) 20 MEQ tablet Take 20 mEq by mouth once.    Historical Provider, MD  predniSONE (DELTASONE) 1 MG tablet Take 1 mg by mouth 2 (two) times daily.    Historical Provider, MD  predniSONE (DELTASONE) 10 MG tablet Take 1 tablet (10 mg total) by mouth daily. 2 tablets twice daily for 4 days then 1 tablet twice daily for 4 days then 1 tablet daily for 4 days then resume previous dose. 12/02/12   Caren Griffins, MD    Physical Exam: Filed Vitals:   07/16/13 2326 07/17/13 0226 07/17/13 0314 07/17/13 0552  BP: 136/83 144/68 126/63 130/72  Pulse: 82 85 72 69  Temp: 100.6 F (38.1 C) 101 F (38.3 C) 100.9 F (38.3 C) 98.5 F (36.9 C)  TempSrc: Oral Oral Oral Oral  Resp: 18 20 18 18   Height: 5\' 10"  (1.778 m)     Weight: 102.513 kg (226 lb)     SpO2: 99% 99% 94% 95%     General:   well-developed  well-nourished.   Eyes:  anicteric no pallor.   ENT:  no discharge from ears eyes nose mouth.   Neck:  no mass felt.  Cardiovascular:  S1-S2 heard.  Respiratory: No rhonchi or crepitations.   Abdomen:  soft nontender bowel sounds present. No guarding or rigidity.   Skin:  no rash.  Musculoskeletal:  No edema.   Psychiatric:  appears normal.  Neurologic:  alert awake oriented to time place and person. Moves all extremities.   Labs on Admission:  Basic Metabolic Panel:  Recent Labs Lab 07/16/13  NA 135*  K 3.7  CL 92*  CO2 26  GLUCOSE 360*  BUN 28*  CREATININE 2.49*  CALCIUM 7.9*   Liver Function Tests:  Recent Labs Lab 07/16/13  AST 25  ALT 21  ALKPHOS 126*  BILITOT 0.3  PROT 6.6  ALBUMIN 2.3*   No results found for this basename: LIPASE, AMYLASE,  in the last 168 hours No results found for this basename: AMMONIA,  in the last 168 hours CBC:  Recent Labs Lab 07/16/13  WBC 14.0*  NEUTROABS 11.0*  HGB 12.9*  HCT 37.8*  MCV 94.5  PLT 300   Cardiac Enzymes: No results found for this basename: CKTOTAL, CKMB, CKMBINDEX, TROPONINI,  in the last 168 hours  BNP (last 3 results)  Recent Labs  10/30/12 0030  PROBNP 17301.0*   CBG: No results found for this basename: GLUCAP,  in the last 168 hours  Radiological Exams on Admission: Dg Chest 2 View  07/16/2013   CLINICAL DATA:  Cough.  Fever.  EXAM: CHEST  2 VIEW  COMPARISON:  DG CHEST 2 VIEW dated 11/30/2012  FINDINGS: Unchanged cardiomegaly. The left IJ dialysis catheter is been removed. Aortic arch atherosclerosis. Median sternotomy wire is present. There is no airspace disease or effusion.  IMPRESSION: Mild cardiomegaly without failure.  No acute abnormality.   Electronically Signed   By: Dereck Ligas M.D.   On: 07/16/2013 23:55    EKG: Independently reviewed.  normal sinus rhythm with low-voltage.  Assessment/Plan Principal Problem:   Fever Active Problems:   Type II or unspecified type  diabetes mellitus with renal manifestations, uncontrolled   Left flank pain   ESRD on dialysis   Gout   CAD (coronary artery disease)   1. Fever  - source is not clear. Suspect it could be upper respiratory tract given the cough but since patient also has left flank pain at this time I have ordered CT abdomen and pelvis to check for any pyelonephritis. Follow blood cultures. Check influenza PCR. We will continue with vancomycin and cefepime for now. Patient's dialysis fluid from the peritoneal has been clear as per patient's wife.  2. ESRD on peritoneal dialysis - consult nephrology for dialysis.  3. CAD status post CABG  - denies any chest pain.  4. History of gout  - on prednisone. Has had recent exacerbation and was on increased dose of prednisone last week.  5. Diabetes mellitus type 2 - closely follow CBGs with sliding-scale coverage. 6. 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 7. Hypothyroidism - continue Synthroid. 8. Hypertension - continue home medications. Patient is still on Lasix and potassium. Closely follow metabolic panel.  I have reviewed patient's old charts and labs.  Peritoneal fluid should be sent for cell count and gram stain.   Code Status:  full code.  Family Communication: Patient's wife at the bedside.   Disposition Plan:  admit to inpatient.     Kianah Harries N. [facility] Pager [phone].   If 7PM-7AM, please contact night-coverage [email or web address] Password [email or web address] 07/17/2013, 6:09 AM

## 2013-07-17 NOTE — Consult Note (Signed)
Renal Service Consult Note [facility]  Jack Chung 07/17/2013 [place] Requesting Physician:  Dr Hal Hope  Reason for Consult:  ESRD pt on PD with fever HPI: The patient is a 73 y.o. year-old with hx of complicated DM, HTN, CAD with CABG. He was on HD on 2011 then off and restarted in May 2014.  He switched to PD in Aug 2014.  His wife does the exchanges using a cycler when at home and doing CAPD with 6 exchanges per day when "on the road".  They travel a lot.  When they're driving, he warms the PD fluid on the dashboard in the sun or with the defroster on when it's cold.  They stop the car, hook up the drainage bag and drain while driving. They do the same while filling.    He presented to ED with fever, temp 101.  He said he didn't want to come, but his wife made him. His fluid has been clear, no abd pain. Had some L flank pain. Abd and pelvis CT without contrast was negative for signs of infection.  CXR and UA were negative also. No dysuria, voids still.    ROS  no cp,  no ha,   no confusion  no n/v/d  no jt pain  no rash or itching  Past Medical History  Past Medical History  Diagnosis Date  . Hypertension   . Diabetes mellitus   . Hypothyroidism   . CHF (congestive heart failure)   . Anemia   . Blind left eye   . Sleep apnea     not on CPAP  . Coronary artery disease     CABG x 11 Jun 2009.  MRSA infections of incsions  . Arthritis   . Sarcoidosis     Hx:of  . Diabetic retinopathy     Hx: of bilateral  . Edema   . Hyperlipidemia   . 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 Atherosclerosis of native arteries of the extremities, unspecified   . Eczema     Hx: of  . Insomnia   . Seasonal allergies   . Chronic kidney disease     was on dialysis in July 2011 and then stopped and restarted may 2014  Started on Hemo 3 days  a week.   Marland Kitchen History of blood transfusion     with heart surgery  . Coronary atherosclerosis of  native coronary artery   . Atherosclerosis of native arteries of the extremities, unspecified    Past Surgical History  Past Surgical History  Procedure Laterality Date  . Cardiac surgery      total of 6 surgeries, 5 related to mrsa  . Coronary artery bypass graft  06/2009  . Cataract surgery      Hx: of  . Debridements      Hx: of secondary to MRSA  . Av fistula placement Right 10/11/2012    Procedure: ARTERIOVENOUS (AV) FISTULA CREATION;  Surgeon: Rosetta Posner, MD;  Location: [place];  Service: Vascular;  Laterality: Right;  . Insertion of dialysis catheter Left   . Capd insertion N/A 11/28/2012    Procedure: LAPAROSCOPIC PERITONEAL DIALYSIS CATHETER PLACEMENT;  Surgeon: Ralene Ok, MD;  Location: [place];  Service: General;  Laterality: N/A;   Family History  Family History  Problem Relation Age of Onset  . COPD Mother   . COPD Father    Social History  reports that he has never smoked. He  has never used smokeless tobacco. He reports that he does not drink alcohol or use illicit drugs. Allergies  Allergies  Allergen Reactions  . Flu Virus Vaccine     Gets the flu, patient has bad reaction to the flu vaccine.  . Lipitor [Atorvastatin]     Leg cramps   Home medications Prior to Admission medications   Medication Sig Start Date End Date Taking? Authorizing Provider  aspirin 325 MG tablet Take 325 mg by mouth daily.   Yes Historical Provider, MD  b complex-vitamin c-folic acid (NEPHRO-VITE) 0.8 MG TABS Take 1 tablet by mouth at bedtime. 12/07/12  Yes Tiffany L Reed, DO  calcium carbonate (TUMS - DOSED IN MG ELEMENTAL CALCIUM) 500 MG chewable tablet Chew 1 tablet by mouth 3 (three) times daily with meals.   Yes Historical Provider, MD  Calcium Carbonate-Vitamin D (CALCIUM + D PO) Take 1 tablet by mouth daily.    Yes Historical Provider, MD  febuxostat (ULORIC) 40 MG tablet Take 40 mg by mouth daily.   Yes Historical Provider, MD  furosemide (LASIX) 40 MG tablet Take 40 mg by mouth  2 (two) times daily.   Yes Historical Provider, MD  gentamicin cream (GARAMYCIN) 0.1 % Apply 1 application topically daily.   Yes Historical Provider, MD  guaiFENesin (MUCINEX) 600 MG 12 hr tablet Take 600 mg by mouth 2 (two) times daily.   Yes Historical Provider, MD  insulin aspart (NOVOLOG) 100 UNIT/ML injection Inject 2-8 Units into the skin 3 (three) times daily with meals. Take none if blood sugar < 150, 151-200 2 units, 201-250 4 units, 251-300 6 units, 301-350 8 units   Yes Historical Provider, MD  insulin glargine (LANTUS) 100 UNIT/ML injection Inject 0.3 mLs (30 Units total) into the skin every morning. 10/27/12  Yes Tiffany L Reed, DO  lactulose (CHRONULAC) 10 GM/15ML solution Take 10 g by mouth every 4 (four) hours as needed for mild constipation.   Yes Historical Provider, MD  levothyroxine (SYNTHROID, LEVOTHROID) 112 MCG tablet Take 112 mcg by mouth daily.   Yes Historical Provider, MD  metoprolol tartrate (LOPRESSOR) 25 MG tablet Take 1 tablet (25 mg total) by mouth 2 (two) times daily. 11/07/12  Yes Delfina Redwood, MD  PERITONEAL DIALYSIS SOLUTIONS IP Inject 2,700 Units into the peritoneum 6 (six) times daily.   Yes Historical Provider, MD  potassium chloride SA (K-DUR,KLOR-CON) 20 MEQ tablet Take 20 mEq by mouth daily.   Yes Historical Provider, MD  predniSONE (DELTASONE) 1 MG tablet Take 1 mg by mouth 2 (two) times daily.   Yes Historical Provider, MD  Pseudoephedrine-DM-GG (TUSSIN COLD/COUGH PO) Take 5 mLs by mouth every 4 (four) hours as needed (cough).   Yes Historical Provider, MD   Liver Function Tests  Recent Labs Lab 07/16/13 07/17/13 0724  AST 25 20  ALT 21 16  ALKPHOS 126* 99  BILITOT 0.3 0.4  PROT 6.6 5.7*  ALBUMIN 2.3* 1.9*   No results found for this basename: LIPASE, AMYLASE,  in the last 168 hours CBC  Recent Labs Lab 07/16/13 07/17/13 0724  WBC 14.0* 10.4  NEUTROABS 11.0* 7.6  HGB 12.9* 11.8*  HCT 37.8* 35.0*  MCV 94.5 95.6  PLT 300 924   Basic  Metabolic Panel  Recent Labs Lab 07/16/13 07/17/13 0724  NA 135* 135*  K 3.7 3.2*  CL 92* 95*  CO2 26 28  GLUCOSE 360* 298*  BUN 28* 28*  CREATININE 2.49* 2.58*  CALCIUM 7.9* 7.6*  Exam  Blood pressure 130/72, pulse 69, temperature 98.5 F (36.9 C), temperature source Oral, resp. rate 18, height 5\' 10"  (1.778 m), weight 102.513 kg (226 lb), SpO2 95.00%. Alert, no distress, calm No rash, cyanosis or gangrene Sclera anicteric, throat clear No jvd Chest clear bilat RRR no MRG Abd obese, soft, nontender, PD exit site erythematous mild with no drainage expressible Ext with bilat pitting edema pretibial Neuro is nf, ox3  UA - negative CT abd/pelvis (noncontrast) > no acute findings, air in abd from PD CXR - no acute disease  Assessment: 1 Fever- unclear etiology, not toxic, workup neg so far. Will have PD fluid sent for cell ct and gram stain/cx today 2 ESRD on PD 3 HTN/volume- vol excess, LE edema, on MTP and lasix at home, continue here 4 DM on insulin 5 CAD hx CABG 6 Gout   Plan- Send PD fluid for GS, culture, cell ct; f/u blood cx's, PD orders written, pull volume   [name] MD (pgr) [phone]    ([phone] 07/17/2013, 1:14 PM

## 2013-07-17 NOTE — Progress Notes (Signed)
Pt to transfer to 6E02 Report called to [facility]. Pt receiving peritoneal dialysis currently in room, will transfer after procedure. Pt aware of transfer.

## 2013-07-17 NOTE — ED Notes (Signed)
Patient upset about the wait.  States, "this is a joke."  Apologize for the wait.  Explained to patient the triage process and acuity process

## 2013-07-17 NOTE — Progress Notes (Signed)
[facility] PROGRESS NOTE  Jack Chung [id number] DOB: [birth year] DOA: 07/17/2013 PCP: Hollace Kinnier, DO  HPI/Subjective: Jack Chung is a 73 yo male with PMH of ESRD on peritoneal dialysis, CAD status post CABG, Diabetes, CHF, CAD, HTN and gout that presents to the ED with fever.  Pt has had subjective fever previous days, with dry cough previous 4 days.  Pt has experienced left flank pain but denies any trauma  Pt looks well.  Pt stated the only reason he is here is because "my wife made me"  Pt denies any N/V or diarrhea, chest pain, and shortness of breath.  Negative CVA tenderness.  Pt only experiences cough when lying flat and states that the cough is dry.   Assessment/Plan: Fever -Pt presented to the ED with fever of 101 due to unknown etiology -Blood Cx is pending, influenza panels negative. -Peritoneal fluid culture and Gram stain ordered -Peritoneal fluid cell count with differential ordered -CT of abdomen and pelvis w/o contrast showed free fluid likely due to peritoneal dialysis and no acute findings -Chest X-ray showed no airspace disease or effusion -Pt started on IV Cefepime, Zosyn and Voncocin in ED, currently on IV Cefepime -Continue to monitor for symptom improvement and check CBC in AM  Uncontrolled Diabetes -Pt has PMH of type 2-unspecified DM with cap blood glucose of 248 -Pt currently on SSI with Lantus 30 units q day -Have ordered A1c -Continue with monitor on sliding scale insulin  ESRD on dialysis -Pt has PMH of ESRD and is currently on peritoneal dialysis at home -Cr currently 2.58 -Consulted Dr. Melvia Heaps with nephrology, scheduled for dialysis on 2/10 -Will continue to monitor and check BMP in AM  Gout -Continue pt on at-home dose of allopurinol  Anemia -Likely secondary to ESRD -Continue to monitor heck CBC in AM  Hypothyroidism -Continue pt on synthroid 112 mcg  Hypertension -Continue pt on at-home dose of lasix and  metoprolol     DVT Prophylaxis:  SQ Lovenox  Code Status: Full Code Family Communication: Spoke with pt and wife during examination Disposition Plan: Remain Inpatient   Consultants:  Nephrology  Procedures:  Abdominal and pelvic CT w/o contrast  Antibiotics:  IV Cefepime  Objective: Filed Vitals:   07/16/13 2326 07/17/13 0226 07/17/13 0314 07/17/13 0552  BP: 136/83 144/68 126/63 130/72  Pulse: 82 85 72 69  Temp: 100.6 F (38.1 C) 101 F (38.3 C) 100.9 F (38.3 C) 98.5 F (36.9 C)  TempSrc: Oral Oral Oral Oral  Resp: 18 20 18 18   Height: 5\' 10"  (1.778 m)     Weight: 102.513 kg (226 lb)     SpO2: 99% 99% 94% 95%    Intake/Output Summary (Last 24 hours) at 07/17/13 1130 Last data filed at 07/17/13 0600  Gross per 24 hour  Intake     50 ml  Output      0 ml  Net     50 ml   Filed Weights   07/16/13 2326  Weight: 102.513 kg (226 lb)    Exam: General: Well developed, well nourished, NAD, appears stated age  HEENT:  PERRLA, EOMI, Anicteic Sclera. No pharyngeal erythema or exudates  Neck: Supple, No masses felt Cardiovascular: RRR, S1 S2 auscultated, no rubs, murmurs or gallops.   Respiratory: Clear to auscultation bilaterally with equal chest rise  Abdomen: Soft, nontender, nondistended, + bowel sounds, no CVA tenderness  Extremities: warm dry without cyanosis clubbing.  +3 pitting edema in lower extremities, ulcer present on  right 1st metatarsal  Neuro: AAOx3, cranial nerves grossly intact. Strength 5/5 in upper and lower extremities  Skin: Without rashes exudates or nodules.   Psych: Normal affect and demeanor with intact judgement and insight   Data Reviewed: Basic Metabolic Panel:  Recent Labs Lab 07/16/13 07/17/13 0724  NA 135* 135*  K 3.7 3.2*  CL 92* 95*  CO2 26 28  GLUCOSE 360* 298*  BUN 28* 28*  CREATININE 2.49* 2.58*  CALCIUM 7.9* 7.6*   Liver Function Tests:  Recent Labs Lab 07/16/13 07/17/13 0724  AST 25 20  ALT 21 16   ALKPHOS 126* 99  BILITOT 0.3 0.4  PROT 6.6 5.7*  ALBUMIN 2.3* 1.9*   CBC:  Recent Labs Lab 07/16/13 07/17/13 0724  WBC 14.0* 10.4  NEUTROABS 11.0* 7.6  HGB 12.9* 11.8*  HCT 37.8* 35.0*  MCV 94.5 95.6  PLT 300 244   BNP (last 3 results)  Recent Labs  10/30/12 0030  PROBNP 17301.0*   CBG:  Recent Labs Lab 07/17/13 0754  GLUCAP 248*    No results found for this or any previous visit (from the past 240 hour(s)).   Studies: Ct Abdomen Pelvis Wo Contrast  07/17/2013   CLINICAL DATA:  Intermittent left flank pain.  Fever  EXAM: CT ABDOMEN AND PELVIS WITHOUT CONTRAST  TECHNIQUE: Multidetector CT imaging of the abdomen and pelvis was performed following the standard protocol without intravenous contrast.  COMPARISON:  None.  FINDINGS: There is mild atelectasis and calcified nodularity at the right lung base. No acute findings. No pericardial fluid.  There is a tiny pockets of intraperitoneal gas collecting along the ventral peritoneal surface (image 29, series 2) within the left upper quadrant for example. There is a moderate amount of intraperitoneal free fluid along the margin of the liver and spleen. Both these findings are likely related to the peritoneal dialysis. There is a peritoneal dialysis catheter entering the left central abdomen with tip in the left pericolic gutter.  The liver appears normal on this noncontrast exam. The gallbladder is collapsed. The pancreas is atrophic. The spleen is small. The adrenal glands are normal. Kidneys are small.  Stomach, small bowel, and colon are unremarkable.  Abdominal aorta is normal caliber. There intimal calcifications of the aorta. No retroperitoneal periportal lymphadenopathy.  Small free fluid the pelvis. The bladder prostate gland are normal. No pelvic lymphadenopathy. No aggressive osseous lesion.  IMPRESSION: 1. Intraperitoneal free fluid a small amount of intraperitoneal air are likely related to peritoneal dialysis. 2. No acute  findings evident on this noncontrast exam.   Electronically Signed   By: Suzy Bouchard M.D.   On: 07/17/2013 09:13   Dg Chest 2 View  07/16/2013   CLINICAL DATA:  Cough.  Fever.  EXAM: CHEST  2 VIEW  COMPARISON:  DG CHEST 2 VIEW dated 11/30/2012  FINDINGS: Unchanged cardiomegaly. The left IJ dialysis catheter is been removed. Aortic arch atherosclerosis. Median sternotomy wire is present. There is no airspace disease or effusion.  IMPRESSION: Mild cardiomegaly without failure.  No acute abnormality.   Electronically Signed   By: Dereck Ligas M.D.   On: 07/16/2013 23:55    Scheduled Meds: . allopurinol  100 mg Oral BID  . aspirin  325 mg Oral Daily  . calcium-vitamin D  1 tablet Oral Q breakfast  . ceFEPime (MAXIPIME) IV  1 g Intravenous Q24H  . enoxaparin (LOVENOX) injection  30 mg Subcutaneous Q24H  . furosemide  40 mg Oral BID  .  insulin aspart  0-9 Units Subcutaneous TID WC  . insulin glargine  30 Units Subcutaneous q morning - 10a  . levothyroxine  112 mcg Oral QAC breakfast  . loratadine  10 mg Oral Daily  . metoprolol tartrate  25 mg Oral BID  . multivitamin  1 tablet Oral QHS  . potassium chloride SA  20 mEq Oral Daily  . predniSONE  1 mg Oral BID  . sodium chloride  3 mL Intravenous Q12H  . sodium chloride  3 mL Intravenous Q12H   Continuous Infusions:   Principal Problem:   Fever Active Problems:   Type II or unspecified type diabetes mellitus with renal manifestations, uncontrolled   Left flank pain   ESRD on dialysis   Gout   CAD (coronary artery disease)    Jack Proud PA-S [facility] Pager [phone]. If 7PM-7AM, please contact night-coverage at [email or web address], password [facility] 07/17/2013, 11:30 AM  LOS: 0 days    Addendum  Patient seen and examined, chart and data base reviewed.  I agree with the above assessment and plan.  For full details please see Mrs. Imogene Burn PA note.  I reviewed and addended the above note as needed   Birdie Hopes, MD [facility] Pager: [phone] 07/17/2013, 5:50 PM

## 2013-07-17 NOTE — Progress Notes (Signed)
Received report from RN in ED. Awaiting patient's arrival.

## 2013-07-17 NOTE — Progress Notes (Signed)
Pt wife called and notified of new room.

## 2013-07-17 NOTE — Progress Notes (Signed)
  Pt admitted to the unit. Pt is stable, alert and oriented per baseline. Oriented to room, staff, and call bell. Educated to call for any assistance. Bed in lowest position, call bell within reach- will continue to monitor. 

## 2013-07-17 NOTE — ED Notes (Signed)
Patient presents to ed  To with c/o left lower flank pain , however states he isn't having any pain at present.

## 2013-07-17 NOTE — Progress Notes (Signed)
Notified for MD to come see patient.

## 2013-07-17 NOTE — Progress Notes (Signed)
Patient transferred to 6East room 02.  Patient oriented to unit and room, call bell and phone.  Patient verbalized understanding.  Telemetry box 6E02 placed on patient, CCMD notified,

## 2013-07-17 NOTE — Progress Notes (Signed)
RN has reviewed Med list and determined that pharmacy tech needs to visit patient in the morning. Pharmacy has been contacted and plans to see patient early this am.

## 2013-07-17 NOTE — Progress Notes (Signed)
Flu PCR pending.

## 2013-07-17 NOTE — Progress Notes (Signed)
Utilization review completed.  

## 2013-07-17 NOTE — Progress Notes (Signed)
ANTIBIOTIC CONSULT NOTE - INITIAL  Pharmacy Consult for Vancomycin and Cefepime Indication: fevers  Allergies  Allergen Reactions  . Flu Virus Vaccine     Gets the flu, patient has bad reaction to the flu vaccine.  . Lipitor [Atorvastatin]     Leg cramps    Patient Measurements: Height: 5\' 10"  (177.8 cm) Weight: 226 lb (102.513 kg) IBW/kg (Calculated) : 73  Vital Signs: Temp: 98.5 F (36.9 C) (02/10 0552) Temp src: Oral (02/10 0552) BP: 130/72 mmHg (02/10 0552) Pulse Rate: 69 (02/10 0552) Intake/Output from previous day:   Intake/Output from this shift:    Labs:  Recent Labs  07/16/13  WBC 14.0*  HGB 12.9*  PLT 300  CREATININE 2.49*   Estimated Creatinine Clearance: 33.1 ml/min (by C-G formula based on Cr of 2.49). No results found for this basename: VANCOTROUGH, VANCOPEAK, VANCORANDOM, GENTTROUGH, GENTPEAK, GENTRANDOM, TOBRATROUGH, TOBRAPEAK, TOBRARND, AMIKACINPEAK, AMIKACINTROU, AMIKACIN,  in the last 72 hours   Microbiology: No results found for this or any previous visit (from the past 720 hour(s)).  Medical History: Past Medical History  Diagnosis Date  . Hypertension   . Diabetes mellitus   . Hypothyroidism   . CHF (congestive heart failure)   . Anemia   . Blind left eye   . Sleep apnea     not on CPAP  . Coronary artery disease     CABG x 11 Jun 2009.  MRSA infections of incsions  . Arthritis   . Sarcoidosis     Hx:of  . Diabetic retinopathy     Hx: of bilateral  . Edema   . Hyperlipidemia   . 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 Atherosclerosis of native arteries of the extremities, unspecified   . Eczema     Hx: of  . Insomnia   . Seasonal allergies   . Chronic kidney disease     was on dialysis in July 2011 and then stopped and restarted may 2014  Started on Hemo 3 days  a week.   Marland Kitchen History of blood transfusion     with heart surgery  . Coronary atherosclerosis of native coronary artery   . Atherosclerosis  of native arteries of the extremities, unspecified     Medications:  Med History pending  Assessment: 73 yo male with fevers, possible peritonitis, for empiric antibiotics.  Currently doing peritoneal dialysis at home.  Goal of Therapy:  Vancomycin trough level 15-20 mcg/ml  Plan:  Vancomycin 1 g IV now for total of 2 g this morning Cefepime 1 g IV q48h   Nastassia Bazaldua, Bronson Curb 07/17/2013,6:10 AM

## 2013-07-18 DIAGNOSIS — I509 Heart failure, unspecified: Secondary | ICD-10-CM

## 2013-07-18 DIAGNOSIS — D649 Anemia, unspecified: Secondary | ICD-10-CM

## 2013-07-18 DIAGNOSIS — I2510 Atherosclerotic heart disease of native coronary artery without angina pectoris: Secondary | ICD-10-CM

## 2013-07-18 LAB — CBC
HEMATOCRIT: 35.7 % — AB (ref 39.0–52.0)
Hemoglobin: 12.1 g/dL — ABNORMAL LOW (ref 13.0–17.0)
MCH: 32.2 pg (ref 26.0–34.0)
MCHC: 33.9 g/dL (ref 30.0–36.0)
MCV: 94.9 fL (ref 78.0–100.0)
PLATELETS: 291 10*3/uL (ref 150–400)
RBC: 3.76 MIL/uL — ABNORMAL LOW (ref 4.22–5.81)
RDW: 13.1 % (ref 11.5–15.5)
WBC: 10.6 10*3/uL — AB (ref 4.0–10.5)

## 2013-07-18 LAB — BASIC METABOLIC PANEL
BUN: 32 mg/dL — AB (ref 6–23)
CALCIUM: 8 mg/dL — AB (ref 8.4–10.5)
CO2: 26 mEq/L (ref 19–32)
Chloride: 95 mEq/L — ABNORMAL LOW (ref 96–112)
Creatinine, Ser: 2.78 mg/dL — ABNORMAL HIGH (ref 0.50–1.35)
GFR, EST AFRICAN AMERICAN: 25 mL/min — AB (ref >90)
GFR, EST NON AFRICAN AMERICAN: 22 mL/min — AB (ref >90)
Glucose, Bld: 182 mg/dL — ABNORMAL HIGH (ref 70–99)
Potassium: 3.5 mEq/L — ABNORMAL LOW (ref 3.7–5.3)
Sodium: 136 mEq/L — ABNORMAL LOW (ref 137–147)

## 2013-07-18 LAB — GLUCOSE, CAPILLARY
GLUCOSE-CAPILLARY: 211 mg/dL — AB (ref 70–99)
GLUCOSE-CAPILLARY: 157 mg/dL — AB (ref 70–99)
Glucose-Capillary: 197 mg/dL — ABNORMAL HIGH (ref 70–99)

## 2013-07-18 LAB — HEMOGLOBIN A1C
Hgb A1c MFr Bld: 12.6 % — ABNORMAL HIGH (ref <5.7)
MEAN PLASMA GLUCOSE: 315 mg/dL — AB (ref <117)

## 2013-07-18 LAB — PATHOLOGIST SMEAR REVIEW: Path Review: REACTIVE

## 2013-07-18 NOTE — ED Provider Notes (Signed)
CSN: [id number]     Arrival date & time 07/16/13  2320 History   First MD Initiated Contact with Patient 07/17/13 0254     Chief Complaint  Patient presents with  . Cough  . 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 He denies any abdominal pain. No vomiting. Does have yellow phlegm productive cough without hemoptysis. He denies any chest pain. Mild shortness of breath. History of CHF denies any weight gain or lower extremity swelling. Symptoms mild to moderate in severity.   Past Medical History  Diagnosis Date  . Hypertension   . Diabetes mellitus   . Hypothyroidism   . CHF (congestive heart failure)   . Anemia   . Blind left eye   . Sleep apnea     not on CPAP  . Coronary artery disease     CABG x 11 Jun 2009.  MRSA infections of incsions  . Arthritis   . Sarcoidosis     Hx:of  . Diabetic retinopathy     Hx: of bilateral  . Edema   . Hyperlipidemia   . 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 Atherosclerosis of native arteries of the extremities, unspecified   . Eczema     Hx: of  . Insomnia   . Seasonal allergies   . ESRD on dialysis     Was on dialysis in July 2011 and then stopped and restarted May 2014. He then transitioned to PD in Aug 2014. He does PD on cycler when at home and CAPD 6 exchanges per day when travelling. His wife is "in charge" and does the exchanges.   . History of blood transfusion     with heart surgery  . Coronary atherosclerosis of native coronary artery   . Atherosclerosis of native arteries of the extremities, unspecified    Past Surgical History  Procedure Laterality Date  . Cardiac surgery      total of 6 surgeries, 5 related to mrsa  . Coronary artery bypass graft  06/2009  . Cataract surgery      Hx: of  . Debridements      Hx: of secondary to  MRSA  . Av fistula placement Right 10/11/2012    Procedure: ARTERIOVENOUS (AV) FISTULA CREATION;  Surgeon: Larina Earthly, MD;  Location: [facility] OR;  Service: Vascular;  Laterality: Right;  . Insertion of dialysis catheter Left   . Capd insertion N/A 11/28/2012    Procedure: LAPAROSCOPIC PERITONEAL DIALYSIS CATHETER PLACEMENT;  Surgeon: Axel Filler, MD;  Location: [facility] OR;  Service: General;  Laterality: N/A;   Family History  Problem Relation Age of Onset  . COPD Mother   . COPD Father    History  Substance Use Topics  . Smoking status: Never Smoker   . Smokeless tobacco: Never Used  . Alcohol Use: No    Review of Systems  Constitutional: Positive for fever.  Respiratory: Positive for cough. Negative for shortness of breath.   Cardiovascular: Negative for chest pain.  Gastrointestinal: Negative for vomiting and abdominal pain.  Genitourinary: Negative for flank pain.  Musculoskeletal: Negative for back pain.  Skin: Negative for rash.  Neurological: Negative for weakness and headaches.  All other systems reviewed and are negative.      Allergies  Flu virus vaccine and Lipitor  Home Medications   Current Outpatient Rx  Name  Route  Sig  Dispense  Refill  . aspirin 325 MG tablet   Oral   Take 325 mg by mouth daily.         Marland Kitchen b complex-vitamin c-folic acid (NEPHRO-VITE) 0.8 MG TABS   Oral   Take 1 tablet by mouth at bedtime.   90 tablet   3   . calcium carbonate (TUMS - DOSED IN MG ELEMENTAL CALCIUM) 500 MG chewable tablet   Oral   Chew 1 tablet by mouth 3 (three) times daily with meals.         . Calcium Carbonate-Vitamin D (CALCIUM + D PO)   Oral   Take 1 tablet by mouth daily.          . febuxostat (ULORIC) 40 MG tablet   Oral   Take 40 mg by mouth daily.         . furosemide (LASIX) 40 MG tablet   Oral   Take 40 mg by mouth 2 (two) times daily.         Marland Kitchen gentamicin cream (GARAMYCIN) 0.1 %   Topical   Apply 1 application topically daily.          Marland Kitchen guaiFENesin (MUCINEX) 600 MG 12 hr tablet   Oral   Take 600 mg by mouth 2 (two) times daily.         . insulin aspart (NOVOLOG) 100 UNIT/ML injection   Subcutaneous   Inject 2-8 Units into the skin 3 (three) times daily with meals. Take none if blood sugar < 150, 151-200 2 units, 201-250 4 units, 251-300 6 units, 301-350 8 units         . insulin glargine (LANTUS) 100 UNIT/ML injection   Subcutaneous   Inject 0.3 mLs (30 Units total) into the skin every morning.   10 mL   3   . lactulose (CHRONULAC) 10 GM/15ML solution   Oral   Take 10 g by mouth every 4 (four) hours as needed for mild constipation.         Marland Kitchen levothyroxine (SYNTHROID, LEVOTHROID) 112 MCG tablet   Oral   Take 112 mcg by mouth daily.         . metoprolol tartrate (LOPRESSOR) 25 MG tablet   Oral   Take 1 tablet (25 mg total) by mouth 2 (two) times daily.   60 tablet   0   . PERITONEAL DIALYSIS SOLUTIONS IP   Intraperitoneal   Inject 2,700 Units into the peritoneum 6 (six) times daily.         . potassium chloride SA (K-DUR,KLOR-CON) 20 MEQ tablet   Oral   Take 20 mEq by mouth daily.         . predniSONE (DELTASONE) 1 MG tablet   Oral   Take 1 mg by mouth 2 (two) times daily.         . Pseudoephedrine-DM-GG (TUSSIN COLD/COUGH PO)   Oral   Take 5 mLs by mouth every 4 (four) hours as needed (cough).          BP 118/68  Pulse 76  Temp(Src) 98.6 F (37 C) (Oral)  Resp 18  Ht 5\' 10"  (1.778 m)  Wt 244 lb 14.9 oz (111.1 kg)  BMI 35.14 kg/m2  SpO2 98% Physical Exam  Constitutional: He is oriented to person, place, and time. He appears well-developed and well-nourished.  HENT:  Head: Normocephalic and atraumatic.  Eyes: EOM are normal. Pupils are equal, round, and reactive to light. No scleral icterus.  Neck: Neck supple.  Cardiovascular: Normal rate, regular rhythm and intact distal pulses.   Pulmonary/Chest: Effort normal. No respiratory distress.  Mildly decreased bilateral  breath sounds without rhonchi, rales or wheezes  Abdominal: Soft. Bowel sounds are normal. He exhibits no distension. There is no tenderness. There is no rebound and no guarding.  Musculoskeletal: Normal range of motion. He exhibits no tenderness.  Neurological: He is alert and oriented to person, place, and time.  Skin: Skin is warm and dry.    ED Course  Procedures (including critical care time) Labs Review Results for orders placed during the hospital encounter of 07/17/13  CULTURE, BLOOD (ROUTINE X 2)      Result Value Ref Range   Specimen Description BLOOD RIGHT ARM     Special Requests BOTTLES DRAWN AEROBIC AND ANAEROBIC 10CC EACH     Culture  Setup Time       Value: 07/17/2013 08:32     Performed at [facility]   Culture       Value:        BLOOD CULTURE RECEIVED NO GROWTH TO DATE CULTURE WILL BE HELD FOR 5 DAYS BEFORE ISSUING A FINAL NEGATIVE REPORT     Performed at [facility]   Report Status PENDING    CULTURE, BLOOD (ROUTINE X 2)      Result Value Ref Range   Specimen Description BLOOD RIGHT HAND     Special Requests BOTTLES DRAWN AEROBIC ONLY 10CC     Culture  Setup Time       Value: 07/17/2013 08:33     Performed at [facility]   Culture       Value:        BLOOD CULTURE RECEIVED NO GROWTH TO DATE CULTURE WILL BE HELD FOR 5 DAYS BEFORE ISSUING A FINAL NEGATIVE REPORT     Performed at [facility]   Report Status PENDING    GRAM STAIN      Result Value Ref Range   Specimen Description PERITONEAL CAVITY FLUID     Special Requests NONE     Gram Stain       Value: RARE WBC PRESENT, PREDOMINANTLY MONONUCLEAR     NO ORGANISMS SEEN   Report Status 07/17/2013 FINAL    BODY FLUID CULTURE      Result Value Ref Range   Specimen Description PERITONEAL CAVITY FLUID     Special Requests NONE     Gram Stain       Value: RARE WBC PRESENT, PREDOMINANTLY MONONUCLEAR     NO ORGANISMS SEEN     Performed at [facility]     Performed  at [facility]   Culture       Value: NO GROWTH     Performed at [facility]   Report Status PENDING    CBC WITH DIFFERENTIAL      Result Value Ref Range   WBC 14.0 (*) 4.0 - 10.5 K/uL   RBC 4.00 (*) 4.22 - 5.81 MIL/uL   Hemoglobin 12.9 (*) 13.0 - 17.0 g/dL   HCT 37.8 (*) 39.0 - 52.0 %   MCV 94.5  78.0 - 100.0 fL   MCH 32.3  26.0 - 34.0 pg   MCHC 34.1  30.0 - 36.0 g/dL   RDW 12.9  11.5 - 15.5 %   Platelets 300  150 - 400 K/uL   Neutrophils Relative % 79 (*) 43 - 77 %   Neutro Abs  11.0 (*) 1.7 - 7.7 K/uL   Lymphocytes Relative 9 (*) 12 - 46 %   Lymphs Abs 1.2  0.7 - 4.0 K/uL   Monocytes Relative 10  3 - 12 %   Monocytes Absolute 1.4 (*) 0.1 - 1.0 K/uL   Eosinophils Relative 2  0 - 5 %   Eosinophils Absolute 0.3  0.0 - 0.7 K/uL   Basophils Relative 0  0 - 1 %   Basophils Absolute 0.0  0.0 - 0.1 K/uL  COMPREHENSIVE METABOLIC PANEL      Result Value Ref Range   Sodium 135 (*) 137 - 147 mEq/L   Potassium 3.7  3.7 - 5.3 mEq/L   Chloride 92 (*) 96 - 112 mEq/L   CO2 26  19 - 32 mEq/L   Glucose, Bld 360 (*) 70 - 99 mg/dL   BUN 28 (*) 6 - 23 mg/dL   Creatinine, Ser 2.49 (*) 0.50 - 1.35 mg/dL   Calcium 7.9 (*) 8.4 - 10.5 mg/dL   Total Protein 6.6  6.0 - 8.3 g/dL   Albumin 2.3 (*) 3.5 - 5.2 g/dL   AST 25  0 - 37 U/L   ALT 21  0 - 53 U/L   Alkaline Phosphatase 126 (*) 39 - 117 U/L   Total Bilirubin 0.3  0.3 - 1.2 mg/dL   GFR calc non Af Amer 25 (*) >90 mL/min   GFR calc Af Amer 29 (*) >90 mL/min  URINALYSIS, ROUTINE W REFLEX MICROSCOPIC      Result Value Ref Range   Color, Urine YELLOW  YELLOW   APPearance CLOUDY (*) CLEAR   Specific Gravity, Urine 1.017  1.005 - 1.030   pH 5.5  5.0 - 8.0   Glucose, UA >1000 (*) NEGATIVE mg/dL   Hgb urine dipstick MODERATE (*) NEGATIVE   Bilirubin Urine NEGATIVE  NEGATIVE   Ketones, ur NEGATIVE  NEGATIVE mg/dL   Protein, ur 30 (*) NEGATIVE mg/dL   Urobilinogen, UA 0.2  0.0 - 1.0 mg/dL   Nitrite NEGATIVE  NEGATIVE    Leukocytes, UA MODERATE (*) NEGATIVE  INFLUENZA PANEL BY PCR (TYPE A & B, H1N1)      Result Value Ref Range   Influenza A By PCR NEGATIVE  NEGATIVE   Influenza B By PCR NEGATIVE  NEGATIVE   H1N1 flu by pcr NOT DETECTED  NOT DETECTED  URINE MICROSCOPIC-ADD ON      Result Value Ref Range   Squamous Epithelial / LPF RARE  RARE   WBC, UA 0-2  <3 WBC/hpf   RBC / HPF 0-2  <3 RBC/hpf   Bacteria, UA RARE  RARE   Casts GRANULAR CAST (*) NEGATIVE   Urine-Other FEW YEAST    COMPREHENSIVE METABOLIC PANEL      Result Value Ref Range   Sodium 135 (*) 137 - 147 mEq/L   Potassium 3.2 (*) 3.7 - 5.3 mEq/L   Chloride 95 (*) 96 - 112 mEq/L   CO2 28  19 - 32 mEq/L   Glucose, Bld 298 (*) 70 - 99 mg/dL   BUN 28 (*) 6 - 23 mg/dL   Creatinine, Ser 2.58 (*) 0.50 - 1.35 mg/dL   Calcium 7.6 (*) 8.4 - 10.5 mg/dL   Total Protein 5.7 (*) 6.0 - 8.3 g/dL   Albumin 1.9 (*) 3.5 - 5.2 g/dL   AST 20  0 - 37 U/L   ALT 16  0 - 53 U/L   Alkaline Phosphatase 99  39 -  117 U/L   Total Bilirubin 0.4  0.3 - 1.2 mg/dL   GFR calc non Af Amer 24 (*) >90 mL/min   GFR calc Af Amer 27 (*) >90 mL/min  CBC WITH DIFFERENTIAL      Result Value Ref Range   WBC 10.4  4.0 - 10.5 K/uL   RBC 3.66 (*) 4.22 - 5.81 MIL/uL   Hemoglobin 11.8 (*) 13.0 - 17.0 g/dL   HCT 35.0 (*) 39.0 - 52.0 %   MCV 95.6  78.0 - 100.0 fL   MCH 32.2  26.0 - 34.0 pg   MCHC 33.7  30.0 - 36.0 g/dL   RDW 12.9  11.5 - 15.5 %   Platelets 244  150 - 400 K/uL   Neutrophils Relative % 73  43 - 77 %   Neutro Abs 7.6  1.7 - 7.7 K/uL   Lymphocytes Relative 14  12 - 46 %   Lymphs Abs 1.5  0.7 - 4.0 K/uL   Monocytes Relative 11  3 - 12 %   Monocytes Absolute 1.2 (*) 0.1 - 1.0 K/uL   Eosinophils Relative 2  0 - 5 %   Eosinophils Absolute 0.2  0.0 - 0.7 K/uL   Basophils Relative 0  0 - 1 %   Basophils Absolute 0.0  0.0 - 0.1 K/uL  GLUCOSE, CAPILLARY      Result Value Ref Range   Glucose-Capillary 248 (*) 70 - 99 mg/dL   Comment 1 Notify RN    GLUCOSE,  CAPILLARY      Result Value Ref Range   Glucose-Capillary 169 (*) 70 - 99 mg/dL   Comment 1 Notify RN    BODY FLUID CELL COUNT WITH DIFFERENTIAL      Result Value Ref Range   Fluid Type-FCT PERITONEAL CAVITY     Color, Fluid COLORLESS (*) YELLOW   Appearance, Fluid HAZY (*) CLEAR   WBC, Fluid 2  0 - 1000 cu mm   Neutrophil Count, Fluid TOO FEW TO COUNT, SMEAR AVAILABLE FOR REVIEW  0 - 25 %   Lymphs, Fluid OCCASIONAL     Monocyte-Macrophage-Serous Fluid FEW  50 - 90 %   Eos, Fluid 0    GLUCOSE, CAPILLARY      Result Value Ref Range   Glucose-Capillary 126 (*) 70 - 99 mg/dL  BASIC METABOLIC PANEL      Result Value Ref Range   Sodium 136 (*) 137 - 147 mEq/L   Potassium 3.5 (*) 3.7 - 5.3 mEq/L   Chloride 95 (*) 96 - 112 mEq/L   CO2 26  19 - 32 mEq/L   Glucose, Bld 182 (*) 70 - 99 mg/dL   BUN 32 (*) 6 - 23 mg/dL   Creatinine, Ser 2.78 (*) 0.50 - 1.35 mg/dL   Calcium 8.0 (*) 8.4 - 10.5 mg/dL   GFR calc non Af Amer 22 (*) >90 mL/min   GFR calc Af Amer 25 (*) >90 mL/min  CBC      Result Value Ref Range   WBC 10.6 (*) 4.0 - 10.5 K/uL   RBC 3.76 (*) 4.22 - 5.81 MIL/uL   Hemoglobin 12.1 (*) 13.0 - 17.0 g/dL   HCT 35.7 (*) 39.0 - 52.0 %   MCV 94.9  78.0 - 100.0 fL   MCH 32.2  26.0 - 34.0 pg   MCHC 33.9  30.0 - 36.0 g/dL   RDW 13.1  11.5 - 15.5 %   Platelets 291  150 - 400 K/uL  HEMOGLOBIN A1C      Result Value Ref Range   Hemoglobin A1C 12.6 (*) <5.7 %   Mean Plasma Glucose 315 (*) <117 mg/dL  GLUCOSE, CAPILLARY      Result Value Ref Range   Glucose-Capillary 159 (*) 70 - 99 mg/dL  GLUCOSE, CAPILLARY      Result Value Ref Range   Glucose-Capillary 211 (*) 70 - 99 mg/dL  GLUCOSE, CAPILLARY      Result Value Ref Range   Glucose-Capillary 197 (*) 70 - 99 mg/dL  PATHOLOGIST SMEAR REVIEW      Result Value Ref Range   Path Review REACTIVE MESOTHELIAL CELLS.    GLUCOSE, CAPILLARY      Result Value Ref Range   Glucose-Capillary 157 (*) 70 - 99 mg/dL   Ct Abdomen Pelvis Wo  Contrast  07/17/2013   CLINICAL DATA:  Intermittent left flank pain.  Fever  EXAM: CT ABDOMEN AND PELVIS WITHOUT CONTRAST  TECHNIQUE: Multidetector CT imaging of the abdomen and pelvis was performed following the standard protocol without intravenous contrast.  COMPARISON:  None.  FINDINGS: There is mild atelectasis and calcified nodularity at the right lung base. No acute findings. No pericardial fluid.  There is a tiny pockets of intraperitoneal gas collecting along the ventral peritoneal surface (image 29, series 2) within the left upper quadrant for example. There is a moderate amount of intraperitoneal free fluid along the margin of the liver and spleen. Both these findings are likely related to the peritoneal dialysis. There is a peritoneal dialysis catheter entering the left central abdomen with tip in the left pericolic gutter.  The liver appears normal on this noncontrast exam. The gallbladder is collapsed. The pancreas is atrophic. The spleen is small. The adrenal glands are normal. Kidneys are small.  Stomach, small bowel, and colon are unremarkable.  Abdominal aorta is normal caliber. There intimal calcifications of the aorta. No retroperitoneal periportal lymphadenopathy.  Small free fluid the pelvis. The bladder prostate gland are normal. No pelvic lymphadenopathy. No aggressive osseous lesion.  IMPRESSION: 1. Intraperitoneal free fluid a small amount of intraperitoneal air are likely related to peritoneal dialysis. 2. No acute findings evident on this noncontrast exam.   Electronically Signed   By: Suzy Bouchard M.D.   On: 07/17/2013 09:13   Dg Chest 2 View  07/16/2013   CLINICAL DATA:  Cough.  Fever.  EXAM: CHEST  2 VIEW  COMPARISON:  DG CHEST 2 VIEW dated 11/30/2012  FINDINGS: Unchanged cardiomegaly. The left IJ dialysis catheter is been removed. Aortic arch atherosclerosis. Median sternotomy wire is present. There is no airspace disease or effusion.  IMPRESSION: Mild cardiomegaly without  failure.  No acute abnormality.   Electronically Signed   By: Dereck Ligas M.D.   On: 07/16/2013 23:55   Tylenol, vancomycin and Zosyn IV  D/w DR Hal Hope, plan MED admit  MDM   Final diagnoses:  Fever  ESRD on dialysis  Left flank pain  Type II or unspecified type diabetes mellitus with renal manifestations, uncontrolled  Gout  CAD (coronary artery disease)  Anemia  CHF (congestive heart failure)  Sarcoidosis  Sleep apnea   Evaluated with labs and imaging reviewed as above. Treated with broad-spectrum antibiotics for presumed respiratory infection, no obvious infiltrate on chest x-ray. Influenza PCR pending. Medical admission.   No abdominal tenderness on serial examinations.     Teressa Lower, MD 07/18/13 [phone]

## 2013-07-18 NOTE — Progress Notes (Signed)
Inpatient Diabetes Program Recommendations  AACE/ADA: New Consensus Statement on Inpatient Glycemic Control (2013)  Target Ranges:  Prepandial:   less than 140 mg/dL      Peak postprandial:   less than 180 mg/dL (1-2 hours)      Critically ill patients:  140 - 180 mg/dL     Results for Jack Chung, Jack Chung (MRN [id number]) as of 07/18/2013 12:43  Ref. Range 07/17/2013 07:24  Hemoglobin A1C Latest Range: <5.7 % 12.6 (H)    **Spoke with patient about his elevated A1c of 12.6%.  Patient and his wife told me that they expected his A1c to be up b/c of all the glucose in the patient's PD fluid.  Patient told me he sees a PCP with [facility] and also sees Dr. Joelyn Oms with [facility].  Has access to insulin, insulin supplies, CBG meter, and CBG meter supplies.  Wife told me that she has to "nag" patient about checking his CBGs at home.  **No further learning needs identified at this time.  Will follow.  Wyn Quaker RN, MSN, CDE Diabetes Coordinator Inpatient Diabetes Program Team Pager: [phone] (8a-10p)

## 2013-07-18 NOTE — Discharge Summary (Addendum)
Physician Discharge Summary  Jack Chung TKW:[id number] DOB: August 18, 1940 DOA: 07/17/2013  PCP: Hollace Kinnier, DO  Admit date: 07/17/2013 Discharge date: 07/18/2013  Time spent: >30 minutes  Recommendations for Outpatient Follow-up:  Follow-up Information   Follow up with REED, TIFFANY, DO. (in 1week, call for appt upon discharge)    Specialty:  Geriatric Medicine   Contact information:   [address]. [place] [address] [phone]       Please follow up. (Renal as directed, call for appt upon discharge)        Discharge Diagnoses:  Principal Problem:   Fever Active Problems:   Type II or unspecified type diabetes mellitus with renal manifestations, uncontrolled   Left flank pain   ESRD on dialysis   Gout   CAD (coronary artery disease)   Discharge Condition:  Improved/stable  Diet recommendation: Renal, refined carbohydrate diet   Filed Weights   07/16/13 2326 07/17/13 2153  Weight: 102.513 kg (226 lb) 111.1 kg (244 lb 14.9 oz)    History of present illness:  Jack Chung is a 73 y.o. male with history of ESRD on peritoneal dialysis, diabetes mellitus, CAD, gout, hypertension presented to the ER because of fever. Patient has been having subjective sensation of fever and chills since last after noon. Over the last 4 days patient has been having cough. Denies any chest pain or shortness of breath. Since last evening patient has been having left flank pain but denies any fall trauma. Patient states that his dialysis fluid is clear. Chest x-ray does not show any infiltrates. Patient had blood cultures drawn and at this time has been started on empiric antibiotics and admitted for further management. Patient otherwise denies any nausea vomiting diarrhea. Denies any chest pain. He was admitted for further evaluation and management.     Hospital Course:  1. Fever - source is not clear. As discussed above upon admission patient had a chest x-ray which showed no acute  infiltrates, urinalysis was negative for infection, influenza panel negative and CT scan of abdomen and pelvis showed intraperitoneal free fluid small amount of air likely related to peritoneal dialysis otherwise no acute findings. Blood cultures were obtained and Patient was started on empiric antibiotics with Cefepime vancomycin. Peritoneal fluid was also sampled and to date culture shows no growth and no organisms seen on Gram stain only rare WBC is present. Patient has remained afebrile and hemodynamically stable overnight and WBC count is 10.6 today. He wants to go home. This was discussed with renal and Dr. Jonnie Finner does not suspect peritonitis and recommends no empiric antibiotics at this time. She is to followup with her PCP and renal outpatient for results of his pending cultures.   2. ESRD on peritoneal dialysis - renal was consulted while in the hospital, patient to continue his peritoneal dialysis upon discharge.  3. CAD status post CABG - denies any chest pain.  4. History of gout - on prednisone. Has had recent exacerbation and was on increased dose of prednisone last week.  5. Diabetes mellitus type 2, poorly controlled - A1c was done and elevated at 12.6, pt is to continue lantus and follow up PCP for close monitoring and adjustment of his insulin as clinically appropriate for improved vocal cords controlled. 6. 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 Hypothyroidism - continue Synthroid. 8. Hypertension - continue home medications. Patient is still on Lasix and potassium.    Procedures:  None  Consultations:  Renal  Discharge Exam: Filed Vitals:   07/18/13 1400  BP: 118/68  Pulse: 76  Temp: 98.6 F (37 C)  Resp: 18   Exam:  General: alert & oriented x 3 In NAD Cardiovascular: RRR, nl S1 s2 Respiratory: Decreased breath sounds at the bases, no crackles or wheezes the Abdomen: soft +BS NT/ND, no masses palpable Extremities: No cyanosis and no  edema    Discharge Instructions  Discharge Orders   Future Orders Complete By Expires   Diet renal 60/70-07-09-1198  As directed    Increase activity slowly  As directed        Medication List         aspirin 325 MG tablet  Take 325 mg by mouth daily.     b complex-vitamin c-folic acid 0.8 MG Tabs tablet  Take 1 tablet by mouth at bedtime.     CALCIUM + D PO  Take 1 tablet by mouth daily.     calcium carbonate 500 MG chewable tablet  Commonly known as:  TUMS - dosed in mg elemental calcium  Chew 1 tablet by mouth 3 (three) times daily with meals.     febuxostat 40 MG tablet  Commonly known as:  ULORIC  Take 40 mg by mouth daily.     furosemide 40 MG tablet  Commonly known as:  LASIX  Take 40 mg by mouth 2 (two) times daily.     gentamicin cream 0.1 %  Commonly known as:  GARAMYCIN  Apply 1 application topically daily.     guaiFENesin 600 MG 12 hr tablet  Commonly known as:  MUCINEX  Take 600 mg by mouth 2 (two) times daily.     insulin aspart 100 UNIT/ML injection  Commonly known as:  novoLOG  Inject 2-8 Units into the skin 3 (three) times daily with meals. Take none if blood sugar < 150, 151-200 2 units, 201-250 4 units, 251-300 6 units, 301-350 8 units     insulin glargine 100 UNIT/ML injection  Commonly known as:  LANTUS  Inject 0.3 mLs (30 Units total) into the skin every morning.     lactulose 10 GM/15ML solution  Commonly known as:  CHRONULAC  Take 10 g by mouth every 4 (four) hours as needed for mild constipation.     levothyroxine 112 MCG tablet  Commonly known as:  SYNTHROID, LEVOTHROID  Take 112 mcg by mouth daily.     metoprolol tartrate 25 MG tablet  Commonly known as:  LOPRESSOR  Take 1 tablet (25 mg total) by mouth 2 (two) times daily.     PERITONEAL DIALYSIS SOLUTIONS IP  Inject 2,700 Units into the peritoneum 6 (six) times daily.     potassium chloride SA 20 MEQ tablet  Commonly known as:  K-DUR,KLOR-CON  Take 20 mEq by mouth daily.      predniSONE 1 MG tablet  Commonly known as:  DELTASONE  Take 1 mg by mouth 2 (two) times daily.     TUSSIN COLD/COUGH PO  Take 5 mLs by mouth every 4 (four) hours as needed (cough).       Allergies  Allergen Reactions  . Flu Virus Vaccine     Gets the flu, patient has bad reaction to the flu vaccine.  . Lipitor [Atorvastatin]     Leg cramps       Follow-up Information   Follow up with REED, TIFFANY, DO. (in 1week, call for appt upon discharge)    Specialty:  Geriatric Medicine   Contact information:  [place]. [place] [address] [phone]       Please follow up. (Renal as directed, call for appt upon discharge)        The results of significant diagnostics from this hospitalization (including imaging, microbiology, ancillary and laboratory) are listed below for reference.    Significant Diagnostic Studies: Ct Abdomen Pelvis Wo Contrast  07/17/2013   CLINICAL DATA:  Intermittent left flank pain.  Fever  EXAM: CT ABDOMEN AND PELVIS WITHOUT CONTRAST  TECHNIQUE: Multidetector CT imaging of the abdomen and pelvis was performed following the standard protocol without intravenous contrast.  COMPARISON:  None.  FINDINGS: There is mild atelectasis and calcified nodularity at the right lung base. No acute findings. No pericardial fluid.  There is a tiny pockets of intraperitoneal gas collecting along the ventral peritoneal surface (image 29, series 2) within the left upper quadrant for example. There is a moderate amount of intraperitoneal free fluid along the margin of the liver and spleen. Both these findings are likely related to the peritoneal dialysis. There is a peritoneal dialysis catheter entering the left central abdomen with tip in the left pericolic gutter.  The liver appears normal on this noncontrast exam. The gallbladder is collapsed. The pancreas is atrophic. The spleen is small. The adrenal glands are normal. Kidneys are small.  Stomach, small bowel, and colon  are unremarkable.  Abdominal aorta is normal caliber. There intimal calcifications of the aorta. No retroperitoneal periportal lymphadenopathy.  Small free fluid the pelvis. The bladder prostate gland are normal. No pelvic lymphadenopathy. No aggressive osseous lesion.  IMPRESSION: 1. Intraperitoneal free fluid a small amount of intraperitoneal air are likely related to peritoneal dialysis. 2. No acute findings evident on this noncontrast exam.   Electronically Signed   By: Suzy Bouchard M.D.   On: 07/17/2013 09:13   Dg Chest 2 View  07/16/2013   CLINICAL DATA:  Cough.  Fever.  EXAM: CHEST  2 VIEW  COMPARISON:  DG CHEST 2 VIEW dated 11/30/2012  FINDINGS: Unchanged cardiomegaly. The left IJ dialysis catheter is been removed. Aortic arch atherosclerosis. Median sternotomy wire is present. There is no airspace disease or effusion.  IMPRESSION: Mild cardiomegaly without failure.  No acute abnormality.   Electronically Signed   By: Dereck Ligas M.D.   On: 07/16/2013 23:55    Microbiology: Recent Results (from the past 240 hour(s))  CULTURE, BLOOD (ROUTINE X 2)     Status: None   Collection Time    07/17/13  3:42 AM      Result Value Ref Range Status   Specimen Description BLOOD RIGHT ARM   Final   Special Requests BOTTLES DRAWN AEROBIC AND ANAEROBIC 10CC EACH   Final   Culture  Setup Time     Final   Value: 07/17/2013 08:32     Performed at [facility]   Culture     Final   Value:        BLOOD CULTURE RECEIVED NO GROWTH TO DATE CULTURE WILL BE HELD FOR 5 DAYS BEFORE ISSUING A FINAL NEGATIVE REPORT     Performed at [facility]   Report Status PENDING   Incomplete  CULTURE, BLOOD (ROUTINE X 2)     Status: None   Collection Time    07/17/13  3:50 AM      Result Value Ref Range Status   Specimen Description BLOOD RIGHT HAND   Final   Special Requests BOTTLES DRAWN AEROBIC ONLY 10CC   Final  Culture  Setup Time     Final   Value: 07/17/2013 08:33     Performed at [facility]   Culture     Final   Value:        BLOOD CULTURE RECEIVED NO GROWTH TO DATE CULTURE WILL BE HELD FOR 5 DAYS BEFORE ISSUING A FINAL NEGATIVE REPORT     Performed at [facility]   Report Status PENDING   Incomplete  GRAM STAIN     Status: None   Collection Time    07/17/13  3:30 PM      Result Value Ref Range Status   Specimen Description PERITONEAL CAVITY FLUID   Final   Special Requests NONE   Final   Gram Stain     Final   Value: RARE WBC PRESENT, PREDOMINANTLY MONONUCLEAR     NO ORGANISMS SEEN   Report Status 07/17/2013 FINAL   Final  BODY FLUID CULTURE     Status: None   Collection Time    07/17/13  3:30 PM      Result Value Ref Range Status   Specimen Description PERITONEAL CAVITY FLUID   Final   Special Requests NONE   Final   Gram Stain     Final   Value: RARE WBC PRESENT, PREDOMINANTLY MONONUCLEAR     NO ORGANISMS SEEN     Performed at [facility]     Performed at [facility]   Culture     Final   Value: NO GROWTH     Performed at [facility]   Report Status PENDING   Incomplete     Labs: Basic Metabolic Panel:  Recent Labs Lab 07/16/13 07/17/13 0724 07/18/13 0320  NA 135* 135* 136*  K 3.7 3.2* 3.5*  CL 92* 95* 95*  CO2 26 28 26   GLUCOSE 360* 298* 182*  BUN 28* 28* 32*  CREATININE 2.49* 2.58* 2.78*  CALCIUM 7.9* 7.6* 8.0*   Liver Function Tests:  Recent Labs Lab 07/16/13 07/17/13 0724  AST 25 20  ALT 21 16  ALKPHOS 126* 99  BILITOT 0.3 0.4  PROT 6.6 5.7*  ALBUMIN 2.3* 1.9*   No results found for this basename: LIPASE, AMYLASE,  in the last 168 hours No results found for this basename: AMMONIA,  in the last 168 hours CBC:  Recent Labs Lab 07/16/13 07/17/13 0724 07/18/13 0320  WBC 14.0* 10.4 10.6*  NEUTROABS 11.0* 7.6  --   HGB 12.9* 11.8* 12.1*  HCT 37.8* 35.0* 35.7*  MCV 94.5 95.6 94.9  PLT 300 244 291   Cardiac Enzymes: No results found for this basename: CKTOTAL, CKMB, CKMBINDEX,  TROPONINI,  in the last 168 hours BNP: BNP (last 3 results)  Recent Labs  10/30/12 0030  PROBNP 17301.0*   CBG:  Recent Labs Lab 07/17/13 1224 07/17/13 1649 07/17/13 2238 07/18/13 0733 07/18/13 1135  GLUCAP 169* 126* 159* 211* 197*       Signed:  Christne Platts C  [facility] 07/18/2013, 4:32 PM

## 2013-07-18 NOTE — Progress Notes (Signed)
Patient refuses bed alarm. Educated patient on safety concerns. Will continue to monitor patient closely.

## 2013-07-18 NOTE — Progress Notes (Signed)
  [facility] Progress Note   Subjective: PD fluid cell count was 2, gram stain was negative for any organisms.  Tm 99.5 overnight, no complaints today. No abd pain, n/v/d or cough.   Filed Vitals:   07/17/13 1345 07/17/13 2153 07/18/13 0459 07/18/13 0918  BP: 106/60 131/61 111/66 120/54  Pulse: 62 70 78 80  Temp: 98.2 F (36.8 C) 98.4 F (36.9 C) 99.5 F (37.5 C) 98.2 F (36.8 C)  TempSrc: Oral Oral Oral Oral  Resp: 18 17 18 18   Height:  5\' 10"  (1.778 m)    Weight:  111.1 kg (244 lb 14.9 oz)    SpO2: 97% 96% 92% 97%  Exam Alert, no distress, calm  No jvd  Chest clear bilat  RRR no MRG  Abd obese, soft, nontender, mild erythema at PD cath exit site mild with no drainage Pitting edema of legs is better today Neuro is nf, ox3   UA - negative  CT abd/pelvis (noncontrast) > no acute findings, air in abd from PD  CXR - no acute disease   Assessment:  1 Fever- unclear etiology, not toxic, workup neg so far. PD fluid is benign, doubt peritonitis. Per primary.  2 ESRD on PD  3 HTN/volume- vol excess improving with PD 4 DM on insulin  5 CAD hx CABG  6 Gout   [name] MD pager [phone]    cell [phone] 07/18/2013, 12:51 PM   Recent Labs Lab 07/16/13 07/17/13 0724 07/18/13 0320  NA 135* 135* 136*  K 3.7 3.2* 3.5*  CL 92* 95* 95*  CO2 26 28 26   GLUCOSE 360* 298* 182*  BUN 28* 28* 32*  CREATININE 2.49* 2.58* 2.78*  CALCIUM 7.9* 7.6* 8.0*    Recent Labs Lab 07/16/13 07/17/13 0724  AST 25 20  ALT 21 16  ALKPHOS 126* 99  BILITOT 0.3 0.4  PROT 6.6 5.7*  ALBUMIN 2.3* 1.9*    Recent Labs Lab 07/16/13 07/17/13 0724 07/18/13 0320  WBC 14.0* 10.4 10.6*  NEUTROABS 11.0* 7.6  --   HGB 12.9* 11.8* 12.1*  HCT 37.8* 35.0* 35.7*  MCV 94.5 95.6 94.9  PLT 300 244 291   . allopurinol  100 mg Oral BID  . aspirin  325 mg Oral Daily  . calcium-vitamin D  1 tablet Oral Q breakfast  . ceFEPime (MAXIPIME) IV  1 g Intravenous Q24H  . enoxaparin  (LOVENOX) injection  30 mg Subcutaneous Q24H  . furosemide  40 mg Oral BID  . insulin aspart  0-9 Units Subcutaneous TID WC  . insulin glargine  30 Units Subcutaneous q morning - 10a  . levothyroxine  112 mcg Oral QAC breakfast  . loratadine  10 mg Oral Daily  . metoprolol tartrate  25 mg Oral BID  . multivitamin  1 tablet Oral QHS  . potassium chloride SA  20 mEq Oral Daily  . predniSONE  1 mg Oral BID  . sodium chloride  3 mL Intravenous Q12H  . sodium chloride  3 mL Intravenous Q12H   . dialysis solution 1.5% low-MG/low-CA    . dialysis solution 2.5% low-MG/low-CA    . heparin     acetaminophen, acetaminophen, guaiFENesin, morphine injection, ondansetron (ZOFRAN) IV, ondansetron

## 2013-07-18 NOTE — Progress Notes (Signed)
Patient discharged to home. Patient AVS reviewed with patient and patient's spouse. Patient and spouse verbalized understanding of medications and follow-up appointments.  Patient remains stable; no signs or symptoms of distress.  Patient educated to return to the ER in cases of SOB, dizziness, fever, chest pain, or fainting.  

## 2013-07-21 LAB — BODY FLUID CULTURE: Culture: NO GROWTH

## 2013-07-23 LAB — CULTURE, BLOOD (ROUTINE X 2)
CULTURE: NO GROWTH
Culture: NO GROWTH

## 2013-07-26 ENCOUNTER — Other Ambulatory Visit: Payer: Self-pay | Admitting: *Deleted

## 2013-07-26 ENCOUNTER — Encounter: Payer: Self-pay | Admitting: Nurse Practitioner

## 2013-07-26 ENCOUNTER — Ambulatory Visit (INDEPENDENT_AMBULATORY_CARE_PROVIDER_SITE_OTHER): Payer: Medicare Other | Admitting: Nurse Practitioner

## 2013-07-26 ENCOUNTER — Ambulatory Visit: Payer: Medicare Other | Admitting: Nurse Practitioner

## 2013-07-26 VITALS — BP 122/70 | HR 64 | Temp 97.8°F | Resp 10 | Wt 240.0 lb

## 2013-07-26 DIAGNOSIS — N186 End stage renal disease: Secondary | ICD-10-CM

## 2013-07-26 DIAGNOSIS — Z992 Dependence on renal dialysis: Secondary | ICD-10-CM

## 2013-07-26 DIAGNOSIS — M109 Gout, unspecified: Secondary | ICD-10-CM

## 2013-07-26 DIAGNOSIS — E1129 Type 2 diabetes mellitus with other diabetic kidney complication: Secondary | ICD-10-CM

## 2013-07-26 DIAGNOSIS — E1165 Type 2 diabetes mellitus with hyperglycemia: Secondary | ICD-10-CM

## 2013-07-26 DIAGNOSIS — I2510 Atherosclerotic heart disease of native coronary artery without angina pectoris: Secondary | ICD-10-CM

## 2013-07-26 DIAGNOSIS — R509 Fever, unspecified: Secondary | ICD-10-CM

## 2013-07-26 MED ORDER — INSULIN PEN NEEDLE 30G X 8 MM MISC: Status: DC

## 2013-07-26 MED ORDER — INSULIN PEN NEEDLE 30G X 8 MM MISC: 1.0000 | Freq: Every day | Status: DC

## 2013-07-26 MED ORDER — INSULIN GLARGINE 100 UNIT/ML SOLOSTAR PEN: PEN_INJECTOR | SUBCUTANEOUS | Status: DC

## 2013-07-26 MED ORDER — INSULIN GLARGINE 100 UNITS/ML SOLOSTAR PEN: 45.0000 [IU] | PEN_INJECTOR | Freq: Every day | SUBCUTANEOUS | Status: DC

## 2013-07-26 NOTE — Patient Instructions (Signed)
Keep blood sugar log- bring this to all your visit Increase lantus by 1 unit every morning until you get to 45 units  In 1 month follow up with Oretha Ellis pharm D  In 3 months follow up with Mariea Clonts DO or Janett Billow NP

## 2013-07-26 NOTE — Progress Notes (Signed)
Patient ID: PAMELA MADDY, male   DOB: 04/15/1941, 73 y.o.   MRN: [id number]    Allergies  Allergen Reactions  . Flu Virus Vaccine     Gets the flu, patient has bad reaction to the flu vaccine.  . Lipitor [Atorvastatin]     Leg cramps    Chief Complaint  Patient presents with  . Follow-up    ED follow-up for Fever  . Medication Refill    Request for Lantus pen vs vial     HPI:  73 y.o. male pt of Dr Mariea Clonts seen in the office today for hospital follow up; history of ESRD on peritoneal dialysis, diabetes mellitus, CAD, gout, hypertension went to the ER and was admitted to the hospital due to fever- source was not clear. chest x-ray which showed no acute infiltrates, urinalysis was negative for infection, influenza panel negative and CT scan of abdomen and pelvis showed intraperitoneal free fluid small amount of air likely related to peritoneal dialysis otherwise no acute findings. Blood cultures were obtained and Patient was started on empiric antibiotics with Cefepime vancomycin. Peritoneal fluid was also sampled and to date culture shows no growth and no organisms seen on Gram stain only rare WBC is present. Pt remained afebrile and was hemodynamically stable and was discharged home on 07/18/13; is here today for hospital follow up.  Since pt has been at home he has remained afebrile, and doing good. Good appetite and good energy since he has been home he is now back baseline.  Blood sugars have been high- raised lantus to 40 units in the hospital-- has been on this for 1 week no episodes of hypoglycemia; has been taking blood sugars every morning still ranging from 195s-233-one of 430 (noncomplant with diet) Review of Systems:  Review of Systems  Constitutional: Negative for fever, chills and malaise/fatigue.  Eyes: Negative for redness.  Respiratory: Negative for cough and shortness of breath.   Cardiovascular: Negative for chest pain and leg swelling.  Gastrointestinal: Negative for  heartburn, abdominal pain, diarrhea and constipation.  Genitourinary: Negative for dysuria and flank pain.  Musculoskeletal: Positive for back pain and falls. Negative for myalgias.  Neurological: Negative for dizziness, loss of consciousness, weakness and headaches.  Endo/Heme/Allergies: Bruises/bleeds easily.  Psychiatric/Behavioral: Positive for memory loss.     Past Medical History  Diagnosis Date  . Hypertension   . Diabetes mellitus   . Hypothyroidism   . CHF (congestive heart failure)   . Anemia   . Blind left eye   . Sleep apnea     not on CPAP  . Coronary artery disease     CABG x 11 Jun 2009.  MRSA infections of incsions  . Arthritis   . Sarcoidosis     Hx:of  . Diabetic retinopathy     Hx: of bilateral  . Edema   . Hyperlipidemia   . 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 Atherosclerosis of native arteries of the extremities, unspecified   . Eczema     Hx: of  . Insomnia   . Seasonal allergies   . ESRD on dialysis     Was on dialysis in July 2011 and then stopped and restarted May 2014. He then transitioned to PD in Aug 2014. He does PD on cycler when at home and CAPD 6 exchanges per day when travelling. His wife is "in charge" and does the exchanges.   . History of blood transfusion     with  heart surgery  . Coronary atherosclerosis of native coronary artery   . Atherosclerosis of native arteries of the extremities, unspecified    Past Surgical History  Procedure Laterality Date  . Cardiac surgery      total of 6 surgeries, 5 related to mrsa  . Coronary artery bypass graft  06/2009  . Cataract surgery      Hx: of  . Debridements      Hx: of secondary to MRSA  . Av fistula placement Right 10/11/2012    Procedure: ARTERIOVENOUS (AV) FISTULA CREATION;  Surgeon: Rosetta Posner, MD;  Location: [facility];  Service: Vascular;  Laterality: Right;  . Insertion of dialysis catheter Left   . Capd insertion N/A 11/28/2012    Procedure: LAPAROSCOPIC  PERITONEAL DIALYSIS CATHETER PLACEMENT;  Surgeon: Ralene Ok, MD;  Location: [facility];  Service: General;  Laterality: N/A;   Social History:   reports that he has never smoked. He has never used smokeless tobacco. He reports that he does not drink alcohol or use illicit drugs.  Family History  Problem Relation Age of Onset  . COPD Mother   . COPD Father     Medications: Patient's Medications  New Prescriptions   No medications on file  Previous Medications   ASPIRIN 325 MG TABLET    Take 325 mg by mouth daily.   B COMPLEX-VITAMIN C-FOLIC ACID (NEPHRO-VITE) 0.8 MG TABS    Take 1 tablet by mouth at bedtime.   CALCIUM CARBONATE (TUMS - DOSED IN MG ELEMENTAL CALCIUM) 500 MG CHEWABLE TABLET    Chew 1 tablet by mouth 3 (three) times daily with meals.   CALCIUM CARBONATE-VITAMIN D (CALCIUM + D PO)    Take 1 tablet by mouth daily.    FEBUXOSTAT (ULORIC) 40 MG TABLET    Take 40 mg by mouth daily.   FUROSEMIDE (LASIX) 40 MG TABLET    Take 40 mg by mouth 2 (two) times daily.   GENTAMICIN CREAM (GARAMYCIN) 0.1 %    Apply 1 application topically daily.   GUAIFENESIN (MUCINEX) 600 MG 12 HR TABLET    Take 600 mg by mouth 2 (two) times daily.   INSULIN ASPART (NOVOLOG) 100 UNIT/ML INJECTION    Inject 2-8 Units into the skin 3 (three) times daily with meals. Take none if blood sugar < 150, 151-200 2 units, 201-250 4 units, 251-300 6 units, 301-350 8 units   LACTULOSE (CHRONULAC) 10 GM/15ML SOLUTION    Take 10 g by mouth every 4 (four) hours as needed for mild constipation.   LEVOTHYROXINE (SYNTHROID, LEVOTHROID) 112 MCG TABLET    Take 112 mcg by mouth daily.   METOPROLOL TARTRATE (LOPRESSOR) 25 MG TABLET    Take 1 tablet (25 mg total) by mouth 2 (two) times daily.   PERITONEAL DIALYSIS SOLUTIONS IP    Inject 2,700 Units into the peritoneum 6 (six) times daily.   POTASSIUM CHLORIDE SA (K-DUR,KLOR-CON) 20 MEQ TABLET    Take 20 mEq by mouth daily.   PREDNISONE (DELTASONE) 1 MG TABLET    Take 1 mg by  mouth 2 (two) times daily.   PSEUDOEPHEDRINE-DM-GG (TUSSIN COLD/COUGH PO)    Take 5 mLs by mouth every 4 (four) hours as needed (cough).  Modified Medications   Modified Medication Previous Medication   INSULIN GLARGINE (LANTUS) 100 UNIT/ML INJECTION insulin glargine (LANTUS) 100 UNIT/ML injection      Inject 40 Units into the skin every morning.    Inject 0.3 mLs (30 Units total)  into the skin every morning.  Discontinued Medications   No medications on file     Physical Exam:  Filed Vitals:   07/26/13 1024  BP: 122/70  Pulse: 64  Temp: 97.8 F (36.6 C)  TempSrc: Oral  Resp: 10  Weight: 240 lb (108.863 kg)  SpO2: 95%    Physical Exam  Constitutional: He is oriented to person, place, and time and well-developed, well-nourished, and in no distress.  Neck: Normal range of motion. Neck supple. No thyromegaly present.  Cardiovascular: Normal rate, regular rhythm and normal heart sounds.   Pulmonary/Chest: Effort normal and breath sounds normal. No respiratory distress.  Abdominal: Soft. Bowel sounds are normal.  Musculoskeletal: He exhibits edema (trace). He exhibits no tenderness.  Neurological: He is alert and oriented to person, place, and time.  Skin: Skin is warm and dry. No erythema.  Psychiatric: Affect normal.    Labs reviewed: Basic Metabolic Panel:  Recent Labs  10/04/12 1732  10/05/12 0340  10/30/12 0800  11/04/12 1159 11/08/12 0559  11/30/12 0915  07/16/13 07/17/13 0724 07/18/13 0320  NA 135  --  136  < >  --   < > 131* 133*  < > 135  < > 135* 135* 136*  K 3.7  --  4.3  < >  --   < > 3.9 4.1  < > 4.0  < > 3.7 3.2* 3.5*  CL 96  --  97  < >  --   < > 87* 94*  --  96  < > 92* 95* 95*  CO2 24  --  27  < >  --   < > 29 26  --  24  < > 26 28 26   GLUCOSE 114*  --  165*  < >  --   < > 279* 207*  < > 202*  < > 360* 298* 182*  BUN 75*  --  76*  < >  --   < > 75* 42*  --  49*  < > 28* 28* 32*  CREATININE 4.59*  < > 4.60*  < > 4.53*  < > 3.95* 3.96*  --  3.35*  < >  2.49* 2.58* 2.78*  CALCIUM 9.1  --  8.4  < >  --   < > 10.1 9.7  --  9.2  < > 7.9* 7.6* 8.0*  MG  --   --  2.2  --   --   --   --   --   --  2.0  --   --   --   --   PHOS  --   --  5.1*  < >  --   < > 3.1 3.8  --  2.3  --   --   --   --   TSH  --   --  0.875  --  1.310  --   --   --   --   --   --   --   --   --   < > = values in this interval not displayed. Liver Function Tests:  Recent Labs  12/01/12 0525 07/16/13 07/17/13 0724  AST 16 25 20   ALT 5 21 16   ALKPHOS 80 126* 99  BILITOT 0.2* 0.3 0.4  PROT 7.0 6.6 5.7*  ALBUMIN 2.4* 2.3* 1.9*   No results found for this basename: LIPASE, AMYLASE,  in the last 8760 hours No results found for this basename: AMMONIA,  in the last 8760 hours CBC:  Recent Labs  11/30/12 0915  07/16/13 07/17/13 0724 07/18/13 0320  WBC 14.4*  < > 14.0* 10.4 10.6*  NEUTROABS 11.4*  --  11.0* 7.6  --   HGB 10.9*  < > 12.9* 11.8* 12.1*  HCT 32.8*  < > 37.8* 35.0* 35.7*  MCV 94.5  < > 94.5 95.6 94.9  PLT 289  < > 300 244 291  < > = values in this interval not displayed. Lipid Panel: No results found for this basename: CHOL, HDL, LDLCALC, TRIG, CHOLHDL, LDLDIRECT,  in the last 8760 hours TSH:  Recent Labs  10/05/12 0340 10/30/12 0800  TSH 0.875 1.310   A1C: Lab Results  Component Value Date   HGBA1C 12.6* 07/17/2013    Assessment/Plan  1. Type II or unspecified type diabetes mellitus with renal manifestations, uncontrolled -still with elevation in blood sugar, will have pt increase lantus by 1 unit daily until he gets to 45 units  -to cont blood sugar log and bring to next appt -conts novolog with meals -encouraged diet compliance -on prednisone and peritoneal dialysis which has elevated Blood sugars  2. CAD (coronary artery disease) -s/p CABG; stable; without chest pain, conts on ASA 325  3. ESRD on dialysis -peritoneal dialysis; has appt today, will get follow up blood work at this appt  4. Fever -resolved   5. Gout -had  recent episode, now this is stable on low does prednisone and uloric   7. CHF -stable on lasix and potassium, without increase in shortness of breath   To follow up with Tye [place] in 1 month and with [facility] in 3 months

## 2013-07-27 ENCOUNTER — Other Ambulatory Visit: Payer: Self-pay | Admitting: *Deleted

## 2013-07-27 MED ORDER — GLUCOSE BLOOD VI STRP: ORAL_STRIP | Status: DC

## 2013-08-27 ENCOUNTER — Ambulatory Visit: Payer: Medicare Other | Admitting: Pharmacotherapy

## 2013-10-23 ENCOUNTER — Other Ambulatory Visit: Payer: Self-pay | Admitting: *Deleted

## 2013-10-23 MED ORDER — LEVOTHYROXINE SODIUM 112 MCG PO TABS: ORAL_TABLET | ORAL | Status: DC

## 2013-10-24 ENCOUNTER — Ambulatory Visit: Payer: Medicare Other | Admitting: Nurse Practitioner

## 2013-12-08 ENCOUNTER — Other Ambulatory Visit: Payer: Self-pay | Admitting: Internal Medicine

## 2014-01-04 ENCOUNTER — Telehealth: Payer: Self-pay | Admitting: Internal Medicine

## 2014-01-04 NOTE — Telephone Encounter (Signed)
20 pages of records received from [facility] at [facility] to [facility] in [place] for the patient's appointment on 8.4.15 with Dr. Hilty:djc

## 2014-01-08 ENCOUNTER — Encounter: Payer: Self-pay | Admitting: Internal Medicine

## 2014-01-08 ENCOUNTER — Ambulatory Visit (INDEPENDENT_AMBULATORY_CARE_PROVIDER_SITE_OTHER): Payer: Medicare Other | Admitting: Internal Medicine

## 2014-01-08 VITALS — BP 77/54 | HR 78 | Ht 70.0 in | Wt 243.0 lb

## 2014-01-08 DIAGNOSIS — Z951 Presence of aortocoronary bypass graft: Secondary | ICD-10-CM | POA: Insufficient documentation

## 2014-01-08 DIAGNOSIS — R0989 Other specified symptoms and signs involving the circulatory and respiratory systems: Secondary | ICD-10-CM

## 2014-01-08 DIAGNOSIS — G4730 Sleep apnea, unspecified: Secondary | ICD-10-CM

## 2014-01-08 DIAGNOSIS — IMO0002 Reserved for concepts with insufficient information to code with codable children: Secondary | ICD-10-CM

## 2014-01-08 DIAGNOSIS — R0600 Dyspnea, unspecified: Secondary | ICD-10-CM

## 2014-01-08 DIAGNOSIS — R0609 Other forms of dyspnea: Secondary | ICD-10-CM

## 2014-01-08 DIAGNOSIS — I2119 ST elevation (STEMI) myocardial infarction involving other coronary artery of inferior wall: Secondary | ICD-10-CM

## 2014-01-08 DIAGNOSIS — Z992 Dependence on renal dialysis: Secondary | ICD-10-CM

## 2014-01-08 DIAGNOSIS — I509 Heart failure, unspecified: Secondary | ICD-10-CM

## 2014-01-08 DIAGNOSIS — I2510 Atherosclerotic heart disease of native coronary artery without angina pectoris: Secondary | ICD-10-CM

## 2014-01-08 DIAGNOSIS — N186 End stage renal disease: Secondary | ICD-10-CM

## 2014-01-08 DIAGNOSIS — I951 Orthostatic hypotension: Secondary | ICD-10-CM

## 2014-01-08 DIAGNOSIS — I2584 Coronary atherosclerosis due to calcified coronary lesion: Secondary | ICD-10-CM

## 2014-01-08 NOTE — Progress Notes (Signed)
OFFICE NOTE  Chief Complaint:  Progressive DOE  Primary Care Physician: [name], MD  HPI:  Jack Chung is a 73 year old male who was referred to me for to establish cardiac care. 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 In 2011 apparently he was working on putting a roof on his house and came in later in the day. His wife found him apparently sitting by the side of his bed and he was blue, confused and short of breath. She called 911 and was taken to the emergency room. Ultimately his EKG demonstrated probably acute MI and he underwent cardiac catheterization which showed multivessel coronary artery disease. At that time there were no clear areas that would be amenable to percutaneous intervention. A surgical consult was obtained and per his report 3 different surgeons would not operate on him but eventually Dr. Roxanna Mew did.  According to his operative report there was significant multivessel coronary disease. The LAD was heavily calcified throughout its length and closed 95% proximally. The mid and distal portion of the vessel had a lumen diameter 1.25 mm and were heavily calcified. The circumflex obtuse marginal branch was patent. The posterior lateral branch was a 2 mm vessel. The right coronary artery was totally occluded. The posterior descending artery was also a very small vessel about 1.5-2 mm. He underwent a three-vessel bypass and did fairly well although had a difficult recovery. 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  He was recently hospitalized for fever of unknown origin. He underwent an echocardiogram which showed an EF of 50-55% with grade 2 diastolic dysfunction. Since that time he said progressive shortness of breath but denies any chest discomfort. He said he never had  any chest pain even prior to his episode where he presented for bypass surgery. Angina may not be a reliable marker this gentleman.  PMHx:  Past Medical History  Diagnosis Date  . Hypertension   . Diabetes mellitus   . Hypothyroidism   . CHF (congestive heart failure)   . Anemia   . Blind left eye   . Sleep apnea     not on CPAP  . Coronary artery disease     CABG x 11 Jun 2009.  MRSA infections of incsions  . Arthritis   . Sarcoidosis     Hx:of  . Diabetic retinopathy     Hx: of bilateral  . Edema   . Hyperlipidemia   . 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 Atherosclerosis of native arteries of the extremities, unspecified   . Eczema     Hx: of  . Insomnia   . Seasonal allergies   . ESRD on dialysis     Was on dialysis in July 2011 and then stopped and restarted May 2014. He then transitioned to PD in Aug 2014. He does PD on cycler when at home and CAPD 6 exchanges per day when travelling. His wife is "in charge" and does the exchanges.   . History of blood transfusion     with heart surgery  . Coronary atherosclerosis of native coronary artery   . Atherosclerosis of native arteries of the extremities, unspecified     Past Surgical History  Procedure Laterality Date  . Cardiac surgery      total of 6 surgeries, 5 related to mrsa  .  Coronary artery bypass graft  06/2009  . Cataract surgery      Hx: of  . Debridements      Hx: of secondary to MRSA  . Av fistula placement Right 10/11/2012    Procedure: ARTERIOVENOUS (AV) FISTULA CREATION;  Surgeon: Rosetta Posner, MD;  Location: [facility];  Service: Vascular;  Laterality: Right;  . Insertion of dialysis catheter Left   . Capd insertion N/A 11/28/2012    Procedure: LAPAROSCOPIC PERITONEAL DIALYSIS CATHETER PLACEMENT;  Surgeon: Ralene Ok, MD;  Location: [facility];  Service: General;  Laterality: N/A;    FAMHx:  Family History  Problem Relation Age of Onset  . COPD Mother   . COPD Father      SOCHx:   reports that he has never smoked. He has never used smokeless tobacco. He reports that he does not drink alcohol or use illicit drugs.  ALLERGIES:  Allergies  Allergen Reactions  . Flu Virus Vaccine     Gets the flu, patient has bad reaction to the flu vaccine.  . Lipitor [Atorvastatin]     Leg cramps    ROS: A comprehensive review of systems was negative except for: Constitutional: positive for fatigue Respiratory: positive for dyspnea on exertion  HOME MEDS: Current Outpatient Prescriptions  Medication Sig Dispense Refill  . aspirin 325 MG tablet Take 325 mg by mouth daily.      Marland Kitchen b complex-vitamin c-folic acid (NEPHRO-VITE) 0.8 MG TABS Take 1 tablet by mouth at bedtime.  90 tablet  3  . calcium carbonate (TUMS - DOSED IN MG ELEMENTAL CALCIUM) 500 MG chewable tablet Chew 1 tablet by mouth 3 (three) times daily with meals.      . Calcium Carbonate-Vitamin D (CALCIUM + D PO) Take 1 tablet by mouth daily.       . furosemide (LASIX) 40 MG tablet Take 40 mg by mouth 2 (two) times daily.      Marland Kitchen gentamicin cream (GARAMYCIN) 0.1 % Apply 1 application topically daily.      Marland Kitchen glucose blood (ACCU-CHEK AVIVA PLUS) test strip Use one strip to check glucose once daily Dx 250.00  100 each  12  . insulin aspart (NOVOLOG) 100 UNIT/ML injection Inject 10 Units into the skin 3 (three) times daily with meals. Take none if blood sugar < 150, 151-200 2 units, 201-250 4 units, 251-300 6 units, 301-350 8 units      . Insulin Glargine (LANTUS) 100 UNIT/ML Solostar Pen Inject 45 units into the skin daily. Dx 250.00  15 mL  6  . Insulin Pen Needle (NOVOFINE) 30G X 8 MM MISC Use with Insulin Pen Dx 250.00  100 each  6  . lactulose (CHRONULAC) 10 GM/15ML solution Take 10 g by mouth every 4 (four) hours as needed for mild constipation.      Marland Kitchen levothyroxine (SYNTHROID, LEVOTHROID) 112 MCG tablet LABS/APPOINTMENT OVERDUE, 1 by mouth daily in the morning 30 minutes before breakfast for Thyroid  15  tablet  0  . metoprolol (LOPRESSOR) 100 MG tablet TAKE ONE TABLET BY MOUTH EVERY 12 HOURS FOR BLOOD PRESSURE  180 tablet  0  . PERITONEAL DIALYSIS SOLUTIONS IP Inject 2,500 Units into the peritoneum 4 (four) times daily.       . potassium chloride SA (K-DUR,KLOR-CON) 20 MEQ tablet Take 20 mEq by mouth daily.      . predniSONE (DELTASONE) 1 MG tablet Take 1 mg by mouth 2 (two) times daily.      . rosuvastatin (  CRESTOR) 20 MG tablet Take 20 mg by mouth daily.       No current facility-administered medications for this visit.    LABS/IMAGING: No results found for this or any previous visit (from the past 48 hour(s)). No results found.  VITALS: BP 77/54  Pulse 78  Ht 5\' 10"  (1.778 m)  Wt 243 lb (110.224 kg)  BMI 34.87 kg/m2  EXAM: General appearance: alert and no distress Neck: no carotid bruit and no JVD Lungs: clear to auscultation bilaterally Heart: regular rate and rhythm, S1, S2 normal, no murmur, click, rub or gallop Abdomen: soft, non-tender; bowel sounds normal; no masses,  no organomegaly Extremities: edema 1+ Pulses: 2+ and symmetric Skin: Skin color, texture, turgor normal. No rashes or lesions Neurologic: Mental status: Alert, oriented, thought content appropriate  EKG: Sinus rhythm with first degree A-V block, PVCs, right bundle branch block, inferior infarct, old at 61  ASSESSMENT: 1. Multivessel coronary artery disease status post 3 vessel CABG in 2011 2. Insulin-dependent diabetes 3. Sarcoidosis on prednisone 4. Hypertension 5. Dyslipidemia 6. End-stage renal disease on peritoneal dialysis 7. Obesity 8. Recent fever of unknown origin 9. Symptomatic hypotension  PLAN: 1.   Jack Chung is reported progressive shortness of breath but no chest discomfort. He is never had anginal symptoms even before his bypass. His EF is 50-55% several months ago but has had progressive shortness of breath since then. He also recently had a fever of unknown origin and was  hospitalized for this. Workup did not show any peritonitis or infection. He continues to have problems with labile blood pressures and significant hypotension which has required him to decrease the dose of his metoprolol. Currently is taking 25 mg twice daily however his blood pressure remains low and was only in the 16X systolic today. I reviewed his home dialysis records and indicates that his blood pressures have been very low there is well. I've recommended decreasing his metoprolol to 12.5 mg twice daily. I also would recommend a stress test and repeat echocardiogram to make sure there's been no new cardiomyopathy or there is no worsening ischemia. Based on the poor targets and the fact that he was not a good surgical candidate, the likelihood of him having long-standing benefit from his bypass is fairly low.  Plan to see him back to discuss results of these studies. Thank you again for the kind referral.  Pixie Casino, MD, [facility] Attending Cardiologist [facility]  Pavlos Yon C 01/08/2014, 4:48 PM

## 2014-01-08 NOTE — Patient Instructions (Signed)
Your physician has requested that you have an echocardiogram. Echocardiography is a painless test that uses sound waves to create images of your heart. It provides your doctor with information about the size and shape of your heart and how well your heart's chambers and valves are working. This procedure takes approximately one hour. There are no restrictions for this procedure.  Your physician has requested that you have a lexiscan myoview. For further information please visit [email or web address]. Please follow instruction sheet, as given.  Your physician recommends that you schedule a follow-up appointment after your tests, in about 1 month.

## 2014-01-18 ENCOUNTER — Telehealth (HOSPITAL_COMMUNITY): Payer: Self-pay

## 2014-01-18 NOTE — Telephone Encounter (Signed)
Encounter complete. 

## 2014-01-22 ENCOUNTER — Telehealth (HOSPITAL_COMMUNITY): Payer: Self-pay

## 2014-01-22 NOTE — Telephone Encounter (Signed)
Encounter complete. 

## 2014-01-23 ENCOUNTER — Ambulatory Visit (HOSPITAL_COMMUNITY)
Admission: RE | Admit: 2014-01-23 | Discharge: 2014-01-23 | Disposition: A | Discharge status: Home / Self Care | Payer: Medicare Other | Source: Ambulatory Visit | Attending: Cardiology | Admitting: Cardiology

## 2014-01-23 DIAGNOSIS — R0989 Other specified symptoms and signs involving the circulatory and respiratory systems: Secondary | ICD-10-CM | POA: Insufficient documentation

## 2014-01-23 DIAGNOSIS — I519 Heart disease, unspecified: Secondary | ICD-10-CM | POA: Insufficient documentation

## 2014-01-23 DIAGNOSIS — Z6834 Body mass index (BMI) 34.0-34.9, adult: Secondary | ICD-10-CM | POA: Insufficient documentation

## 2014-01-23 DIAGNOSIS — I739 Peripheral vascular disease, unspecified: Secondary | ICD-10-CM | POA: Insufficient documentation

## 2014-01-23 DIAGNOSIS — I2119 ST elevation (STEMI) myocardial infarction involving other coronary artery of inferior wall: Secondary | ICD-10-CM

## 2014-01-23 DIAGNOSIS — Z951 Presence of aortocoronary bypass graft: Secondary | ICD-10-CM

## 2014-01-23 DIAGNOSIS — R0609 Other forms of dyspnea: Secondary | ICD-10-CM | POA: Diagnosis not present

## 2014-01-23 DIAGNOSIS — I252 Old myocardial infarction: Secondary | ICD-10-CM | POA: Insufficient documentation

## 2014-01-23 DIAGNOSIS — N186 End stage renal disease: Secondary | ICD-10-CM | POA: Insufficient documentation

## 2014-01-23 DIAGNOSIS — I2510 Atherosclerotic heart disease of native coronary artery without angina pectoris: Secondary | ICD-10-CM | POA: Insufficient documentation

## 2014-01-23 DIAGNOSIS — R0602 Shortness of breath: Secondary | ICD-10-CM

## 2014-01-23 DIAGNOSIS — E669 Obesity, unspecified: Secondary | ICD-10-CM | POA: Insufficient documentation

## 2014-01-23 DIAGNOSIS — E119 Type 2 diabetes mellitus without complications: Secondary | ICD-10-CM | POA: Insufficient documentation

## 2014-01-23 DIAGNOSIS — I259 Chronic ischemic heart disease, unspecified: Secondary | ICD-10-CM | POA: Insufficient documentation

## 2014-01-23 DIAGNOSIS — I4510 Unspecified right bundle-branch block: Secondary | ICD-10-CM | POA: Insufficient documentation

## 2014-01-23 DIAGNOSIS — R0600 Dyspnea, unspecified: Secondary | ICD-10-CM

## 2014-01-23 DIAGNOSIS — I509 Heart failure, unspecified: Secondary | ICD-10-CM | POA: Insufficient documentation

## 2014-01-23 DIAGNOSIS — I120 Hypertensive chronic kidney disease with stage 5 chronic kidney disease or end stage renal disease: Secondary | ICD-10-CM | POA: Insufficient documentation

## 2014-01-23 DIAGNOSIS — Z794 Long term (current) use of insulin: Secondary | ICD-10-CM | POA: Insufficient documentation

## 2014-01-23 DIAGNOSIS — I517 Cardiomegaly: Secondary | ICD-10-CM

## 2014-01-23 MED ORDER — TECHNETIUM TC 99M SESTAMIBI GENERIC - CARDIOLITE
30.9000 | Freq: Once | INTRAVENOUS | Status: AC | PRN
  Administered 2014-01-23: 30.9 via INTRAVENOUS

## 2014-01-23 MED ORDER — REGADENOSON 0.4 MG/5ML IV SOLN
0.4000 mg | Freq: Once | INTRAVENOUS | Status: AC
Start: 2014-01-23 — End: 2014-01-23
  Administered 2014-01-23: 0.4 mg via INTRAVENOUS

## 2014-01-23 MED ORDER — TECHNETIUM TC 99M SESTAMIBI GENERIC - CARDIOLITE
10.6000 | Freq: Once | INTRAVENOUS | Status: AC | PRN
  Administered 2014-01-23: 11 via INTRAVENOUS

## 2014-01-23 NOTE — Progress Notes (Signed)
2D Echo Performed 01/23/2014    Marygrace Drought, RCS

## 2014-01-23 NOTE — Procedures (Addendum)
[facility] [address] [place] [address] [phone]  Cardiology Nuclear Med Study  Jack Chung is a 73 y.o. male     MRN : [id number]     DOB: 1941-04-02  Procedure Date: 01/23/2014  Nuclear Med Background Indication for Stress Test:  Evaluation for Ischemia, Graft Patency and Abnormal EKG History:  CAD;MI;CABG X3-06/2009;CHF;End stage renal disease;No prior NUC MPI for comparison;ECHO on 10/31/2012-LVEF=50-55% Cardiac Risk Factors: Hypertension, IDDM Type 2, Lipids, Obesity, PVD and RBBB  Symptoms:  DOE, Fatigue and SOB   Nuclear Pre-Procedure Caffeine/Decaff Intake:  7:00pm NPO After: 5:00am   IV Site: L Hand  IV 0.9% NS with Angio Cath:  22g  Chest Size (in):  50"  IV Started by: Rolene Course, RN  Height: 5\' 10"  (1.778 m)  Cup Size: n/a  BMI:  Body mass index is 34.87 kg/(m^2). Weight:  243 lb (110.224 kg)   Tech Comments:  n/a    Nuclear Med Study 1 or 2 day study: 1 day  Stress Test Type:  [place] Provider:  Lyman Bishop, MD   Resting Radionuclide: Technetium 63m Sestamibi  Resting Radionuclide Dose: 10.6 mCi   Stress Radionuclide:  Technetium 28m Sestamibi  Stress Radionuclide Dose: 30.9 mCi           Stress Protocol Rest HR: 74 Stress HR:88  Rest BP:157/73 Stress BP: 157/73  Exercise Time (min): n/a METS: n/a          Dose of Adenosine (mg):  n/a Dose of Lexiscan: 0.4 mg  Dose of Atropine (mg): n/a Dose of Dobutamine: n/a mcg/kg/min (at max HR)  Stress Test Technologist: Mellody Memos, CCT Nuclear Technologist: Imagene Riches, CNMT   Rest Procedure:  Myocardial perfusion imaging was performed at rest 45 minutes following the intravenous administration of Technetium 81m Sestamibi. Stress Procedure:  The patient received IV Lexiscan 0.4 mg over 15-seconds.  Technetium 59m Sestamibi injected at 30-seconds.  There were no significant changes with Lexiscan.  Quantitative spect  images were obtained after a 45 minute delay.  Transient Ischemic Dilatation (Normal <1.22):  1.11  QGS EDV:  118 ml QGS ESV:  44 ml LV Ejection Fraction: 63%  Rest ECG: NSR-RBBB  Stress ECG: No significant change from baseline ECG  QPS Raw Data Images:  Normal; no motion artifact; normal heart/lung ratio. Stress Images:  There is decreased uptake in the lateral wall. Rest Images:  There is decreased uptake in the apex. Subtraction (SDS):  Reversible apical lateral ischemia, SDS 4  Impression Exercise Capacity:  Lexiscan with no exercise. BP Response:  Normal blood pressure response. Clinical Symptoms:  No significant symptoms noted. ECG Impression:  No significant ECG changes with Lexiscan. Comparison with Prior Nuclear Study: No previous nuclear study performed  Overall Impression:  Intermediate risk stress nuclear study with reversible apical and apical to mid lateral wall ischemia.  LV Wall Motion:  Inferolateral hypokinesis; EF 63%  Pixie Casino, MD, [facility] Board Certified in Nuclear Cardiology Attending Cardiologist [place], MD  01/23/2014 5:14 PM

## 2014-01-28 NOTE — Progress Notes (Signed)
LMTCB for test results (Stress Test)

## 2014-01-28 NOTE — Progress Notes (Signed)
LMTCB

## 2014-01-30 ENCOUNTER — Telehealth: Payer: Self-pay | Admitting: *Deleted

## 2014-01-30 ENCOUNTER — Other Ambulatory Visit: Payer: Self-pay | Admitting: *Deleted

## 2014-01-30 ENCOUNTER — Ambulatory Visit
Admission: RE | Admit: 2014-01-30 | Discharge: 2014-01-30 | Disposition: A | Discharge status: Home / Self Care | Payer: Medicare Other | Source: Ambulatory Visit | Attending: Internal Medicine | Admitting: Internal Medicine

## 2014-01-30 DIAGNOSIS — R5383 Other fatigue: Secondary | ICD-10-CM

## 2014-01-30 DIAGNOSIS — R5381 Other malaise: Secondary | ICD-10-CM

## 2014-01-30 DIAGNOSIS — Z01818 Encounter for other preprocedural examination: Secondary | ICD-10-CM

## 2014-01-30 DIAGNOSIS — R9439 Abnormal result of other cardiovascular function study: Secondary | ICD-10-CM

## 2014-01-30 DIAGNOSIS — D689 Coagulation defect, unspecified: Secondary | ICD-10-CM

## 2014-01-30 LAB — BASIC METABOLIC PANEL
BUN: 56 mg/dL — AB (ref 6–23)
CALCIUM: 8.5 mg/dL (ref 8.4–10.5)
CO2: 30 meq/L (ref 19–32)
CREATININE: 4.14 mg/dL — AB (ref 0.50–1.35)
Chloride: 97 mEq/L (ref 96–112)
GLUCOSE: 160 mg/dL — AB (ref 70–99)
Potassium: 3.9 mEq/L (ref 3.5–5.3)
Sodium: 140 mEq/L (ref 135–145)

## 2014-01-30 LAB — CBC
HCT: 36.9 % — ABNORMAL LOW (ref 39.0–52.0)
Hemoglobin: 12.5 g/dL — ABNORMAL LOW (ref 13.0–17.0)
MCH: 32.1 pg (ref 26.0–34.0)
MCHC: 33.9 g/dL (ref 30.0–36.0)
MCV: 94.6 fL (ref 78.0–100.0)
PLATELETS: 278 10*3/uL (ref 150–400)
RBC: 3.9 MIL/uL — ABNORMAL LOW (ref 4.22–5.81)
RDW: 13.9 % (ref 11.5–15.5)
WBC: 9.4 10*3/uL (ref 4.0–10.5)

## 2014-01-30 LAB — PROTIME-INR
INR: 0.95 (ref <1.50)
PROTHROMBIN TIME: 12.7 s (ref 11.6–15.2)

## 2014-01-30 LAB — APTT: APTT: 30 s (ref 24–37)

## 2014-01-30 LAB — TSH: TSH: 0.7 u[IU]/mL (ref 0.350–4.500)

## 2014-01-30 NOTE — Telephone Encounter (Signed)
Returning your call. °

## 2014-01-30 NOTE — Telephone Encounter (Signed)
Patient/wife notified of need for cath. Explained where to go for labs/cxr. Explained to pack overnight bag. Patient does peritoneal dialysis q4h and they were concerned over when to do this. Talked with Gaspar Bidding, PA and he recommended to keep him on normal dialysis schedule and have renal come see him. His nephrologist is Dr. Joelyn Oms with [facility].   Scheduler notified and will set up heart cath.   Consulted with Gaspar Bidding, PA on whether patient will need pre-admission, he said he should be OK

## 2014-01-30 NOTE — Telephone Encounter (Signed)
RN had spoke with Arville Go, Dr. Adin Hector nurse, regarding Jack Chung having heart cath tomorrow with Dr. Claiborne Billings - to pass along to Dr. Joelyn Oms.   Dr. Joelyn Oms returned call and said to Bishop as patient still has some kidney function.   If he is to be admitted/stay overnight and nephrology consult is necessary  Patient/wife can do peritoneal dialysis themselves.

## 2014-01-30 NOTE — Telephone Encounter (Signed)
Deedie C Creed Fidel Levy, RN; Pixie Casino, MD            Patient's wife called and stated patient's kidney doctor told him today that based on his recent tests results he needed a heart cath. She is calling to find out next steps. I explained you and [name] both were out of the office but that I would check with you and we will call her back on Wednesday.  Thanks  Deedie     Returned call - left message for patient to call back regarding heart cath/next steps. Provided patient with RN direct extension #

## 2014-01-31 ENCOUNTER — Encounter (HOSPITAL_COMMUNITY)
Admission: RE | Disposition: A | Discharge status: Home / Self Care | Payer: Self-pay | Source: Ambulatory Visit | Attending: Cardiovascular Disease

## 2014-01-31 ENCOUNTER — Ambulatory Visit (HOSPITAL_COMMUNITY)
Admission: RE | Admit: 2014-01-31 | Discharge: 2014-01-31 | Disposition: A | Discharge status: Home / Self Care | Payer: Medicare Other | Source: Ambulatory Visit | Attending: Cardiovascular Disease | Admitting: Cardiovascular Disease

## 2014-01-31 ENCOUNTER — Encounter (HOSPITAL_COMMUNITY): Payer: Self-pay | Admitting: Pharmacy Technician

## 2014-01-31 DIAGNOSIS — E785 Hyperlipidemia, unspecified: Secondary | ICD-10-CM | POA: Insufficient documentation

## 2014-01-31 DIAGNOSIS — Z794 Long term (current) use of insulin: Secondary | ICD-10-CM | POA: Diagnosis not present

## 2014-01-31 DIAGNOSIS — I2582 Chronic total occlusion of coronary artery: Secondary | ICD-10-CM | POA: Insufficient documentation

## 2014-01-31 DIAGNOSIS — Z7982 Long term (current) use of aspirin: Secondary | ICD-10-CM | POA: Diagnosis not present

## 2014-01-31 DIAGNOSIS — G4730 Sleep apnea, unspecified: Secondary | ICD-10-CM | POA: Diagnosis not present

## 2014-01-31 DIAGNOSIS — IMO0002 Reserved for concepts with insufficient information to code with codable children: Secondary | ICD-10-CM | POA: Diagnosis not present

## 2014-01-31 DIAGNOSIS — Z01818 Encounter for other preprocedural examination: Secondary | ICD-10-CM

## 2014-01-31 DIAGNOSIS — I252 Old myocardial infarction: Secondary | ICD-10-CM | POA: Insufficient documentation

## 2014-01-31 DIAGNOSIS — I120 Hypertensive chronic kidney disease with stage 5 chronic kidney disease or end stage renal disease: Secondary | ICD-10-CM | POA: Insufficient documentation

## 2014-01-31 DIAGNOSIS — R0609 Other forms of dyspnea: Secondary | ICD-10-CM | POA: Diagnosis present

## 2014-01-31 DIAGNOSIS — D869 Sarcoidosis, unspecified: Secondary | ICD-10-CM | POA: Insufficient documentation

## 2014-01-31 DIAGNOSIS — I509 Heart failure, unspecified: Secondary | ICD-10-CM | POA: Insufficient documentation

## 2014-01-31 DIAGNOSIS — E669 Obesity, unspecified: Secondary | ICD-10-CM | POA: Insufficient documentation

## 2014-01-31 DIAGNOSIS — Z951 Presence of aortocoronary bypass graft: Secondary | ICD-10-CM | POA: Diagnosis not present

## 2014-01-31 DIAGNOSIS — I25119 Atherosclerotic heart disease of native coronary artery with unspecified angina pectoris: Secondary | ICD-10-CM

## 2014-01-31 DIAGNOSIS — E039 Hypothyroidism, unspecified: Secondary | ICD-10-CM | POA: Insufficient documentation

## 2014-01-31 DIAGNOSIS — Z6834 Body mass index (BMI) 34.0-34.9, adult: Secondary | ICD-10-CM | POA: Insufficient documentation

## 2014-01-31 DIAGNOSIS — R0989 Other specified symptoms and signs involving the circulatory and respiratory systems: Secondary | ICD-10-CM | POA: Insufficient documentation

## 2014-01-31 DIAGNOSIS — Z992 Dependence on renal dialysis: Secondary | ICD-10-CM | POA: Diagnosis not present

## 2014-01-31 DIAGNOSIS — N186 End stage renal disease: Secondary | ICD-10-CM | POA: Diagnosis not present

## 2014-01-31 DIAGNOSIS — I959 Hypotension, unspecified: Secondary | ICD-10-CM | POA: Diagnosis not present

## 2014-01-31 DIAGNOSIS — I2510 Atherosclerotic heart disease of native coronary artery without angina pectoris: Secondary | ICD-10-CM

## 2014-01-31 DIAGNOSIS — I2584 Coronary atherosclerosis due to calcified coronary lesion: Secondary | ICD-10-CM | POA: Insufficient documentation

## 2014-01-31 HISTORY — PX: LEFT HEART CATHETERIZATION WITH CORONARY/GRAFT ANGIOGRAM: SHX5450

## 2014-01-31 LAB — GLUCOSE, CAPILLARY
GLUCOSE-CAPILLARY: 112 mg/dL — AB (ref 70–99)
Glucose-Capillary: 103 mg/dL — ABNORMAL HIGH (ref 70–99)

## 2014-01-31 SURGERY — LEFT HEART CATHETERIZATION WITH CORONARY/GRAFT ANGIOGRAM
Anesthesia: LOCAL

## 2014-01-31 MED ORDER — ASPIRIN 81 MG PO CHEW
81.0000 mg | CHEWABLE_TABLET | ORAL | Status: AC
  Administered 2014-01-31: 81 mg via ORAL

## 2014-01-31 MED ORDER — METOPROLOL TARTRATE 12.5 MG HALF TABLET: 12.5000 mg | ORAL_TABLET | Freq: Two times a day (BID) | ORAL | Status: DC

## 2014-01-31 MED ORDER — ACETAMINOPHEN 325 MG PO TABS: 650.0000 mg | ORAL_TABLET | ORAL | Status: DC | PRN

## 2014-01-31 MED ORDER — NITROGLYCERIN 1 MG/10 ML FOR IR/CATH LAB
INTRA_ARTERIAL | Status: AC
  Filled 2014-01-31: qty 10

## 2014-01-31 MED ORDER — SODIUM CHLORIDE 0.9 % IV SOLN: INTRAVENOUS | Status: DC

## 2014-01-31 MED ORDER — ASPIRIN EC 81 MG PO TBEC: 81.0000 mg | DELAYED_RELEASE_TABLET | Freq: Every day | ORAL | Status: DC

## 2014-01-31 MED ORDER — SODIUM CHLORIDE 0.9 % IV SOLN
INTRAVENOUS | Status: DC
  Administered 2014-01-31: 08:00:00 via INTRAVENOUS

## 2014-01-31 MED ORDER — HEPARIN (PORCINE) IN NACL 2-0.9 UNIT/ML-% IJ SOLN
INTRAMUSCULAR | Status: AC
  Filled 2014-01-31: qty 1000

## 2014-01-31 MED ORDER — MIDAZOLAM HCL 2 MG/2ML IJ SOLN
INTRAMUSCULAR | Status: AC
  Filled 2014-01-31: qty 2

## 2014-01-31 MED ORDER — ONDANSETRON HCL 4 MG/2ML IJ SOLN: 4.0000 mg | Freq: Four times a day (QID) | INTRAMUSCULAR | Status: DC | PRN

## 2014-01-31 MED ORDER — FENTANYL CITRATE 0.05 MG/ML IJ SOLN
INTRAMUSCULAR | Status: AC
  Filled 2014-01-31: qty 2

## 2014-01-31 MED ORDER — ASPIRIN 81 MG PO CHEW
CHEWABLE_TABLET | ORAL | Status: AC
  Administered 2014-01-31: 81 mg via ORAL
  Filled 2014-01-31: qty 1

## 2014-01-31 MED ORDER — ISOSORBIDE MONONITRATE ER 30 MG PO TB24: 30.0000 mg | ORAL_TABLET | Freq: Every day | ORAL | Status: DC

## 2014-01-31 MED ORDER — LIDOCAINE HCL (PF) 1 % IJ SOLN
INTRAMUSCULAR | Status: AC
  Filled 2014-01-31: qty 30

## 2014-01-31 MED ORDER — ISOSORBIDE MONONITRATE ER 30 MG PO TB24
30.0000 mg | ORAL_TABLET | Freq: Every day | ORAL | Status: DC
  Administered 2014-01-31: 30 mg via ORAL
  Filled 2014-01-31: qty 1

## 2014-01-31 MED ORDER — SODIUM CHLORIDE 0.9 % IJ SOLN: 3.0000 mL | INTRAMUSCULAR | Status: DC | PRN

## 2014-01-31 NOTE — CV Procedure (Signed)
Jack Chung is a 73 y.o. male    [id number]  [id number] LOCATION:  FACILITY: [facility]  PHYSICIAN: Troy Sine, MD, [facility] 1940-07-25   DATE OF PROCEDURE:  01/31/2014    CARDIAC CATHETERIZATION     HISTORY:    Jack Chung is a 73 y.o. male   PROCEDURE: Left heart catheterization: Coronary angiography with selective angiography into saphenous vein graft and left internal mammary artery; left ventricular pressure recording without left ventriculography due to the patient's renal failure.  The patient was brought to the [facility] cardiac catherization labaratory in the postabsorptive state. He was premedicated with Versed 2 mg and fentanyl 25 mcg intravenously. His  right groin was prepped and shaved in usual sterile fashion. Xylocaine 1% was used for local anesthesia. A 5 French sheath was inserted into the R femoral artery. Diagnostic catheterizatiion was done with 5 [place] FL4, FR4, and pigtail catheters. Left ventricular pressures were recorded but left ventriculography was not performed due to the patient's renal failure. Hemostasis was obtained by direct manual compression. The patient tolerated the procedure well.   HEMODYNAMICS:   Central Aorta: 103/55   Left Ventricle: 103/15  ANGIOGRAPHY:  Fluoroscopy revealed coronary calcification.  Left main: Angiographically normal and bifurcated into the LAD and left circumflex system.   LAD: There is calcification in the proximal LAD.  After the take off a small first diagonal vessel and a bifurcating septal vessel there was a 95% stenosis prior to the second diagonal vessel.  The LAD was then subtotally occluded beyond the second diagonal vessel and there was a faint appearance of a "flush and fill " phenomenon due to competitive LIMA flow.  Left circumflex: Subtotally/totally occluded proximally with faint visualization into a marginal vessel.  The AV groove circumflex was totally occluded after the marginal vessel  takeoff.  Right coronary artery: Totally occluded mid vessel  LIMA to LAD: Widely patent graft, which anastomoses into a small mid distal LAD system.  There was diffuse 80% narrowing in the LAD beyond the anastomosis and there was diffuse 95% apical LAD stenosis in a very small caliber vessel.  SVG to the RCA was a large patent graft which seemed to sequentially supply the PDA and also supplied the distal circumflex marginal/PLA-like vessel.    Total contrast used: 65 cc Omnipaque   IMPRESSION:  Severe native coronary artery disease with evidence for coronary calcification and 95% proximal LAD stenosis between the first and second diagonal vessel with subtotal LAD occlusion beyond the second diagonal vessel; subtotal occlusion of the native circumflex vessel proximally with faint filling of a marginal branch prior to total mid occlusion, and total occlusion of the mid RCA.  Patent  LIMA graft supplying the mid LAD with diffuse 80% mid LAD stenoses and a small caliber vessel and 95% apical LAD stenosis.  Sequential vein graft supplying the PDA branch of the RCA and a distal marginal-like branch of the left circumflex coronary artery.  RECOMMENDATION:  Jack Chung has several potential sources of ischemia , including his distal and apical LAD territory, the territory of his second diagonal vessel, as well as his proximal circumflex and marginal territory.  He has end-stage renal disease on peritoneal dialysis, but still makes some urine.  The study was done with limited contrast.  Initially, an increased medical regimen will be recommended.  However, consideration for PCI to the LAD territory may be necessary if continued symptomatology occurs with increased medical management.  Troy Sine, MD, [facility] 01/31/2014  2:35 PM

## 2014-01-31 NOTE — Progress Notes (Signed)
Armanda Heritage notified of order for iv fluids and rate at 30cc/hr per Armanda Heritage; ordered first dose of imdur from pharmacy

## 2014-01-31 NOTE — Progress Notes (Signed)
Site area: Right groin a 5 fr sheath removed  Site Prior to Removal:  Level 0  Pressure Applied For 20 MINUTES    Minutes Beginning at 1300  Manual:   Yes.    Patient Status During Pull:  stable  Post Pull Groin Site:  Level 0  Post Pull Instructions Given:  Yes.    Post Pull Pulses Present:  Yes.    Dressing Applied:  Yes.    Comments:  VS remain stable  And denies any discomfort at this time.

## 2014-01-31 NOTE — Discharge Instructions (Signed)
Arteriogram Care After These instructions give you information on caring for yourself after your procedure. Your doctor may also give you more specific instructions. Call your doctor if you have any problems or questions after your procedure. HOME CARE  Keep your leg straight for at least 6 hours.  Do not bathe, swim, or use a hot tub until directed by your doctor. You can shower.  Do not lift anything heavier than 10 pounds (about a gallon of milk) for 2 days.  Do not walk a lot, run, or drive for 2 days.  Return to normal activities in 2 days or as told by your doctor. Finding out the results of your test Ask when your test results will be ready. Make sure you get your test results. GET HELP RIGHT AWAY IF:   You have fever.  You have more pain in your leg.  The leg that was cut is:  Bleeding.  Puffy (swollen) or red.  Cold.  Pale or changes color.  Weak.  Tingly or numb. If you go to the Emergency Room, tell your nurse that you have had an arteriogram. Take this paper with you to show the nurse. MAKE SURE YOU:  Understand these instructions.  Will watch your condition.  Will get help right away if you are not doing well or get worse. Document Released: 08/20/2008 Document Revised: 05/29/2013 Document Reviewed: 08/20/2008 [facility] Patient Information 2015 [place], [place]. This information is not intended to replace advice given to you by your health care provider. Make sure you discuss any questions you have with your health care provider. Isosorbide Mononitrate extended-release tablets What is this medicine? ISOSORBIDE MONONITRATE (eye soe SOR bide mon oh NYE trate) is a vasodilator. It relaxes blood vessels, increasing the blood and oxygen supply to your heart. This medicine is used to prevent chest pain caused by angina. It will not help to stop an episode of chest pain. This medicine may be used for other purposes; ask your health care provider or pharmacist if  you have questions. COMMON BRAND NAME(S): Imdur, Isotrate ER What should I tell my health care provider before I take this medicine? They need to know if you have any of these conditions: -previous heart attack or heart failure -an unusual or allergic reaction to isosorbide mononitrate, nitrates, other medicines, foods, dyes, or preservatives -pregnant or trying to get pregnant -breast-feeding How should I use this medicine? Take this medicine by mouth with a glass of water. Follow the directions on the prescription label. Do not crush or chew. Take your medicine at regular intervals. Do not take your medicine more often than directed. Do not stop taking this medicine except on the advice of your doctor or health care professional. Talk to your pediatrician regarding the use of this medicine in children. Special care may be needed. Overdosage: If you think you have taken too much of this medicine contact a poison control center or emergency room at once. NOTE: This medicine is only for you. Do not share this medicine with others. What if I miss a dose? If you miss a dose, take it as soon as you can. If it is almost time for your next dose, take only that dose. Do not take double or extra doses. What may interact with this medicine? Do not take this medicine with any of the following medications: -medicines used to treat erectile dysfunction (ED) like avanafil, sildenafil, tadalafil, and vardenafil -riociguat This medicine may also interact with the following medications: -medicines for  high blood pressure -other medicines for angina or heart failure This list may not describe all possible interactions. Give your health care provider a list of all the medicines, herbs, non-prescription drugs, or dietary supplements you use. Also tell them if you smoke, drink alcohol, or use illegal drugs. Some items may interact with your medicine. What should I watch for while using this medicine? Check your  heart rate and blood pressure regularly while you are taking this medicine. Ask your doctor or health care professional what your heart rate and blood pressure should be and when you should contact him or her. Tell your doctor or health care professional if you feel your medicine is no longer working. You may get dizzy. Do not drive, use machinery, or do anything that needs mental alertness until you know how this medicine affects you. To reduce the risk of dizzy or fainting spells, do not sit or stand up quickly, especially if you are an older patient. Alcohol can make you more dizzy, and increase flushing and rapid heartbeats. Avoid alcoholic drinks. Do not treat yourself for coughs, colds, or pain while you are taking this medicine without asking your doctor or health care professional for advice. Some ingredients may increase your blood pressure. What side effects may I notice from receiving this medicine? Side effects that you should report to your doctor or health care professional as soon as possible: -bluish discoloration of lips, fingernails, or palms of hands -irregular heartbeat, palpitations -low blood pressure -nausea, vomiting -persistent headache -unusually weak or tired Side effects that usually do not require medical attention (report to your doctor or health care professional if they continue or are bothersome): -flushing of the face or neck -rash This list may not describe all possible side effects. Call your doctor for medical advice about side effects. You may report side effects to FDA at 1-800-FDA-1088. Where should I keep my medicine? Keep out of the reach of children. Store between 15 and 30 degrees C (59 and 86 degrees F). Keep container tightly closed. Throw away any unused medicine after the expiration date. NOTE: This sheet is a summary. It may not cover all possible information. If you have questions about this medicine, talk to your doctor, pharmacist, or health care  provider.  2015, Elsevier/Gold Standard. (2013-03-23 14:48:19)

## 2014-01-31 NOTE — H&P (View-Only) (Signed)
OFFICE NOTE  Chief Complaint:  Progressive DOE  Primary Care Physician: [name], MD  HPI:  Jack Chung is a 73 year old male who was referred to me for to establish cardiac care. 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 In 2011 apparently he was working on putting a roof on his house and came in later in the day. His wife found him apparently sitting by the side of his bed and he was blue, confused and short of breath. She called 911 and was taken to the emergency room. Ultimately his EKG demonstrated probably acute MI and he underwent cardiac catheterization which showed multivessel coronary artery disease. At that time there were no clear areas that would be amenable to percutaneous intervention. A surgical consult was obtained and per his report 3 different surgeons would not operate on him but eventually Dr. Roxanna Mew did.  According to his operative report there was significant multivessel coronary disease. The LAD was heavily calcified throughout its length and closed 95% proximally. The mid and distal portion of the vessel had a lumen diameter 1.25 mm and were heavily calcified. The circumflex obtuse marginal branch was patent. The posterior lateral branch was a 2 mm vessel. The right coronary artery was totally occluded. The posterior descending artery was also a very small vessel about 1.5-2 mm. He underwent a three-vessel bypass and did fairly well although had a difficult recovery. 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  He was recently hospitalized for fever of unknown origin. He underwent an echocardiogram which showed an EF of 50-55% with grade 2 diastolic dysfunction. Since that time he said progressive shortness of breath but denies any chest discomfort. He said he never had  any chest pain even prior to his episode where he presented for bypass surgery. Angina may not be a reliable marker this gentleman.  PMHx:  Past Medical History  Diagnosis Date  . Hypertension   . Diabetes mellitus   . Hypothyroidism   . CHF (congestive heart failure)   . Anemia   . Blind left eye   . Sleep apnea     not on CPAP  . Coronary artery disease     CABG x 11 Jun 2009.  MRSA infections of incsions  . Arthritis   . Sarcoidosis     Hx:of  . Diabetic retinopathy     Hx: of bilateral  . Edema   . Hyperlipidemia   . 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 Atherosclerosis of native arteries of the extremities, unspecified   . Eczema     Hx: of  . Insomnia   . Seasonal allergies   . ESRD on dialysis     Was on dialysis in July 2011 and then stopped and restarted May 2014. He then transitioned to PD in Aug 2014. He does PD on cycler when at home and CAPD 6 exchanges per day when travelling. His wife is "in charge" and does the exchanges.   . History of blood transfusion     with heart surgery  . Coronary atherosclerosis of native coronary artery   . Atherosclerosis of native arteries of the extremities, unspecified     Past Surgical History  Procedure Laterality Date  . Cardiac surgery      total of 6 surgeries, 5 related to mrsa  .  Coronary artery bypass graft  06/2009  . Cataract surgery      Hx: of  . Debridements      Hx: of secondary to MRSA  . Av fistula placement Right 10/11/2012    Procedure: ARTERIOVENOUS (AV) FISTULA CREATION;  Surgeon: Rosetta Posner, MD;  Location: [place];  Service: Vascular;  Laterality: Right;  . Insertion of dialysis catheter Left   . Capd insertion N/A 11/28/2012    Procedure: LAPAROSCOPIC PERITONEAL DIALYSIS CATHETER PLACEMENT;  Surgeon: Ralene Ok, MD;  Location: [place];  Service: General;  Laterality: N/A;    FAMHx:  Family History  Problem Relation Age of Onset  . COPD Mother   . COPD Father      SOCHx:   reports that he has never smoked. He has never used smokeless tobacco. He reports that he does not drink alcohol or use illicit drugs.  ALLERGIES:  Allergies  Allergen Reactions  . Flu Virus Vaccine     Gets the flu, patient has bad reaction to the flu vaccine.  . Lipitor [Atorvastatin]     Leg cramps    ROS: A comprehensive review of systems was negative except for: Constitutional: positive for fatigue Respiratory: positive for dyspnea on exertion  HOME MEDS: Current Outpatient Prescriptions  Medication Sig Dispense Refill  . aspirin 325 MG tablet Take 325 mg by mouth daily.      Marland Kitchen b complex-vitamin c-folic acid (NEPHRO-VITE) 0.8 MG TABS Take 1 tablet by mouth at bedtime.  90 tablet  3  . calcium carbonate (TUMS - DOSED IN MG ELEMENTAL CALCIUM) 500 MG chewable tablet Chew 1 tablet by mouth 3 (three) times daily with meals.      . Calcium Carbonate-Vitamin D (CALCIUM + D PO) Take 1 tablet by mouth daily.       . furosemide (LASIX) 40 MG tablet Take 40 mg by mouth 2 (two) times daily.      Marland Kitchen gentamicin cream (GARAMYCIN) 0.1 % Apply 1 application topically daily.      Marland Kitchen glucose blood (ACCU-CHEK AVIVA PLUS) test strip Use one strip to check glucose once daily Dx 250.00  100 each  12  . insulin aspart (NOVOLOG) 100 UNIT/ML injection Inject 10 Units into the skin 3 (three) times daily with meals. Take none if blood sugar < 150, 151-200 2 units, 201-250 4 units, 251-300 6 units, 301-350 8 units      . Insulin Glargine (LANTUS) 100 UNIT/ML Solostar Pen Inject 45 units into the skin daily. Dx 250.00  15 mL  6  . Insulin Pen Needle (NOVOFINE) 30G X 8 MM MISC Use with Insulin Pen Dx 250.00  100 each  6  . lactulose (CHRONULAC) 10 GM/15ML solution Take 10 g by mouth every 4 (four) hours as needed for mild constipation.      Marland Kitchen levothyroxine (SYNTHROID, LEVOTHROID) 112 MCG tablet LABS/APPOINTMENT OVERDUE, 1 by mouth daily in the morning 30 minutes before breakfast for Thyroid  15  tablet  0  . metoprolol (LOPRESSOR) 100 MG tablet TAKE ONE TABLET BY MOUTH EVERY 12 HOURS FOR BLOOD PRESSURE  180 tablet  0  . PERITONEAL DIALYSIS SOLUTIONS IP Inject 2,500 Units into the peritoneum 4 (four) times daily.       . potassium chloride SA (K-DUR,KLOR-CON) 20 MEQ tablet Take 20 mEq by mouth daily.      . predniSONE (DELTASONE) 1 MG tablet Take 1 mg by mouth 2 (two) times daily.      . rosuvastatin (  CRESTOR) 20 MG tablet Take 20 mg by mouth daily.       No current facility-administered medications for this visit.    LABS/IMAGING: No results found for this or any previous visit (from the past 48 hour(s)). No results found.  VITALS: BP 77/54  Pulse 78  Ht 5\' 10"  (1.778 m)  Wt 243 lb (110.224 kg)  BMI 34.87 kg/m2  EXAM: General appearance: alert and no distress Neck: no carotid bruit and no JVD Lungs: clear to auscultation bilaterally Heart: regular rate and rhythm, S1, S2 normal, no murmur, click, rub or gallop Abdomen: soft, non-tender; bowel sounds normal; no masses,  no organomegaly Extremities: edema 1+ Pulses: 2+ and symmetric Skin: Skin color, texture, turgor normal. No rashes or lesions Neurologic: Mental status: Alert, oriented, thought content appropriate  EKG: Sinus rhythm with first degree A-V block, PVCs, right bundle branch block, inferior infarct, old at 7  ASSESSMENT: 1. Multivessel coronary artery disease status post 3 vessel CABG in 2011 2. Insulin-dependent diabetes 3. Sarcoidosis on prednisone 4. Hypertension 5. Dyslipidemia 6. End-stage renal disease on peritoneal dialysis 7. Obesity 8. Recent fever of unknown origin 9. Symptomatic hypotension  PLAN: 1.   Mr. Treece is reported progressive shortness of breath but no chest discomfort. He is never had anginal symptoms even before his bypass. His EF is 50-55% several months ago but has had progressive shortness of breath since then. He also recently had a fever of unknown origin and was  hospitalized for this. Workup did not show any peritonitis or infection. He continues to have problems with labile blood pressures and significant hypotension which has required him to decrease the dose of his metoprolol. Currently is taking 25 mg twice daily however his blood pressure remains low and was only in the 28U systolic today. I reviewed his home dialysis records and indicates that his blood pressures have been very low there is well. I've recommended decreasing his metoprolol to 12.5 mg twice daily. I also would recommend a stress test and repeat echocardiogram to make sure there's been no new cardiomyopathy or there is no worsening ischemia. Based on the poor targets and the fact that he was not a good surgical candidate, the likelihood of him having long-standing benefit from his bypass is fairly low.  Plan to see him back to discuss results of these studies. Thank you again for the kind referral.  Pixie Casino, MD, [facility] Attending Cardiologist [facility]  HILTY,Kenneth C 01/08/2014, 4:48 PM

## 2014-01-31 NOTE — Interval H&P Note (Signed)
Cath Lab Visit (complete for each Cath Lab visit)  Clinical Evaluation Leading to the Procedure:   ACS: No.  Non-ACS:    Anginal Classification: CCS III  Anti-ischemic medical therapy: Minimal Therapy (1 class of medications)  Non-Invasive Test Results: Intermediate-risk stress test findings: cardiac mortality 1-3%/year  Prior CABG: Previous CABG      History and Physical Interval Note:  01/31/2014 12:11 PM  Jack Chung  has presented today for surgery, with the diagnosis of cp  The various methods of treatment have been discussed with the patient and family. After consideration of risks, benefits and other options for treatment, the patient has consented to  Procedure(s): LEFT HEART CATHETERIZATION WITH CORONARY/GRAFT ANGIOGRAM (N/Chung) as Chung surgical intervention .  The patient's history has been reviewed, patient examined, no change in status, stable for surgery.  I have reviewed the patient's chart and labs.  Questions were answered to the patient's satisfaction.     Jack Chung

## 2014-02-04 ENCOUNTER — Encounter: Payer: Self-pay | Admitting: Cardiovascular Disease

## 2014-02-21 ENCOUNTER — Ambulatory Visit (INDEPENDENT_AMBULATORY_CARE_PROVIDER_SITE_OTHER): Payer: Medicare Other | Admitting: Internal Medicine

## 2014-02-21 ENCOUNTER — Encounter: Payer: Self-pay | Admitting: Internal Medicine

## 2014-02-21 VITALS — BP 117/68 | HR 78 | Ht 70.0 in | Wt 249.0 lb

## 2014-02-21 DIAGNOSIS — N186 End stage renal disease: Secondary | ICD-10-CM

## 2014-02-21 DIAGNOSIS — Z992 Dependence on renal dialysis: Secondary | ICD-10-CM

## 2014-02-21 DIAGNOSIS — E1165 Type 2 diabetes mellitus with hyperglycemia: Secondary | ICD-10-CM

## 2014-02-21 DIAGNOSIS — E1129 Type 2 diabetes mellitus with other diabetic kidney complication: Secondary | ICD-10-CM

## 2014-02-21 DIAGNOSIS — Z951 Presence of aortocoronary bypass graft: Secondary | ICD-10-CM

## 2014-02-21 DIAGNOSIS — I2510 Atherosclerotic heart disease of native coronary artery without angina pectoris: Secondary | ICD-10-CM

## 2014-02-21 NOTE — Patient Instructions (Signed)
Your physician recommends that you schedule a follow-up appointment in: 6 Months  Continue current medications

## 2014-02-21 NOTE — Progress Notes (Signed)
OFFICE NOTE  Chief Complaint:  Progressive DOE  Primary Care Physician: [name], MD  HPI:  Jack Chung is a 73 year old male who was referred to me for to establish cardiac care. 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 In 2011 apparently he was working on putting a roof on his house and came in later in the day. His wife found him apparently sitting by the side of his bed and he was blue, confused and short of breath. She called 911 and was taken to the emergency room. Ultimately his EKG demonstrated probably acute MI and he underwent cardiac catheterization which showed multivessel coronary artery disease. At that time there were no clear areas that would be amenable to percutaneous intervention. A surgical consult was obtained and per his report 3 different surgeons would not operate on him but eventually Dr. Roxanna Mew did.  According to his operative report there was significant multivessel coronary disease. The LAD was heavily calcified throughout its length and closed 95% proximally. The mid and distal portion of the vessel had a lumen diameter 1.25 mm and were heavily calcified. The circumflex obtuse marginal branch was patent. The posterior lateral branch was a 2 mm vessel. The right coronary artery was totally occluded. The posterior descending artery was also a very small vessel about 1.5-2 mm. He underwent a three-vessel bypass and did fairly well although had a difficult recovery. 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  He was recently hospitalized for fever of unknown origin. He underwent an echocardiogram which showed an EF of 50-55% with grade 2 diastolic dysfunction. Since that time he said progressive shortness of breath but denies any chest discomfort. He said he never had  any chest pain even prior to his episode where he presented for bypass surgery. Angina may not be a reliable marker this gentleman.  Jack Chung returns for followup. In the interim he underwent nuclear stress test which was intermediate and show partial reversibility concerning for ischemia. He was ultimately referred for cardiac catheterization which was performed by Dr. Claiborne Billings. This demonstrated the following findings:  IMPRESSION:  Severe native coronary artery disease with evidence for coronary calcification and 95% proximal LAD stenosis between the first and second diagonal vessel with subtotal LAD occlusion beyond the second diagonal vessel; subtotal occlusion of the native circumflex vessel proximally with faint filling of a marginal branch prior to total mid occlusion, and total occlusion of the mid RCA. Patent LIMA graft supplying the mid LAD with diffuse 80% mid LAD stenoses and a small caliber vessel and 95% apical LAD stenosis. Sequential vein graft supplying the PDA branch of the RCA and a distal marginal-like branch of the left circumflex coronary artery.  PMHx:  Past Medical History  Diagnosis Date  . Hypertension   . Diabetes mellitus   . Hypothyroidism   . CHF (congestive heart failure)   . Anemia   . Blind left eye   . Sleep apnea     not on CPAP  . Coronary artery disease     CABG x 11 Jun 2009.  MRSA infections of incsions  . Arthritis   . Sarcoidosis     Hx:of  . Diabetic retinopathy     Hx: of bilateral  . Edema   . Hyperlipidemia   . 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 Atherosclerosis of native arteries of the extremities, unspecified   . Eczema     Hx: of  . Insomnia   . Seasonal allergies   . ESRD on dialysis     Was on dialysis in July 2011 and then stopped and restarted May 2014. He then transitioned to PD in Aug 2014. He does PD on cycler when at home and CAPD 6 exchanges per day when travelling. His wife is "in charge" and does  the exchanges.   . History of blood transfusion     with heart surgery  . Coronary atherosclerosis of native coronary artery   . Atherosclerosis of native arteries of the extremities, unspecified     Past Surgical History  Procedure Laterality Date  . Cardiac surgery      total of 6 surgeries, 5 related to mrsa  . Coronary artery bypass graft  06/2009  . Cataract surgery      Hx: of  . Debridements      Hx: of secondary to MRSA  . Av fistula placement Right 10/11/2012    Procedure: ARTERIOVENOUS (AV) FISTULA CREATION;  Surgeon: Rosetta Posner, MD;  Location: [facility];  Service: Vascular;  Laterality: Right;  . Insertion of dialysis catheter Left   . Capd insertion N/A 11/28/2012    Procedure: LAPAROSCOPIC PERITONEAL DIALYSIS CATHETER PLACEMENT;  Surgeon: Ralene Ok, MD;  Location: [facility];  Service: General;  Laterality: N/A;    FAMHx:  Family History  Problem Relation Age of Onset  . COPD Mother   . COPD Father     SOCHx:   reports that he has never smoked. He has never used smokeless tobacco. He reports that he does not drink alcohol or use illicit drugs.  ALLERGIES:  Allergies  Allergen Reactions  . Flu Virus Vaccine     Gets the flu, patient has bad reaction to the flu vaccine.  . Lipitor [Atorvastatin]     Leg cramps    ROS: A comprehensive review of systems was negative.  HOME MEDS: Current Outpatient Prescriptions  Medication Sig Dispense Refill  . aspirin 325 MG tablet Take 325 mg by mouth daily.      Marland Kitchen b complex-vitamin c-folic acid (NEPHRO-VITE) 0.8 MG TABS Take 1 tablet by mouth at bedtime.  90 tablet  3  . calcium carbonate (TUMS - DOSED IN MG ELEMENTAL CALCIUM) 500 MG chewable tablet Chew 1 tablet by mouth 3 (three) times daily with meals.      . Calcium Carbonate-Vitamin D (CALCIUM + D PO) Take 1 tablet by mouth daily.       . furosemide (LASIX) 40 MG tablet Take 40 mg by mouth 2 (two) times daily.      Marland Kitchen gentamicin cream (GARAMYCIN) 0.1 % Apply 1  application topically daily.      . insulin aspart (NOVOLOG) 100 UNIT/ML injection Inject 10-15 Units into the skin 3 (three) times daily with meals. Take none if blood sugar < 150, 151-200 2 units, 201-250 4 units, 251-300 6 units, 301-350 8 units      . Insulin Glargine (LANTUS) 100 UNIT/ML Solostar Pen Inject 45 units into the skin daily. Dx 250.00  15 mL  6  . isosorbide mononitrate (IMDUR) 30 MG 24 hr tablet Take 1 tablet (30 mg total) by mouth daily.  30 tablet  11  . lactulose (CHRONULAC) 10 GM/15ML solution Take 10 g by mouth every 4 (four) hours as needed for mild constipation.      Marland Kitchen  levothyroxine (SYNTHROID, LEVOTHROID) 112 MCG tablet LABS/APPOINTMENT OVERDUE, 1 by mouth daily in the morning 30 minutes before breakfast for Thyroid  15 tablet  0  . metoprolol tartrate (LOPRESSOR) 25 MG tablet Take 12.5 mg by mouth 2 (two) times daily.      Marland Kitchen PERITONEAL DIALYSIS SOLUTIONS IP Inject 2,500 Units into the peritoneum 4 (four) times daily.       . potassium chloride SA (K-DUR,KLOR-CON) 20 MEQ tablet Take 20 mEq by mouth daily.      . predniSONE (DELTASONE) 1 MG tablet Take 1 mg by mouth 2 (two) times daily.      . rosuvastatin (CRESTOR) 10 MG tablet Take 10 mg by mouth daily.       No current facility-administered medications for this visit.    LABS/IMAGING: No results found for this or any previous visit (from the past 48 hour(s)). No results found.  VITALS: BP 117/68  Pulse 78  Ht 5\' 10"  (1.778 m)  Wt 249 lb (112.946 kg)  BMI 35.73 kg/m2  EXAM: deferred  EKG: deferred  ASSESSMENT: 1. Multivessel coronary artery disease status post 3 vessel CABG in 2011 - medical therapy recommended at cath 2. Insulin-dependent diabetes 3. Sarcoidosis on prednisone 4. Hypertension 5. Dyslipidemia 6. End-stage renal disease on peritoneal dialysis 7. Obesity 8. Recent fever of unknown origin 9. Symptomatic hypotension  PLAN: 1.   Mr. Seyfried underwent a cardiac catheterization based on an  abnormal nuclear stress test in symptoms. His catheterization showed no new significant obstruction that was amenable to intervention. It was recommended that he started on isosorbide and he is currently taking that. He notes an improvement in his symptoms and improvement in his shortness of breath. He is now able to walk around more easily without symptoms. I recommend we continue his current medications and we will see him back in 6 months.  Pixie Casino, MD, [facility] Attending Cardiologist [facility]  Darly Massi C 02/21/2014, 5:08 PM

## 2014-03-04 ENCOUNTER — Encounter: Payer: Self-pay | Admitting: Internal Medicine

## 2014-05-16 ENCOUNTER — Encounter (HOSPITAL_COMMUNITY): Payer: Self-pay | Admitting: Cardiovascular Disease

## 2014-06-12 ENCOUNTER — Ambulatory Visit
Admission: RE | Admit: 2014-06-12 | Discharge: 2014-06-12 | Disposition: A | Discharge status: Home / Self Care | Payer: Self-pay | Source: Ambulatory Visit | Attending: Physician Assistant | Admitting: Physician Assistant

## 2014-06-12 ENCOUNTER — Other Ambulatory Visit: Payer: Self-pay | Admitting: Physician Assistant

## 2014-06-12 DIAGNOSIS — R52 Pain, unspecified: Secondary | ICD-10-CM

## 2014-09-05 IMAGING — CR DG CHEST 2V
2 series · 2 of 2 positions shown · non-contrast
Comparison: Chest radiograph performed 10/04/2012

CLINICAL DATA: Shortness of breath.

CHEST - 2 VIEW

[w chest pa]
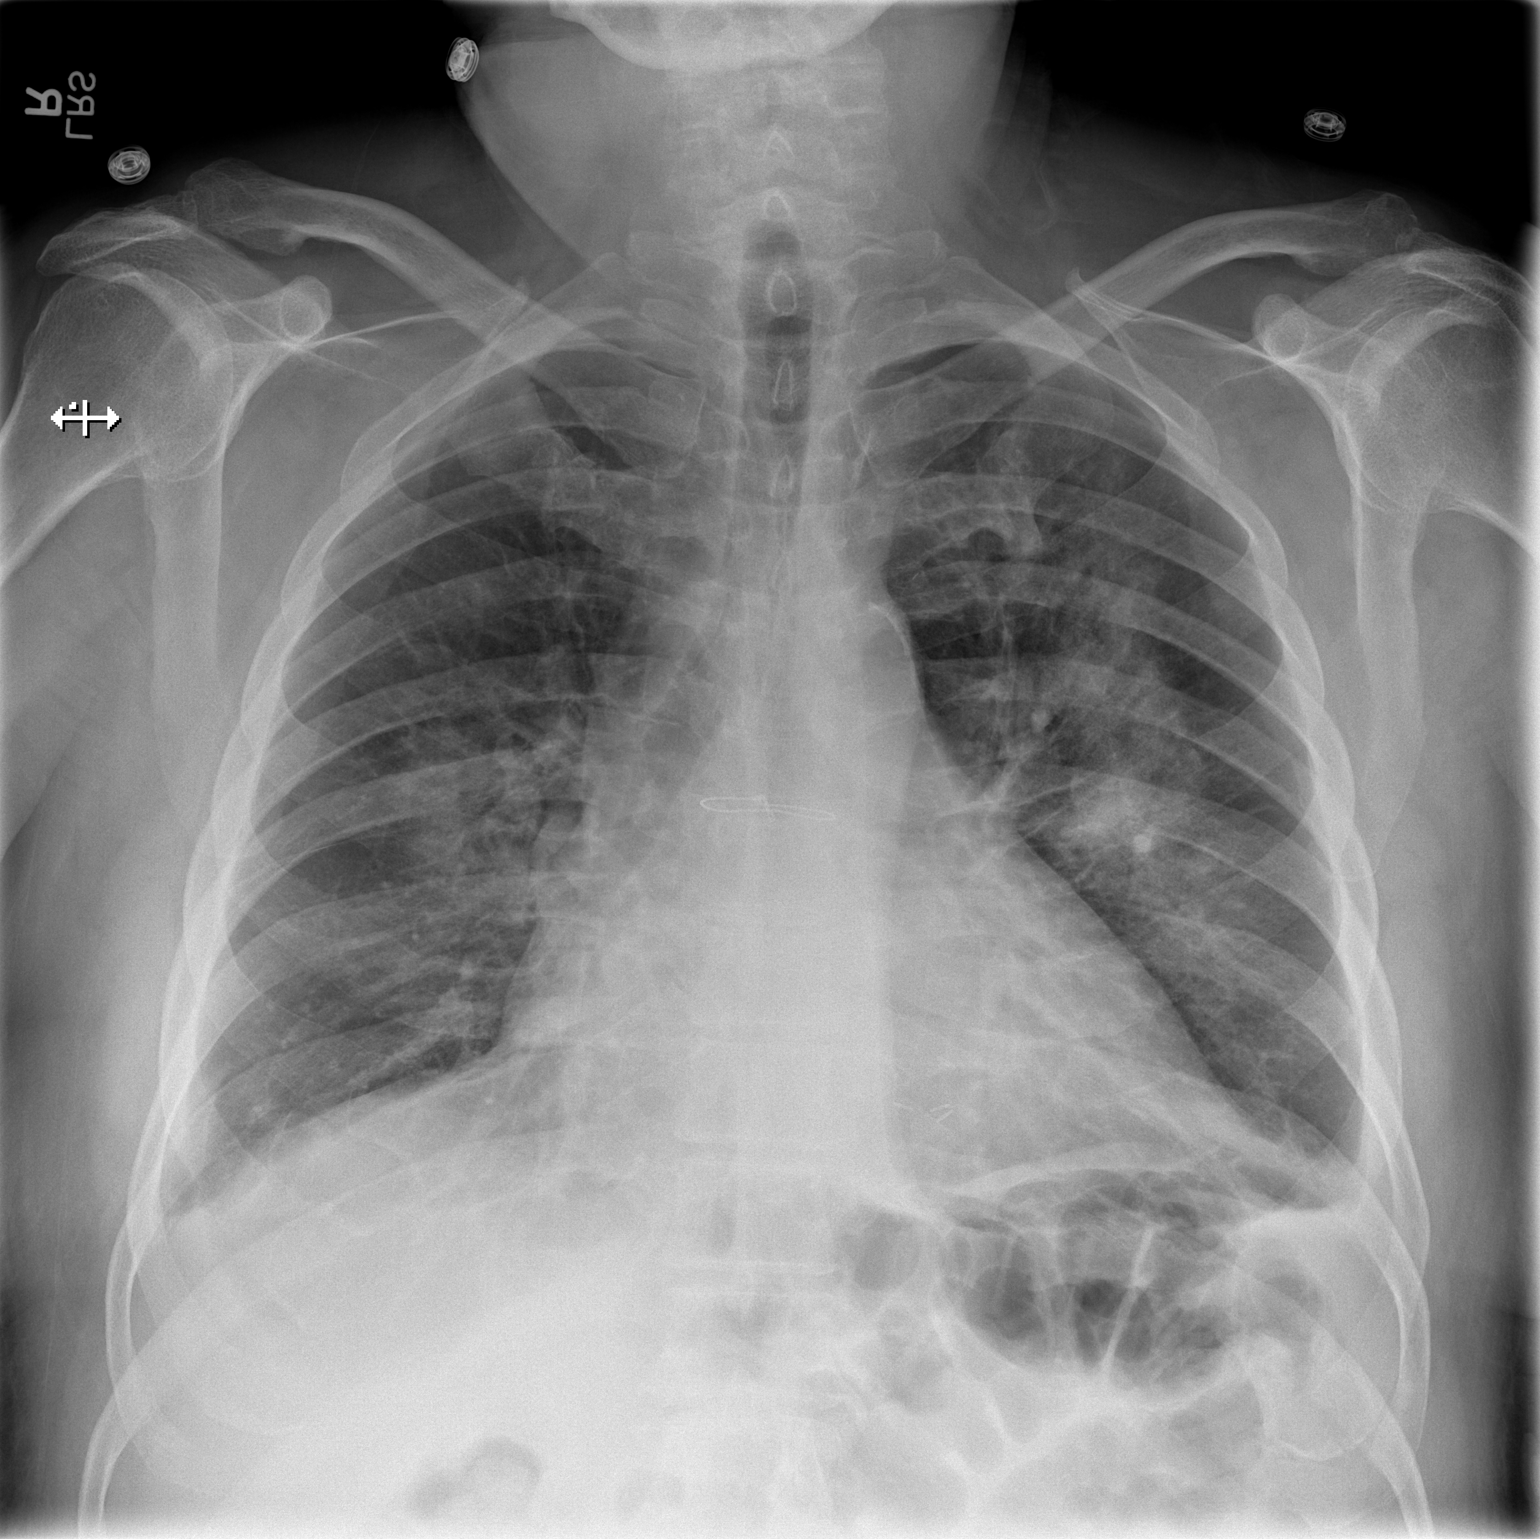

[w chest lat]
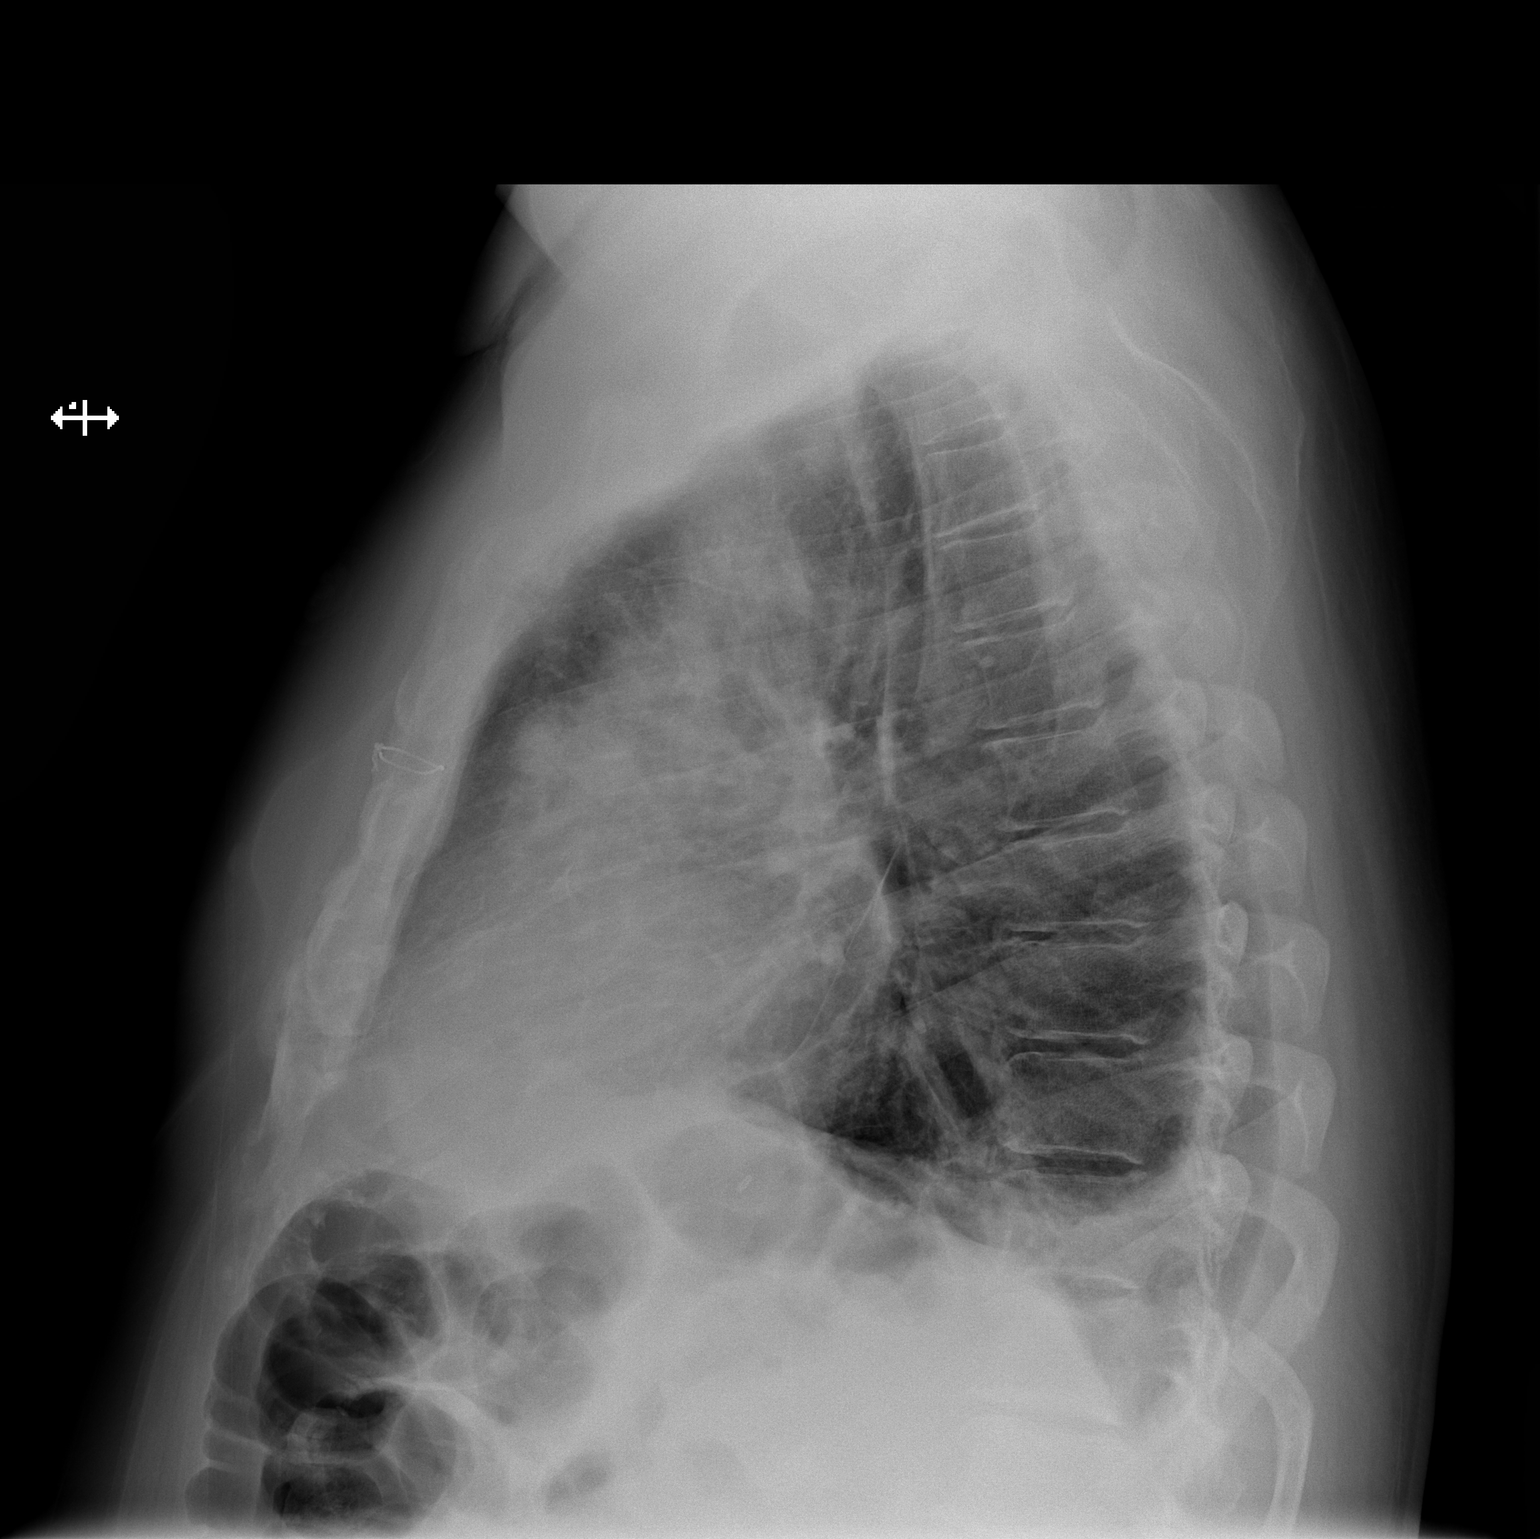

[2 of 2 positions shown; findings below may reference images not displayed]

FINDINGS: The lungs are well-aerated.  Patchy bilateral airspace
opacities are concerning for multifocal pneumonia.  A small left
pleural effusion is seen.  No pneumothorax is identified.  The
appearance is less typical for pulmonary edema.

The heart is mildly enlarged.  Calcification is noted in the aortic
arch.  The patient is status post median sternotomy.  No acute
osseous abnormalities are seen.
IMPRESSION: Patchy bilateral airspace opacities are concerning for multifocal
pneumonia.  Small left pleural effusion noted.  The appearance is
less typical for pulmonary edema.

## 2014-12-30 ENCOUNTER — Encounter: Payer: Self-pay | Admitting: Internal Medicine

## 2014-12-30 ENCOUNTER — Ambulatory Visit (INDEPENDENT_AMBULATORY_CARE_PROVIDER_SITE_OTHER): Payer: Medicare Other | Admitting: Internal Medicine

## 2014-12-30 ENCOUNTER — Telehealth: Payer: Self-pay | Admitting: Internal Medicine

## 2014-12-30 ENCOUNTER — Ambulatory Visit
Admission: RE | Admit: 2014-12-30 | Discharge: 2014-12-30 | Disposition: A | Discharge status: Home / Self Care | Payer: Medicare Other | Source: Ambulatory Visit | Attending: Internal Medicine | Admitting: Internal Medicine

## 2014-12-30 VITALS — HR 99 | Ht 69.0 in | Wt 255.9 lb

## 2014-12-30 DIAGNOSIS — I959 Hypotension, unspecified: Secondary | ICD-10-CM

## 2014-12-30 DIAGNOSIS — R059 Cough, unspecified: Secondary | ICD-10-CM

## 2014-12-30 DIAGNOSIS — R05 Cough: Secondary | ICD-10-CM

## 2014-12-30 DIAGNOSIS — R0600 Dyspnea, unspecified: Secondary | ICD-10-CM | POA: Diagnosis not present

## 2014-12-30 DIAGNOSIS — I255 Ischemic cardiomyopathy: Secondary | ICD-10-CM | POA: Diagnosis not present

## 2014-12-30 DIAGNOSIS — I2583 Coronary atherosclerosis due to lipid rich plaque: Secondary | ICD-10-CM

## 2014-12-30 DIAGNOSIS — I2510 Atherosclerotic heart disease of native coronary artery without angina pectoris: Secondary | ICD-10-CM

## 2014-12-30 DIAGNOSIS — I5040 Unspecified combined systolic (congestive) and diastolic (congestive) heart failure: Secondary | ICD-10-CM

## 2014-12-30 DIAGNOSIS — Z79899 Other long term (current) drug therapy: Secondary | ICD-10-CM

## 2014-12-30 DIAGNOSIS — D869 Sarcoidosis, unspecified: Secondary | ICD-10-CM

## 2014-12-30 DIAGNOSIS — Z951 Presence of aortocoronary bypass graft: Secondary | ICD-10-CM

## 2014-12-30 NOTE — Progress Notes (Signed)
OFFICE NOTE  Chief Complaint:  Cough  Primary Care Physician: [name], MD  HPI:  Jack Chung is a 74 year old male who was referred to me for to establish cardiac care. 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 In 2011 apparently he was working on putting a roof on his house and came in later in the day. His wife found him apparently sitting by the side of his bed and he was blue, confused and short of breath. She called 911 and was taken to the emergency room. Ultimately his EKG demonstrated probably acute MI and he underwent cardiac catheterization which showed multivessel coronary artery disease. At that time there were no clear areas that would be amenable to percutaneous intervention. A surgical consult was obtained and per his report 3 different surgeons would not operate on him but eventually Dr. Roxanna Mew did.  According to his operative report there was significant multivessel coronary disease. The LAD was heavily calcified throughout its length and closed 95% proximally. The mid and distal portion of the vessel had a lumen diameter 1.25 mm and were heavily calcified. The circumflex obtuse marginal branch was patent. The posterior lateral branch was a 2 mm vessel. The right coronary artery was totally occluded. The posterior descending artery was also a very small vessel about 1.5-2 mm. He underwent a three-vessel bypass and did fairly well although had a difficult recovery. 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  He was recently hospitalized for fever of unknown origin. He underwent an echocardiogram which showed an EF of 50-55% with grade 2 diastolic dysfunction. Since that time he said progressive shortness of breath but denies any chest discomfort. He said he never had any chest  pain even prior to his episode where he presented for bypass surgery. Angina may not be a reliable marker this gentleman.  Jack Chung returns for followup. In the interim he underwent nuclear stress test which was intermediate and show partial reversibility concerning for ischemia. He was ultimately referred for cardiac catheterization which was performed by Dr. Claiborne Billings. This demonstrated the following findings:  IMPRESSION:  Severe native coronary artery disease with evidence for coronary calcification and 95% proximal LAD stenosis between the first and second diagonal vessel with subtotal LAD occlusion beyond the second diagonal vessel; subtotal occlusion of the native circumflex vessel proximally with faint filling of a marginal branch prior to total mid occlusion, and total occlusion of the mid RCA. Patent LIMA graft supplying the mid LAD with diffuse 80% mid LAD stenoses and a small caliber vessel and 95% apical LAD stenosis. Sequential vein graft supplying the PDA branch of the RCA and a distal marginal-like branch of the left circumflex coronary artery.  Medical therapy was recommended. He was started on a nitrate and his symptoms of chest pressure have improved.  Jack Chung returns today for follow-up. He was an add-on for persistent cough. Apparently this is been going on for couple of months. He saw his primary care provider who recommended cetirizine. He recently has had a cough which is nonproductive and feels like it's hanging in his chest. Blood pressure is also been low on dialysis days. There is concern for worsening heart failure from his wife who noted that he's had some more swelling in his legs. His weight is stated virtually stable but she feels that he's  eating last and therefore may be gaining equivalent fluid weight. Recently he's had an increase in the ultrafiltration with dialysis to try to remove more fluid. They tell me that he also was noted to have possible peritonitis and is on  vancomycin for an elevated white blood cell count of 13,000. That being said, she felt that her nephrologist did not feel this was SBP but another source of infection.  PMHx:  Past Medical History  Diagnosis Date  . Hypertension   . Diabetes mellitus   . Hypothyroidism   . CHF (congestive heart failure)   . Anemia   . Blind left eye   . Sleep apnea     not on CPAP  . Coronary artery disease     CABG x 11 Jun 2009.  MRSA infections of incsions  . Arthritis   . Sarcoidosis     Hx:of  . Diabetic retinopathy     Hx: of bilateral  . Edema   . Hyperlipidemia   . 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 Atherosclerosis of native arteries of the extremities, unspecified   . Eczema     Hx: of  . Insomnia   . Seasonal allergies   . ESRD on dialysis     Was on dialysis in July 2011 and then stopped and restarted May 2014. He then transitioned to PD in Aug 2014. He does PD on cycler when at home and CAPD 6 exchanges per day when travelling. His wife is "in charge" and does the exchanges.   . History of blood transfusion     with heart surgery  . Coronary atherosclerosis of native coronary artery   . Atherosclerosis of native arteries of the extremities, unspecified     Past Surgical History  Procedure Laterality Date  . Cardiac surgery      total of 6 surgeries, 5 related to mrsa  . Coronary artery bypass graft  06/2009  . Cataract surgery      Hx: of  . Debridements      Hx: of secondary to MRSA  . Av fistula placement Right 10/11/2012    Procedure: ARTERIOVENOUS (AV) FISTULA CREATION;  Surgeon: Rosetta Posner, MD;  Location: [place];  Service: Vascular;  Laterality: Right;  . Insertion of dialysis catheter Left   . Capd insertion N/A 11/28/2012    Procedure: LAPAROSCOPIC PERITONEAL DIALYSIS CATHETER PLACEMENT;  Surgeon: Ralene Ok, MD;  Location: [place];  Service: General;  Laterality: N/A;  . Left heart catheterization with coronary/graft angiogram N/A  01/31/2014    Procedure: LEFT HEART CATHETERIZATION WITH Beatrix Fetters;  Surgeon: Troy Sine, MD;  Location: [facility] CATH LAB;  Service: Cardiovascular;  Laterality: N/A;    FAMHx:  Family History  Problem Relation Age of Onset  . COPD Mother   . COPD Father     SOCHx:   reports that he has never smoked. He has never used smokeless tobacco. He reports that he does not drink alcohol or use illicit drugs.  ALLERGIES:  Allergies  Allergen Reactions  . Flu Virus Vaccine     Gets the flu, patient has bad reaction to the flu vaccine.  . Lipitor [Atorvastatin]     Leg cramps    ROS: A comprehensive review of systems was negative except for: Constitutional: positive for fatigue Cardiovascular: positive for dyspnea and lower extremity edema  HOME MEDS: Current Outpatient Prescriptions  Medication Sig Dispense Refill  . aspirin 325 MG tablet Take 325  mg by mouth daily.    . calcium carbonate (TUMS - DOSED IN MG ELEMENTAL CALCIUM) 500 MG chewable tablet Chew 1 tablet by mouth 3 (three) times daily with meals.    . Calcium Carbonate-Vitamin D (CALCIUM + D PO) Take 1 tablet by mouth daily.     . furosemide (LASIX) 40 MG tablet Take 40 mg by mouth 2 (two) times daily.    Marland Kitchen gentamicin cream (GARAMYCIN) 0.1 % Apply 1 application topically daily.    . insulin aspart (NOVOLOG) 100 UNIT/ML injection Inject 10-15 Units into the skin 3 (three) times daily with meals. Take none if blood sugar < 150, 151-200 2 units, 201-250 4 units, 251-300 6 units, 301-350 8 units    . Insulin Glargine (LANTUS) 100 UNIT/ML Solostar Pen Inject 45 units into the skin daily. Dx 250.00 15 mL 6  . isosorbide mononitrate (IMDUR) 30 MG 24 hr tablet Take 15 mg by mouth daily.    Marland Kitchen lactulose (CHRONULAC) 10 GM/15ML solution Take 10 g by mouth every 4 (four) hours as needed for mild constipation.    Marland Kitchen levothyroxine (SYNTHROID, LEVOTHROID) 112 MCG tablet LABS/APPOINTMENT OVERDUE, 1 by mouth daily in the morning 30  minutes before breakfast for Thyroid 15 tablet 0  . PERITONEAL DIALYSIS SOLUTIONS IP Inject 2,500 Units into the peritoneum 4 (four) times daily.     . potassium chloride SA (K-DUR,KLOR-CON) 20 MEQ tablet Take 20 mEq by mouth daily.    . rosuvastatin (CRESTOR) 10 MG tablet Take 10 mg by mouth daily.     No current facility-administered medications for this visit.    LABS/IMAGING: No results found for this or any previous visit (from the past 48 hour(s)). No results found.  VITALS: Pulse 99  Ht 5\' 9"  (1.753 m)  Wt 255 lb 14.4 oz (116.075 kg)  BMI 37.77 kg/m2  EXAM: General appearance: alert, morbidly obese and pale Neck: no carotid bruit and no JVD Lungs: diminished breath sounds bilaterally Heart: regular rate and rhythm and Distant heart sounds Abdomen: soft, non-tender; bowel sounds normal; no masses,  no organomegaly and Morbidly obese Extremities: edema Trace to 1+ bilateral edema Pulses: 2+ and symmetric Skin: Pale, warm, dry Neurologic: Mental status: Somewhat slowed mentation Psych: Flat affect  EKG: Sinus rhythm with first-degree AV block and PVCs, right bundle branch block and left anterior fascicular block, unchanged from prior EKG  ASSESSMENT: 1. Multivessel coronary artery disease status post 3 vessel CABG in 2011 - medical therapy recommended at cath 2. Insulin-dependent diabetes 3. Sarcoidosis on prednisone 4. Hypertension 5. Dyslipidemia 6. End-stage renal disease on peritoneal dialysis 7. Obesity 8. Recent fever of unknown origin with leukocytosis 9. Nonproductive cough 10. Ischemic cardiomyopathy EF 45%  PLAN: 1.   Jack Chung has had non-productive cough and recently an elevated white blood cell count by laboratory work this past Friday. He may be developing a early bronchopneumonia. I recommend a chest x-ray and we may need to consider treatment for this. He was also sent for evaluation of possible decompensated heart failure. We know his EF is somewhere  around 45% last year. Plan to repeat an echocardiogram to look for any change in systolic function. I will also repeat a metabolic profile and BNP. Since his blood pressures been running low, I'll decrease his imdur to 15 mg daily. Hopefully this will help him some. I'll see him back in a few weeks.  Pixie Casino, MD, [facility] Attending Cardiologist [place] [facility] 12/30/2014, 3:39 PM

## 2014-12-30 NOTE — Telephone Encounter (Signed)
Reviewed by [name]. He will see patient today in appointment at 2:15pm. Patient notified and instructed to bring all medication for review and to arrive at 2pm to sign-in. Patient verbalized understanding and agreement.

## 2014-12-30 NOTE — Telephone Encounter (Signed)
Pt's wife called in wanting him to be seen by [name] for a cough he has just developed and his BP is running low. She does not feel that he should wait until 8/12 to be seen. Please call  Thanks

## 2014-12-30 NOTE — Patient Instructions (Signed)
Medication Instructions:   DECREASE isosorbide (imdur) from 30mg  to 15mg  daily - to help with low BP  Labwork:  BMP, BNP, CBC - when you have your vancomycin levels drawn  Testing/Procedures:  Chest X-Ray - [address] [facility] Echocardiogram - [address]  Follow-Up:  1 months with [name]  Any Other Special Instructions Will Be Listed Below (If Applicable).

## 2014-12-30 NOTE — Telephone Encounter (Signed)
Patient's wife (DPR) calling to report that patient has been experiencing a "pesky" cough x3-4 months, however over the past few days he has also developed new symptoms of weakness, dizziness, low BP, increased HR and weight gain of 2-3 kgs.  BP today 93/52, HR 98. States he "just does not feel well at all". Denies SOB or CP.

## 2014-12-31 ENCOUNTER — Telehealth: Payer: Self-pay | Admitting: Internal Medicine

## 2014-12-31 NOTE — Telephone Encounter (Signed)
Returning call,she does not know who called.

## 2014-12-31 NOTE — Telephone Encounter (Signed)
Chest xray results reviewed with patient's wife.

## 2015-01-01 NOTE — Addendum Note (Signed)
Addended by: Diana Eves on: 01/01/2015 08:26 AM   Modules accepted: Orders

## 2015-01-08 ENCOUNTER — Ambulatory Visit (HOSPITAL_COMMUNITY): Payer: Medicare Other | Attending: Family Medicine

## 2015-01-08 ENCOUNTER — Other Ambulatory Visit: Payer: Self-pay

## 2015-01-08 DIAGNOSIS — I959 Hypotension, unspecified: Secondary | ICD-10-CM | POA: Diagnosis not present

## 2015-01-08 DIAGNOSIS — I255 Ischemic cardiomyopathy: Secondary | ICD-10-CM

## 2015-01-08 DIAGNOSIS — N186 End stage renal disease: Secondary | ICD-10-CM | POA: Diagnosis not present

## 2015-01-08 DIAGNOSIS — I120 Hypertensive chronic kidney disease with stage 5 chronic kidney disease or end stage renal disease: Secondary | ICD-10-CM | POA: Diagnosis not present

## 2015-01-08 DIAGNOSIS — I371 Nonrheumatic pulmonary valve insufficiency: Secondary | ICD-10-CM | POA: Diagnosis not present

## 2015-01-08 DIAGNOSIS — E785 Hyperlipidemia, unspecified: Secondary | ICD-10-CM | POA: Insufficient documentation

## 2015-01-08 DIAGNOSIS — I340 Nonrheumatic mitral (valve) insufficiency: Secondary | ICD-10-CM | POA: Insufficient documentation

## 2015-01-08 DIAGNOSIS — E119 Type 2 diabetes mellitus without complications: Secondary | ICD-10-CM | POA: Insufficient documentation

## 2015-01-08 DIAGNOSIS — I059 Rheumatic mitral valve disease, unspecified: Secondary | ICD-10-CM | POA: Diagnosis not present

## 2015-01-08 DIAGNOSIS — R0600 Dyspnea, unspecified: Secondary | ICD-10-CM | POA: Diagnosis not present

## 2015-01-30 ENCOUNTER — Ambulatory Visit (INDEPENDENT_AMBULATORY_CARE_PROVIDER_SITE_OTHER): Payer: Medicare Other | Admitting: Internal Medicine

## 2015-01-30 VITALS — Ht 69.0 in | Wt 240.2 lb

## 2015-01-30 DIAGNOSIS — Z951 Presence of aortocoronary bypass graft: Secondary | ICD-10-CM

## 2015-01-30 DIAGNOSIS — I5022 Chronic systolic (congestive) heart failure: Secondary | ICD-10-CM

## 2015-01-30 DIAGNOSIS — Z992 Dependence on renal dialysis: Secondary | ICD-10-CM

## 2015-01-30 DIAGNOSIS — N186 End stage renal disease: Secondary | ICD-10-CM

## 2015-01-30 DIAGNOSIS — D869 Sarcoidosis, unspecified: Secondary | ICD-10-CM

## 2015-01-30 MED ORDER — MIDODRINE HCL 10 MG PO TABS: 10.0000 mg | ORAL_TABLET | Freq: Three times a day (TID) | ORAL | Status: DC

## 2015-01-30 NOTE — Patient Instructions (Addendum)
Your physician has recommended you make the following change in your medication...  1. STOP losartan 2. START midodrine three times daily  Your physician recommends that you schedule a follow-up appointment in [place] with Erasmo Downer for BP check   Your physician recommends that you schedule a follow-up appointment with [name] at next available.

## 2015-01-31 ENCOUNTER — Telehealth: Payer: Self-pay | Admitting: Internal Medicine

## 2015-01-31 ENCOUNTER — Encounter: Payer: Self-pay | Admitting: Internal Medicine

## 2015-01-31 NOTE — Telephone Encounter (Signed)
Good news .Marland Kitchen He seems to be responding. Thanks - can take morning, mid-day and at night before bed. Does he feel more energy?  Dr. Lemmie Evens

## 2015-01-31 NOTE — Telephone Encounter (Signed)
Spoke with patient's wife regarding BP readings.   01/30/15 @ 530pm  BP 77/47  HR 88 01/30/15 @ 1030pm  BP 92/43  HR 91 01/31/15 @ 230am  BP 77/48  HR 81 (wife reports patient didn't feel well) 01/31/15 @ 830am  BP 106/48  HR 86 01/31/15 @ 130pm  BP 104/50  HR 77  Patient has had 2 doses of midodrine.  Advised to take midodrine as directed as BP appears to be improving.  Advised to adjust times of TID dosing a little bit so that patient will not have to wake up at 230am to take med.  Wife voiced understanding.

## 2015-01-31 NOTE — Telephone Encounter (Signed)
Pt is calling in to give the nurse some BP readings. Please call  Thanks

## 2015-01-31 NOTE — Progress Notes (Signed)
OFFICE NOTE  Chief Complaint:  Low blood pressure, follow-up echo  Primary Care Physician: [name], MD  HPI:  SHAY BARTOLI is a 74 year old male who was referred to me for to establish cardiac care. 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 In 2011 apparently he was working on putting a roof on his house and came in later in the day. His wife found him apparently sitting by the side of his bed and he was blue, confused and short of breath. She called 911 and was taken to the emergency room. Ultimately his EKG demonstrated probably acute MI and he underwent cardiac catheterization which showed multivessel coronary artery disease. At that time there were no clear areas that would be amenable to percutaneous intervention. A surgical consult was obtained and per his report 3 different surgeons would not operate on him but eventually Dr. Roxanna Mew did.  According to his operative report there was significant multivessel coronary disease. The LAD was heavily calcified throughout its length and closed 95% proximally. The mid and distal portion of the vessel had a lumen diameter 1.25 mm and were heavily calcified. The circumflex obtuse marginal branch was patent. The posterior lateral branch was a 2 mm vessel. The right coronary artery was totally occluded. The posterior descending artery was also a very small vessel about 1.5-2 mm. He underwent a three-vessel bypass and did fairly well although had a difficult recovery. 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  He was recently hospitalized for fever of unknown origin. He underwent an echocardiogram which showed an EF of 50-55% with grade 2 diastolic dysfunction. Since that time he said progressive shortness of breath but denies any chest discomfort. He  said he never had any chest pain even prior to his episode where he presented for bypass surgery. Angina may not be a reliable marker this gentleman.  Mr. Teo returns for followup. In the interim he underwent nuclear stress test which was intermediate and show partial reversibility concerning for ischemia. He was ultimately referred for cardiac catheterization which was performed by Dr. Claiborne Billings. This demonstrated the following findings:  IMPRESSION:  Severe native coronary artery disease with evidence for coronary calcification and 95% proximal LAD stenosis between the first and second diagonal vessel with subtotal LAD occlusion beyond the second diagonal vessel; subtotal occlusion of the native circumflex vessel proximally with faint filling of a marginal branch prior to total mid occlusion, and total occlusion of the mid RCA. Patent LIMA graft supplying the mid LAD with diffuse 80% mid LAD stenoses and a small caliber vessel and 95% apical LAD stenosis. Sequential vein graft supplying the PDA branch of the RCA and a distal marginal-like branch of the left circumflex coronary artery.  Medical therapy was recommended. He was started on a nitrate and his symptoms of chest pressure have improved.  Mr. Millan returns today for follow-up. He was an add-on for persistent cough. Apparently this is been going on for couple of months. He saw his primary care provider who recommended cetirizine. He recently has had a cough which is nonproductive and feels like it's hanging in his chest. Blood pressure is also been low on dialysis days. There is concern for worsening heart failure from his wife who noted that he's had some more swelling in his legs. His weight is stated virtually stable but  she feels that he's eating last and therefore may be gaining equivalent fluid weight. Recently he's had an increase in the ultrafiltration with dialysis to try to remove more fluid. They tell me that he also was noted to have  possible peritonitis and is on vancomycin for an elevated white blood cell count of 13,000. That being said, she felt that her nephrologist did not feel this was SBP but another source of infection.  Mr. Candela returns today for follow-up. He reports his cough has mostly resolved. He's been having problems with very low blood pressure at home with peritoneal dialysis. He's been taken off of almost all of his medications except for losartan 25 mg. We were unable to obtain a blood pressure in the office today however he was sitting up and conversive. He recently had a repeat echo which shows normal systolic function and a small area of inferior hypokinesis. The etiology of his hypo-tension is not clear, but will necessitate further treatment.  PMHx:  Past Medical History  Diagnosis Date  . Hypertension   . Diabetes mellitus   . Hypothyroidism   . CHF (congestive heart failure)   . Anemia   . Blind left eye   . Sleep apnea     not on CPAP  . Coronary artery disease     CABG x 11 Jun 2009.  MRSA infections of incsions  . Arthritis   . Sarcoidosis     Hx:of  . Diabetic retinopathy     Hx: of bilateral  . Edema   . Hyperlipidemia   . 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 Atherosclerosis of native arteries of the extremities, unspecified   . Eczema     Hx: of  . Insomnia   . Seasonal allergies   . ESRD on dialysis     Was on dialysis in July 2011 and then stopped and restarted May 2014. He then transitioned to PD in Aug 2014. He does PD on cycler when at home and CAPD 6 exchanges per day when travelling. His wife is "in charge" and does the exchanges.   . History of blood transfusion     with heart surgery  . Coronary atherosclerosis of native coronary artery   . Atherosclerosis of native arteries of the extremities, unspecified     Past Surgical History  Procedure Laterality Date  . Cardiac surgery      total of 6 surgeries, 5 related to mrsa  . Coronary  artery bypass graft  06/2009  . Cataract surgery      Hx: of  . Debridements      Hx: of secondary to MRSA  . Av fistula placement Right 10/11/2012    Procedure: ARTERIOVENOUS (AV) FISTULA CREATION;  Surgeon: Rosetta Posner, MD;  Location: [facility];  Service: Vascular;  Laterality: Right;  . Insertion of dialysis catheter Left   . Capd insertion N/A 11/28/2012    Procedure: LAPAROSCOPIC PERITONEAL DIALYSIS CATHETER PLACEMENT;  Surgeon: Ralene Ok, MD;  Location: [facility];  Service: General;  Laterality: N/A;  . Left heart catheterization with coronary/graft angiogram N/A 01/31/2014    Procedure: LEFT HEART CATHETERIZATION WITH Beatrix Fetters;  Surgeon: Troy Sine, MD;  Location: [facility] CATH LAB;  Service: Cardiovascular;  Laterality: N/A;    FAMHx:  Family History  Problem Relation Age of Onset  . COPD Mother   . COPD Father     SOCHx:   reports that he has never smoked. He has  never used smokeless tobacco. He reports that he does not drink alcohol or use illicit drugs.  ALLERGIES:  Allergies  Allergen Reactions  . Flu Virus Vaccine     Gets the flu, patient has bad reaction to the flu vaccine.  . Lipitor [Atorvastatin]     Leg cramps    ROS: A comprehensive review of systems was negative except for: Constitutional: positive for fatigue Cardiovascular: positive for dyspnea and lower extremity edema  HOME MEDS: Current Outpatient Prescriptions  Medication Sig Dispense Refill  . aspirin 325 MG tablet Take 325 mg by mouth daily.    . calcium carbonate (TUMS - DOSED IN MG ELEMENTAL CALCIUM) 500 MG chewable tablet Chew 2 tablets by mouth 3 (three) times daily with meals.     . Calcium Carbonate-Vitamin D (CALCIUM + D PO) Take 1 tablet by mouth daily.     Marland Kitchen Dextromethorphan Polistirex (DELSYM PO) Take 10 mg by mouth 2 (two) times daily.    . fexofenadine (ALLEGRA) 30 MG tablet Take 30 mg by mouth daily.    . furosemide (LASIX) 40 MG tablet Take 40 mg by mouth 2 (two) times  daily.    Marland Kitchen gentamicin cream (GARAMYCIN) 0.1 % Apply 1 application topically daily.    . insulin aspart (NOVOLOG) 100 UNIT/ML injection Inject 10-15 Units into the skin 3 (three) times daily with meals. Take none if blood sugar < 150, 151-200 2 units, 201-250 4 units, 251-300 6 units, 301-350 8 units    . Insulin Glargine (LANTUS) 100 UNIT/ML Solostar Pen Inject 45 units into the skin daily. Dx 250.00 15 mL 6  . lactulose (CHRONULAC) 10 GM/15ML solution Take 10 g by mouth every 4 (four) hours as needed for mild constipation.    Marland Kitchen levothyroxine (SYNTHROID, LEVOTHROID) 112 MCG tablet LABS/APPOINTMENT OVERDUE, 1 by mouth daily in the morning 30 minutes before breakfast for Thyroid 15 tablet 0  . metoCLOPramide (REGLAN) 5 MG tablet Take 5 mg by mouth every 8 (eight) hours as needed for nausea.    . Multiple Vitamin (MULTI VITAMIN DAILY PO) Take 1 tablet by mouth daily.    Marland Kitchen omeprazole (PRILOSEC) 10 MG capsule Take 10 mg by mouth daily.    Marland Kitchen PERITONEAL DIALYSIS SOLUTIONS IP Inject 2,500 Units into the peritoneum 4 (four) times daily.     . potassium chloride SA (K-DUR,KLOR-CON) 20 MEQ tablet Take 20 mEq by mouth daily.    . midodrine (PROAMATINE) 10 MG tablet Take 1 tablet (10 mg total) by mouth 3 (three) times daily. 90 tablet 3   No current facility-administered medications for this visit.    LABS/IMAGING: No results found for this or any previous visit (from the past 48 hour(s)). No results found.  VITALS: Ht 5\' 9"  (1.753 m)  Wt 240 lb 3.2 oz (108.954 kg)  BMI 35.46 kg/m2  EXAM: Deferred  EKG: Deferred  ASSESSMENT: 1. Multivessel coronary artery disease status post 3 vessel CABG in 2011 - medical therapy recommended at cath 2. Insulin-dependent diabetes 3. Sarcoidosis on prednisone 4. Hypertension 5. Dyslipidemia 6. End-stage renal disease on peritoneal dialysis 7. Obesity 8. Recent fever of unknown origin with leukocytosis 9. Nonproductive cough 10. Ischemic cardiomyopathy EF  45% -> now improved to 55-60%  11.  unexplained hypotension  PLAN: 1.   Mr. Kisling has had progressive hypotension with difficulty in maintaining blood pressure at dialysis. He undergoes daily peritoneal dialysis so is difficult to say whether it's related to fluid removal. He's been taken off of almost  all of his blood pressure medicines and I will recommend discontinuing his losartan today. We'll start him on midodrine 10 mg 3 times a day to see if this helps improve his blood pressures. I'll schedule follow-up appointment with our hypertension pharmacist Kristen to see if she can get a blood pressure on him within the next week. If the systolic blood pressure is now above 100, consideration could be given to increasing his midodrine to 20 mg 3 times a day. Another possibility of difficult to obtain blood pressures could be PAD in the arms. We may need to consider upper extremity Dopplers since his pulse is difficult to palpate.   Pixie Casino, MD, [facility] Attending Cardiologist [place] 01/31/2015, 10:53 AM

## 2015-02-03 NOTE — Telephone Encounter (Signed)
LM for patient to check in on how his BP is and how he is doing.

## 2015-02-04 ENCOUNTER — Telehealth: Payer: Self-pay | Admitting: Internal Medicine

## 2015-02-04 NOTE — Telephone Encounter (Signed)
Spoke with wife She states the patient's blood pressure has improved - 95-108/50-55 She states since he started taking the medication -he has had the hiccups for 7 hours without relief.  RN informed wife not aware that is a side effect from MIDODRINE,BUT WILL DEFER TO DR HILTY and pharmacist.

## 2015-02-04 NOTE — Telephone Encounter (Signed)
Jack Chung with answering service in regards to Jack Chung medication change , blood pressure is ok , developed hiccups since 01/30/15.Marland Kitchen Please call   Thanks

## 2015-02-04 NOTE — Telephone Encounter (Signed)
Don't see any information linking to midodrine.  His metoclopramide can cause laryngospasm, but nothing else looks significant.  One potential cause of hiccups is electrolyte imbalance, but his labs indicate that is not a new thing.

## 2015-02-06 ENCOUNTER — Encounter: Payer: Self-pay | Admitting: Pharmacist Clinician (PhC)/ Clinical Pharmacy Specialist

## 2015-02-06 ENCOUNTER — Ambulatory Visit (INDEPENDENT_AMBULATORY_CARE_PROVIDER_SITE_OTHER): Payer: Medicare Other | Admitting: Pharmacist Clinician (PhC)/ Clinical Pharmacy Specialist

## 2015-02-06 VITALS — BP 120/62 | HR 92 | Ht 69.0 in | Wt 238.5 lb

## 2015-02-06 DIAGNOSIS — I951 Orthostatic hypotension: Secondary | ICD-10-CM

## 2015-02-06 NOTE — Progress Notes (Signed)
02/06/2015 Jack Chung 1941/05/29 [id number]   HPI:  Jack Chung is a 74 y.o. male patient of [name], with a PMH below who presents today for a blood pressure check and evaluation.   Patient had been hypertensive for several years, until this past summer when his BP began to drop.  He previously was on losartan, metoprolol and isosorbide, all of which were discontinued as his pressure began to fall.  Because of home peritoneal dialysis, his wife checks his BP four times each day.  Most of the readings were < 100 and some as low as 71I systolic.  Jack Chung reports that he does not experience dizziness or lightheadedness when his pressure drops that low, his only symptom is weakness and being more prone to falls.  This usually occurs if his systolic is < 80.   When he saw [name] last week, they were not able to get a pulse or blood pressure reading on him.  [name] started him on midodrine 10 mg three times daily.  Within 2-3 days his pressure started to come up, and in the past 4 days only 2 of the 16 readings were <458 systolic.  Of note, he reports developing hiccups after starting the midodrine, lasting around the clock and have finally gone away in the last 24 hours.  Cardiac Hx: IDDM, hyperlipidemia, ESRD w/ peritoneal dialysis, MI in 2011,   Social Hx: no tobacco or alcohol; occasional cup of coffee  Diet: follows strict diet for dialysis, no added salt, although they do eat about 1/2 of their meals in restaurants  Exercise: none; weakness in legs with difficulty standing for long periods  Home BP readings: have risen since starting midodrine, now most readings above 100, highest 099 systolic.    Current antihypertensive medications: midodrine 10 mg tid (wife giving q8hrs, waking in middle of night for 3rd dose)   Current Outpatient Prescriptions  Medication Sig Dispense Refill  . aspirin 325 MG tablet Take 325 mg by mouth daily.    . calcium carbonate (TUMS - DOSED IN MG  ELEMENTAL CALCIUM) 500 MG chewable tablet Chew 2 tablets by mouth 3 (three) times daily with meals.     . Calcium Carbonate-Vitamin D (CALCIUM + D PO) Take 1 tablet by mouth daily.     Marland Kitchen Dextromethorphan Polistirex (DELSYM PO) Take 10 mg by mouth 2 (two) times daily.    . fexofenadine (ALLEGRA) 30 MG tablet Take 30 mg by mouth daily.    . furosemide (LASIX) 40 MG tablet Take 40 mg by mouth 2 (two) times daily.    Marland Kitchen gentamicin cream (GARAMYCIN) 0.1 % Apply 1 application topically daily.    . insulin aspart (NOVOLOG) 100 UNIT/ML injection Inject 10-15 Units into the skin 3 (three) times daily with meals. Take none if blood sugar < 150, 151-200 2 units, 201-250 4 units, 251-300 6 units, 301-350 8 units    . Insulin Glargine (LANTUS) 100 UNIT/ML Solostar Pen Inject 45 units into the skin daily. Dx 250.00 15 mL 6  . lactulose (CHRONULAC) 10 GM/15ML solution Take 10 g by mouth every 4 (four) hours as needed for mild constipation.    Marland Kitchen levothyroxine (SYNTHROID, LEVOTHROID) 112 MCG tablet LABS/APPOINTMENT OVERDUE, 1 by mouth daily in the morning 30 minutes before breakfast for Thyroid 15 tablet 0  . metoCLOPramide (REGLAN) 5 MG tablet Take 5 mg by mouth every 8 (eight) hours as needed for nausea.    . midodrine (PROAMATINE)  10 MG tablet Take 1 tablet (10 mg total) by mouth 3 (three) times daily. 90 tablet 3  . Multiple Vitamin (MULTI VITAMIN DAILY PO) Take 1 tablet by mouth daily.    Marland Kitchen omeprazole (PRILOSEC) 10 MG capsule Take 10 mg by mouth daily.    Marland Kitchen PERITONEAL DIALYSIS SOLUTIONS IP Inject 2,500 Units into the peritoneum 4 (four) times daily.     . potassium chloride SA (K-DUR,KLOR-CON) 20 MEQ tablet Take 20 mEq by mouth daily.     No current facility-administered medications for this visit.    Allergies  Allergen Reactions  . Flu Virus Vaccine     Gets the flu, patient has bad reaction to the flu vaccine.  . Lipitor [Atorvastatin]     Leg cramps    Past Medical History  Diagnosis Date  .  Hypertension   . Diabetes mellitus   . Hypothyroidism   . CHF (congestive heart failure)   . Anemia   . Blind left eye   . Sleep apnea     not on CPAP  . Coronary artery disease     CABG x 11 Jun 2009.  MRSA infections of incsions  . Arthritis   . Sarcoidosis     Hx:of  . Diabetic retinopathy     Hx: of bilateral  . Edema   . Hyperlipidemia   . 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 Atherosclerosis of native arteries of the extremities, unspecified   . Eczema     Hx: of  . Insomnia   . Seasonal allergies   . ESRD on dialysis     Was on dialysis in July 2011 and then stopped and restarted May 2014. He then transitioned to PD in Aug 2014. He does PD on cycler when at home and CAPD 6 exchanges per day when travelling. His wife is "in charge" and does the exchanges.   . History of blood transfusion     with heart surgery  . Coronary atherosclerosis of native coronary artery   . Atherosclerosis of native arteries of the extremities, unspecified     Blood pressure 120/62, pulse 92, height 5\' 9"  (1.753 m), weight 238 lb 8 oz (108.183 kg).    Jack Chung PharmD CPP [facility]

## 2015-02-06 NOTE — Patient Instructions (Signed)
  Your blood pressure today is 120/62  Continue to check your blood pressure at home daily and keep record of the readings.  Take your BP meds as follows:  Continue with midodrine 10 mg three times daily - take at 8am, 1pm and   Fax your blood pressure readings to [facility] [place]: . Rest 5 minutes before taking your blood pressure. .  Don't smoke or drink caffeinated beverages for at least 30 minutes before. . Take your blood pressure before (not after) you eat. . Sit comfortably with your back supported and both feet on the floor (don't cross your legs). . Elevate your arm to heart level on a table or a desk. . Use the proper sized cuff. It should fit smoothly and snugly around your bare upper arm. There should be enough room to slip a fingertip under the cuff. The bottom edge of the cuff should be 1 inch above the crease of the elbow. . Ideally, take 3 measurements at one sitting and record the average.

## 2015-02-06 NOTE — Assessment & Plan Note (Signed)
Today his BP is much improved from previous home readings.  He brings his BP cuff into the office and it is within 5 points of the office cuff.  I have suggested they adjust the midodrine doses to 8am, 1 pm and 6pm, rather than wake him in the middle of the night for a dose.  He notes the hiccups have stopped and interestingly enough, he just started on metoclopramide in the last 2 days.  Metoclopramide is one medication often used to try and stop persistent hiccups.  His wife will fax me a copy of his weekly BP readings next week.  If he can maintain a BP between 100-120, we will leave his medication as is.

## 2015-02-11 NOTE — Telephone Encounter (Signed)
I'm not aware of this as a side effect - may things can cause diaphragmatic irritation and hiccoughs. Would keep taking if it is working.  Dr. Lemmie Evens

## 2015-02-12 NOTE — Telephone Encounter (Signed)
Spoke with wife. Symptoms were discussed on 9/1 with Kristin at BP check appmt. No further assistance necessary

## 2015-03-11 ENCOUNTER — Ambulatory Visit (INDEPENDENT_AMBULATORY_CARE_PROVIDER_SITE_OTHER): Payer: Medicare Other | Admitting: Internal Medicine

## 2015-03-11 ENCOUNTER — Encounter: Payer: Self-pay | Admitting: Internal Medicine

## 2015-03-11 VITALS — BP 134/72 | HR 86 | Ht 69.0 in | Wt 237.9 lb

## 2015-03-11 DIAGNOSIS — I951 Orthostatic hypotension: Secondary | ICD-10-CM | POA: Diagnosis not present

## 2015-03-11 DIAGNOSIS — Z951 Presence of aortocoronary bypass graft: Secondary | ICD-10-CM

## 2015-03-11 DIAGNOSIS — N186 End stage renal disease: Secondary | ICD-10-CM | POA: Diagnosis not present

## 2015-03-11 DIAGNOSIS — I2583 Coronary atherosclerosis due to lipid rich plaque: Secondary | ICD-10-CM

## 2015-03-11 DIAGNOSIS — Z992 Dependence on renal dialysis: Secondary | ICD-10-CM

## 2015-03-11 DIAGNOSIS — G4730 Sleep apnea, unspecified: Secondary | ICD-10-CM | POA: Diagnosis not present

## 2015-03-11 DIAGNOSIS — I2510 Atherosclerotic heart disease of native coronary artery without angina pectoris: Secondary | ICD-10-CM

## 2015-03-11 MED ORDER — MIDODRINE HCL 10 MG PO TABS: 10.0000 mg | ORAL_TABLET | Freq: Three times a day (TID) | ORAL | Status: DC

## 2015-03-11 NOTE — Patient Instructions (Signed)
Your physician wants you to follow-up in: 6 months with Dr. Hilty. You will receive a reminder letter in the mail two months in advance. If you don't receive a letter, please call our office to schedule the follow-up appointment.    

## 2015-03-12 NOTE — Progress Notes (Signed)
OFFICE NOTE  Chief Complaint:  Follow-up blood pressure  Primary Care Physician: [name], MD  HPI:  Jack Chung is a 74 year old male who was referred to me for to establish cardiac care. 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 In 2011 apparently he was working on putting a roof on his house and came in later in the day. His wife found him apparently sitting by the side of his bed and he was blue, confused and short of breath. She called 911 and was taken to the emergency room. Ultimately his EKG demonstrated probably acute MI and he underwent cardiac catheterization which showed multivessel coronary artery disease. At that time there were no clear areas that would be amenable to percutaneous intervention. A surgical consult was obtained and per his report 3 different surgeons would not operate on him but eventually Dr. Roxanna Mew did.  According to his operative report there was significant multivessel coronary disease. The LAD was heavily calcified throughout its length and closed 95% proximally. The mid and distal portion of the vessel had a lumen diameter 1.25 mm and were heavily calcified. The circumflex obtuse marginal branch was patent. The posterior lateral branch was a 2 mm vessel. The right coronary artery was totally occluded. The posterior descending artery was also a very small vessel about 1.5-2 mm. He underwent a three-vessel bypass and did fairly well although had a difficult recovery. 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  He was recently hospitalized for fever of unknown origin. He underwent an echocardiogram which showed an EF of 50-55% with grade 2 diastolic dysfunction. Since that time he said progressive shortness of breath but denies any chest discomfort. He said he  never had any chest pain even prior to his episode where he presented for bypass surgery. Angina may not be a reliable marker this gentleman.  Jack Chung returns for followup. In the interim he underwent nuclear stress test which was intermediate and show partial reversibility concerning for ischemia. He was ultimately referred for cardiac catheterization which was performed by Dr. Claiborne Billings. This demonstrated the following findings:  IMPRESSION:  Severe native coronary artery disease with evidence for coronary calcification and 95% proximal LAD stenosis between the first and second diagonal vessel with subtotal LAD occlusion beyond the second diagonal vessel; subtotal occlusion of the native circumflex vessel proximally with faint filling of a marginal branch prior to total mid occlusion, and total occlusion of the mid RCA. Patent LIMA graft supplying the mid LAD with diffuse 80% mid LAD stenoses and a small caliber vessel and 95% apical LAD stenosis. Sequential vein graft supplying the PDA branch of the RCA and a distal marginal-like branch of the left circumflex coronary artery.  Medical therapy was recommended. He was started on a nitrate and his symptoms of chest pressure have improved.  Jack Chung returns today for follow-up. He was an add-on for persistent cough. Apparently this is been going on for couple of months. He saw his primary care provider who recommended cetirizine. He recently has had a cough which is nonproductive and feels like it's hanging in his chest. Blood pressure is also been low on dialysis days. There is concern for worsening heart failure from his wife who noted that he's had some more swelling in his legs. His weight is stated virtually stable but she feels  that he's eating last and therefore may be gaining equivalent fluid weight. Recently he's had an increase in the ultrafiltration with dialysis to try to remove more fluid. They tell me that he also was noted to have possible  peritonitis and is on vancomycin for an elevated white blood cell count of 13,000. That being said, she felt that her nephrologist did not feel this was SBP but another source of infection.  Jack Chung returns today for follow-up. He reports his cough has mostly resolved. He's been having problems with very low blood pressure at home with peritoneal dialysis. He's been taken off of almost all of his medications except for losartan 25 mg. We were unable to obtain a blood pressure in the office today however he was sitting up and conversive. He recently had a repeat echo which shows normal systolic function and a small area of inferior hypokinesis. The etiology of his hypo-tension is not clear, but will necessitate further treatment.  Saw Jack Chung back in the office today. At his last office visit I started him on midodrine 10 mg 3 times a day. This is been exceptionally helpful for his low blood pressures. He continues with home peritoneal dialysis and a blood pressures generally range between 811 and 9:14 systolic based on his flow sheets. Overall he feels better. He was also noted to be significantly anemic and is since received iron infusions and is breathing somewhat better with improvement in his hematocrit. He denies any syncope or presyncope.  PMHx:  Past Medical History  Diagnosis Date  . Hypertension   . Diabetes mellitus   . Hypothyroidism   . CHF (congestive heart failure) ([place])   . Anemia   . Blind left eye   . Sleep apnea     not on CPAP  . Coronary artery disease     CABG x 11 Jun 2009.  MRSA infections of incsions  . Arthritis   . Sarcoidosis ([place])     Hx:of  . Diabetic retinopathy ([place])     Hx: of bilateral  . Edema   . Hyperlipidemia   . 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 Atherosclerosis of native arteries of the extremities, unspecified   . Eczema     Hx: of  . Insomnia   . Seasonal allergies   . ESRD on dialysis [facility])     Was on dialysis  in July 2011 and then stopped and restarted May 2014. He then transitioned to PD in Aug 2014. He does PD on cycler when at home and CAPD 6 exchanges per day when travelling. His wife is "in charge" and does the exchanges.   . History of blood transfusion     with heart surgery  . Coronary atherosclerosis of native coronary artery   . Atherosclerosis of native arteries of the extremities, unspecified     Past Surgical History  Procedure Laterality Date  . Cardiac surgery      total of 6 surgeries, 5 related to mrsa  . Coronary artery bypass graft  06/2009  . Cataract surgery      Hx: of  . Debridements      Hx: of secondary to MRSA  . Av fistula placement Right 10/11/2012    Procedure: ARTERIOVENOUS (AV) FISTULA CREATION;  Surgeon: Rosetta Posner, MD;  Location: [place];  Service: Vascular;  Laterality: Right;  . Insertion of dialysis catheter Left   . Capd insertion N/A 11/28/2012    Procedure: LAPAROSCOPIC  PERITONEAL DIALYSIS CATHETER PLACEMENT;  Surgeon: Ralene Ok, MD;  Location: [place];  Service: General;  Laterality: N/A;  . Left heart catheterization with coronary/graft angiogram N/A 01/31/2014    Procedure: LEFT HEART CATHETERIZATION WITH Beatrix Fetters;  Surgeon: Troy Sine, MD;  Location: [facility] CATH LAB;  Service: Cardiovascular;  Laterality: N/A;    FAMHx:  Family History  Problem Relation Age of Onset  . COPD Mother   . COPD Father     SOCHx:   reports that he has never smoked. He has never used smokeless tobacco. He reports that he does not drink alcohol or use illicit drugs.  ALLERGIES:  Allergies  Allergen Reactions  . Flu Virus Vaccine     Gets the flu, patient has bad reaction to the flu vaccine.  . Lipitor [Atorvastatin]     Leg cramps    ROS: A comprehensive review of systems was negative except for: Constitutional: positive for fatigue Respiratory: positive for dyspnea on exertion  HOME MEDS: Current Outpatient Prescriptions  Medication Sig  Dispense Refill  . aspirin 325 MG tablet Take 325 mg by mouth daily.    . calcium carbonate (TUMS - DOSED IN MG ELEMENTAL CALCIUM) 500 MG chewable tablet Chew 2 tablets by mouth 3 (three) times daily with meals.     . Calcium Carbonate-Vitamin D (CALCIUM + D PO) Take 1 tablet by mouth daily.     Marland Kitchen Dextromethorphan Polistirex (DELSYM PO) Take 10 mg by mouth 2 (two) times daily.    . fexofenadine (ALLEGRA) 30 MG tablet Take 30 mg by mouth daily.    . furosemide (LASIX) 40 MG tablet Take 40 mg by mouth 2 (two) times daily.    Marland Kitchen gentamicin cream (GARAMYCIN) 0.1 % Apply 1 application topically daily.    . insulin aspart (NOVOLOG) 100 UNIT/ML injection Inject 10-15 Units into the skin 3 (three) times daily with meals. Take none if blood sugar < 150, 151-200 2 units, 201-250 4 units, 251-300 6 units, 301-350 8 units    . Insulin Glargine (LANTUS) 100 UNIT/ML Solostar Pen Inject 45 units into the skin daily. Dx 250.00 15 mL 6  . lactulose (CHRONULAC) 10 GM/15ML solution Take 10 g by mouth every 4 (four) hours as needed for mild constipation.    Marland Kitchen levothyroxine (SYNTHROID, LEVOTHROID) 112 MCG tablet LABS/APPOINTMENT OVERDUE, 1 by mouth daily in the morning 30 minutes before breakfast for Thyroid 15 tablet 0  . metoCLOPramide (REGLAN) 5 MG tablet Take 5 mg by mouth every 8 (eight) hours as needed for nausea.    . midodrine (PROAMATINE) 10 MG tablet Take 1 tablet (10 mg total) by mouth 3 (three) times daily. 270 tablet 3  . Multiple Vitamin (MULTI VITAMIN DAILY PO) Take 1 tablet by mouth daily.    Marland Kitchen omeprazole (PRILOSEC) 10 MG capsule Take 10 mg by mouth daily.    Marland Kitchen PERITONEAL DIALYSIS SOLUTIONS IP Inject 2,500 Units into the peritoneum 4 (four) times daily.     . potassium chloride SA (K-DUR,KLOR-CON) 20 MEQ tablet Take 20 mEq by mouth daily.     No current facility-administered medications for this visit.    LABS/IMAGING: No results found for this or any previous visit (from the past 48 hour(s)). No  results found.  VITALS: BP 134/72 mmHg  Pulse 86  Ht 5\' 9"  (1.753 m)  Wt 237 lb 14.4 oz (107.911 kg)  BMI 35.12 kg/m2  EXAM: General appearance: alert and no distress Lungs: clear to auscultation bilaterally Heart: regular  rate and rhythm, S1, S2 normal, no murmur, click, rub or gallop Extremities: extremities normal, atraumatic, no cyanosis or edema  EKG: Deferred  ASSESSMENT: 1. Multivessel coronary artery disease status post 3 vessel CABG in 2011 - medical therapy recommended at cath 2. Insulin-dependent diabetes 3. Sarcoidosis on prednisone 4. Hypertension 5. Dyslipidemia 6. End-stage renal disease on peritoneal dialysis 7. Obesity 8. Recent fever of unknown origin with leukocytosis 9. Nonproductive cough 10. Ischemic cardiomyopathy EF 45% -> now improved to 55-60%  11. Orthostatic hypotension-improved on midodrine  PLAN: 1.   Jack Chung has had marked improvement in orthostatic hypotension on midodrine. We should continue this as he continues his dialysis. EF is improved to 55-60%, there is no evidence that any of his remaining shortness of breath is related a cardiac process. He was markedly anemic and that has improved as well with some improvement in energy level. We will continue his current medical regimen and plan to see him back in 6 months.  Pixie Casino, MD, [facility] Attending Cardiologist [place] Hilty 03/12/2015, 8:05 AM

## 2015-04-17 ENCOUNTER — Other Ambulatory Visit: Payer: Self-pay | Admitting: Physician Assistant

## 2015-04-17 DIAGNOSIS — R112 Nausea with vomiting, unspecified: Secondary | ICD-10-CM

## 2015-04-21 ENCOUNTER — Ambulatory Visit
Admission: RE | Admit: 2015-04-21 | Discharge: 2015-04-21 | Disposition: A | Discharge status: Home / Self Care | Payer: Medicare Other | Source: Ambulatory Visit | Attending: Physician Assistant | Admitting: Physician Assistant

## 2015-04-21 DIAGNOSIS — R112 Nausea with vomiting, unspecified: Secondary | ICD-10-CM

## 2015-04-22 ENCOUNTER — Ambulatory Visit (INDEPENDENT_AMBULATORY_CARE_PROVIDER_SITE_OTHER): Payer: Medicare Other

## 2015-04-22 ENCOUNTER — Encounter: Payer: Self-pay | Admitting: Podiatry

## 2015-04-22 ENCOUNTER — Ambulatory Visit (INDEPENDENT_AMBULATORY_CARE_PROVIDER_SITE_OTHER): Payer: Medicare Other | Admitting: Podiatry

## 2015-04-22 VITALS — BP 182/86 | HR 69 | Resp 12

## 2015-04-22 DIAGNOSIS — R0989 Other specified symptoms and signs involving the circulatory and respiratory systems: Secondary | ICD-10-CM | POA: Diagnosis not present

## 2015-04-22 DIAGNOSIS — I96 Gangrene, not elsewhere classified: Secondary | ICD-10-CM | POA: Diagnosis not present

## 2015-04-22 DIAGNOSIS — R52 Pain, unspecified: Secondary | ICD-10-CM

## 2015-04-22 DIAGNOSIS — L03032 Cellulitis of left toe: Secondary | ICD-10-CM | POA: Diagnosis not present

## 2015-04-22 MED ORDER — CIPROFLOXACIN HCL 500 MG PO TABS: 500.0000 mg | ORAL_TABLET | Freq: Every day | ORAL | Status: DC

## 2015-04-22 MED ORDER — CLINDAMYCIN HCL 300 MG PO CAPS: 300.0000 mg | ORAL_CAPSULE | Freq: Three times a day (TID) | ORAL | Status: DC

## 2015-04-22 MED ORDER — SILVER SULFADIAZINE 1 % EX CREA: 1.0000 "application " | TOPICAL_CREAM | Freq: Every day | CUTANEOUS | Status: DC

## 2015-04-22 NOTE — Progress Notes (Signed)
   Subjective:    Patient ID: Jack Chung, male    DOB: June 29, 1940, 74 y.o.   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 His wife states that approximately one month ago it started with a black spot at the end of his left toe that she has been treating. However she also had a stroke and was no longer caring for her husband. When she was able to do so but she noticed that the toes become swollen, red has become more dark in color and having some drainage and swelling as well as odor. He currently denies any antibiotic use except for using antibiotic cream overlying the wound. He denies any systemic complaints such as fevers, chills, nausea, vomiting. No calf pain, chest pain, shortness of breath  Review of Systems  Musculoskeletal: Positive for joint swelling and gait problem.       Objective:   Physical Exam General: AAO x3, NAD  Dermatological: At the distal aspect left hallux is an eschar with a rim of granulation tissue. There is edema to the hallux as well as erythema extending from the distal aspect of the hallux at the level the MPJ. There is no ascending cellulitis. There is no fluctuance or crepitus. There is mild malodor. There is no probing to bone, undermining, tunneling. On the right foot there are small scabs present on the dorsal aspect of all the toes but any surgery erythema, drainage or other signs of infection.  Vascular: DP/PT pulses 1/4 bilaterally. There is no capillary refill time to the left hallux however is intact to the other digits. There is no pain with calf compression, swelling, warmth, erythema.   Neruologic: Sensation decreased with Zelphia [place], decreased vibratory sensation.  Musculoskeletal: No gross boney pedal deformities bilateral. No pain, crepitus, or limitation noted with foot and ankle range of motion bilateral. Muscular strength 5/5 in all  groups tested bilateral.  Gait: Unassisted, Nonantalgic.      Assessment & Plan:  74 year old male with left hallux infection, gangrene; PVD; pre-ulcerative lesions right foot -Treatment options discussed including all alternatives, risks, and complications -X-rays were obtained and reviewed with the patient.  -Etiology of symptoms were discussed -Wound was lightly debrided to the left hallux. -Will start antibiotics. Start clindamycin and ciprofloxacin. -Daily dressing changes. Prescribed Silvadene. -I discussed with him that he is at high likelihood of amputation -Will order vascular studies -Dispensed surgical shoe. -There is any signs or symptoms of worsening infection to go directly to the emergency room. -Follow-up in 1 week or sooner if any problems arise. In the meantime, encouraged to call the office with any questions, concerns, change in symptoms.   Celesta Gentile, DPM

## 2015-04-23 ENCOUNTER — Encounter (HOSPITAL_COMMUNITY): Payer: Self-pay | Admitting: Cardiology

## 2015-04-23 ENCOUNTER — Inpatient Hospital Stay (HOSPITAL_COMMUNITY): Payer: Medicare Other

## 2015-04-23 ENCOUNTER — Inpatient Hospital Stay (HOSPITAL_COMMUNITY)
Admission: EM | Admit: 2015-04-23 | Discharge: 2015-05-02 | DRG: 239 | Disposition: A | Discharge status: Home Health | Payer: Medicare Other | Attending: Internal Medicine | Admitting: Internal Medicine

## 2015-04-23 ENCOUNTER — Emergency Department (HOSPITAL_COMMUNITY): Payer: Medicare Other

## 2015-04-23 DIAGNOSIS — E039 Hypothyroidism, unspecified: Secondary | ICD-10-CM | POA: Diagnosis present

## 2015-04-23 DIAGNOSIS — Z992 Dependence on renal dialysis: Secondary | ICD-10-CM | POA: Diagnosis not present

## 2015-04-23 DIAGNOSIS — G4730 Sleep apnea, unspecified: Secondary | ICD-10-CM | POA: Diagnosis present

## 2015-04-23 DIAGNOSIS — Z79899 Other long term (current) drug therapy: Secondary | ICD-10-CM

## 2015-04-23 DIAGNOSIS — H5442 Blindness, left eye, normal vision right eye: Secondary | ICD-10-CM | POA: Diagnosis present

## 2015-04-23 DIAGNOSIS — E11319 Type 2 diabetes mellitus with unspecified diabetic retinopathy without macular edema: Secondary | ICD-10-CM | POA: Diagnosis present

## 2015-04-23 DIAGNOSIS — D631 Anemia in chronic kidney disease: Secondary | ICD-10-CM | POA: Diagnosis present

## 2015-04-23 DIAGNOSIS — Z951 Presence of aortocoronary bypass graft: Secondary | ICD-10-CM | POA: Diagnosis not present

## 2015-04-23 DIAGNOSIS — M1A9XX1 Chronic gout, unspecified, with tophus (tophi): Secondary | ICD-10-CM | POA: Diagnosis present

## 2015-04-23 DIAGNOSIS — Z6834 Body mass index (BMI) 34.0-34.9, adult: Secondary | ICD-10-CM

## 2015-04-23 DIAGNOSIS — E785 Hyperlipidemia, unspecified: Secondary | ICD-10-CM | POA: Diagnosis present

## 2015-04-23 DIAGNOSIS — I96 Gangrene, not elsewhere classified: Secondary | ICD-10-CM

## 2015-04-23 DIAGNOSIS — Z888 Allergy status to other drugs, medicaments and biological substances status: Secondary | ICD-10-CM | POA: Diagnosis not present

## 2015-04-23 DIAGNOSIS — E876 Hypokalemia: Secondary | ICD-10-CM | POA: Diagnosis present

## 2015-04-23 DIAGNOSIS — K3184 Gastroparesis: Secondary | ICD-10-CM | POA: Diagnosis present

## 2015-04-23 DIAGNOSIS — S81809A Unspecified open wound, unspecified lower leg, initial encounter: Secondary | ICD-10-CM

## 2015-04-23 DIAGNOSIS — I5032 Chronic diastolic (congestive) heart failure: Secondary | ICD-10-CM | POA: Diagnosis present

## 2015-04-23 DIAGNOSIS — R112 Nausea with vomiting, unspecified: Secondary | ICD-10-CM | POA: Diagnosis present

## 2015-04-23 DIAGNOSIS — R0602 Shortness of breath: Secondary | ICD-10-CM | POA: Diagnosis present

## 2015-04-23 DIAGNOSIS — E860 Dehydration: Secondary | ICD-10-CM | POA: Diagnosis present

## 2015-04-23 DIAGNOSIS — Z7982 Long term (current) use of aspirin: Secondary | ICD-10-CM | POA: Diagnosis not present

## 2015-04-23 DIAGNOSIS — I120 Hypertensive chronic kidney disease with stage 5 chronic kidney disease or end stage renal disease: Secondary | ICD-10-CM | POA: Diagnosis present

## 2015-04-23 DIAGNOSIS — Z887 Allergy status to serum and vaccine status: Secondary | ICD-10-CM

## 2015-04-23 DIAGNOSIS — Z794 Long term (current) use of insulin: Secondary | ICD-10-CM

## 2015-04-23 DIAGNOSIS — L8990 Pressure ulcer of unspecified site, unspecified stage: Secondary | ICD-10-CM | POA: Diagnosis present

## 2015-04-23 DIAGNOSIS — Z9849 Cataract extraction status, unspecified eye: Secondary | ICD-10-CM

## 2015-04-23 DIAGNOSIS — N186 End stage renal disease: Secondary | ICD-10-CM | POA: Diagnosis present

## 2015-04-23 DIAGNOSIS — E1143 Type 2 diabetes mellitus with diabetic autonomic (poly)neuropathy: Secondary | ICD-10-CM | POA: Diagnosis present

## 2015-04-23 DIAGNOSIS — I2510 Atherosclerotic heart disease of native coronary artery without angina pectoris: Secondary | ICD-10-CM | POA: Diagnosis present

## 2015-04-23 DIAGNOSIS — I70269 Atherosclerosis of native arteries of extremities with gangrene, unspecified extremity: Secondary | ICD-10-CM

## 2015-04-23 DIAGNOSIS — M898X9 Other specified disorders of bone, unspecified site: Secondary | ICD-10-CM | POA: Diagnosis present

## 2015-04-23 DIAGNOSIS — R1110 Vomiting, unspecified: Secondary | ICD-10-CM | POA: Diagnosis not present

## 2015-04-23 DIAGNOSIS — D869 Sarcoidosis, unspecified: Secondary | ICD-10-CM | POA: Diagnosis present

## 2015-04-23 DIAGNOSIS — I5022 Chronic systolic (congestive) heart failure: Secondary | ICD-10-CM | POA: Diagnosis not present

## 2015-04-23 DIAGNOSIS — E1152 Type 2 diabetes mellitus with diabetic peripheral angiopathy with gangrene: Principal | ICD-10-CM | POA: Diagnosis present

## 2015-04-23 DIAGNOSIS — I25810 Atherosclerosis of coronary artery bypass graft(s) without angina pectoris: Secondary | ICD-10-CM | POA: Diagnosis not present

## 2015-04-23 DIAGNOSIS — E1122 Type 2 diabetes mellitus with diabetic chronic kidney disease: Secondary | ICD-10-CM | POA: Diagnosis present

## 2015-04-23 DIAGNOSIS — I998 Other disorder of circulatory system: Secondary | ICD-10-CM | POA: Diagnosis not present

## 2015-04-23 DIAGNOSIS — R1111 Vomiting without nausea: Secondary | ICD-10-CM

## 2015-04-23 DIAGNOSIS — M109 Gout, unspecified: Secondary | ICD-10-CM | POA: Diagnosis present

## 2015-04-23 DIAGNOSIS — I70209 Unspecified atherosclerosis of native arteries of extremities, unspecified extremity: Secondary | ICD-10-CM | POA: Diagnosis present

## 2015-04-23 DIAGNOSIS — I951 Orthostatic hypotension: Secondary | ICD-10-CM | POA: Diagnosis present

## 2015-04-23 LAB — COMPREHENSIVE METABOLIC PANEL
ALBUMIN: 2.7 g/dL — AB (ref 3.5–5.0)
ALT: 26 U/L (ref 17–63)
ANION GAP: 16 — AB (ref 5–15)
AST: 36 U/L (ref 15–41)
Alkaline Phosphatase: 128 U/L — ABNORMAL HIGH (ref 38–126)
BILIRUBIN TOTAL: 0.5 mg/dL (ref 0.3–1.2)
BUN: 44 mg/dL — ABNORMAL HIGH (ref 6–20)
CO2: 24 mmol/L (ref 22–32)
Calcium: 8.8 mg/dL — ABNORMAL LOW (ref 8.9–10.3)
Chloride: 91 mmol/L — ABNORMAL LOW (ref 101–111)
Creatinine, Ser: 7.17 mg/dL — ABNORMAL HIGH (ref 0.61–1.24)
GFR calc Af Amer: 8 mL/min — ABNORMAL LOW (ref >60)
GFR calc non Af Amer: 7 mL/min — ABNORMAL LOW (ref >60)
GLUCOSE: 274 mg/dL — AB (ref 65–99)
POTASSIUM: 3 mmol/L — AB (ref 3.5–5.1)
SODIUM: 131 mmol/L — AB (ref 135–145)
TOTAL PROTEIN: 7.7 g/dL (ref 6.5–8.1)

## 2015-04-23 LAB — BETA-HYDROXYBUTYRIC ACID: Beta-Hydroxybutyric Acid: 0.08 mmol/L (ref 0.05–0.27)

## 2015-04-23 LAB — CBC
HEMATOCRIT: 40.7 % (ref 39.0–52.0)
HEMOGLOBIN: 13.1 g/dL (ref 13.0–17.0)
MCH: 31.3 pg (ref 26.0–34.0)
MCHC: 32.2 g/dL (ref 30.0–36.0)
MCV: 97.4 fL (ref 78.0–100.0)
Platelets: 365 10*3/uL (ref 150–400)
RBC: 4.18 MIL/uL — ABNORMAL LOW (ref 4.22–5.81)
RDW: 14.2 % (ref 11.5–15.5)
WBC: 10.1 10*3/uL (ref 4.0–10.5)

## 2015-04-23 LAB — I-STAT VENOUS BLOOD GAS, ED
Acid-Base Excess: 3 mmol/L — ABNORMAL HIGH (ref 0.0–2.0)
BICARBONATE: 30 meq/L — AB (ref 20.0–24.0)
O2 Saturation: 40 %
TCO2: 31 mmol/L (ref 0–100)
pCO2, Ven: 50.7 mmHg — ABNORMAL HIGH (ref 45.0–50.0)
pH, Ven: 7.379 — ABNORMAL HIGH (ref 7.250–7.300)
pO2, Ven: 24 mmHg — CL (ref 30.0–45.0)

## 2015-04-23 LAB — MAGNESIUM: Magnesium: 1.8 mg/dL (ref 1.7–2.4)

## 2015-04-23 LAB — TSH: TSH: 6.805 u[IU]/mL — ABNORMAL HIGH (ref 0.350–4.500)

## 2015-04-23 LAB — CREATININE, SERUM
CREATININE: 7.2 mg/dL — AB (ref 0.61–1.24)
GFR calc Af Amer: 8 mL/min — ABNORMAL LOW (ref >60)
GFR calc non Af Amer: 7 mL/min — ABNORMAL LOW (ref >60)

## 2015-04-23 LAB — LIPASE, BLOOD: LIPASE: 29 U/L (ref 11–51)

## 2015-04-23 LAB — I-STAT CG4 LACTIC ACID, ED: Lactic Acid, Venous: 2.75 mmol/L (ref 0.5–2.0)

## 2015-04-23 LAB — GLUCOSE, CAPILLARY: GLUCOSE-CAPILLARY: 320 mg/dL — AB (ref 65–99)

## 2015-04-23 LAB — TROPONIN I: TROPONIN I: 0.07 ng/mL — AB (ref <0.031)

## 2015-04-23 MED ORDER — LEVALBUTEROL HCL 0.63 MG/3ML IN NEBU: 0.6300 mg | INHALATION_SOLUTION | Freq: Four times a day (QID) | RESPIRATORY_TRACT | Status: DC | PRN

## 2015-04-23 MED ORDER — PANTOPRAZOLE SODIUM 40 MG PO TBEC
40.0000 mg | DELAYED_RELEASE_TABLET | Freq: Every day | ORAL | Status: DC
  Administered 2015-04-23 – 2015-04-27 (×5): 40 mg via ORAL
  Filled 2015-04-23 (×5): qty 1

## 2015-04-23 MED ORDER — HEPARIN 1000 UNIT/ML FOR PERITONEAL DIALYSIS
500.0000 [IU] | INTRAMUSCULAR | Status: DC | PRN
  Filled 2015-04-23: qty 0.5

## 2015-04-23 MED ORDER — DEXTROSE 5 % IV SOLN
1.0000 g | Freq: Once | INTRAVENOUS | Status: AC
  Administered 2015-04-23: 1 g via INTRAVENOUS
  Filled 2015-04-23: qty 1

## 2015-04-23 MED ORDER — SODIUM CHLORIDE 0.9 % IJ SOLN
3.0000 mL | Freq: Two times a day (BID) | INTRAMUSCULAR | Status: DC
  Administered 2015-04-24 – 2015-04-29 (×9): 3 mL via INTRAVENOUS

## 2015-04-23 MED ORDER — INSULIN ASPART 100 UNIT/ML ~~LOC~~ SOLN
0.0000 [IU] | Freq: Three times a day (TID) | SUBCUTANEOUS | Status: DC
  Administered 2015-04-24: 3 [IU] via SUBCUTANEOUS
  Administered 2015-04-24: 1 [IU] via SUBCUTANEOUS
  Administered 2015-04-24: 2 [IU] via SUBCUTANEOUS
  Administered 2015-04-25: 1 [IU] via SUBCUTANEOUS
  Administered 2015-04-25 (×2): 2 [IU] via SUBCUTANEOUS
  Administered 2015-04-26: 3 [IU] via SUBCUTANEOUS
  Administered 2015-04-26: 1 [IU] via SUBCUTANEOUS
  Administered 2015-04-27: 2 [IU] via SUBCUTANEOUS
  Administered 2015-04-27: 3 [IU] via SUBCUTANEOUS
  Administered 2015-04-27: 7 [IU] via SUBCUTANEOUS
  Administered 2015-04-27: 5 [IU] via SUBCUTANEOUS
  Administered 2015-04-28: 3 [IU] via SUBCUTANEOUS
  Administered 2015-04-29: 1 [IU] via SUBCUTANEOUS
  Administered 2015-04-29: 3 [IU] via SUBCUTANEOUS
  Administered 2015-04-29: 1 [IU] via SUBCUTANEOUS
  Administered 2015-04-30: 3 [IU] via SUBCUTANEOUS
  Administered 2015-05-01: 2 [IU] via SUBCUTANEOUS
  Administered 2015-05-01: 1 [IU] via SUBCUTANEOUS
  Administered 2015-05-01: 3 [IU] via SUBCUTANEOUS

## 2015-04-23 MED ORDER — DELFLEX-LC/1.5% DEXTROSE 346 MOSM/L IP SOLN
Freq: Every day | INTRAPERITONEAL | Status: DC
  Administered 2015-04-23: 3000 mL via INTRAPERITONEAL
  Administered 2015-04-24: 22:00:00 via INTRAPERITONEAL
  Administered 2015-04-25: 3000 mL via INTRAPERITONEAL

## 2015-04-23 MED ORDER — SODIUM CHLORIDE 0.9 % IJ SOLN: 3.0000 mL | INTRAMUSCULAR | Status: DC | PRN

## 2015-04-23 MED ORDER — ONDANSETRON 4 MG PO TBDP
4.0000 mg | ORAL_TABLET | Freq: Once | ORAL | Status: AC
  Administered 2015-04-23: 4 mg via ORAL
  Filled 2015-04-23: qty 1

## 2015-04-23 MED ORDER — INSULIN GLARGINE 100 UNIT/ML ~~LOC~~ SOLN
40.0000 [IU] | Freq: Every day | SUBCUTANEOUS | Status: DC
  Administered 2015-04-23 – 2015-05-01 (×9): 40 [IU] via SUBCUTANEOUS
  Filled 2015-04-23 (×11): qty 0.4

## 2015-04-23 MED ORDER — ONDANSETRON HCL 4 MG/2ML IJ SOLN: 4.0000 mg | Freq: Four times a day (QID) | INTRAMUSCULAR | Status: DC | PRN

## 2015-04-23 MED ORDER — ACETAMINOPHEN 650 MG RE SUPP: 650.0000 mg | Freq: Four times a day (QID) | RECTAL | Status: DC | PRN

## 2015-04-23 MED ORDER — SODIUM CHLORIDE 0.9 % IV BOLUS (SEPSIS)
500.0000 mL | Freq: Once | INTRAVENOUS | Status: AC
  Administered 2015-04-23: 500 mL via INTRAVENOUS

## 2015-04-23 MED ORDER — LEVOTHYROXINE SODIUM 112 MCG PO TABS
112.0000 ug | ORAL_TABLET | Freq: Every day | ORAL | Status: DC
  Administered 2015-04-24 – 2015-05-02 (×8): 112 ug via ORAL
  Filled 2015-04-23 (×10): qty 1

## 2015-04-23 MED ORDER — ACETAMINOPHEN 325 MG PO TABS: 650.0000 mg | ORAL_TABLET | Freq: Four times a day (QID) | ORAL | Status: DC | PRN

## 2015-04-23 MED ORDER — VANCOMYCIN HCL IN DEXTROSE 1-5 GM/200ML-% IV SOLN
1000.0000 mg | Freq: Once | INTRAVENOUS | Status: DC
  Filled 2015-04-23: qty 200

## 2015-04-23 MED ORDER — VANCOMYCIN HCL 10 G IV SOLR
2000.0000 mg | Freq: Once | INTRAVENOUS | Status: AC
  Administered 2015-04-23: 2000 mg via INTRAVENOUS
  Filled 2015-04-23: qty 2000

## 2015-04-23 MED ORDER — METOCLOPRAMIDE HCL 5 MG PO TABS: 5.0000 mg | ORAL_TABLET | Freq: Three times a day (TID) | ORAL | Status: DC | PRN

## 2015-04-23 MED ORDER — HEPARIN SODIUM (PORCINE) 5000 UNIT/ML IJ SOLN
5000.0000 [IU] | Freq: Three times a day (TID) | INTRAMUSCULAR | Status: DC
  Administered 2015-04-23 – 2015-05-02 (×24): 5000 [IU] via SUBCUTANEOUS
  Filled 2015-04-23 (×22): qty 1

## 2015-04-23 MED ORDER — SODIUM CHLORIDE 0.9 % IJ SOLN
3.0000 mL | Freq: Two times a day (BID) | INTRAMUSCULAR | Status: DC
  Administered 2015-04-24 – 2015-04-29 (×6): 3 mL via INTRAVENOUS

## 2015-04-23 MED ORDER — ONDANSETRON HCL 4 MG PO TABS: 4.0000 mg | ORAL_TABLET | Freq: Four times a day (QID) | ORAL | Status: DC | PRN

## 2015-04-23 MED ORDER — ALLOPURINOL 100 MG PO TABS
100.0000 mg | ORAL_TABLET | Freq: Every day | ORAL | Status: DC
  Administered 2015-04-23 – 2015-05-02 (×10): 100 mg via ORAL
  Filled 2015-04-23 (×10): qty 1

## 2015-04-23 MED ORDER — OXYCODONE HCL 5 MG PO TABS: 5.0000 mg | ORAL_TABLET | ORAL | Status: DC | PRN

## 2015-04-23 MED ORDER — SODIUM CHLORIDE 0.9 % IV SOLN
250.0000 mL | INTRAVENOUS | Status: DC | PRN
  Administered 2015-04-30: 10:00:00 via INTRAVENOUS

## 2015-04-23 MED ORDER — ASPIRIN 325 MG PO TABS
325.0000 mg | ORAL_TABLET | Freq: Every day | ORAL | Status: DC
  Administered 2015-04-23 – 2015-05-02 (×10): 325 mg via ORAL
  Filled 2015-04-23 (×10): qty 1

## 2015-04-23 MED ORDER — MIDODRINE HCL 5 MG PO TABS
10.0000 mg | ORAL_TABLET | Freq: Three times a day (TID) | ORAL | Status: DC
  Administered 2015-04-23 – 2015-05-02 (×24): 10 mg via ORAL
  Filled 2015-04-23 (×24): qty 2

## 2015-04-23 NOTE — Progress Notes (Signed)
Pt was transferred to 6E19 due to peritoneal dialysis need.  Report called and given to Adaku.  Pt transferred to nursing unit by wheelchair by the charge nurse, Sonia Baller.

## 2015-04-23 NOTE — H&P (Addendum)
[facility] History and Physical  Jack Chung [id number] DOB: 1941/01/15 DOA: 04/23/2015  Referring physician: * PCP: [name], MD   Chief Complaint:  . Emesis        HPI:  74 y.o. 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 Per Mr. Hogenson, he has been unable to tolerate food, for the past month. He states that whenever he eats or drinks he will throw it up a few hours later. He does complain of some discomfort in the LUQ prior to emesis but denies any abdominal pain. He takes Reglan for possible gastroparesis with no relief of symptoms. Denies any chest pain or SOB. Denies fever, chills, diarrhea. He does not make urine. He does endorse ongoing increasing fatigue. Denies dysphagia. Right upper quadrant ultrasound on 04/21/15 did not show any acute findings, no gallstones,. He does not make any urine.  [facility] foot great toe discoloration/drainage for the last 1 month. Patient has not been able to keep any of his antibiotics down. He was   started on clindamycin and ciprofloxacin but was unable to keep his pills down. Because of his intractable nausea the patient needs to be admitted for IV antibiotics. Repeat left foot x-ray results are still pending..   Patient saw Dr Stacie Glaze GI progress  today  and was referred to the ED for possible admission for intractable nausea and vomiting, hypokalemia, dehydration, and possible DKA. Labs at their office revealed glucose of 344, BUN 49, Cr 6.98, Na 133, K+ 2.8. CBC at his baseline. Anion gap slightly elevated at 16. stat lactic acid 2.75, SCr 7.17       Review of Systems: negative for the following   Constitutional: Denies fever, chills, diaphoresis, appetite change and fatigue.  HEENT: Denies photophobia, eye pain, redness, hearing loss, ear pain, congestion, sore throat, rhinorrhea, sneezing, mouth sores, trouble swallowing, neck pain, neck stiffness and tinnitus.  Respiratory: Denies SOB, DOE, cough, chest tightness, and wheezing.  Cardiovascular: Denies chest pain, palpitations and leg swelling.  Gastrointestinal:positve for  nausea, vomiting, abdominal pain, diarrhea, constipation, blood in stool and abdominal distention.  Genitourinary: Denies dysuria, urgency, frequency, hematuria, flank pain and difficulty urinating.  Musculoskeletal: Positive for joint swelling and gait problem. Skin: Denies pallor, rash and wound.  Neurological: Denies dizziness, seizures, syncope, weakness, light-headedness, numbness and headaches.  Hematological: Denies adenopathy. Easy bruising, personal or family bleeding history  Psychiatric/Behavioral: Denies suicidal ideation, mood changes, confusion, nervousness, sleep disturbance and agitation       Past Medical History  Diagnosis Date  . Hypertension   . Diabetes mellitus   . Hypothyroidism   . CHF (congestive heart failure) ([place])   . Anemia   . Blind left eye   . Sleep apnea     not on CPAP  . Coronary artery disease     CABG x 11 Jun 2009.  MRSA infections of incsions  . Arthritis   . Sarcoidosis ([place])     Hx:of  . Diabetic retinopathy ([place])     Hx: of bilateral  . Edema   . Hyperlipidemia   . 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 Atherosclerosis of native arteries of the extremities, unspecified   .  Eczema     Hx: of  . Insomnia   . Seasonal allergies   . ESRD on dialysis [facility])     Was on dialysis in July 2011 and then stopped and restarted May 2014. He then transitioned to PD in Aug 2014. He does PD on cycler when at home and CAPD 6 exchanges per day when travelling. His wife is "in charge" and does the  exchanges.   . History of blood transfusion     with heart surgery  . Coronary atherosclerosis of native coronary artery   . Atherosclerosis of native arteries of the extremities, unspecified      Past Surgical History  Procedure Laterality Date  . Cardiac surgery      total of 6 surgeries, 5 related to mrsa  . Coronary artery bypass graft  06/2009  . Cataract surgery      Hx: of  . Debridements      Hx: of secondary to MRSA  . Av fistula placement Right 10/11/2012    Procedure: ARTERIOVENOUS (AV) FISTULA CREATION;  Surgeon: Rosetta Posner, MD;  Location: [place];  Service: Vascular;  Laterality: Right;  . Insertion of dialysis catheter Left   . Capd insertion N/A 11/28/2012    Procedure: LAPAROSCOPIC PERITONEAL DIALYSIS CATHETER PLACEMENT;  Surgeon: Ralene Ok, MD;  Location: [place];  Service: General;  Laterality: N/A;  . Left heart catheterization with coronary/graft angiogram N/A 01/31/2014    Procedure: LEFT HEART CATHETERIZATION WITH Beatrix Fetters;  Surgeon: Troy Sine, MD;  Location: [facility] C[facility] LAB;  Service: Cardiovascular;  Laterality: N/A;      Social History:  reports that he has never smoked. He has never used smokeless tobacco. He reports that he does not drink alcohol or use illicit drugs.    Allergies  Allergen Reactions  . Flu Virus Vaccine     Gets the flu, patient has bad reaction to the flu vaccine.  . Lipitor [Atorvastatin]     Leg cramps    Family History  Problem Relation Age of Onset  . COPD Mother   . COPD Father         Prior to Admission medications   Medication Sig Start Date End Date Taking? Authorizing Provider  allopurinol (ZYLOPRIM) 100 MG tablet Take 100 mg by mouth daily.   Yes Historical Provider, MD  aspirin 325 MG tablet Take 325 mg by mouth daily.   Yes Historical Provider, MD  calcium carbonate (TUMS - DOSED IN MG ELEMENTAL CALCIUM) 500 MG chewable tablet Chew 2 tablets by mouth 3 (three) times daily with meals.    Yes  Historical Provider, MD  cetirizine (ZYRTEC) 10 MG tablet Take 10 mg by mouth daily.   Yes Historical Provider, MD  ciprofloxacin (CIPRO) 500 MG tablet Take 1 tablet (500 mg total) by mouth daily. 04/22/15  Yes Trula Slade, DPM  clindamycin (CLEOCIN) 300 MG capsule Take 1 capsule (300 mg total) by mouth 3 (three) times daily. 04/22/15  Yes Trula Slade, DPM  insulin aspart (NOVOLOG) 100 UNIT/ML injection Inject 10-15 Units into the skin 3 (three) times daily with meals. Take none if blood sugar < 150, 151-200 2 units, 201-250 4 units, 251-300 6 units, 301-350 8 units   Yes Historical Provider, MD  Insulin Glargine (LANTUS) 100 UNIT/ML Solostar Pen Inject 45 units into the skin daily. Dx 250.00 Patient taking differently: 40 Units by Subconjunctival route at bedtime.  07/26/13  Yes Lauree Chandler, NP  lactulose [facility])  10 GM/15ML solution Take 10 g by mouth every 4 (four) hours as needed for mild constipation.   Yes Historical Provider, MD  levothyroxine (SYNTHROID, LEVOTHROID) 112 MCG tablet LABS/APPOINTMENT OVERDUE, 1 by mouth daily in the morning 30 minutes before breakfast for Thyroid 12/10/13  Yes Tiffany L Reed, DO  loperamide (IMODIUM) 2 MG capsule Take 2 mg by mouth daily as needed for diarrhea or loose stools.   Yes Historical Provider, MD  metoCLOPramide (REGLAN) 5 MG tablet Take 5 mg by mouth every 8 (eight) hours as needed for nausea.   Yes Historical Provider, MD  midodrine (PROAMATINE) 10 MG tablet Take 1 tablet (10 mg total) by mouth 3 (three) times daily. 03/11/15  Yes Pixie Casino, MD  Multiple Vitamin (MULTI VITAMIN DAILY PO) Take 1 tablet by mouth daily.   Yes Historical Provider, MD  omeprazole (PRILOSEC) 10 MG capsule Take 10 mg by mouth daily.   Yes Historical Provider, MD  PERITONEAL DIALYSIS SOLUTIONS IP Inject 2,500 Units into the peritoneum 4 (four) times daily.    Yes Historical Provider, MD  potassium chloride SA (K-DUR,KLOR-CON) 20 MEQ tablet Take 20 mEq by  mouth daily.   Yes Historical Provider, MD  silver sulfADIAZINE (SILVADENE) 1 % cream Apply 1 application topically daily. 04/22/15  Yes Trula Slade, DPM     Physical Exam: Filed Vitals:   04/23/15 1437  BP: 97/83  Pulse: 97  Temp: 97.8 F (36.6 C)  TempSrc: Oral  Resp: 18  Weight: 107.502 kg (237 lb)  SpO2: 99%   Mucous membranes slightly dry  Eyes: Conjunctivae and EOM are normal. Pupils are equal, round, and reactive to light. No scleral icterus.  Neck: Normal range of motion. Neck supple.  Cardiovascular: Normal rate, regular rhythm and normal heart sounds.  Pulmonary/Chest: Effort normal and breath sounds normal. No respiratory distress. He has no wheezes. He exhibits no tenderness.  Abdominal: Soft. Bowel sounds are normal. He exhibits no distension. There is no tenderness. There is no rebound and no guarding.  Musculoskeletal: He exhibits no edema or tenderness.  Lymphadenopathy:   He has no cervical adenopathy.  Neurological: He is alert and oriented to person, place, and time. No cranial nerve deficit.  Skin: Skin is warm and dry. He is not diaphoretic.  L toe black, necrotic with some erythema extending down to toe base. Faint erythematous streak from L great toe base down dorsum of foot.  Psychiatric: Normal mood and affect. speech and behavior is normal. Judgment and thought content normal. Cognition and memory are normal.      Data Review   Micro Results No results found for this or any previous visit (from the past 240 hour(s)).  Radiology Reports US Abdomen Complete  04/21/2015  CLINICAL DATA:  NAUSEA AND VOMITING. Symptoms for 1 week. No reported pain. EXAM: ULTRASOUND ABDOMEN COMPLETE COMPARISON:  CT, 07/17/2013 FINDINGS: Gallbladder: No gallstones or wall thickening visualized. No sonographic Murphy sign noted. Common bile duct: Diameter: 3.8 mm Liver: Coarsened echotexture with no discrete mass or focal lesion. Normal in size. Hepatopetal flow  documented in the portal vein. IVC: Obscured by midline bowel gas. Pancreas: Obscured by midline bowel gas. Spleen: Size and appearance within normal limits. Right Kidney: Length: 8.5 cm. Mild increased parenchymal echogenicity with cortical thinning. No mass, stone or hydronephrosis. Left Kidney: Length: 10.1 cm. Echogenicity within normal limits. No mass or hydronephrosis visualized. Abdominal aorta: Proximal and midportion obscured by bowel gas. Normal caliber distal aorta and iliac arteries. Other  findings: Small to moderate amount of ascites. IMPRESSION: 1. No acute findings. 2. No gallstones.  No bile duct dilation. 3. Exam somewhat limited. Bowel gas obscures midline structures including the inferior vena cava, most of the aorta and the pancreas. 4. Right renal cortical thinning. Increased parenchymal echogenicity. This is consistent with medical renal disease. 5. Coarsened echotexture of the liver, nonspecific. 6. Small to moderate amount of ascites. Electronically Signed   By: Lajean Manes M.D.   On: 04/21/2015 15:38     CBC  Recent Labs Lab 04/23/15 1446  WBC 10.1  HGB 13.1  HCT 40.7  PLT 365  MCV 97.4  MCH 31.3  MCHC 32.2  RDW 14.2    Chemistries   Recent Labs Lab 04/23/15 1446  NA 131*  K 3.0*  CL 91*  CO2 24  GLUCOSE 274*  BUN 44*  CREATININE 7.17*  CALCIUM 8.8*  AST 36  ALT 26  ALKPHOS 128*  BILITOT 0.5   ------------------------------------------------------------------------------------------------------------------ estimated creatinine clearance is 11.4 mL/min (by C-G formula based on Cr of 7.17). ------------------------------------------------------------------------------------------------------------------ No results for input(s): HGBA1C in the last 72 hours. ------------------------------------------------------------------------------------------------------------------ No results for input(s): CHOL, HDL, LDLCALC, TRIG, CHOLHDL, LDLDIRECT in the last  72 hours. ------------------------------------------------------------------------------------------------------------------ No results for input(s): TSH, T4TOTAL, T3FREE, THYROIDAB in the last 72 hours.  Invalid input(s): FREET3 ------------------------------------------------------------------------------------------------------------------ No results for input(s): VITAMINB12, FOLATE, FERRITIN, TIBC, IRON, RETICCTPCT in the last 72 hours.  Coagulation profile No results for input(s): INR, PROTIME in the last 168 hours.  No results for input(s): DDIMER in the last 72 hours.  Cardiac Enzymes No results for input(s): CKMB, TROPONINI, MYOGLOBIN in the last 168 hours.  Invalid input(s): CK ------------------------------------------------------------------------------------------------------------------ Invalid input(s): POCBNP   CBG: No results for input(s): GLUCAP in the last 168 hours.     EKG: Independently reviewed.    Assessment/Plan  Intractable nausea vomiting Likely an exacerbation of gastroparesis, versus mild dka, right upper quadrant ultrasound negative, rule out bacterial peritonitis secondary to peritoneal dialysis catheter, will send off peritoneal fluid cell count and culture, start the patient on broad-spectrum antibiotics namely vancomycin and Fortaz. Patient saw Dr. Paulita Fujita in the clinic. Will obtain a barium esophagogram in am , CT abdomen pelvis without contrast, consult gastroenterology in the morning for further recommendations, may need EGD  Continue with Reglan for now Minimize narcotics  Gangrene of the left great toe-blood culture 2, lactate elevated, x-rays of the left toe still pending, orthopedic consultation requested by EDP.EDP poke to Dr. Erlinda Hong of ortho  History of orthostatic hypertension-continue midodrine, hypertensive in the 180s  Uncontrolled diabetes/mild DKA, will start Accu-Cheks every 4 hours, lactate mildly elevated contributing to elevated  anion gap, hopefully anion gap metabolic acidosis will resolve as the infection history.  Hypokalemia to be treated by nephrology    Chronic CHF (congestive heart failure) (HCC)-patient without any signs and symptoms of fluid overload of exacerbation, most recent EF   55-60%, will obtain chest x-ray, patient does state that he's had a cough associated with intractable nausea, rule out aspiration      Sarcoidosis (HCC)-previously on prednisone    Sleep apnea    ESRD on dialysis [facility]) on peritoneal dialysis, Dr. Jeani Hawking has been requested for consultation  Morbid obesity Body mass index is 34.98 kg/(m^2).     Gout-continue allopurinol    CAD (coronary artery disease)-multivessel coronary artery disease, medical therapy recommended, followed by [name], status post 3 vessel CABG in 2011, cycle cardiac enzymes  Code Status:   full Family Communication: bedside Disposition Plan: admit   Total time spent 55 minutes.Greater than 50% of this time was spent in counseling, explanation of diagnosis, planning of further management, and coordination of care  [facility] Pager [phone]  If 7PM-7AM, please contact night-coverage [email or web address] Password [facility] 04/23/2015, 5:44 PM

## 2015-04-23 NOTE — Progress Notes (Signed)
ANTIBIOTIC CONSULT NOTE - INITIAL  Pharmacy Consult for vancomycin + ceftazidime  Indication: wound infection  Allergies  Allergen Reactions  . Flu Virus Vaccine     Gets the flu, patient has bad reaction to the flu vaccine.  . Lipitor [Atorvastatin]     Leg cramps    Patient Measurements: Weight: 237 lb (107.502 kg)  Vital Signs: Temp: 97.8 F (36.6 C) (11/16 1437) Temp Source: Oral (11/16 1437) BP: 97/83 mmHg (11/16 1437) Pulse Rate: 97 (11/16 1437) Intake/Output from previous day:   Intake/Output from this shift:    Labs:  Recent Labs  04/23/15 1446  WBC 10.1  HGB 13.1  PLT 365  CREATININE 7.17*   Estimated Creatinine Clearance: 11.4 mL/min (by C-G formula based on Cr of 7.17). No results for input(s): VANCOTROUGH, VANCOPEAK, VANCORANDOM, GENTTROUGH, GENTPEAK, GENTRANDOM, TOBRATROUGH, TOBRAPEAK, TOBRARND, AMIKACINPEAK, AMIKACINTROU, AMIKACIN in the last 72 hours.   Microbiology: No results found for this or any previous visit (from the past 720 hour(s)).  Medical History: Past Medical History  Diagnosis Date  . Hypertension   . Diabetes mellitus   . Hypothyroidism   . CHF (congestive heart failure) ([place])   . Anemia   . Blind left eye   . Sleep apnea     not on CPAP  . Coronary artery disease     CABG x 11 Jun 2009.  MRSA infections of incsions  . Arthritis   . Sarcoidosis ([place])     Hx:of  . Diabetic retinopathy ([place])     Hx: of bilateral  . Edema   . Hyperlipidemia   . 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 Atherosclerosis of native arteries of the extremities, unspecified   . Eczema     Hx: of  . Insomnia   . Seasonal allergies   . ESRD on dialysis [facility])     Was on dialysis in July 2011 and then stopped and restarted May 2014. He then transitioned to PD in Aug 2014. He does PD on cycler when at home and CAPD 6 exchanges per day when travelling. His wife is "in charge" and does the exchanges.   . History of blood  transfusion     with heart surgery  . Coronary atherosclerosis of native coronary artery   . Atherosclerosis of native arteries of the extremities, unspecified     Assessment: 74 yo m presenting to the ED on 11/16 with nausea and vomiting x 1 month.  Patient has been on clindamycin and cipro for the last month for left great toe discoloration and drainage, but he has not been able to keep any of the abx down. Patient is to be admitted to receive IV abx.  Patient is on PD at home and is anuric. Wbc 10.1, afeb.   Vanc 11/16 >> Ceftazidime 11/16 >>  11/16 BCx: 11/16 Peritoneal body fluid:  Goal of Therapy:  Vancomycin trough level 10-15 mcg/ml  Plan:  Vancomycin 2 gm IV load x 1 Ceftazidime 1 gm IV load x 1 F/u plans for PD/HD while inpatient to determine further dosing Monitor cx, cbc, clincal course  Cassie L. Nicole Kindred, PharmD PGY2 Infectious Diseases Pharmacy Resident Pager: [phone] 04/23/2015 6:10 PM

## 2015-04-23 NOTE — ED Notes (Signed)
Reports for the past month he has been eating and vomiting a few hours after he eats. Reports he had an appt with GI and was sent here for further evaluation. Denies any abd pain.

## 2015-04-23 NOTE — ED Provider Notes (Signed)
CSN: [id number]     Arrival date & time 04/23/15  1413 History   First MD Initiated Contact with Patient 04/23/15 1445     Chief Complaint  Patient presents with  . Emesis    HPI  Jack Chung is an 74 y.o. 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 Per Jack Chung, he has been unable to tolerate PO for the past month. He states that whenever he eats or drinks he will throw it up a few hours later. He states that he does not feel nauseated prior to emesis. States he is throwing up undigested food but no blood or bile. He states he feels a "pressure" in his LUQ prior to emesis but denies any abdominal pain. He has been prescribed Reglan for possible gastroparesis with no relief of symptoms. Denies any chest pain or SOB. Denies fever, chills, diarrhea. He does not make urine. He does endorse ongoing increasing fatigue. Denies dysphagia. Abdominal US from 04/21/15 unremarkable.  Also of note, Jack Chung was seen by podiatry yesterday for infectious/gangrenous L great toe. He was started on PO abx with recommendation for amputation but pt reports Chung feels he likely needs IV abx as he has been unable to tolerate PO. Pt states that the infection looks worse to him since yesterday with erythema spreading down his foot.  Review of [facility] progress note today shows that he was seen by Jack Goody, PA-C and was referred to the ED for possible admission for intractable nausea and vomiting, hypokalemia, dehydration, and possible DKA. Labs at their office reveal glucose 344, BUN 49, Cr 6.98, Na 133, K+ 2.8. CBC at his baseline.   Past Medical History  Diagnosis Date  . Hypertension   . Diabetes mellitus   . Hypothyroidism   . CHF (congestive heart  failure) ([place])   . Anemia   . Blind left eye   . Sleep apnea     not on CPAP  . Coronary artery disease     CABG x 11 Jun 2009.  MRSA infections of incsions  . Arthritis   . Sarcoidosis ([place])     Hx:of  . Diabetic retinopathy ([place])     Hx: of bilateral  . Edema   . Hyperlipidemia   . 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 Atherosclerosis of native arteries of the extremities, unspecified   . Eczema     Hx: of  . Insomnia   . Seasonal allergies   . ESRD on dialysis [facility])     Was on dialysis in July 2011 and then stopped and restarted May 2014. He then transitioned to PD in Aug 2014. He does PD on cycler when at home and CAPD 6 exchanges per day when travelling. His wife is "in charge" and does the exchanges.   . History of blood transfusion     with heart surgery  . Coronary atherosclerosis of native coronary artery   . Atherosclerosis of native arteries of the extremities, unspecified    Past Surgical History  Procedure Laterality Date  . Cardiac surgery      total of 6 surgeries, 5 related to mrsa  . Coronary artery bypass graft  06/2009  . Cataract surgery  Hx: of  . Debridements      Hx: of secondary to MRSA  . Av fistula placement Right 10/11/2012    Procedure: ARTERIOVENOUS (AV) FISTULA CREATION;  Surgeon: Rosetta Posner, MD;  Location: [place];  Service: Vascular;  Laterality: Right;  . Insertion of dialysis catheter Left   . Capd insertion N/A 11/28/2012    Procedure: LAPAROSCOPIC PERITONEAL DIALYSIS CATHETER PLACEMENT;  Surgeon: Ralene Ok, MD;  Location: [place];  Service: General;  Laterality: N/A;  . Left heart catheterization with coronary/graft angiogram N/A 01/31/2014    Procedure: LEFT HEART CATHETERIZATION WITH Beatrix Fetters;  Surgeon: Troy Sine, MD;  Location: [facility] CATH LAB;  Service: Cardiovascular;  Laterality: N/A;   Family History  Problem Relation Age of Onset  . COPD Mother   . COPD Father    Social History   Substance Use Topics  . Smoking status: Never Smoker   . Smokeless tobacco: Never Used  . Alcohol Use: No    Review of Systems  All other systems reviewed and are negative.     Allergies  Flu virus vaccine and Lipitor  Home Medications   Prior to Admission medications   Medication Sig Start Date End Date Taking? Authorizing Provider  allopurinol (ZYLOPRIM) 100 MG tablet Take 100 mg by mouth daily.   Yes Historical Provider, MD  aspirin 325 MG tablet Take 325 mg by mouth daily.   Yes Historical Provider, MD  calcium carbonate (TUMS - DOSED IN MG ELEMENTAL CALCIUM) 500 MG chewable tablet Chew 2 tablets by mouth 3 (three) times daily with meals.    Yes Historical Provider, MD  cetirizine (ZYRTEC) 10 MG tablet Take 10 mg by mouth daily.   Yes Historical Provider, MD  ciprofloxacin (CIPRO) 500 MG tablet Take 1 tablet (500 mg total) by mouth daily. 04/22/15  Yes Trula Slade, DPM  clindamycin (CLEOCIN) 300 MG capsule Take 1 capsule (300 mg total) by mouth 3 (three) times daily. 04/22/15  Yes Trula Slade, DPM  insulin aspart (NOVOLOG) 100 UNIT/ML injection Inject 10-15 Units into the skin 3 (three) times daily with meals. Take none if blood sugar < 150, 151-200 2 units, 201-250 4 units, 251-300 6 units, 301-350 8 units   Yes Historical Provider, MD  Insulin Glargine (LANTUS) 100 UNIT/ML Solostar Pen Inject 45 units into the skin daily. Dx 250.00 Patient taking differently: 40 Units by Subconjunctival route at bedtime.  07/26/13  Yes Lauree Chandler, NP  lactulose (CHRONULAC) 10 GM/15ML solution Take 10 g by mouth every 4 (four) hours as needed for mild constipation.   Yes Historical Provider, MD  levothyroxine (SYNTHROID, LEVOTHROID) 112 MCG tablet LABS/APPOINTMENT OVERDUE, 1 by mouth daily in the morning 30 minutes before breakfast for Thyroid 12/10/13  Yes Tiffany L Reed, DO  loperamide (IMODIUM) 2 MG capsule Take 2 mg by mouth daily as needed for diarrhea or loose stools.   Yes  Historical Provider, MD  metoCLOPramide (REGLAN) 5 MG tablet Take 5 mg by mouth every 8 (eight) hours as needed for nausea.   Yes Historical Provider, MD  midodrine (PROAMATINE) 10 MG tablet Take 1 tablet (10 mg total) by mouth 3 (three) times daily. 03/11/15  Yes Pixie Casino, MD  Multiple Vitamin (MULTI VITAMIN DAILY PO) Take 1 tablet by mouth daily.   Yes Historical Provider, MD  omeprazole (PRILOSEC) 10 MG capsule Take 10 mg by mouth daily.   Yes Historical Provider, MD  PERITONEAL DIALYSIS SOLUTIONS IP Inject 2,500 Units  into the peritoneum 4 (four) times daily.    Yes Historical Provider, MD  potassium chloride SA (K-DUR,KLOR-CON) 20 MEQ tablet Take 20 mEq by mouth daily.   Yes Historical Provider, MD  silver sulfADIAZINE (SILVADENE) 1 % cream Apply 1 application topically daily. 04/22/15  Yes Trula Slade, DPM   BP 97/83 mmHg  Pulse 97  Temp(Src) 97.8 F (36.6 C) (Oral)  Resp 18  Wt 237 lb (107.502 kg)  SpO2 99% Physical Exam  Constitutional: He is oriented to person, place, and time. No distress.  Appears chronically ill but NAD  HENT:  Right Ear: External ear normal.  Left Ear: External ear normal.  Nose: Nose normal.  Mouth/Throat: No oropharyngeal exudate.  Mucous membranes slightly dry  Eyes: Conjunctivae and EOM are normal. Pupils are equal, round, and reactive to light. No scleral icterus.  Neck: Normal range of motion. Neck supple.  Cardiovascular: Normal rate, regular rhythm and normal heart sounds.   Pulmonary/Chest: Effort normal and breath sounds normal. No respiratory distress. He has no wheezes. He exhibits no tenderness.  Abdominal: Soft. Bowel sounds are normal. He exhibits no distension. There is no tenderness. There is no rebound and no guarding.  Musculoskeletal: He exhibits no edema or tenderness.  Lymphadenopathy:    He has no cervical adenopathy.  Neurological: He is alert and oriented to person, place, and time. No cranial nerve deficit.  Skin:  Skin is warm and dry. He is not diaphoretic.  L toe black, necrotic with some erythema extending down to toe base. Faint erythematous streak from L great toe base down dorsum of foot.  Psychiatric: He has a normal mood and affect.  Nursing note and vitals reviewed.   ED Course  Procedures (including critical care time) Labs Review Labs Reviewed  COMPREHENSIVE METABOLIC PANEL - Abnormal; Notable for the following:    Sodium 131 (*)    Potassium 3.0 (*)    Chloride 91 (*)    Glucose, Bld 274 (*)    BUN 44 (*)    Creatinine, Ser 7.17 (*)    Calcium 8.8 (*)    Albumin 2.7 (*)    Alkaline Phosphatase 128 (*)    GFR calc non Af Amer 7 (*)    GFR calc Af Amer 8 (*)    Anion gap 16 (*)    All other components within normal limits  CBC - Abnormal; Notable for the following:    RBC 4.18 (*)    All other components within normal limits  I-STAT VENOUS BLOOD GAS, ED - Abnormal; Notable for the following:    pH, Ven 7.379 (*)    pCO2, Ven 50.7 (*)    pO2, Ven 24.0 (*)    Bicarbonate 30.0 (*)    Acid-Base Excess 3.0 (*)    All other components within normal limits  I-STAT CG4 LACTIC ACID, ED - Abnormal; Notable for the following:    Lactic Acid, Venous 2.75 (*)    All other components within normal limits  LIPASE, BLOOD  BETA-HYDROXYBUTYRIC ACID    Imaging Review No results found. I have personally reviewed and evaluated these images and lab results as part of my medical decision-making.   EKG Interpretation None      MDM   Final diagnoses:  Intractable vomiting without nausea, vomiting of unspecified type  Gangrene of toe (HCC)    Will check cbc, cmp, lipase, betahydroxybutyric acid, i stat vbg, i stat lactic acid. Will get EKG given pt's history of atypical  presentation of ACS. I agree with GI PA-C, pt will likely need to be admitted for IV abx for toe 2/2 inability to tolerate po intake as well as Chung evaluation for intractable n/v. If K+ is low here will also need  supplementation.   i stat lactic acid 2.75, SCr 7.17, K+ 3.0, VBG shows venous pH 7.379, pCO2 50.7, pO2 24, bicarb 30. Given pt's intractable vomiting/inability to tolerate PO, I will consult hospitalist for admission for IV abx for infected L great toe and further management of intractable emesis.   Spoke to Dr. Allyson Sabal, will admit pt. In the meantime will get repeat XR of L foot and consult orthopedic surgery for recs.  Spoke to Dr. Erlinda Hong of ortho. Will continue with plan to get XR today. He will see pt in the AM.   Jack Ng, PA-C 04/23/15 1805  Sherwood Gambler, MD 04/24/15 [phone]

## 2015-04-23 NOTE — Consult Note (Signed)
Reason for Consult:ESRD Referring Physician: Dr. Reyne Dumas  Chief Complaint: Unable to keep anything down  Assessment/Plan: 1 Nausea/Vomiting - secondary to gastroparesis and less likely to be from peritonitis + infected left great toe. He's had a full cardiac workup recently in 2924 but certainly angina still in the differential. From his symptoms this appears to be more likely from gastroparesis with very atypical symptoms of angina. He's already on Reglan at home with helped initially. - consider adding Erythromycin to increase gastric motility. - rule out angina - will adjust fluid exchanges to minimize hypotensive episodes - wonder if  the infected toe is playing a role as well with chronic inflammation 2 ESRD: CAPD with 5 exchanges per day at 8am, 11am, 2pm, 5pm and overnight dwell at 8pm - followed by Dr. Pearson Grippe who recently increased dwell volume from 2.5L to 3L but pt has yet to receive those fluids. Uses 1.5% then 2.5% x3 and last exchange with 1.5%. EDW is 105kg but currently he's down to 100.5kg because of poor intake. - 1.5% alt with 2.5% while in [facility]ung --> if there are any hypotensive episodes, tachycardia etc will change to exclusively 1.5% - no signs of peritonitis --> they have never had peritonitis and spouse is meticulous with his care. Fluid has also been clear. - agree with checking a cell count and culture but not expecting to find peritonitis. - the 107.5kg weight in the Chung may not be accurate as his home weights have been 100.5kg (even with +2.5L which is currently in the abdomen) --> his BP is on the low side as well. - replete K 3 Hypertension: on the low side and will need to hold any anti-hypertensives. 4. Anemia of ESRD: stable 5. Metabolic Bone Disease: manage as outpatient but will check a phos   Dialyzes with Primary Nephrologist Dr. Pearson Grippe EDW old wt was 105kg (currently 100.5kg)  HPI: Jack Chung is a 74 y.o. male CAPD for the past  two years with 2.5L dwell volume recently increased to 3L but he has not started that yet. He alternates 1.5% with 2.5% x3 with the final exchange with 1.5%. EDW is listed as 105kg but he's down to 100.5kg. Presenting with intractable nausea vomiting for the past 2 weeks but this has been going on intermittently over the past month. The nausea occurs almost immediately after eating; he denies any diarrhea and actually has some constipation. Denies fever, chills, dyspnea or chest pain. He has some epigastric discomfort and denies any aggravating or alleviating factors. His exercise tolerance is very limited with severe weakness leading to 3 falls over the past week. He denies any dysphagia, sore throat, rhinorrhea, ear aches, myalgias. Currently anuric and denies and abdominal discomfort. Fluid from peritoneal drainage has been clear and spouse is meticulous with his care. He has never had any prior episodes of peritonitis. Currently he is on Reglan for presumably gastroparesis which helped initially. He also has a left great toe lesion seen by podiatry.    Past Medical History  Diagnosis Date  . Hypertension   . Diabetes mellitus   . Hypothyroidism   . CHF (congestive heart failure) ([place])   . Anemia   . Blind left eye   . Sleep apnea     not on CPAP  . Coronary artery disease     CABG x 11 Jun 2009.  MRSA infections of incsions  . Arthritis   . Sarcoidosis ([place])     Hx:of  . Diabetic  retinopathy ([place])     Hx: of bilateral  . Edema   . Hyperlipidemia   . 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 Atherosclerosis of native arteries of the extremities, unspecified   . Eczema     Hx: of  . Insomnia   . Seasonal allergies   . ESRD on dialysis Jack Chung)     Was on dialysis in July 2011 and then stopped and restarted May 2014. He then transitioned to PD in Aug 2014. He does PD on cycler when at home and CAPD 6 exchanges per day when travelling. His wife is "in charge" and does  the exchanges.   . History of blood transfusion     with heart surgery  . Coronary atherosclerosis of native coronary artery   . Atherosclerosis of native arteries of the extremities, unspecified      Allergies:  Allergies  Allergen Reactions  . Flu Virus Vaccine     Gets the flu, patient has bad reaction to the flu vaccine.  . Lipitor [Atorvastatin]     Leg cramps    Past Surgical History  Procedure Laterality Date  . Cardiac surgery      total of 6 surgeries, 5 related to mrsa  . Coronary artery bypass graft  06/2009  . Cataract surgery      Hx: of  . Debridements      Hx: of secondary to MRSA  . Av fistula placement Right 10/11/2012    Procedure: ARTERIOVENOUS (AV) FISTULA CREATION;  Surgeon: Rosetta Posner, MD;  Location: [place];  Service: Vascular;  Laterality: Right;  . Insertion of dialysis catheter Left   . Capd insertion N/A 11/28/2012    Procedure: LAPAROSCOPIC PERITONEAL DIALYSIS CATHETER PLACEMENT;  Surgeon: Ralene Ok, MD;  Location: [place];  Service: General;  Laterality: N/A;  . Left heart catheterization with coronary/graft angiogram N/A 01/31/2014    Procedure: LEFT HEART CATHETERIZATION WITH Beatrix Fetters;  Surgeon: Troy Sine, MD;  Location: [facility] CATH LAB;  Service: Cardiovascular;  Laterality: N/A;    Family History  Problem Relation Age of Onset  . COPD Mother   . COPD Father     Social History:  reports that he has never smoked. He has never used smokeless tobacco. He reports that he does not drink alcohol or use illicit drugs.  ROS Pertinent items are noted in HPI.  Blood pressure 97/83, pulse 97, temperature 97.8 F (36.6 C), temperature source Oral, resp. rate 18, weight 107.502 kg (237 lb), SpO2 99 %. General appearance: alert, cooperative and appears stated age Head: Normocephalic, without obvious abnormality, atraumatic Eyes: negative Nose: no discharge Neck: no adenopathy, no carotid bruit, no JVD, supple, symmetrical, trachea  midline and thyroid not enlarged, symmetric, no tenderness/mass/nodules Back: symmetric, no curvature. ROM normal. No CVA tenderness. Resp: clear to auscultation bilaterally Chest wall: no tenderness Cardio: regular rate and rhythm, S1, S2 normal, no murmur, click, rub or gallop GI: soft, non-tender; bowel sounds normal; no masses,  no organomegaly Extremities: left great toe dry gangrene at the tip with erythema behind it Pulses: 2+ and symmetric in the upper ext, 1+ lower Skin: Skin color, texture, turgor normal. No rashes or lesions Lymph nodes: Cervical, supraclavicular, and axillary nodes normal. Neurologic: Grossly normal  Results for orders placed or performed during the Chung encounter of 04/23/15 (from the past 48 hour(s))  Lipase, blood     Status: None   Collection Time: 04/23/15  2:46 PM  Result  Value Ref Range   Lipase 29 11 - 51 U/L  Comprehensive metabolic panel     Status: Abnormal   Collection Time: 04/23/15  2:46 PM  Result Value Ref Range   Sodium 131 (L) 135 - 145 mmol/L   Potassium 3.0 (L) 3.5 - 5.1 mmol/L   Chloride 91 (L) 101 - 111 mmol/L   CO2 24 22 - 32 mmol/L   Glucose, Bld 274 (H) 65 - 99 mg/dL   BUN 44 (H) 6 - 20 mg/dL   Creatinine, Ser 7.17 (H) 0.61 - 1.24 mg/dL   Calcium 8.8 (L) 8.9 - 10.3 mg/dL   Total Protein 7.7 6.5 - 8.1 g/dL   Albumin 2.7 (L) 3.5 - 5.0 g/dL   AST 36 15 - 41 U/L   ALT 26 17 - 63 U/L   Alkaline Phosphatase 128 (H) 38 - 126 U/L   Total Bilirubin 0.5 0.3 - 1.2 mg/dL   GFR calc non Af Amer 7 (L) >60 mL/min   GFR calc Af Amer 8 (L) >60 mL/min    Comment: (NOTE) The eGFR has been calculated using the CKD EPI equation. This calculation has not been validated in all clinical situations. eGFR's persistently <60 mL/min signify possible Chronic Kidney Disease.    Anion gap 16 (H) 5 - 15  CBC     Status: Abnormal   Collection Time: 04/23/15  2:46 PM  Result Value Ref Range   WBC 10.1 4.0 - 10.5 K/uL   RBC 4.18 (L) 4.22 - 5.81  MIL/uL   Hemoglobin 13.1 13.0 - 17.0 g/dL   HCT 40.7 39.0 - 52.0 %   MCV 97.4 78.0 - 100.0 fL   MCH 31.3 26.0 - 34.0 pg   MCHC 32.2 30.0 - 36.0 g/dL   RDW 14.2 11.5 - 15.5 %   Platelets 365 150 - 400 K/uL  Beta-hydroxybutyric acid     Status: None   Collection Time: 04/23/15  3:39 PM  Result Value Ref Range   Beta-Hydroxybutyric Acid 0.08 0.05 - 0.27 mmol/L  I-Stat venous blood gas, ED     Status: Abnormal   Collection Time: 04/23/15  3:56 PM  Result Value Ref Range   pH, Ven 7.379 (H) 7.250 - 7.300   pCO2, Ven 50.7 (H) 45.0 - 50.0 mmHg   pO2, Ven 24.0 (LL) 30.0 - 45.0 mmHg   Bicarbonate 30.0 (H) 20.0 - 24.0 mEq/L   TCO2 31 0 - 100 mmol/L   O2 Saturation 40.0 %   Acid-Base Excess 3.0 (H) 0.0 - 2.0 mmol/L   Patient temperature HIDE    Sample type VENOUS    Comment NOTIFIED PHYSICIAN   I-Stat CG4 Lactic Acid, ED     Status: Abnormal   Collection Time: 04/23/15  3:58 PM  Result Value Ref Range   Lactic Acid, Venous 2.75 (HH) 0.5 - 2.0 mmol/L   Comment NOTIFIED PHYSICIAN     Dg Foot Complete Left  04/23/2015  CLINICAL DATA:  Left great toe cellulitis and cyanosis.  Diabetes. EXAM: LEFT FOOT - COMPLETE 3+ VIEW COMPARISON:  None. FINDINGS: There is no evidence of fracture or dislocation. No evidence of osteolysis or periostitis. Old fracture deformity of the fifth metatarsal is noted. Prominent dorsal and plantar calcaneal bone spurs noted. Diffuse peripheral vascular calcification noted. IMPRESSION: No radiographic evidence of osteomyelitis or other acute findings. Electronically Signed   By: Earle Gell M.D.   On: 04/23/2015 18:10     Dwana Melena, MD 04/23/2015, [phone]  PM  

## 2015-04-23 NOTE — ED Notes (Signed)
Reports he does peritoneal dialysis, and makes no urine./

## 2015-04-23 NOTE — ED Notes (Signed)
Patient transported to X-ray 

## 2015-04-23 NOTE — Progress Notes (Signed)
Pt arrived to 5W28. Pt is alert and oriented X4. Pt oriented to room and instructed how to use the phone and call bell. Phone and call bell at patient bedside within in reach. Pt skin is dry and flaky. Pt has a blackened area with redness on left great toe with smaller blackened areas around the toenails on the right and left smaller toes. Pt has a peritoneal access site used for home dialysis in the mid abdomen. Peritoneal site is clean, dry, and intact.  Pt is resting and does not complain of any pain. Will continue to monitor per MD orders.

## 2015-04-24 ENCOUNTER — Encounter: Payer: Self-pay | Admitting: Podiatry

## 2015-04-24 ENCOUNTER — Inpatient Hospital Stay (HOSPITAL_COMMUNITY): Payer: Medicare Other

## 2015-04-24 DIAGNOSIS — I96 Gangrene, not elsewhere classified: Secondary | ICD-10-CM

## 2015-04-24 DIAGNOSIS — I25810 Atherosclerosis of coronary artery bypass graft(s) without angina pectoris: Secondary | ICD-10-CM

## 2015-04-24 DIAGNOSIS — I998 Other disorder of circulatory system: Secondary | ICD-10-CM

## 2015-04-24 DIAGNOSIS — G4730 Sleep apnea, unspecified: Secondary | ICD-10-CM

## 2015-04-24 DIAGNOSIS — I5032 Chronic diastolic (congestive) heart failure: Secondary | ICD-10-CM

## 2015-04-24 DIAGNOSIS — R1110 Vomiting, unspecified: Secondary | ICD-10-CM

## 2015-04-24 LAB — BODY FLUID CELL COUNT WITH DIFFERENTIAL: Total Nucleated Cell Count, Fluid: 1 cu mm (ref 0–1000)

## 2015-04-24 LAB — CBC
HEMATOCRIT: 35.6 % — AB (ref 39.0–52.0)
Hemoglobin: 11.9 g/dL — ABNORMAL LOW (ref 13.0–17.0)
MCH: 32.1 pg (ref 26.0–34.0)
MCHC: 33.4 g/dL (ref 30.0–36.0)
MCV: 96 fL (ref 78.0–100.0)
PLATELETS: 364 10*3/uL (ref 150–400)
RBC: 3.71 MIL/uL — AB (ref 4.22–5.81)
RDW: 14.1 % (ref 11.5–15.5)
WBC: 9.8 10*3/uL (ref 4.0–10.5)

## 2015-04-24 LAB — COMPREHENSIVE METABOLIC PANEL
ALT: 21 U/L (ref 17–63)
ANION GAP: 12 (ref 5–15)
AST: 32 U/L (ref 15–41)
Albumin: 2.2 g/dL — ABNORMAL LOW (ref 3.5–5.0)
Alkaline Phosphatase: 98 U/L (ref 38–126)
BUN: 45 mg/dL — ABNORMAL HIGH (ref 6–20)
CHLORIDE: 94 mmol/L — AB (ref 101–111)
CO2: 26 mmol/L (ref 22–32)
Calcium: 7.9 mg/dL — ABNORMAL LOW (ref 8.9–10.3)
Creatinine, Ser: 7.23 mg/dL — ABNORMAL HIGH (ref 0.61–1.24)
GFR calc Af Amer: 8 mL/min — ABNORMAL LOW (ref >60)
GFR, EST NON AFRICAN AMERICAN: 7 mL/min — AB (ref >60)
Glucose, Bld: 185 mg/dL — ABNORMAL HIGH (ref 65–99)
Potassium: 3 mmol/L — ABNORMAL LOW (ref 3.5–5.1)
SODIUM: 132 mmol/L — AB (ref 135–145)
Total Bilirubin: 0.5 mg/dL (ref 0.3–1.2)
Total Protein: 6.3 g/dL — ABNORMAL LOW (ref 6.5–8.1)

## 2015-04-24 LAB — HIV ANTIBODY (ROUTINE TESTING W REFLEX): HIV SCREEN 4TH GENERATION: NONREACTIVE

## 2015-04-24 LAB — TROPONIN I
Troponin I: 0.06 ng/mL — ABNORMAL HIGH (ref <0.031)
Troponin I: 0.09 ng/mL — ABNORMAL HIGH (ref <0.031)

## 2015-04-24 LAB — GLUCOSE, CAPILLARY
GLUCOSE-CAPILLARY: 123 mg/dL — AB (ref 65–99)
GLUCOSE-CAPILLARY: 151 mg/dL — AB (ref 65–99)
GLUCOSE-CAPILLARY: 215 mg/dL — AB (ref 65–99)
Glucose-Capillary: 214 mg/dL — ABNORMAL HIGH (ref 65–99)

## 2015-04-24 LAB — GRAM STAIN: Gram Stain: NONE SEEN

## 2015-04-24 LAB — HEMOGLOBIN A1C
Hgb A1c MFr Bld: 8.7 % — ABNORMAL HIGH (ref 4.8–5.6)
MEAN PLASMA GLUCOSE: 203 mg/dL

## 2015-04-24 MED ORDER — DEXTROSE 5 % IV SOLN
1.0000 g | INTRAVENOUS | Status: DC
  Administered 2015-04-24: 1 g via INTRAVENOUS
  Filled 2015-04-24 (×2): qty 1

## 2015-04-24 MED ORDER — METOCLOPRAMIDE HCL 10 MG PO TABS
10.0000 mg | ORAL_TABLET | Freq: Three times a day (TID) | ORAL | Status: AC
  Administered 2015-04-24 – 2015-04-27 (×13): 10 mg via ORAL
  Filled 2015-04-24 (×13): qty 1

## 2015-04-24 NOTE — Consult Note (Addendum)
Patient name: Jack Chung MRN: [id number] DOB: 11-05-1940 Sex: male  REASON FOR CONSULT: Left great toe eschar  HPI: Jack Chung is a 74 y.o. male, who has a three day history of worsening left great toe wound. This wound has been there for a bout a month. He reports it starting as a "brown spot" and worsening over the past three days. He denies any pain to his toe. He denies fever or chills. He denies any injuries to his toe. He denies any prior history of non-healing wounds. He denies a history of claudication or rest pain. He only ambulates about a block before having to stop due to being "winded" or his legs feeling weak. He ambulates with a cane. His wife recently suffered a stroke two weeks ago and the patient has put aside his medical care for her.   He was admitted on 04/23/15 for intractable nausea and vomiting. He has a PMH of insulin dependent diabetes mellitus, CAD s/p CABG x 5 (2011), hypertension, ESRD on PD. He has never been a smoker.   On ROS, he denies any fever or chills or abdominal pain. He denies any nausea or vomiting right now. He denies chest discomfort or shortness of breath. Full ROS below.   Past Medical History  Diagnosis Date  . Hypertension   . Diabetes mellitus   . Hypothyroidism   . CHF (congestive heart failure) ([place])   . Anemia   . Blind left eye   . Sleep apnea     not on CPAP  . Coronary artery disease     CABG x 11 Jun 2009.  MRSA infections of incsions  . Arthritis   . Sarcoidosis ([place])     Hx:of  . Diabetic retinopathy ([place])     Hx: of bilateral  . Edema   . Hyperlipidemia   . 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 Atherosclerosis of native arteries of the extremities, unspecified   . Eczema     Hx: of  . Insomnia   . Seasonal allergies   . ESRD on dialysis [facility])     Was on dialysis in July 2011 and then stopped and restarted May 2014. He then transitioned to PD in Aug 2014. He does PD on cycler when at home  and CAPD 6 exchanges per day when travelling. His wife is "in charge" and does the exchanges.   . History of blood transfusion     with heart surgery  . Coronary atherosclerosis of native coronary artery   . Atherosclerosis of native arteries of the extremities, unspecified     Family History  Problem Relation Age of Onset  . COPD Mother   . COPD Father     SOCIAL HISTORY: Social History   Social History  . Marital Status: Married    Spouse Name: N/A  . Number of Children: N/A  . Years of Education: N/A   Occupational History  . Not on file.   Social History Main Topics  . Smoking status: Never Smoker   . Smokeless tobacco: Never Used  . Alcohol Use: No  . Drug Use: No  . Sexual Activity: Not on file   Other Topics Concern  . Not on file   Social History Narrative   From [place] here a yr ago   Has 75 kids-eldest son in town    Allergies  Allergen Reactions  . Flu Virus Vaccine  Gets the flu, patient has bad reaction to the flu vaccine.  . Lipitor [Atorvastatin]     Leg cramps    Current Facility-Administered Medications  Medication Dose Route Frequency Provider Last Rate Last Dose  . 0.9 %  sodium chloride infusion  250 mL Intravenous PRN Reyne Dumas, MD      . acetaminophen (TYLENOL) tablet 650 mg  650 mg Oral Q6H PRN Reyne Dumas, MD       Or  . acetaminophen (TYLENOL) suppository 650 mg  650 mg Rectal Q6H PRN Reyne Dumas, MD      . allopurinol (ZYLOPRIM) tablet 100 mg  100 mg Oral Daily Reyne Dumas, MD   100 mg at 04/24/15 1002  . aspirin tablet 325 mg  325 mg Oral Daily Reyne Dumas, MD   325 mg at 04/24/15 1002  . cefTAZidime (FORTAZ) 1 g in dextrose 5 % 50 mL IVPB  1 g Intravenous Q24H Barton Dubois, MD      . dialysis solution 1.5% low-MG/low-CA dianeal solution   Intraperitoneal 5 X Daily Dwana Melena, MD   3,000 mL at 04/23/15 2200  . heparin 1000 unit/ml injection 500 Units  500 Units Intraperitoneal PRN Dwana Melena, MD        . heparin injection 5,000 Units  5,000 Units Subcutaneous 3 times per day Reyne Dumas, MD   5,000 Units at 04/24/15 1344  . insulin aspart (novoLOG) injection 0-9 Units  0-9 Units Subcutaneous TID WC Reyne Dumas, MD   3 Units at 04/24/15 1728  . insulin glargine (LANTUS) injection 40 Units  40 Units Subcutaneous QHS Reyne Dumas, MD   40 Units at 04/23/15 2200  . levalbuterol (XOPENEX) nebulizer solution 0.63 mg  0.63 mg Nebulization Q6H PRN Reyne Dumas, MD      . levothyroxine (SYNTHROID, LEVOTHROID) tablet 112 mcg  112 mcg Oral QAC breakfast Reyne Dumas, MD   112 mcg at 04/24/15 0806  . metoCLOPramide (REGLAN) tablet 10 mg  10 mg Oral TID AC & HS Barton Dubois, MD   10 mg at 04/24/15 1613  . midodrine (PROAMATINE) tablet 10 mg  10 mg Oral TID Reyne Dumas, MD   10 mg at 04/24/15 1728  . ondansetron (ZOFRAN) tablet 4 mg  4 mg Oral Q6H PRN Reyne Dumas, MD       Or  . ondansetron (ZOFRAN) injection 4 mg  4 mg Intravenous Q6H PRN Reyne Dumas, MD      . oxyCODONE (Oxy IR/ROXICODONE) immediate release tablet 5 mg  5 mg Oral Q4H PRN Reyne Dumas, MD      . pantoprazole (PROTONIX) EC tablet 40 mg  40 mg Oral Daily Reyne Dumas, MD   40 mg at 04/24/15 1002  . sodium chloride 0.9 % injection 3 mL  3 mL Intravenous Q12H Reyne Dumas, MD   3 mL at 04/24/15 1004  . sodium chloride 0.9 % injection 3 mL  3 mL Intravenous Q12H Reyne Dumas, MD   3 mL at 04/24/15 1004  . sodium chloride 0.9 % injection 3 mL  3 mL Intravenous PRN Reyne Dumas, MD        REVIEW OF SYSTEMS:  [X]  denotes positive finding, [ ]  denotes negative finding Cardiac  Comments:  Chest pain or chest pressure:    Shortness of breath upon exertion:    Short of breath when lying flat:    Irregular heart rhythm:        Vascular    Pain in calf, thigh,  or hip brought on by ambulation:    Pain in feet at night that wakes you up from your sleep:     Blood clot in your veins:    Leg swelling:         Pulmonary    Oxygen at home:     Productive cough:     Wheezing:         Neurologic    Sudden weakness in arms or legs:     Sudden numbness in arms or legs:     Sudden onset of difficulty speaking or slurred speech:    Temporary loss of vision in one eye:     Problems with dizziness:         Gastrointestinal    Blood in stool:     Vomited blood:         Genitourinary    Burning when urinating:     Blood in urine:        Psychiatric    Major depression:         Hematologic    Bleeding problems:    Problems with blood clotting too easily:        Skin    Rashes or ulcers:        Constitutional    Fever or chills:      PHYSICAL EXAM: Filed Vitals:   04/23/15 2054 04/24/15 0440 04/24/15 0454 04/24/15 0945  BP: 133/60  101/56 104/59  Pulse: 84  56 84  Temp: 97.5 F (36.4 C)  98.7 F (37.1 C) 98.4 F (36.9 C)  TempSrc: Oral  Oral Oral  Resp: 17  18 18   Height: 5\' 10"  (1.778 m)     Weight: 228 lb 13.4 oz (103.8 kg) 228 lb 13.4 oz (103.8 kg)    SpO2: 100%  98% 97%    GENERAL: The patient is a well-nourished male, in no acute distress. The vital signs are documented above. CARDIAC: There is a regular rate and rhythm.  VASCULAR: no carotid bruits, 2+ femoral pulses bilaterally, palpable right popliteal pulse. Brisk biphasic PT and DP doppler flow bilaterally, no palpable right radial pulse, weakly palpable brachial pulse PULMONARY: There is good air exchange bilaterally without wheezing or rales. ABDOMEN: Soft and non-tender with normal pitched bowel sounds, obese, pannus obscures palpable aorta MUSCULOSKELETAL: Left great toe black dry eschar at tip. The toe is erythematous. No drainage. No cellulitis of foot. There are no major deformities or cyanosis. NEUROLOGIC: No focal weakness or paresthesias are detected. Decreased sensation to feet bilaterally.  SKIN: There are no ulcers or rashes noted. PSYCHIATRIC: The patient has a normal affect. HEAD: [place]/AT ENT: oropharynx and nasopharynx clear without  drainage, hearing grossly intact LYMPH: no obvious palpable cervical or inguinal LAD  Laboratory: CBC:    Component Value Date/Time   WBC 9.8 04/24/2015 0550   RBC 3.71* 04/24/2015 0550   RBC 3.34* 10/05/2012 0340   HGB 11.9* 04/24/2015 0550   HCT 35.6* 04/24/2015 0550   PLT 364 04/24/2015 0550   MCV 96.0 04/24/2015 0550   MCH 32.1 04/24/2015 0550   MCHC 33.4 04/24/2015 0550   RDW 14.1 04/24/2015 0550   LYMPHSABS 1.5 07/17/2013 0724   MONOABS 1.2* 07/17/2013 0724   EOSABS 0.2 07/17/2013 0724   BASOSABS 0.0 07/17/2013 0724    BMP:    Component Value Date/Time   NA 132* 04/24/2015 0550   K 3.0* 04/24/2015 0550   CL 94* 04/24/2015 0550   CO2 26 04/24/2015 0550  GLUCOSE 185* 04/24/2015 0550   BUN 45* 04/24/2015 0550   CREATININE 7.23* 04/24/2015 0550   CREATININE 4.14* 01/30/2014 1328   CALCIUM 7.9* 04/24/2015 0550   GFRNONAA 7* 04/24/2015 0550   GFRAA 8* 04/24/2015 0550    Coagulation: Lab Results  Component Value Date   INR 0.95 01/30/2014   INR 1.12 12/01/2012   INR 1.10 10/31/2012   No results found for: PTT  Lipids: No results found for: CHOL, TRIG, HDL, CHOLHDL, VLDL, LDLCALC, LDLDIRECT   Radiology: Ct Abdomen Pelvis Wo Contrast  04/23/2015  CLINICAL DATA:  Subacute onset of postprandial vomiting. Initial encounter. EXAM: CT ABDOMEN AND PELVIS WITHOUT CONTRAST TECHNIQUE: Multidetector CT imaging of the abdomen and pelvis was performed following the standard protocol without IV contrast. COMPARISON:  CT of the abdomen and pelvis from 07/17/2013, and abdominal ultrasound performed 04/21/2015 FINDINGS: Scattered nodules at the right lung base demonstrate mild calcification and are likely benign. Wall thickening along the distal esophagus could reflect esophagitis. Would correlate for any associated symptoms. Diffuse coronary artery calcifications are seen. Small to moderate volume ascites is seen within the abdomen and pelvis. A peritoneal dialysis catheter is  noted at the left hemipelvis. Mild vague soft tissue inflammation is noted within the mesentery, nonspecific in appearance. The liver and spleen are unremarkable in appearance. The gallbladder is within normal limits. The pancreas and adrenal glands are unremarkable. The kidneys are unremarkable in appearance. There is no evidence of hydronephrosis. No renal or ureteral stones are seen. Nonspecific perinephric stranding is noted bilaterally. No free fluid is identified. The small bowel is unremarkable in appearance. The stomach is within normal limits. No acute vascular abnormalities are seen. Scattered calcification is noted along the abdominal aorta and its branches. The appendix is normal in caliber, without evidence of appendicitis. The colon is unremarkable in appearance. The bladder is relatively decompressed and grossly unremarkable in appearance. The prostate remains normal in size. No inguinal lymphadenopathy is seen. No acute osseous abnormalities are identified. IMPRESSION: 1. Mild wall thickening along the distal esophagus could reflect esophagitis. Would correlate for associated symptoms. 2. Small to moderate volume ascites within the abdomen and pelvis. Peritoneal dialysis catheter noted at the right hemipelvis. 3. Mild vague nonspecific soft tissue inflammation noted within the mesentery. 4. Scattered calcification along the abdominal aorta and its branches. 5. Diffuse coronary artery calcifications seen. 6. Scattered nodules at the right lung base demonstrate mild calcification and are likely benign. Electronically Signed   By: Garald Balding M.D.   On: 04/23/2015 19:31   Dg Chest 2 View  04/23/2015  CLINICAL DATA:  Acute onset of shortness of breath. Initial encounter. EXAM: CHEST  2 VIEW COMPARISON:  Chest radiograph from 12/30/2014 FINDINGS: The lungs are well-aerated and clear. There is no evidence of focal opacification, pleural effusion or pneumothorax. The heart is borderline enlarged. The  patient is status post median sternotomy. No acute osseous abnormalities are seen. IMPRESSION: No acute cardiopulmonary process seen. Electronically Signed   By: Garald Balding M.D.   On: 04/23/2015 19:01   Dg Esophagus  04/24/2015  CLINICAL DATA:  Dysphagia with lower chest pressure when eating. Initial encounter. EXAM: ESOPHOGRAM/BARIUM SWALLOW TECHNIQUE: Single contrast examination was performed using  thin barium. FLUOROSCOPY TIME:  Radiation Exposure Index (as provided by the fluoroscopic device): If the device does not provide the exposure index: Fluoroscopy Time:  1 minute and 6 seconds Number of Acquired Images:  All images acquired with fluoro store. COMPARISON:  Abdominal CT 04/23/2015. FINDINGS:  Study was performed in the supine, semi erect and right lateral decubitus positions due to the patient's limited mobility. The esophageal motility is within normal limits. No mucosal ulceration, stricture or mass identified. No laryngeal penetration or cervical esophageal abnormality identified. No significant gastroesophageal reflux was elicited with the water siphon test. A 13 mm barium tablet was administered. This was retained in the distal esophagus for approximately 2 minutes. With the aid of thin barium and applesauce, this tablet did eventually pass into the stomach. IMPRESSION: 1. No significant findings identified. No evidence of esophagitis or significant stricture. 2. Barium tablet was temporarily retained within the distal esophagus, although did pass into the stomach. Electronically Signed   By: Richardean Sale M.D.   On: 04/24/2015 09:51   Dg Foot Complete Left  04/23/2015  CLINICAL DATA:  Left great toe cellulitis and cyanosis.  Diabetes. EXAM: LEFT FOOT - COMPLETE 3+ VIEW COMPARISON:  None. FINDINGS: There is no evidence of fracture or dislocation. No evidence of osteolysis or periostitis. Old fracture deformity of the fifth metatarsal is noted. Prominent dorsal and plantar calcaneal bone  spurs noted. Diffuse peripheral vascular calcification noted. IMPRESSION: No radiographic evidence of osteomyelitis or other acute findings. Electronically Signed   By: Earle Gell M.D.   On: 04/23/2015 18:10      MEDICAL ISSUES:  Left great toe eschar X-rays negative for osteomyelitis. Blood cultures pending.  Orthopedics consulted and recommending toe amputation. Brisk biphasic doppler flow to left DP and PT. Given doppler exam, patient will likely heal toe amputation. Obtain baseline ABIs. Dr. Bridgett Larsson to see and make further recommends. May need arteriogram.    Alvia Grove, PA-C [facility] Pager: [phone]   - Addendum  I have independently interviewed and examined the patient, and I agree with the physician assistant's findings.  On exam, the distal great toe looks like dry gangrene vs. traumatic injury.  Given the lack of MOI, I suspect this is ischemic disease.  On foot x-ray, calcific atherosclerosis outlines distal posterior tibial artery and anterior tibial artery, extending into tarsal branches.  Subsequently, it is almost certain that this patient already has digital artery disease.  I doubt that revascularization is going to be possible.  I would obtain BLE ABI with TBI.  Even with medial calcification, the TBI frequently remain accurate, though I have seen cases where TBI are also impossible to obtain due to calcification.  If the TBI is sufficiently high, any amputation will likely heal.    Given the limited value of non-invasive studies in diabetics, angiography is considered the gold standard diagnostic test in diabetic patients.  I have offered the patient: diagnostic aortogram and bilateral leg runoff.  I discussed with the patient the nature of angiographic procedures, especially the limited patencies of any endovascular intervention.  The patient is aware of that the risks of an angiographic procedure include but are not limited to:  bleeding, infection, access site complications, renal failure, embolization, rupture of vessel, dissection, possible need for emergent surgical intervention, possible need for surgical procedures to treat the patient's pathology, anaphylactic reaction to contrast, and stroke and death.  The patient does not appear particularly interested at this point.  - ABI with TBI - Angiogram w/ B leg runoff if patient agrees on Monday.  Adele Barthel, MD [facility] [place] Office: [phone] Pager: [phone]  04/24/2015, 7:07 PM

## 2015-04-24 NOTE — Progress Notes (Signed)
ANTIBIOTIC CONSULT NOTE Pharmacy Consult for vancomycin + ceftazidime  Indication: wound infection  Allergies  Allergen Reactions  . Flu Virus Vaccine     Gets the flu, patient has bad reaction to the flu vaccine.  . Lipitor [Atorvastatin]     Leg cramps    Patient Measurements: Height: 5\' 10"  (177.8 cm) Weight: 228 lb 13.4 oz (103.8 kg) IBW/kg (Calculated) : 73  Vital Signs: Temp: 98.4 F (36.9 C) (11/17 0945) Temp Source: Oral (11/17 0945) BP: 104/59 mmHg (11/17 0945) Pulse Rate: 84 (11/17 0945) Intake/Output from previous day: 11/16 0701 - 11/17 0700 In: 2500  Out: 2900  Intake/Output from this shift: Total I/O In: 5480 [P.O.:480; Other:5000] Out: 1400 [Other:1400]  Labs:  Recent Labs  04/23/15 1446 04/23/15 2130 04/24/15 0550  WBC 10.1  --  9.8  HGB 13.1  --  11.9*  PLT 365  --  364  CREATININE 7.17* 7.20* 7.23*   Estimated Creatinine Clearance: 11.3 mL/min (by C-G formula based on Cr of 7.23). No results for input(s): VANCOTROUGH, VANCOPEAK, VANCORANDOM, GENTTROUGH, GENTPEAK, GENTRANDOM, TOBRATROUGH, TOBRAPEAK, TOBRARND, AMIKACINPEAK, AMIKACINTROU, AMIKACIN in the last 72 hours.   Microbiology: Recent Results (from the past 720 hour(s))  Culture, body fluid-bottle     Status: None (Preliminary result)   Collection Time: 04/23/15 11:45 PM  Result Value Ref Range Status   Specimen Description FLUID PERITONEAL  Final   Special Requests BOTTLES DRAWN AEROBIC AND ANAEROBIC 10CC  Final   Culture PENDING  Incomplete   Report Status PENDING  Incomplete  Gram stain     Status: None   Collection Time: 04/23/15 11:45 PM  Result Value Ref Range Status   Specimen Description FLUID PERITONEAL  Final   Special Requests NONE  Final   Gram Stain NO WBC SEEN NO ORGANISMS SEEN   Final   Report Status 04/24/2015 FINAL  Final    Medical History: Past Medical History  Diagnosis Date  . Hypertension   . Diabetes mellitus   . Hypothyroidism   . CHF (congestive  heart failure) ([place])   . Anemia   . Blind left eye   . Sleep apnea     not on CPAP  . Coronary artery disease     CABG x 11 Jun 2009.  MRSA infections of incsions  . Arthritis   . Sarcoidosis ([place])     Hx:of  . Diabetic retinopathy ([place])     Hx: of bilateral  . Edema   . Hyperlipidemia   . 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 Atherosclerosis of native arteries of the extremities, unspecified   . Eczema     Hx: of  . Insomnia   . Seasonal allergies   . ESRD on dialysis [facility])     Was on dialysis in July 2011 and then stopped and restarted May 2014. He then transitioned to PD in Aug 2014. He does PD on cycler when at home and CAPD 6 exchanges per day when travelling. His wife is "in charge" and does the exchanges.   . History of blood transfusion     with heart surgery  . Coronary atherosclerosis of native coronary artery   . Atherosclerosis of native arteries of the extremities, unspecified     Assessment: 74 yo m presenting to the ED on 11/16 with nausea and vomiting x 1 month.  Patient has been on clindamycin and cipro for the last month for left great toe discoloration and drainage, but he has  not been able to keep any of the abx down. Patient is to be admitted to receive IV abx.  Patient is on PD at home and is anuric, no signs to peritonitis. Wbc normal, afebrile.   11/16 >> Vanc 11/16 >> Ceftazidime  11/16 BCx: pending 11/16 Peritoneal body fluid: no organisms  Goal of Therapy:  Vancomycin trough level 10-15 mcg/ml  Plan:  --Obtain VR on 11/18 AM and redose --Continue Ceftazidime 1 gm IV q24h --F/u plans for PD while inpatient to determine further dosing --Monitor cx, cbc, clincal course  Viann Fish PharmD candidate  04/24/2015 11:44 AM

## 2015-04-24 NOTE — Progress Notes (Signed)
New Admission Note  Arrival: Arrived unit via stretcher from 5W Mental Orientation: Alert & oriented x4 Telemetry: Assessment: On tele Skin: Left big toe gangerene verified by 2nd RN Becca IV: Left forearm Pain: No c/o of pain Safety measures:  verbalized understanding. Bed in lowest position. Yellow bracelet on. Non-skid socks on. Bed alarm on. Family:  No family at bedside Orders have been reviewed and implemented. Will continue to monitor.

## 2015-04-24 NOTE — Progress Notes (Signed)
[facility] PROGRESS NOTE  Jack Chung [id number] DOB: 04-Oct-1940 DOA: 04/23/2015 PCP: [name], MD  Assessment/Plan: Intractable nausea vomiting Likely an exacerbation of gastroparesis vs under-dialysis vs infection (left great toe gangrene  Per renal rec's will check gastric emptying study  Will increase reglan to QID 10 mg and follow clinical response Minimize narcotics Esophagogram w/o strictures, but positive delay transit Will continue PPI  Gangrene of the left great toe-blood culture 2, lactate mildly elevated on admission x-rays of the left toe w/o signs of osteomyelitis Ortho recommending amputation, but due to PVD, high risk for not healing Vascular surgery consulted Will continue current antibiotics for now  History of orthostatic hypotension-continue midodrine  Uncontrolled diabetes: with A1C 8.7 Will continue SSI and lantus  Hypokalemia to be corrected by nephrology  Chronic CHF (congestive heart failure) (HCC)-patient without any signs and symptoms of fluid overload of exacerbation, most recent EF 0000000, diastolic in nature will follow low sodium diet daily weights Strict intake and output  Sarcoidosis (HCC)-previously on prednisone  Sleep apnea Not on CPAP Will monitor for need of O2 supplementation  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 -low calorie diet and increase physical activity discussed with patient   Gout-continue allopurinol  CAD (coronary artery disease)-multivessel coronary artery disease, medical therapy recommended, followed by [name], status post 3 vessel CABG in 2011 -no CP and no SOB -troponin with flat elevation at 0.09 and no suggesting ACS; most likely due to chronic renal failure  Code Status: Full Family Communication: wife at bedside  Disposition Plan: to be determine; patient still nauseous and with ongoing  changes on his left great toe that might require amputation. Continue antibiotics, follow vascular and ortho rec's     Consultants:  Renal service  Orthopedic service   Vascular surgery  Procedures:  See below for x-ray reports   Antibiotics:  vanc and fortaz 11/16  HPI/Subjective: Afebrile, no CP and no SOB. Patient denies further vomiting, but is still nauseous   Objective: Filed Vitals:   04/24/15 0945  BP: 104/59  Pulse: 84  Temp: 98.4 F (36.9 C)  Resp: 18    Intake/Output Summary (Last 24 hours) at 04/24/15 1611 Last data filed at 04/24/15 1408  Gross per 24 hour  Intake  10980 ml  Output   7000 ml  Net   3980 ml   Filed Weights   04/23/15 1939 04/23/15 2054 04/24/15 0440  Weight: 103.5 kg (228 lb 2.8 oz) 103.8 kg (228 lb 13.4 oz) 103.8 kg (228 lb 13.4 oz)    Exam:   General:  Still nauseous, but denies further vomiting. No CP or SOB   Cardiovascular: S1 and S2, no rubs or gallops  Respiratory: good air movement, no wheezing  Abdomen: soft, NT, ND, positive BS  Musculoskeletal: no swelling, LLE with necrotic changes and scar on tip of his toe; denies pain and no discharges appreciated  Data Reviewed: Basic Metabolic Panel:  Recent Labs Lab 04/23/15 1446 04/23/15 1810 04/23/15 2130 04/24/15 0550  NA 131*  --   --  132*  K 3.0*  --   --  3.0*  CL 91*  --   --  94*  CO2 24  --   --  26  GLUCOSE 274*  --   --  185*  BUN 44*  --   --  45*  CREATININE 7.17*  --  7.20* 7.23*  CALCIUM 8.8*  --   --  7.9*  MG  --  1.8  --   --    Liver Function Tests:  Recent Labs Lab 04/23/15 1446 04/24/15 0550  AST 36 32  ALT 26 21  ALKPHOS 128* 98  BILITOT 0.5 0.5  PROT 7.7 6.3*  ALBUMIN 2.7* 2.2*    Recent Labs Lab 04/23/15 1446  LIPASE 29   CBC:  Recent Labs Lab 04/23/15 1446 04/24/15 0550  WBC 10.1 9.8  HGB 13.1 11.9*  HCT 40.7 35.6*  MCV 97.4 96.0  PLT 365 364   Cardiac Enzymes:  Recent Labs Lab 04/23/15 2130  04/23/15 2330 04/24/15 0550  TROPONINI 0.07* 0.06* 0.09*   CBG:  Recent Labs Lab 04/23/15 2053 04/24/15 0801 04/24/15 1102  GLUCAP 320* 123* 151*    Recent Results (from the past 240 hour(s))  Culture, blood (routine x 2)     Status: None (Preliminary result)   Collection Time: 04/23/15  3:39 PM  Result Value Ref Range Status   Specimen Description BLOOD LEFT ARM  Final   Special Requests IN PEDIATRIC BOTTLE 3CC  Final   Culture NO GROWTH < 24 HOURS  Final   Report Status PENDING  Incomplete  Culture, blood (routine x 2)     Status: None (Preliminary result)   Collection Time: 04/23/15  9:30 PM  Result Value Ref Range Status   Specimen Description BLOOD LEFT HAND  Final   Special Requests BOTTLES DRAWN AEROBIC ONLY 5CC  Final   Culture NO GROWTH < 24 HOURS  Final   Report Status PENDING  Incomplete  Culture, body fluid-bottle     Status: None (Preliminary result)   Collection Time: 04/23/15 11:45 PM  Result Value Ref Range Status   Specimen Description FLUID PERITONEAL  Final   Special Requests BOTTLES DRAWN AEROBIC AND ANAEROBIC 10CC  Final   Culture PENDING  Incomplete   Report Status PENDING  Incomplete  Gram stain     Status: None   Collection Time: 04/23/15 11:45 PM  Result Value Ref Range Status   Specimen Description FLUID PERITONEAL  Final   Special Requests NONE  Final   Gram Stain NO WBC SEEN NO ORGANISMS SEEN   Final   Report Status 04/24/2015 FINAL  Final     Studies: Ct Abdomen Pelvis Wo Contrast  04/23/2015  CLINICAL DATA:  Subacute onset of postprandial vomiting. Initial encounter. EXAM: CT ABDOMEN AND PELVIS WITHOUT CONTRAST TECHNIQUE: Multidetector CT imaging of the abdomen and pelvis was performed following the standard protocol without IV contrast. COMPARISON:  CT of the abdomen and pelvis from 07/17/2013, and abdominal ultrasound performed 04/21/2015 FINDINGS: Scattered nodules at the right lung base demonstrate mild calcification and are likely  benign. Wall thickening along the distal esophagus could reflect esophagitis. Would correlate for any associated symptoms. Diffuse coronary artery calcifications are seen. Small to moderate volume ascites is seen within the abdomen and pelvis. A peritoneal dialysis catheter is noted at the left hemipelvis. Mild vague soft tissue inflammation is noted within the mesentery, nonspecific in appearance. The liver and spleen are unremarkable in appearance. The gallbladder is within normal limits. The pancreas and adrenal glands are unremarkable. The kidneys are unremarkable in appearance. There is no evidence of hydronephrosis. No renal or ureteral stones are seen. Nonspecific perinephric stranding is noted bilaterally. No free fluid is identified. The small bowel is unremarkable in appearance. The stomach is within normal limits. No acute vascular abnormalities are seen. Scattered calcification  is noted along the abdominal aorta and its branches. The appendix is normal in caliber, without evidence of appendicitis. The colon is unremarkable in appearance. The bladder is relatively decompressed and grossly unremarkable in appearance. The prostate remains normal in size. No inguinal lymphadenopathy is seen. No acute osseous abnormalities are identified. IMPRESSION: 1. Mild wall thickening along the distal esophagus could reflect esophagitis. Would correlate for associated symptoms. 2. Small to moderate volume ascites within the abdomen and pelvis. Peritoneal dialysis catheter noted at the right hemipelvis. 3. Mild vague nonspecific soft tissue inflammation noted within the mesentery. 4. Scattered calcification along the abdominal aorta and its branches. 5. Diffuse coronary artery calcifications seen. 6. Scattered nodules at the right lung base demonstrate mild calcification and are likely benign. Electronically Signed   By: Garald Balding M.D.   On: 04/23/2015 19:31   Dg Chest 2 View  04/23/2015  CLINICAL DATA:  Acute  onset of shortness of breath. Initial encounter. EXAM: CHEST  2 VIEW COMPARISON:  Chest radiograph from 12/30/2014 FINDINGS: The lungs are well-aerated and clear. There is no evidence of focal opacification, pleural effusion or pneumothorax. The heart is borderline enlarged. The patient is status post median sternotomy. No acute osseous abnormalities are seen. IMPRESSION: No acute cardiopulmonary process seen. Electronically Signed   By: Garald Balding M.D.   On: 04/23/2015 19:01   Dg Esophagus  04/24/2015  CLINICAL DATA:  Dysphagia with lower chest pressure when eating. Initial encounter. EXAM: ESOPHOGRAM/BARIUM SWALLOW TECHNIQUE: Single contrast examination was performed using  thin barium. FLUOROSCOPY TIME:  Radiation Exposure Index (as provided by the fluoroscopic device): If the device does not provide the exposure index: Fluoroscopy Time:  1 minute and 6 seconds Number of Acquired Images:  All images acquired with fluoro store. COMPARISON:  Abdominal CT 04/23/2015. FINDINGS: Study was performed in the supine, semi erect and right lateral decubitus positions due to the patient's limited mobility. The esophageal motility is within normal limits. No mucosal ulceration, stricture or mass identified. No laryngeal penetration or cervical esophageal abnormality identified. No significant gastroesophageal reflux was elicited with the water siphon test. A 13 mm barium tablet was administered. This was retained in the distal esophagus for approximately 2 minutes. With the aid of thin barium and applesauce, this tablet did eventually pass into the stomach. IMPRESSION: 1. No significant findings identified. No evidence of esophagitis or significant stricture. 2. Barium tablet was temporarily retained within the distal esophagus, although did pass into the stomach. Electronically Signed   By: Richardean Sale M.D.   On: 04/24/2015 09:51   Dg Foot Complete Left  04/23/2015  CLINICAL DATA:  Left great toe cellulitis  and cyanosis.  Diabetes. EXAM: LEFT FOOT - COMPLETE 3+ VIEW COMPARISON:  None. FINDINGS: There is no evidence of fracture or dislocation. No evidence of osteolysis or periostitis. Old fracture deformity of the fifth metatarsal is noted. Prominent dorsal and plantar calcaneal bone spurs noted. Diffuse peripheral vascular calcification noted. IMPRESSION: No radiographic evidence of osteomyelitis or other acute findings. Electronically Signed   By: Earle Gell M.D.   On: 04/23/2015 18:10    Scheduled Meds: . allopurinol  100 mg Oral Daily  . aspirin  325 mg Oral Daily  . cefTAZidime (FORTAZ)  IV  1 g Intravenous Q24H  . dialysis solution 1.5% low-MG/low-CA   Intraperitoneal 5 X Daily  . heparin  5,000 Units Subcutaneous 3 times per day  . insulin aspart  0-9 Units Subcutaneous TID WC  . insulin glargine  40 Units Subcutaneous QHS  . levothyroxine  112 mcg Oral QAC breakfast  . metoCLOPramide  10 mg Oral TID AC & HS  . midodrine  10 mg Oral TID  . pantoprazole  40 mg Oral Daily  . sodium chloride  3 mL Intravenous Q12H  . sodium chloride  3 mL Intravenous Q12H   Continuous Infusions:   Principal Problem:   Intractable nausea and vomiting Active Problems:   CHF (congestive heart failure) (HCC)   Sarcoidosis (HCC)   Sleep apnea   ESRD on dialysis ([place])   Gout   CAD (coronary artery disease)   Orthostatic hypotension    Time spent: 35 minutes     Barton Dubois  [facility] Pager [phone]. If 7PM-7AM, please contact night-coverage at [email or web address], password [facility] 04/24/2015, 4:11 PM  LOS: 1 day

## 2015-04-24 NOTE — Consult Note (Signed)
ORTHOPAEDIC CONSULTATION  REQUESTING PHYSICIAN: Barton Dubois, MD  Chief Complaint: Left great toe ulcer  HPI: Jack Chung is a 74 y.o. male who presents with left great toe ulcer for a few days.  Denies pain.  Started as a blister.  Patient has multiple medical problems.  He has been seeing podiatrist and placed on po cipro and clinda but unable to keep down due to n/v.  Denies f/c.  Pain does not radiate.  No worsening or alleviating features.  Past Medical History  Diagnosis Date  . Hypertension   . Diabetes mellitus   . Hypothyroidism   . CHF (congestive heart failure) ([place])   . Anemia   . Blind left eye   . Sleep apnea     not on CPAP  . Coronary artery disease     CABG x 11 Jun 2009.  MRSA infections of incsions  . Arthritis   . Sarcoidosis ([place])     Hx:of  . Diabetic retinopathy ([place])     Hx: of bilateral  . Edema   . Hyperlipidemia   . 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 Atherosclerosis of native arteries of the extremities, unspecified   . Eczema     Hx: of  . Insomnia   . Seasonal allergies   . ESRD on dialysis [facility])     Was on dialysis in July 2011 and then stopped and restarted May 2014. He then transitioned to PD in Aug 2014. He does PD on cycler when at home and CAPD 6 exchanges per day when travelling. His wife is "in charge" and does the exchanges.   . History of blood transfusion     with heart surgery  . Coronary atherosclerosis of native coronary artery   . Atherosclerosis of native arteries of the extremities, unspecified    Past Surgical History  Procedure Laterality Date  . Cardiac surgery      total of 6 surgeries, 5 related to mrsa  . Coronary artery bypass graft  06/2009  . Cataract surgery      Hx: of  . Debridements      Hx: of secondary to MRSA  . Av fistula placement Right 10/11/2012    Procedure: ARTERIOVENOUS (AV) FISTULA CREATION;  Surgeon: Rosetta Posner, MD;  Location: [place];  Service: Vascular;   Laterality: Right;  . Insertion of dialysis catheter Left   . Capd insertion N/A 11/28/2012    Procedure: LAPAROSCOPIC PERITONEAL DIALYSIS CATHETER PLACEMENT;  Surgeon: Ralene Ok, MD;  Location: [place];  Service: General;  Laterality: N/A;  . Left heart catheterization with coronary/graft angiogram N/A 01/31/2014    Procedure: LEFT HEART CATHETERIZATION WITH Beatrix Fetters;  Surgeon: Troy Sine, MD;  Location[facility] C[facility] LAB;  Service: Cardiovascular;  Laterality: N/A;   Social History   Social History  . Marital Status: Married    Spouse Name: N/A  . Number of Children: N/A  . Years of Education: N/A   Social History Main Topics  . Smoking status: Never Smoker   . Smokeless tobacco: Never Used  . Alcohol Use: No  . Drug Use: No  . Sexual Activity: Not Asked   Other Topics Concern  . None   Social History Narrative   From [place] here a yr ago   Has 53 kids-eldest son in town   Family History  Problem Relation Age of Onset  . COPD Mother   . COPD  Father    - negative except otherwise stated in the family history section Allergies  Allergen Reactions  . Flu Virus Vaccine     Gets the flu, patient has bad reaction to the flu vaccine.  . Lipitor [Atorvastatin]     Leg cramps   Prior to Admission medications   Medication Sig Start Date End Date Taking? Authorizing Provider  allopurinol (ZYLOPRIM) 100 MG tablet Take 100 mg by mouth daily.   Yes Historical Provider, MD  aspirin 325 MG tablet Take 325 mg by mouth daily.   Yes Historical Provider, MD  calcium carbonate (TUMS - DOSED IN MG ELEMENTAL CALCIUM) 500 MG chewable tablet Chew 2 tablets by mouth 3 (three) times daily with meals.    Yes Historical Provider, MD  cetirizine (ZYRTEC) 10 MG tablet Take 10 mg by mouth daily.   Yes Historical Provider, MD  ciprofloxacin (CIPRO) 500 MG tablet Take 1 tablet (500 mg total) by mouth daily. 04/22/15  Yes Trula Slade, DPM  clindamycin (CLEOCIN)  300 MG capsule Take 1 capsule (300 mg total) by mouth 3 (three) times daily. 04/22/15  Yes Trula Slade, DPM  insulin aspart (NOVOLOG) 100 UNIT/ML injection Inject 10-15 Units into the skin 3 (three) times daily with meals. Take none if blood sugar < 150, 151-200 2 units, 201-250 4 units, 251-300 6 units, 301-350 8 units   Yes Historical Provider, MD  Insulin Glargine (LANTUS) 100 UNIT/ML Solostar Pen Inject 45 units into the skin daily. Dx 250.00 Patient taking differently: 40 Units by Subconjunctival route at bedtime.  07/26/13  Yes Lauree Chandler, NP  lactulose (CHRONULAC) 10 GM/15ML solution Take 10 g by mouth every 4 (four) hours as needed for mild constipation.   Yes Historical Provider, MD  levothyroxine (SYNTHROID, LEVOTHROID) 112 MCG tablet LABS/APPOINTMENT OVERDUE, 1 by mouth daily in the morning 30 minutes before breakfast for Thyroid 12/10/13  Yes Tiffany L Reed, DO  loperamide (IMODIUM) 2 MG capsule Take 2 mg by mouth daily as needed for diarrhea or loose stools.   Yes Historical Provider, MD  metoCLOPramide (REGLAN) 5 MG tablet Take 5 mg by mouth every 8 (eight) hours as needed for nausea.   Yes Historical Provider, MD  midodrine (PROAMATINE) 10 MG tablet Take 1 tablet (10 mg total) by mouth 3 (three) times daily. 03/11/15  Yes Pixie Casino, MD  Multiple Vitamin (MULTI VITAMIN DAILY PO) Take 1 tablet by mouth daily.   Yes Historical Provider, MD  omeprazole (PRILOSEC) 10 MG capsule Take 10 mg by mouth daily.   Yes Historical Provider, MD  PERITONEAL DIALYSIS SOLUTIONS IP Inject 2,500 Units into the peritoneum 4 (four) times daily.    Yes Historical Provider, MD  potassium chloride SA (K-DUR,KLOR-CON) 20 MEQ tablet Take 20 mEq by mouth daily.   Yes Historical Provider, MD  silver sulfADIAZINE (SILVADENE) 1 % cream Apply 1 application topically daily. 04/22/15  Yes Trula Slade, DPM   Ct Abdomen Pelvis Wo Contrast  04/23/2015  CLINICAL DATA:  Subacute onset of postprandial  vomiting. Initial encounter. EXAM: CT ABDOMEN AND PELVIS WITHOUT CONTRAST TECHNIQUE: Multidetector CT imaging of the abdomen and pelvis was performed following the standard protocol without IV contrast. COMPARISON:  CT of the abdomen and pelvis from 07/17/2013, and abdominal ultrasound performed 04/21/2015 FINDINGS: Scattered nodules at the right lung base demonstrate mild calcification and are likely benign. Wall thickening along the distal esophagus could reflect esophagitis. Would correlate for any associated symptoms. Diffuse coronary artery calcifications  are seen. Small to moderate volume ascites is seen within the abdomen and pelvis. A peritoneal dialysis catheter is noted at the left hemipelvis. Mild vague soft tissue inflammation is noted within the mesentery, nonspecific in appearance. The liver and spleen are unremarkable in appearance. The gallbladder is within normal limits. The pancreas and adrenal glands are unremarkable. The kidneys are unremarkable in appearance. There is no evidence of hydronephrosis. No renal or ureteral stones are seen. Nonspecific perinephric stranding is noted bilaterally. No free fluid is identified. The small bowel is unremarkable in appearance. The stomach is within normal limits. No acute vascular abnormalities are seen. Scattered calcification is noted along the abdominal aorta and its branches. The appendix is normal in caliber, without evidence of appendicitis. The colon is unremarkable in appearance. The bladder is relatively decompressed and grossly unremarkable in appearance. The prostate remains normal in size. No inguinal lymphadenopathy is seen. No acute osseous abnormalities are identified. IMPRESSION: 1. Mild wall thickening along the distal esophagus could reflect esophagitis. Would correlate for associated symptoms. 2. Small to moderate volume ascites within the abdomen and pelvis. Peritoneal dialysis catheter noted at the right hemipelvis. 3. Mild vague  nonspecific soft tissue inflammation noted within the mesentery. 4. Scattered calcification along the abdominal aorta and its branches. 5. Diffuse coronary artery calcifications seen. 6. Scattered nodules at the right lung base demonstrate mild calcification and are likely benign. Electronically Signed   By: Garald Balding M.D.   On: 04/23/2015 19:31   Dg Chest 2 View  04/23/2015  CLINICAL DATA:  Acute onset of shortness of breath. Initial encounter. EXAM: CHEST  2 VIEW COMPARISON:  Chest radiograph from 12/30/2014 FINDINGS: The lungs are well-aerated and clear. There is no evidence of focal opacification, pleural effusion or pneumothorax. The heart is borderline enlarged. The patient is status post median sternotomy. No acute osseous abnormalities are seen. IMPRESSION: No acute cardiopulmonary process seen. Electronically Signed   By: Garald Balding M.D.   On: 04/23/2015 19:01   Dg Foot Complete Left  04/23/2015  CLINICAL DATA:  Left great toe cellulitis and cyanosis.  Diabetes. EXAM: LEFT FOOT - COMPLETE 3+ VIEW COMPARISON:  None. FINDINGS: There is no evidence of fracture or dislocation. No evidence of osteolysis or periostitis. Old fracture deformity of the fifth metatarsal is noted. Prominent dorsal and plantar calcaneal bone spurs noted. Diffuse peripheral vascular calcification noted. IMPRESSION: No radiographic evidence of osteomyelitis or other acute findings. Electronically Signed   By: Earle Gell M.D.   On: 04/23/2015 18:10   - pertinent xrays, CT, MRI studies were reviewed and independently interpreted  Positive ROS: All other systems have been reviewed and were otherwise negative with the exception of those mentioned in the HPI and as above.  Physical Exam: General: Alert, no acute distress Cardiovascular: nonpalpable pulses in foot Respiratory: No cyanosis, no use of accessory musculature GI: No organomegaly, abdomen is soft and non-tender Skin: black eschar on tip of left great  toe, no drainage Neurologic: sensation decreased Psychiatric: Patient is competent for consent with normal mood and affect Lymphatic: No axillary or cervical lymphadenopathy  MUSCULOSKELETAL:  - dry eschar of tip of great toe - no drainage - slight erythema around eschar - no pain  - nonpalpable pulses - sensation decreased  Assessment: Left great toe eschar  Plan: - patient has vascular calcifications on xray showing PVD - he has nonpalpable pulses - patient would need toe amputation but given his PVD may not heal a primary toe amp -  recommend vasc surgery consult to determine his circulation to the foot and help determine if he has any revascularization options  Thank you for the consult and the opportunity to see Jack Chung. Eduard Roux, MD [place] 8:27 AM

## 2015-04-24 NOTE — Progress Notes (Signed)
  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  Does 5 exchanges per day.       Assessment: 1. Nausea/ vomiting - x 2 weeks. Had similar spells in Jan and in May this year per pt's wife.  Gastroparesis vs viral GE vs underdialysis w PD.  Will get GE study as there is a real chance he could have underdialysis and it will important to try and differentiate from gastroparesis.  2. ESRD f/b Dr Joelyn Oms 3. DM 30 yrs 4. HTN stable 5. MBD cont meds 6. CAD hx CABG 2011 7. Left great toe ischemia - per primary / ortho. On IV abx.   Plan - increase to 6 exchanges per day in hospital.  Get gastric empty study   [name] MD [place] pager [phone]    cell [phone] 04/24/2015, 2:51 PM    Recent Labs Lab 04/23/15 1446 04/23/15 2130 04/24/15 0550  NA 131*  --  132*  K 3.0*  --  3.0*  CL 91*  --  94*  CO2 24  --  26  GLUCOSE 274*  --  185*  BUN 44*  --  45*  CREATININE 7.17* 7.20* 7.23*  CALCIUM 8.8*  --  7.9*    Recent Labs Lab 04/23/15 1446 04/24/15 0550  AST 36 32  ALT 26 21  ALKPHOS 128* 98  BILITOT 0.5 0.5  PROT 7.7 6.3*  ALBUMIN 2.7* 2.2*    Recent Labs Lab 04/23/15 1446 04/24/15 0550  WBC 10.1 9.8  HGB 13.1 11.9*  HCT 40.7 35.6*  MCV 97.4 96.0  PLT 365 364   .  allopurinol  100 mg Oral Daily  . aspirin  325 mg Oral Daily  . cefTAZidime (FORTAZ)  IV  1 g Intravenous Q24H  . dialysis solution 1.5% low-MG/low-CA   Intraperitoneal 5 X Daily  . heparin  5,000 Units Subcutaneous 3 times per day  . insulin aspart  0-9 Units Subcutaneous TID WC  . insulin glargine  40 Units Subcutaneous QHS  . levothyroxine  112 mcg Oral QAC breakfast  . metoCLOPramide  10 mg Oral TID AC & HS  . midodrine  10 mg Oral TID  . pantoprazole  40 mg Oral Daily  . sodium chloride  3 mL Intravenous Q12H  . sodium chloride  3 mL Intravenous Q12H     sodium chloride, acetaminophen **OR** acetaminophen, heparin, levalbuterol, ondansetron **OR** ondansetron (ZOFRAN) IV, oxyCODONE, sodium chloride

## 2015-04-25 ENCOUNTER — Encounter (HOSPITAL_COMMUNITY): Payer: Medicare Other

## 2015-04-25 DIAGNOSIS — I951 Orthostatic hypotension: Secondary | ICD-10-CM

## 2015-04-25 DIAGNOSIS — D869 Sarcoidosis, unspecified: Secondary | ICD-10-CM

## 2015-04-25 LAB — GLUCOSE, CAPILLARY
GLUCOSE-CAPILLARY: 159 mg/dL — AB (ref 65–99)
GLUCOSE-CAPILLARY: 157 mg/dL — AB (ref 65–99)
GLUCOSE-CAPILLARY: 162 mg/dL — AB (ref 65–99)
Glucose-Capillary: 133 mg/dL — ABNORMAL HIGH (ref 65–99)

## 2015-04-25 LAB — VANCOMYCIN, RANDOM: VANCOMYCIN RM: 23 ug/mL

## 2015-04-25 MED ORDER — DEXTROSE 5 % IV SOLN
500.0000 mg | INTRAVENOUS | Status: DC
  Administered 2015-04-25 – 2015-05-01 (×7): 500 mg via INTRAVENOUS
  Filled 2015-04-25 (×10): qty 0.5

## 2015-04-25 NOTE — Progress Notes (Signed)
ANTIBIOTIC CONSULT NOTE Pharmacy Consult for vancomycin + ceftazidime  Indication: wound infection  Allergies  Allergen Reactions  . Flu Virus Vaccine     Gets the flu, patient has bad reaction to the flu vaccine.  . Lipitor [Atorvastatin]     Leg cramps    Patient Measurements: Height: 5\' 10"  (177.8 cm) Weight: 228 lb 13.4 oz (103.8 kg) IBW/kg (Calculated) : 73  Vital Signs: Temp: 98.9 F (37.2 C) (11/18 1000) Temp Source: Oral (11/18 1000) BP: 125/57 mmHg (11/18 1000) Pulse Rate: 70 (11/18 1000) Intake/Output from previous day: 11/17 0701 - 11/18 0700 In: [id number] [P.O.:600; IV Piggyback:50] Out: 13500  Intake/Output from this shift: Total I/O In: 6000 [Other:6000] Out: 3000 [Other:3000]  Labs:  Recent Labs  04/23/15 1446 04/23/15 2130 04/24/15 0550  WBC 10.1  --  9.8  HGB 13.1  --  11.9*  PLT 365  --  364  CREATININE 7.17* 7.20* 7.23*   Estimated Creatinine Clearance: 11.3 mL/min (by C-G formula based on Cr of 7.23).  Recent Labs  04/25/15 0540  [facility] 23     Microbiology: Recent Results (from the past 720 hour(s))  Culture, blood (routine x 2)     Status: None (Preliminary result)   Collection Time: 04/23/15  3:39 PM  Result Value Ref Range Status   Specimen Description BLOOD LEFT ARM  Final   Special Requests IN PEDIATRIC BOTTLE 3CC  Final   Culture NO GROWTH 2 DAYS  Final   Report Status PENDING  Incomplete  Culture, blood (routine x 2)     Status: None (Preliminary result)   Collection Time: 04/23/15  9:30 PM  Result Value Ref Range Status   Specimen Description BLOOD LEFT HAND  Final   Special Requests BOTTLES DRAWN AEROBIC ONLY 5CC  Final   Culture NO GROWTH 2 DAYS  Final   Report Status PENDING  Incomplete  Culture, body fluid-bottle     Status: None (Preliminary result)   Collection Time: 04/23/15 11:45 PM  Result Value Ref Range Status   Specimen Description FLUID PERITONEAL  Final   Special Requests BOTTLES DRAWN AEROBIC AND  ANAEROBIC 10CC  Final   Culture NO GROWTH 1 DAY  Final   Report Status PENDING  Incomplete  Gram stain     Status: None   Collection Time: 04/23/15 11:45 PM  Result Value Ref Range Status   Specimen Description FLUID PERITONEAL  Final   Special Requests NONE  Final   Gram Stain NO WBC SEEN NO ORGANISMS SEEN   Final   Report Status 04/24/2015 FINAL  Final    Medical History: Past Medical History  Diagnosis Date  . Hypertension   . Diabetes mellitus   . Hypothyroidism   . CHF (congestive heart failure) ([place])   . Anemia   . Blind left eye   . Sleep apnea     not on CPAP  . Coronary artery disease     CABG x 11 Jun 2009.  MRSA infections of incsions  . Arthritis   . Sarcoidosis ([place])     Hx:of  . Diabetic retinopathy ([place])     Hx: of bilateral  . Edema   . Hyperlipidemia   . 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 Atherosclerosis of native arteries of the extremities, unspecified   . Eczema     Hx: of  . Insomnia   . Seasonal allergies   . ESRD on dialysis [facility])     Was  on dialysis in July 2011 and then stopped and restarted May 2014. He then transitioned to PD in Aug 2014. He does PD on cycler when at home and CAPD 6 exchanges per day when travelling. His wife is "in charge" and does the exchanges.   . History of blood transfusion     with heart surgery  . Coronary atherosclerosis of native coronary artery   . Atherosclerosis of native arteries of the extremities, unspecified     Assessment: 74 yo m presenting to the ED on 11/16 with nausea and vomiting x 1 month.  Patient has been on clindamycin and cipro for the last month for left great toe discoloration and drainage, but he has not been able to keep any of the abx down. Patient is to be admitted to receive IV abx.  Patient is on PD at home and is anuric.  Now day #3 of abx for wound infection of L great toe. Afebrile, WBC wnl. Received load dose of vancomycin 2g on 11/16. VR this am was 23.  Cultures are pending with no growth yet. Vascular plan is to do an angiogram on 11/21 if patient agrees.  Goal of Therapy:  Vancomycin trough level 10-15 mcg/ml  Plan:  Decrease ceftazidime to 500mg  IV Q24 Plan to check another VR on Sunday (~4 days from LD) Monitor clinical picture, VR prn F/U C&S, abx deescalation / LOT  Elenor Quinones, PharmD Clinical Pharmacist Pager [phone] 04/25/2015 2:16 PM

## 2015-04-25 NOTE — Progress Notes (Signed)
[facility] PROGRESS NOTE  Jack Chung [id number] DOB: 04-26-1941 DOA: 04/23/2015 PCP: [name], MD  Assessment/Plan: Intractable nausea vomiting Likely an exacerbation of gastroparesis vs under-dialysis vs infection (left great toe gangrene) Per renal rec's will check gastric emptying study (planned for 11/21) Will continue increased reglan QID 10 mg and follow clinical response Minimize narcotics Esophagogram w/o strictures, but positive delay transit Will continue PPI and advance diet   Gangrene of the left great toe-blood culture 2, lactate mildly elevated on admission x-rays of the left toe w/o signs of osteomyelitis Ortho recommending amputation, but due to PVD, high risk for not healing Vascular surgery consulted and recommending ABI with TBI and aortogram Will continue current antibiotics for now Will follow results and further rec's  History of orthostatic hypotension-continue midodrine  Uncontrolled diabetes: with A1C 8.7 Will continue SSI and lantus  Hypokalemia to be corrected by nephrology with PD  Chronic CHF (congestive heart failure) (HCC)-patient without any signs and symptoms of fluid overload of exacerbation, most recent EF 0000000, diastolic in nature will follow low sodium diet daily weights Strict intake and output  Hx of Sarcoidosis (HCC)-previously on prednisone; stable and denying any complaints currently   Sleep apnea Not on CPAP Will monitor for need of O2 supplementation  ESRD on dialysis [facility]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 -low calorie diet and increase physical activity discussed with patient   Gout-continue allopurinol  CAD (coronary artery disease)-multivessel coronary artery disease, medical therapy recommended, followed by [name], status post 3 vessel CABG in 2011 -no CP and no SOB -troponin with flat elevation  at 0.09 and no suggesting ACS; most likely due to chronic renal failure  Code Status: Full Family Communication: wife at bedside  Disposition Plan: to be determine; patient still nauseous and with ongoing changes on his left great toe that might require amputation. Continue antibiotics, follow vascular and ortho rec's     Consultants:  Renal service  Orthopedic service   Vascular surgery  Procedures:  See below for x-ray reports   Gastric empty study:  ABI  Aortogram with leg run off  Antibiotics:  vanc and fortaz 11/16  HPI/Subjective: Afebrile, no CP and no SOB. Patient denies further vomiting; tolerating PD here. Slightly frustrated about not been able to have gastric empty study today.  Objective: Filed Vitals:   04/25/15 1000  BP: 125/57  Pulse: 70  Temp: 98.9 F (37.2 C)  Resp:     Intake/Output Summary (Last 24 hours) at 04/25/15 1608 Last data filed at 04/25/15 1400  Gross per 24 hour  Intake  15290 ml  Output  12100 ml  Net   3190 ml   Filed Weights   04/23/15 1939 04/23/15 2054 04/24/15 0440  Weight: 103.5 kg (228 lb 2.8 oz) 103.8 kg (228 lb 13.4 oz) 103.8 kg (228 lb 13.4 oz)    Exam:   General:  Reports no CP and no SOB. Mildly nauseous, but denies further vomiting. Afebrile   Cardiovascular: S1 and S2, no rubs or gallops  Respiratory: good air movement, no wheezing  Abdomen: soft, NT, ND, positive BS  Musculoskeletal: no swelling, LLE with necrotic changes and scar on tip of his great toe; denies pain and no discharges appreciated  Data Reviewed: Basic Metabolic Panel:  Recent Labs Lab 04/23/15 1446 04/23/15 1810 04/23/15 2130 04/24/15 0550  NA 131*  --   --  132*  K 3.0*  --   --  3.0*  CL 91*  --   --  94*  CO2 24  --   --  26  GLUCOSE 274*  --   --  185*  BUN 44*  --   --  45*  CREATININE 7.17*  --  7.20* 7.23*  CALCIUM 8.8*  --   --  7.9*  MG  --  1.8  --   --    Liver Function Tests:  Recent Labs Lab  04/23/15 1446 04/24/15 0550  AST 36 32  ALT 26 21  ALKPHOS 128* 98  BILITOT 0.5 0.5  PROT 7.7 6.3*  ALBUMIN 2.7* 2.2*    Recent Labs Lab 04/23/15 1446  LIPASE 29   CBC:  Recent Labs Lab 04/23/15 1446 04/24/15 0550  WBC 10.1 9.8  HGB 13.1 11.9*  HCT 40.7 35.6*  MCV 97.4 96.0  PLT 365 364   Cardiac Enzymes:  Recent Labs Lab 04/23/15 2130 04/23/15 2330 04/24/15 0550  TROPONINI 0.07* 0.06* 0.09*   CBG:  Recent Labs Lab 04/24/15 1102 04/24/15 1636 04/24/15 2235 04/25/15 0736 04/25/15 1256  GLUCAP 151* 215* 214* 133* 159*    Recent Results (from the past 240 hour(s))  Culture, blood (routine x 2)     Status: None (Preliminary result)   Collection Time: 04/23/15  3:39 PM  Result Value Ref Range Status   Specimen Description BLOOD LEFT ARM  Final   Special Requests IN PEDIATRIC BOTTLE 3CC  Final   Culture NO GROWTH 2 DAYS  Final   Report Status PENDING  Incomplete  Culture, blood (routine x 2)     Status: None (Preliminary result)   Collection Time: 04/23/15  9:30 PM  Result Value Ref Range Status   Specimen Description BLOOD LEFT HAND  Final   Special Requests BOTTLES DRAWN AEROBIC ONLY 5CC  Final   Culture NO GROWTH 2 DAYS  Final   Report Status PENDING  Incomplete  Culture, body fluid-bottle     Status: None (Preliminary result)   Collection Time: 04/23/15 11:45 PM  Result Value Ref Range Status   Specimen Description FLUID PERITONEAL  Final   Special Requests BOTTLES DRAWN AEROBIC AND ANAEROBIC 10CC  Final   Culture NO GROWTH 1 DAY  Final   Report Status PENDING  Incomplete  Gram stain     Status: None   Collection Time: 04/23/15 11:45 PM  Result Value Ref Range Status   Specimen Description FLUID PERITONEAL  Final   Special Requests NONE  Final   Gram Stain NO WBC SEEN NO ORGANISMS SEEN   Final   Report Status 04/24/2015 FINAL  Final     Studies: Ct Abdomen Pelvis Wo Contrast  04/23/2015  CLINICAL DATA:  Subacute onset of postprandial  vomiting. Initial encounter. EXAM: CT ABDOMEN AND PELVIS WITHOUT CONTRAST TECHNIQUE: Multidetector CT imaging of the abdomen and pelvis was performed following the standard protocol without IV contrast. COMPARISON:  CT of the abdomen and pelvis from 07/17/2013, and abdominal ultrasound performed 04/21/2015 FINDINGS: Scattered nodules at the right lung base demonstrate mild calcification and are likely benign. Wall thickening along the distal esophagus could reflect esophagitis. Would correlate for any associated symptoms. Diffuse coronary artery calcifications are seen. Small to moderate volume ascites is seen within the abdomen and pelvis. A peritoneal dialysis catheter is noted at the left hemipelvis. Mild vague soft tissue inflammation is noted within the mesentery, nonspecific in appearance. The liver and spleen are unremarkable in  appearance. The gallbladder is within normal limits. The pancreas and adrenal glands are unremarkable. The kidneys are unremarkable in appearance. There is no evidence of hydronephrosis. No renal or ureteral stones are seen. Nonspecific perinephric stranding is noted bilaterally. No free fluid is identified. The small bowel is unremarkable in appearance. The stomach is within normal limits. No acute vascular abnormalities are seen. Scattered calcification is noted along the abdominal aorta and its branches. The appendix is normal in caliber, without evidence of appendicitis. The colon is unremarkable in appearance. The bladder is relatively decompressed and grossly unremarkable in appearance. The prostate remains normal in size. No inguinal lymphadenopathy is seen. No acute osseous abnormalities are identified. IMPRESSION: 1. Mild wall thickening along the distal esophagus could reflect esophagitis. Would correlate for associated symptoms. 2. Small to moderate volume ascites within the abdomen and pelvis. Peritoneal dialysis catheter noted at the right hemipelvis. 3. Mild vague  nonspecific soft tissue inflammation noted within the mesentery. 4. Scattered calcification along the abdominal aorta and its branches. 5. Diffuse coronary artery calcifications seen. 6. Scattered nodules at the right lung base demonstrate mild calcification and are likely benign. Electronically Signed   By: Garald Balding M.D.   On: 04/23/2015 19:31   Dg Chest 2 View  04/23/2015  CLINICAL DATA:  Acute onset of shortness of breath. Initial encounter. EXAM: CHEST  2 VIEW COMPARISON:  Chest radiograph from 12/30/2014 FINDINGS: The lungs are well-aerated and clear. There is no evidence of focal opacification, pleural effusion or pneumothorax. The heart is borderline enlarged. The patient is status post median sternotomy. No acute osseous abnormalities are seen. IMPRESSION: No acute cardiopulmonary process seen. Electronically Signed   By: Garald Balding M.D.   On: 04/23/2015 19:01   Dg Esophagus  04/24/2015  CLINICAL DATA:  Dysphagia with lower chest pressure when eating. Initial encounter. EXAM: ESOPHOGRAM/BARIUM SWALLOW TECHNIQUE: Single contrast examination was performed using  thin barium. FLUOROSCOPY TIME:  Radiation Exposure Index (as provided by the fluoroscopic device): If the device does not provide the exposure index: Fluoroscopy Time:  1 minute and 6 seconds Number of Acquired Images:  All images acquired with fluoro store. COMPARISON:  Abdominal CT 04/23/2015. FINDINGS: Study was performed in the supine, semi erect and right lateral decubitus positions due to the patient's limited mobility. The esophageal motility is within normal limits. No mucosal ulceration, stricture or mass identified. No laryngeal penetration or cervical esophageal abnormality identified. No significant gastroesophageal reflux was elicited with the water siphon test. A 13 mm barium tablet was administered. This was retained in the distal esophagus for approximately 2 minutes. With the aid of thin barium and applesauce, this  tablet did eventually pass into the stomach. IMPRESSION: 1. No significant findings identified. No evidence of esophagitis or significant stricture. 2. Barium tablet was temporarily retained within the distal esophagus, although did pass into the stomach. Electronically Signed   By: Richardean Sale M.D.   On: 04/24/2015 09:51   Dg Foot Complete Left  04/23/2015  CLINICAL DATA:  Left great toe cellulitis and cyanosis.  Diabetes. EXAM: LEFT FOOT - COMPLETE 3+ VIEW COMPARISON:  None. FINDINGS: There is no evidence of fracture or dislocation. No evidence of osteolysis or periostitis. Old fracture deformity of the fifth metatarsal is noted. Prominent dorsal and plantar calcaneal bone spurs noted. Diffuse peripheral vascular calcification noted. IMPRESSION: No radiographic evidence of osteomyelitis or other acute findings. Electronically Signed   By: Earle Gell M.D.   On: 04/23/2015 18:10    Scheduled  Meds: . allopurinol  100 mg Oral Daily  . aspirin  325 mg Oral Daily  . cefTAZidime (FORTAZ)  IV  500 mg Intravenous Q24H  . dialysis solution 1.5% low-MG/low-CA   Intraperitoneal 5 X Daily  . heparin  5,000 Units Subcutaneous 3 times per day  . insulin aspart  0-9 Units Subcutaneous TID WC  . insulin glargine  40 Units Subcutaneous QHS  . levothyroxine  112 mcg Oral QAC breakfast  . metoCLOPramide  10 mg Oral TID AC & HS  . midodrine  10 mg Oral TID  . pantoprazole  40 mg Oral Daily  . sodium chloride  3 mL Intravenous Q12H  . sodium chloride  3 mL Intravenous Q12H   Continuous Infusions:   Principal Problem:   Intractable nausea and vomiting Active Problems:   CHF (congestive heart failure) (HCC)   Sarcoidosis (HCC)   Sleep apnea   ESRD on dialysis ([place])   Gout   CAD (coronary artery disease)   Orthostatic hypotension    Time spent: 35 minutes     Barton Dubois  [facility] Pager [phone]. If 7PM-7AM, please contact night-coverage at [email or web address], password  [facility] 04/25/2015, 4:08 PM  LOS: 2 days

## 2015-04-25 NOTE — Progress Notes (Signed)
  [facility] Progress Note   Subjective: denies N/V, has not had solid food yet though. No new c/o's  Filed Vitals:   04/24/15 2105 04/25/15 0550 04/25/15 0902 04/25/15 1000  BP: 101/54 101/53 114/51 125/57  Pulse: 66 71 69 70  Temp: 98 F (36.7 C) 98.2 F (36.8 C) 98.6 F (37 C) 98.9 F (37.2 C)  TempSrc: Oral Oral Oral Oral  Resp: 18 18 18    Height:      Weight:      SpO2: 100% 97% 98% 97%   Exam: Alert, no distress  nojvd Chest clear bilat RRR no MRG Abd soft obese ntnd no ascites +LLQ  PD cath clean exit No LE edema Left great toe dry gangrene at tip Pulses 2+ , symmetric No rash or ulcers Neuro is alert < ox 3  CAPD since 2014, volume recently increased from 2500 to 3000 cc.  Does 5 exchanges per day.       Assessment: 1. Nausea/ vomiting - x 2 weeks. Hx of similar problems earlier this year. Suspect gastroparesis vs chronic underdialysis from PD.  For GES. Was just increased from 12.5 to 15 L per day prior to admission.  We are giving him 18L per day here- he is an average to slow transporter so is on the appropriate modality with CAPD (tested last in 2014).  2. ESRD f/b Dr Joelyn Oms, cont PD 3. Volume - BP's good, no edema 4. DM 30 yrs 5. HTN stable 6. MBD cont meds 7. CAD hx CABG 2011 8. Left great toe ischemia - per primary / ortho. On IV abx.   Plan - cont PD all 1.5 % for now, advancing diet, GE study pending.    [name] MD [facility] pager [phone]    cell [phone] 04/25/2015, 10:53 AM    Recent Labs Lab 04/23/15 1446 04/23/15 2130 04/24/15 0550  NA 131*  --  132*  K 3.0*  --  3.0*  CL 91*  --  94*  CO2 24  --  26  GLUCOSE 274*  --  185*  BUN 44*  --  45*  CREATININE 7.17* 7.20* 7.23*  CALCIUM 8.8*  --  7.9*    Recent Labs Lab 04/23/15 1446 04/24/15 0550  AST 36 32  ALT 26 21  ALKPHOS 128* 98  BILITOT 0.5 0.5  PROT 7.7 6.3*  ALBUMIN 2.7* 2.2*    Recent Labs Lab 04/23/15 1446  04/24/15 0550  WBC 10.1 9.8  HGB 13.1 11.9*  HCT 40.7 35.6*  MCV 97.4 96.0  PLT 365 364   . allopurinol  100 mg Oral Daily  . aspirin  325 mg Oral Daily  . cefTAZidime (FORTAZ)  IV  1 g Intravenous Q24H  . dialysis solution 1.5% low-MG/low-CA   Intraperitoneal 5 X Daily  . heparin  5,000 Units Subcutaneous 3 times per day  . insulin aspart  0-9 Units Subcutaneous TID WC  . insulin glargine  40 Units Subcutaneous QHS  . levothyroxine  112 mcg Oral QAC breakfast  . metoCLOPramide  10 mg Oral TID AC & HS  . midodrine  10 mg Oral TID  . pantoprazole  40 mg Oral Daily  . sodium chloride  3 mL Intravenous Q12H  . sodium chloride  3 mL Intravenous Q12H     sodium chloride, acetaminophen **OR** acetaminophen, heparin, levalbuterol, ondansetron **OR** ondansetron (ZOFRAN) IV, oxyCODONE, sodium chloride

## 2015-04-26 ENCOUNTER — Inpatient Hospital Stay (HOSPITAL_COMMUNITY): Payer: Medicare Other

## 2015-04-26 ENCOUNTER — Other Ambulatory Visit: Payer: Self-pay

## 2015-04-26 DIAGNOSIS — I739 Peripheral vascular disease, unspecified: Secondary | ICD-10-CM

## 2015-04-26 DIAGNOSIS — I70269 Atherosclerosis of native arteries of extremities with gangrene, unspecified extremity: Secondary | ICD-10-CM

## 2015-04-26 DIAGNOSIS — E1151 Type 2 diabetes mellitus with diabetic peripheral angiopathy without gangrene: Secondary | ICD-10-CM

## 2015-04-26 LAB — RENAL FUNCTION PANEL
ANION GAP: 14 (ref 5–15)
Albumin: 2.1 g/dL — ABNORMAL LOW (ref 3.5–5.0)
BUN: 33 mg/dL — ABNORMAL HIGH (ref 6–20)
CALCIUM: 7.4 mg/dL — AB (ref 8.9–10.3)
CO2: 24 mmol/L (ref 22–32)
Chloride: 91 mmol/L — ABNORMAL LOW (ref 101–111)
Creatinine, Ser: 7.11 mg/dL — ABNORMAL HIGH (ref 0.61–1.24)
GFR calc Af Amer: 8 mL/min — ABNORMAL LOW (ref >60)
GFR calc non Af Amer: 7 mL/min — ABNORMAL LOW (ref >60)
GLUCOSE: 134 mg/dL — AB (ref 65–99)
POTASSIUM: 3 mmol/L — AB (ref 3.5–5.1)
Phosphorus: 4.5 mg/dL (ref 2.5–4.6)
SODIUM: 129 mmol/L — AB (ref 135–145)

## 2015-04-26 LAB — GLUCOSE, CAPILLARY
GLUCOSE-CAPILLARY: 93 mg/dL (ref 65–99)
GLUCOSE-CAPILLARY: 150 mg/dL — AB (ref 65–99)
GLUCOSE-CAPILLARY: 210 mg/dL — AB (ref 65–99)
Glucose-Capillary: 229 mg/dL — ABNORMAL HIGH (ref 65–99)

## 2015-04-26 LAB — CBC
HEMATOCRIT: 34.6 % — AB (ref 39.0–52.0)
HEMOGLOBIN: 11.3 g/dL — AB (ref 13.0–17.0)
MCH: 31.5 pg (ref 26.0–34.0)
MCHC: 32.7 g/dL (ref 30.0–36.0)
MCV: 96.4 fL (ref 78.0–100.0)
Platelets: 363 10*3/uL (ref 150–400)
RBC: 3.59 MIL/uL — ABNORMAL LOW (ref 4.22–5.81)
RDW: 13.9 % (ref 11.5–15.5)
WBC: 11.1 10*3/uL — ABNORMAL HIGH (ref 4.0–10.5)

## 2015-04-26 MED ORDER — RENA-VITE PO TABS
1.0000 | ORAL_TABLET | Freq: Every day | ORAL | Status: DC
  Administered 2015-04-26 – 2015-05-01 (×6): 1 via ORAL
  Filled 2015-04-26 (×6): qty 1

## 2015-04-26 MED ORDER — POTASSIUM CHLORIDE CRYS ER 20 MEQ PO TBCR
20.0000 meq | EXTENDED_RELEASE_TABLET | Freq: Every day | ORAL | Status: DC
  Administered 2015-04-26 – 2015-04-27 (×2): 20 meq via ORAL
  Filled 2015-04-26 (×2): qty 1

## 2015-04-26 MED ORDER — CALCIUM CARBONATE ANTACID 500 MG PO CHEW
800.0000 mg | CHEWABLE_TABLET | Freq: Three times a day (TID) | ORAL | Status: DC
  Administered 2015-04-26 – 2015-04-29 (×8): 800 mg via ORAL
  Filled 2015-04-26 (×10): qty 4

## 2015-04-26 NOTE — Progress Notes (Addendum)
VASCULAR LAB PRELIMINARY  ARTERIAL  ABI completed:ABIs indicate probable calcificied vessels.  Right great toe pressure is within normal limits. Unable to get left great toe pressure secondary to ulceration, however used 2nd toe.  2nd toe pressure is abnormal.     RIGHT    LEFT    PRESSURE WAVEFORM  PRESSURE WAVEFORM  BRACHIAL fistula  BRACHIAL 126 T  DP   DP    AT 178 M AT 135 M  PT 144 M PT 300 M  PER   PER    GREAT TOE 41 NA 2nd TOE 7 NA    RIGHT LEFT  ABI >1 >1     Eldo Umanzor, RVT 04/26/2015, 4:27 PM

## 2015-04-26 NOTE — Progress Notes (Signed)
[facility] PROGRESS NOTE  Jack Chung [id number] DOB: 10/24/1940 DOA: 04/23/2015 PCP: [name], MD  Assessment/Plan: Intractable nausea vomiting Likely an exacerbation of gastroparesis vs under-dialysis vs infection (left great toe gangrene) Per renal rec's will check gastric emptying study (planned for 11/21) Will continue increased reglan QID 10 mg and follow clinical response Minimize use of narcotics Esophagogram w/o strictures, but positive delay transit Will continue PPI and tolerating diet, no further nausea or vomiting   Gangrene of the left great toe-blood culture 2, lactate mildly elevated on admission x-rays of the left toe w/o signs of osteomyelitis Ortho recommending amputation, but due to PVD, high risk for not healing Vascular surgery consulted and recommending ABI with TBI and aortogram (last one planned for 11/21), patient in agreement and looking to have test done. Will continue current antibiotics for now Will follow results and further rec's  History of orthostatic hypotension-continue midodrine  Uncontrolled diabetes: with A1C 8.7 Will continue SSI and lantus  Hypokalemia to be corrected by nephrology with PD/supplementation   Chronic CHF (congestive heart failure) (HCC)-patient without any signs and symptoms of fluid overload of exacerbation, most recent EF 0000000, diastolic in nature will follow low sodium diet daily weights Strict intake and output  Hx of Sarcoidosis (HCC)-previously on prednisone; stable and denying any complaints currently   Sleep apnea Not on CPAP Will monitor for need of O2 supplementation  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 -low calorie diet and increase physical activity discussed with patient   Gout-continue allopurinol  CAD (coronary artery disease)-multivessel coronary artery  disease, medical therapy recommended, followed by [name], status post 3 vessel CABG in 2011 -no CP and no SOB -troponin with flat elevation at 0.09 and no suggesting ACS; most likely due to chronic renal failure -no abnormalities on tele suggesting ischemia; will d/c telemetry   Code Status: Full Family Communication: wife at bedside  Disposition Plan: to be determine; patient still nauseous and with ongoing changes on his left great toe that might require amputation. Continue antibiotics, follow vascular and ortho rec's     Consultants:  Renal service  Orthopedic service   Vascular surgery  Procedures:  See below for x-ray reports   Gastric empty study: planned for 11/21  ABI: pending   Aortogram with leg run off: planned for 11/21  Antibiotics:  vanc and fortaz 11/16  HPI/Subjective: Afebrile, no CP and no SOB. Patient denies further nausea or vomiting; tolerating PD here w/o problems. Slightly frustrated about time for any procedure to be done. He is in agreement to have aortogram if needed.  Objective: Filed Vitals:   04/26/15 0833  BP: 101/43  Pulse: 79  Temp: 99.1 F (37.3 C)  Resp: 19    Intake/Output Summary (Last 24 hours) at 04/26/15 1504 Last data filed at 04/26/15 1427  Gross per 24 hour  Intake  12650 ml  Output  12400 ml  Net    250 ml   Filed Weights   04/23/15 1939 04/23/15 2054 04/24/15 0440  Weight: 103.5 kg (228 lb 2.8 oz) 103.8 kg (228 lb 13.4 oz) 103.8 kg (228 lb 13.4 oz)    Exam:   General:  Reports no CP and no SOB. No further nausea or vomiting. Tolerating diet. Afebrile   Cardiovascular: S1 and S2, no rubs or gallops  Respiratory: good air movement, no wheezing  Abdomen: soft, NT, ND,  positive BS  Musculoskeletal: no swelling, LLE with necrotic changes and scar on tip of his great toe; denies pain and no discharge appreciated; denies pain  Data Reviewed: Basic Metabolic Panel:  Recent Labs Lab 04/23/15 1446  04/23/15 1810 04/23/15 2130 04/24/15 0550 04/26/15 0429  NA 131*  --   --  132* 129*  K 3.0*  --   --  3.0* 3.0*  CL 91*  --   --  94* 91*  CO2 24  --   --  26 24  GLUCOSE 274*  --   --  185* 134*  BUN 44*  --   --  45* 33*  CREATININE 7.17*  --  7.20* 7.23* 7.11*  CALCIUM 8.8*  --   --  7.9* 7.4*  MG  --  1.8  --   --   --   PHOS  --   --   --   --  4.5   Liver Function Tests:  Recent Labs Lab 04/23/15 1446 04/24/15 0550 04/26/15 0429  AST 36 32  --   ALT 26 21  --   ALKPHOS 128* 98  --   BILITOT 0.5 0.5  --   PROT 7.7 6.3*  --   ALBUMIN 2.7* 2.2* 2.1*    Recent Labs Lab 04/23/15 1446  LIPASE 29   CBC:  Recent Labs Lab 04/23/15 1446 04/24/15 0550 04/26/15 0429  WBC 10.1 9.8 11.1*  HGB 13.1 11.9* 11.3*  HCT 40.7 35.6* 34.6*  MCV 97.4 96.0 96.4  PLT 365 364 363   Cardiac Enzymes:  Recent Labs Lab 04/23/15 2130 04/23/15 2330 04/24/15 0550  TROPONINI 0.07* 0.06* 0.09*   CBG:  Recent Labs Lab 04/25/15 1256 04/25/15 1650 04/25/15 2140 04/26/15 0756 04/26/15 1139  GLUCAP 159* 157* 162* 93 229*    Recent Results (from the past 240 hour(s))  Culture, blood (routine x 2)     Status: None (Preliminary result)   Collection Time: 04/23/15  3:39 PM  Result Value Ref Range Status   Specimen Description BLOOD LEFT ARM  Final   Special Requests IN PEDIATRIC BOTTLE 3CC  Final   Culture NO GROWTH 3 DAYS  Final   Report Status PENDING  Incomplete  Culture, blood (routine x 2)     Status: None (Preliminary result)   Collection Time: 04/23/15  9:30 PM  Result Value Ref Range Status   Specimen Description BLOOD LEFT HAND  Final   Special Requests BOTTLES DRAWN AEROBIC ONLY 5CC  Final   Culture NO GROWTH 3 DAYS  Final   Report Status PENDING  Incomplete  Culture, body fluid-bottle     Status: None (Preliminary result)   Collection Time: 04/23/15 11:45 PM  Result Value Ref Range Status   Specimen Description FLUID PERITONEAL  Final   Special Requests  BOTTLES DRAWN AEROBIC AND ANAEROBIC 10CC  Final   Culture NO GROWTH 2 DAYS  Final   Report Status PENDING  Incomplete  Gram stain     Status: None   Collection Time: 04/23/15 11:45 PM  Result Value Ref Range Status   Specimen Description FLUID PERITONEAL  Final   Special Requests NONE  Final   Gram Stain NO WBC SEEN NO ORGANISMS SEEN   Final   Report Status 04/24/2015 FINAL  Final     Studies: No results found.  Scheduled Meds: . allopurinol  100 mg Oral Daily  . aspirin  325 mg Oral Daily  . calcium carbonate  800 mg  of elemental calcium Oral TID  . cefTAZidime (FORTAZ)  IV  500 mg Intravenous Q24H  . dialysis solution 1.5% low-MG/low-CA   Intraperitoneal 5 X Daily  . heparin  5,000 Units Subcutaneous 3 times per day  . insulin aspart  0-9 Units Subcutaneous TID WC  . insulin glargine  40 Units Subcutaneous QHS  . levothyroxine  112 mcg Oral QAC breakfast  . metoCLOPramide  10 mg Oral TID AC & HS  . midodrine  10 mg Oral TID  . multivitamin  1 tablet Oral QHS  . pantoprazole  40 mg Oral Daily  . potassium chloride  20 mEq Oral Daily  . sodium chloride  3 mL Intravenous Q12H  . sodium chloride  3 mL Intravenous Q12H   Continuous Infusions:   Principal Problem:   Intractable nausea and vomiting Active Problems:   CHF (congestive heart failure) (HCC)   Sarcoidosis (HCC)   Sleep apnea   ESRD on dialysis ([place])   Gout   CAD (coronary artery disease)   Orthostatic hypotension    Time spent: 35 minutes     Barton Dubois  [facility] Pager [phone]. If 7PM-7AM, please contact night-coverage at [email or web address], password [email or web address] 04/26/2015, 3:04 PM  LOS: 3 days

## 2015-04-26 NOTE — Progress Notes (Signed)
[facility] Progress Note  Assessment/Plan: 1. Nausea/ vomiting - x 2 weeks. Hx of similar problems earlier this year. 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 We are giving him 18L per day here- he is an average to slow transporter so is on the appropriate modality with CAPD (tested last in 2014).  Eating solids last night and today without problems. Swallowing study neg- did have brief retention of tablet in distal esophagus but then passed into stomach. GES planned for 11/21 2. ESRD f/u Dr Joelyn Oms, cont PD - continue current CAPD, Na declining - watch; K low - on KCl 20 per day - resume 3. DM 30 yrs 4. 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 CAD hx CABG 2011 7. Left great toe ischemia/gangrene - per primary / ortho. On IV Fortaz and Vanc 8. Anemia - Hgb trending down 11.3- no ESA yet 9. Nutrition - alb low 2.1 - need ^ protein diet, but recent issues with N and V, add prostat/nepro- d/c renal restriction - no need to restrict K/volume- carb mod only   [name], PA-C [place] [phone] 04/26/2015,12:57 PM  LOS: 3 days   Pt seen, examined and agree w A/P as above.  [name] MD [facility] pager [phone]    cell [phone] 04/26/2015, 7:09 PM    Subjective:   No problems with PD. Pt's wife extolling the virtues of PD.  Objective Filed Vitals:   04/25/15 2141 04/26/15 0424 04/26/15 0811 04/26/15 0833  BP: 128/55 110/46 120/50 101/43  Pulse: 70 85  79  Temp: 98.9 F (37.2 C) 99 F (37.2 C)  99.1 F (37.3 C)  TempSrc: Oral Oral  Oral  Resp: 18 16  19   Height:      Weight:      SpO2: 98% 96%  95%   Physical Exam General: alert sitting on side of bed eating lunch with wife Heart: RRR Lungs: no rales Abdomen: soft  fluid in belly nontender Extremities: no LE edema; tip -left great toe dry gangrene Dialysis Access: PD cath  Dialysis Orders: CAPD with 5 exchanges per day at 8am, 11am, 2pm, 5pm and overnight dwell at 8pm - followed by Dr. Pearson Grippe who recently increased dwell volume from 2.5L to 3L but pt has yet to receive those fluids. Uses 1.5% then 2.5% x3 and last exchange with 1.5%. EDW is 105kg but currently he's down to 100.5kg because of poor intake.  Additional Objective Labs: Basic Metabolic Panel:  Recent Labs Lab 04/23/15 1446 04/23/15 2130 04/24/15 0550 04/26/15 0429  NA 131*  --  132* 129*  K 3.0*  --  3.0* 3.0*  CL 91*  --  94* 91*  CO2 24  --  26 24  GLUCOSE 274*  --  185* 134*  BUN 44*  --  45* 33*  CREATININE 7.17* 7.20* 7.23* 7.11*  CALCIUM 8.8*  --  7.9* 7.4*  PHOS  --   --   --  4.5   Liver Function Tests:  Recent Labs Lab 04/23/15 1446 04/24/15 0550 04/26/15 0429  AST 36 32  --   ALT 26 21  --   ALKPHOS 128* 98  --   BILITOT 0.5 0.5  --   PROT 7.7  6.3*  --   ALBUMIN 2.7* 2.2* 2.1*    Recent Labs Lab 04/23/15 1446  LIPASE 29   CBC:  Recent Labs Lab 04/23/15 1446 04/24/15 0550 04/26/15 0429  WBC 10.1 9.8 11.1*  HGB 13.1 11.9* 11.3*  HCT 40.7 35.6* 34.6*  MCV 97.4 96.0 96.4  PLT 365 364 363   Blood Culture    Component Value Date/Time   SDES FLUID PERITONEAL 04/23/2015 2345   SDES FLUID PERITONEAL 04/23/2015 2345   SPECREQUEST BOTTLES DRAWN AEROBIC AND ANAEROBIC 10CC 04/23/2015 2345   SPECREQUEST NONE 04/23/2015 2345   CULT NO GROWTH 1 DAY 04/23/2015 2345   REPTSTATUS PENDING 04/23/2015 2345   REPTSTATUS 04/24/2015 FINAL 04/23/2015 2345    Cardiac Enzymes:  Recent Labs Lab 04/23/15 2130 04/23/15 2330 04/24/15 0550  TROPONINI 0.07* 0.06* 0.09*   CBG:  Recent Labs Lab 04/25/15 1256 04/25/15 1650 04/25/15 2140 04/26/15 0756 04/26/15 1139  GLUCAP 159* 157* 162* 93 229*   IMedications:   . allopurinol  100 mg Oral Daily   . aspirin  325 mg Oral Daily  . cefTAZidime (FORTAZ)  IV  500 mg Intravenous Q24H  . dialysis solution 1.5% low-MG/low-CA   Intraperitoneal 5 X Daily  . heparin  5,000 Units Subcutaneous 3 times per day  . insulin aspart  0-9 Units Subcutaneous TID WC  . insulin glargine  40 Units Subcutaneous QHS  . levothyroxine  112 mcg Oral QAC breakfast  . metoCLOPramide  10 mg Oral TID AC & HS  . midodrine  10 mg Oral TID  . pantoprazole  40 mg Oral Daily  . sodium chloride  3 mL Intravenous Q12H  . sodium chloride  3 mL Intravenous Q12H

## 2015-04-27 DIAGNOSIS — E1121 Type 2 diabetes mellitus with diabetic nephropathy: Secondary | ICD-10-CM

## 2015-04-27 DIAGNOSIS — K3184 Gastroparesis: Secondary | ICD-10-CM

## 2015-04-27 LAB — GLUCOSE, CAPILLARY
GLUCOSE-CAPILLARY: 170 mg/dL — AB (ref 65–99)
Glucose-Capillary: 226 mg/dL — ABNORMAL HIGH (ref 65–99)
Glucose-Capillary: 277 mg/dL — ABNORMAL HIGH (ref 65–99)
Glucose-Capillary: 351 mg/dL — ABNORMAL HIGH (ref 65–99)

## 2015-04-27 LAB — VANCOMYCIN, RANDOM: VANCOMYCIN RM: 17 ug/mL

## 2015-04-27 MED ORDER — HEPARIN 1000 UNIT/ML FOR PERITONEAL DIALYSIS
1500.0000 [IU] | INTRAMUSCULAR | Status: DC | PRN
  Administered 2015-04-28 (×2): 1500 [IU] via INTRAPERITONEAL
  Filled 2015-04-27 (×5): qty 1.5

## 2015-04-27 MED ORDER — BARRIER CREAM NON-SPECIFIED
1.0000 "application " | TOPICAL_CREAM | Freq: Two times a day (BID) | TOPICAL | Status: DC | PRN
  Filled 2015-04-27: qty 1

## 2015-04-27 MED ORDER — VANCOMYCIN HCL 10 G IV SOLR
1250.0000 mg | Freq: Once | INTRAVENOUS | Status: AC
  Administered 2015-04-27: 1250 mg via INTRAVENOUS
  Filled 2015-04-27: qty 1250

## 2015-04-27 MED ORDER — DELFLEX-LC/2.5% DEXTROSE 394 MOSM/L IP SOLN
Freq: Every day | INTRAPERITONEAL | Status: DC
  Administered 2015-04-27 – 2015-04-29 (×4): via INTRAPERITONEAL
  Administered 2015-04-30: 3000 mL via INTRAPERITONEAL
  Administered 2015-05-01: 13:00:00 via INTRAPERITONEAL
  Administered 2015-05-01 – 2015-05-02 (×2): 3000 mL via INTRAPERITONEAL

## 2015-04-27 MED ORDER — DELFLEX-LC/1.5% DEXTROSE 346 MOSM/L IP SOLN
Freq: Every day | INTRAPERITONEAL | Status: DC
  Administered 2015-04-27 – 2015-04-28 (×2): via INTRAPERITONEAL
  Administered 2015-04-28: 3000 mL via INTRAPERITONEAL
  Administered 2015-04-29 (×2): via INTRAPERITONEAL
  Administered 2015-04-29: 3000 mL via INTRAPERITONEAL
  Administered 2015-05-01 (×2): via INTRAPERITONEAL
  Administered 2015-05-01: 3000 mL via INTRAPERITONEAL

## 2015-04-27 NOTE — Progress Notes (Signed)
ANTIBIOTIC CONSULT NOTE Pharmacy Consult for vancomycin + ceftazidime  Indication: wound infection  Allergies  Allergen Reactions  . Flu Virus Vaccine     Gets the flu, patient has bad reaction to the flu vaccine.  . Lipitor [Atorvastatin]     Leg cramps    Patient Measurements: Height: 5\' 10"  (177.8 cm) Weight: 236 lb 5.3 oz (107.2 kg) IBW/kg (Calculated) : 73  Vital Signs: Temp: 98.8 F (37.1 C) (11/20 0421) Temp Source: Oral (11/20 0421) BP: 101/46 mmHg (11/20 0421) Pulse Rate: 83 (11/20 0421) Intake/Output from previous day: 11/19 0701 - 11/20 0700 In: [id number] [P.O.:840] Out: 12800  Intake/Output from this shift: Total I/O In: 6480 [P.O.:480; Other:6000] Out: 6600 [Other:6600]  Labs:  Recent Labs  04/26/15 0429  WBC 11.1*  HGB 11.3*  PLT 363  CREATININE 7.11*   Estimated Creatinine Clearance: 11.7 mL/min (by C-G formula based on Cr of 7.11).  Recent Labs  04/25/15 0540 04/27/15 0401  VANCORANDOM 23 17     Assessment: 74 yo m presenting to the ED on 11/16 with nausea and vomiting x 1 month.  Patient has been on clindamycin and cipro for the last month for left great toe discoloration and drainage, but he has not been able to keep any of the abx down. Patient is to be admitted to receive IV abx.  Patient is on PD at home and is anuric.  Now day #5 of abx for wound infection of L great toe. Afebrile, WBC wnl. Received load dose of vancomycin 2g on 11/16.  Vancomycin level this AM = 17 Cultures negative, afebrile Plan for vascular study tomorrow  Goal of Therapy:  Vancomycin trough level 10-15 mcg/ml  Plan:  Continue ceftazidime 500 mg iv Q 24 hours Vancomycin 1250 mg iv x 1 today for random level < 20 Plan to check another VR 4 days from last dose Monitor clinical picture, VR prn F/U C&S, abx deescalation / LOT  Thank you Anette Guarneri, PharmD [phone] 04/27/2015 1:37 PM

## 2015-04-27 NOTE — Care Management Important Message (Signed)
Important Message  Patient Details  Name: Jack Chung MRN: [id number] Date of Birth: 1941/01/08   Medicare Important Message Given:  Yes    [name] [name] 04/27/2015, 10:11 AM

## 2015-04-27 NOTE — Progress Notes (Signed)
   VASCULAR SURGERY ASSESSMENT & PLAN:  * I have reviewed the noninvasive arterial Doppler study. The results are discussed below. He does not appear to have adequate circulation for healing of the wound on the left foot. He is now agreeable to proceeding with arteriography tomorrow. This will be performed by Dr. Adele Barthel.  * Preoperative orders written.   SUBJECTIVE: no specific complaints.  PHYSICAL EXAM: Filed Vitals:   04/26/15 0833 04/26/15 1658 04/26/15 2122 04/27/15 0421  BP: 101/43 125/51 139/57 101/46  Pulse: 79 73 71 83  Temp: 99.1 F (37.3 C) 98.2 F (36.8 C) 97.6 F (36.4 C) 98.8 F (37.1 C)  TempSrc: Oral Oral Oral Oral  Resp: 19 18 17 16   Height:      Weight:   236 lb 5.3 oz (107.2 kg)   SpO2: 95% 99% 99% 95%   The wound on the left foot is unchanged.  LABS:  ARTERIAL DOPPLER STUDY: Toe pressure on the left is 7 mmHg suggesting inadequate perfusion for healing. Toe pressure on the right is 41 mmHg. Accurate ABIs could not be obtained as his vessels were calcified and not compressible.   Lab Results  Component Value Date   WBC 11.1* 04/26/2015   HGB 11.3* 04/26/2015   HCT 34.6* 04/26/2015   MCV 96.4 04/26/2015   PLT 363 04/26/2015   Lab Results  Component Value Date   CREATININE 7.11* 04/26/2015   Lab Results  Component Value Date   INR 0.95 01/30/2014   CBG (last 3)   Recent Labs  04/26/15 1139 04/26/15 1656 04/26/15 2120  GLUCAP 229* 150* 210*    Principal Problem:   Intractable nausea and vomiting Active Problems:   CHF (congestive heart failure) (HCC)   Sarcoidosis (HCC)   Sleep apnea   ESRD on dialysis ([place])   Gout   CAD (coronary artery disease)   Orthostatic hypotension    Gae Gallop Beeper: [id number] 04/27/2015

## 2015-04-27 NOTE — Progress Notes (Signed)
[facility] PROGRESS NOTE  DECARLO MATSEN [id number] DOB: 03/01/1941 DOA: 04/23/2015 PCP: [name], MD  Assessment/Plan: Intractable nausea vomiting Likely an exacerbation of gastroparesis vs under-dialysis vs infection (left great toe gangrene) Per renal rec's will check gastric emptying study (planned for 11/21) Will continue increased reglan QID 10 mg and follow clinical response; will hold along with PPI for GE. Minimize use of narcotics Esophagogram w/o strictures, but positive delay transit Will continue PPI and tolerating diet, no further nausea or vomiting   Gangrene of the left great toe-blood culture 2, lactate mildly elevated on admission x-rays of the left toe w/o signs of osteomyelitis Ortho recommending amputation, but due to PVD, high risk for not healing Vascular surgery consulted and recommending ABI with TBI and aortogram (last one planned for 11/21), patient in agreement and looking to have test done. Will continue current antibiotics for now Will follow results and further rec's  History of orthostatic hypotension-continue midodrine  Uncontrolled diabetes: with A1C 8.7 Will continue SSI and lantus  Hypokalemia to be corrected by nephrology with PD/supplementation   Chronic CHF (congestive heart failure) (HCC)-patient without any signs and symptoms of fluid overload of exacerbation, most recent EF 0000000, diastolic in nature will follow low sodium diet daily weights Strict intake and output  Hx of Sarcoidosis (HCC)-previously on prednisone; stable and denying any complaints currently   Sleep apnea Not on CPAP Will monitor for need of O2 supplementation  ESRD on dialysis [facility]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 -low calorie diet and increase physical activity discussed with patient   Gout-continue allopurinol  CAD (coronary artery  disease)-multivessel coronary artery disease, medical therapy recommended, followed by [name], status post 3 vessel CABG in 2011 -no CP and no SOB -troponin with flat elevation at 0.09 and no suggesting ACS; most likely due to chronic renal failure -no abnormalities on tele or EKG suggesting ischemia  Pressure ulcer  -just redness for now -will apply barrier cream, constant reposition and overlay mattress   Code Status: Full Family Communication: wife at bedside  Disposition Plan: to be determine; patient still nauseous and with ongoing changes on his left great toe that might require amputation. Continue antibiotics, follow vascular and ortho rec's     Consultants:  Renal service  Orthopedic service   Vascular surgery  Procedures:  See below for x-ray reports   Gastric empty study: planned for 11/21  ABI: pending   Aortogram with leg run off: planned for 11/21  Antibiotics:  vanc and fortaz 11/16  HPI/Subjective: Afebrile, no CP and no SOB. Patient denies further nausea or vomiting; tolerating PD And diet here w/o problems.   Objective: Filed Vitals:   04/27/15 0421  BP: 101/46  Pulse: 83  Temp: 98.8 F (37.1 C)  Resp: 16    Intake/Output Summary (Last 24 hours) at 04/27/15 1445 Last data filed at 04/27/15 1405  Gross per 24 hour  Intake  12840 ml  Output   9800 ml  Net   3040 ml   Filed Weights   04/23/15 2054 04/24/15 0440 04/26/15 2122  Weight: 103.8 kg (228 lb 13.4 oz) 103.8 kg (228 lb 13.4 oz) 107.2 kg (236 lb 5.3 oz)    Exam:   General:  Reports no CP and no SOB. No further nausea or vomiting. Tolerating diet ok. Afebrile and complaining of some skin discomfort on his left decubitus area.  Cardiovascular: S1  and S2, no rubs or gallops  Respiratory: good air movement, no wheezing  Abdomen: soft, NT, ND, positive BS  Musculoskeletal: no swelling, LLE with necrotic changes and scar on tip of his great toe; denies pain and no discharge  appreciated; denies pain  Data Reviewed: Basic Metabolic Panel:  Recent Labs Lab 04/23/15 1446 04/23/15 1810 04/23/15 2130 04/24/15 0550 04/26/15 0429  NA 131*  --   --  132* 129*  K 3.0*  --   --  3.0* 3.0*  CL 91*  --   --  94* 91*  CO2 24  --   --  26 24  GLUCOSE 274*  --   --  185* 134*  BUN 44*  --   --  45* 33*  CREATININE 7.17*  --  7.20* 7.23* 7.11*  CALCIUM 8.8*  --   --  7.9* 7.4*  MG  --  1.8  --   --   --   PHOS  --   --   --   --  4.5   Liver Function Tests:  Recent Labs Lab 04/23/15 1446 04/24/15 0550 04/26/15 0429  AST 36 32  --   ALT 26 21  --   ALKPHOS 128* 98  --   BILITOT 0.5 0.5  --   PROT 7.7 6.3*  --   ALBUMIN 2.7* 2.2* 2.1*    Recent Labs Lab 04/23/15 1446  LIPASE 29   CBC:  Recent Labs Lab 04/23/15 1446 04/24/15 0550 04/26/15 0429  WBC 10.1 9.8 11.1*  HGB 13.1 11.9* 11.3*  HCT 40.7 35.6* 34.6*  MCV 97.4 96.0 96.4  PLT 365 364 363   Cardiac Enzymes:  Recent Labs Lab 04/23/15 2130 04/23/15 2330 04/24/15 0550  TROPONINI 0.07* 0.06* 0.09*   CBG:  Recent Labs Lab 04/26/15 1139 04/26/15 1656 04/26/15 2120 04/27/15 0812 04/27/15 1158  GLUCAP 229* 150* 210* 170* 226*    Recent Results (from the past 240 hour(s))  Culture, blood (routine x 2)     Status: None (Preliminary result)   Collection Time: 04/23/15  3:39 PM  Result Value Ref Range Status   Specimen Description BLOOD LEFT ARM  Final   Special Requests IN PEDIATRIC BOTTLE 3CC  Final   Culture NO GROWTH 4 DAYS  Final   Report Status PENDING  Incomplete  Culture, blood (routine x 2)     Status: None (Preliminary result)   Collection Time: 04/23/15  9:30 PM  Result Value Ref Range Status   Specimen Description BLOOD LEFT HAND  Final   Special Requests BOTTLES DRAWN AEROBIC ONLY 5CC  Final   Culture NO GROWTH 4 DAYS  Final   Report Status PENDING  Incomplete  Culture, body fluid-bottle     Status: None (Preliminary result)   Collection Time: 04/23/15 11:45  PM  Result Value Ref Range Status   Specimen Description FLUID PERITONEAL  Final   Special Requests BOTTLES DRAWN AEROBIC AND ANAEROBIC 10CC  Final   Culture NO GROWTH 3 DAYS  Final   Report Status PENDING  Incomplete  Gram stain     Status: None   Collection Time: 04/23/15 11:45 PM  Result Value Ref Range Status   Specimen Description FLUID PERITONEAL  Final   Special Requests NONE  Final   Gram Stain NO WBC SEEN NO ORGANISMS SEEN   Final   Report Status 04/24/2015 FINAL  Final     Studies: No results found.  Scheduled Meds: . allopurinol  100  mg Oral Daily  . aspirin  325 mg Oral Daily  . calcium carbonate  800 mg of elemental calcium Oral TID  . cefTAZidime (FORTAZ)  IV  500 mg Intravenous Q24H  . heparin  5,000 Units Subcutaneous 3 times per day  . insulin aspart  0-9 Units Subcutaneous TID WC  . insulin glargine  40 Units Subcutaneous QHS  . levothyroxine  112 mcg Oral QAC breakfast  . metoCLOPramide  10 mg Oral TID AC & HS  . midodrine  10 mg Oral TID  . multivitamin  1 tablet Oral QHS  . potassium chloride  20 mEq Oral Daily  . sodium chloride  3 mL Intravenous Q12H  . sodium chloride  3 mL Intravenous Q12H  . vancomycin  1,250 mg Intravenous Once   Continuous Infusions:   Principal Problem:   Intractable nausea and vomiting Active Problems:   CHF (congestive heart failure) (HCC)   Sarcoidosis (HCC)   Sleep apnea   ESRD on dialysis ([place])   Gout   CAD (coronary artery disease)   Orthostatic hypotension    Time spent: 35 minutes     Barton Dubois  [facility] Pager [phone]. If 7PM-7AM, please contact night-coverage at [email or web address], password [facility] 04/27/2015, 2:45 PM  LOS: 4 days

## 2015-04-28 ENCOUNTER — Ambulatory Visit (HOSPITAL_COMMUNITY): Admission: RE | Admit: 2015-04-28 | Payer: Medicare Other | Source: Ambulatory Visit | Admitting: Vascular Surgery

## 2015-04-28 ENCOUNTER — Inpatient Hospital Stay (HOSPITAL_COMMUNITY): Payer: Medicare Other

## 2015-04-28 ENCOUNTER — Encounter (HOSPITAL_COMMUNITY)
Admission: EM | Disposition: A | Discharge status: Home Health | Payer: Self-pay | Source: Home / Self Care | Attending: Internal Medicine

## 2015-04-28 HISTORY — PX: PERIPHERAL VASCULAR CATHETERIZATION: SHX172C

## 2015-04-28 LAB — GLUCOSE, CAPILLARY
GLUCOSE-CAPILLARY: 210 mg/dL — AB (ref 65–99)
GLUCOSE-CAPILLARY: 342 mg/dL — AB (ref 65–99)
GLUCOSE-CAPILLARY: 57 mg/dL — AB (ref 65–99)
GLUCOSE-CAPILLARY: 65 mg/dL (ref 65–99)
GLUCOSE-CAPILLARY: 81 mg/dL (ref 65–99)
Glucose-Capillary: 50 mg/dL — ABNORMAL LOW (ref 65–99)
Glucose-Capillary: 151 mg/dL — ABNORMAL HIGH (ref 65–99)

## 2015-04-28 LAB — BASIC METABOLIC PANEL
ANION GAP: 11 (ref 5–15)
BUN: 31 mg/dL — ABNORMAL HIGH (ref 6–20)
CALCIUM: 7.3 mg/dL — AB (ref 8.9–10.3)
CO2: 26 mmol/L (ref 22–32)
CREATININE: 6.21 mg/dL — AB (ref 0.61–1.24)
Chloride: 92 mmol/L — ABNORMAL LOW (ref 101–111)
GFR, EST AFRICAN AMERICAN: 9 mL/min — AB (ref >60)
GFR, EST NON AFRICAN AMERICAN: 8 mL/min — AB (ref >60)
Glucose, Bld: 133 mg/dL — ABNORMAL HIGH (ref 65–99)
Potassium: 2.8 mmol/L — ABNORMAL LOW (ref 3.5–5.1)
SODIUM: 129 mmol/L — AB (ref 135–145)

## 2015-04-28 LAB — CBC
HEMATOCRIT: 34.4 % — AB (ref 39.0–52.0)
HEMOGLOBIN: 11.4 g/dL — AB (ref 13.0–17.0)
MCH: 31.8 pg (ref 26.0–34.0)
MCHC: 33.1 g/dL (ref 30.0–36.0)
MCV: 95.8 fL (ref 78.0–100.0)
Platelets: 333 10*3/uL (ref 150–400)
RBC: 3.59 MIL/uL — ABNORMAL LOW (ref 4.22–5.81)
RDW: 13.8 % (ref 11.5–15.5)
WBC: 12.7 10*3/uL — ABNORMAL HIGH (ref 4.0–10.5)

## 2015-04-28 LAB — CREATININE, SERUM
CREATININE: 6.34 mg/dL — AB (ref 0.61–1.24)
GFR calc Af Amer: 9 mL/min — ABNORMAL LOW (ref >60)
GFR, EST NON AFRICAN AMERICAN: 8 mL/min — AB (ref >60)

## 2015-04-28 LAB — CULTURE, BLOOD (ROUTINE X 2)
Culture: NO GROWTH
Culture: NO GROWTH

## 2015-04-28 SURGERY — ABDOMINAL AORTOGRAM W/LOWER EXTREMITY
Anesthesia: LOCAL

## 2015-04-28 MED ORDER — SODIUM CHLORIDE 0.9 % IJ SOLN: 3.0000 mL | INTRAMUSCULAR | Status: DC | PRN

## 2015-04-28 MED ORDER — IODIXANOL 320 MG/ML IV SOLN
INTRAVENOUS | Status: DC | PRN
  Administered 2015-04-28: 110 mL via INTRAVENOUS

## 2015-04-28 MED ORDER — ACETAMINOPHEN 325 MG PO TABS: 650.0000 mg | ORAL_TABLET | ORAL | Status: DC | PRN

## 2015-04-28 MED ORDER — DEXTROSE 50 % IV SOLN
INTRAVENOUS | Status: AC
  Filled 2015-04-28: qty 50

## 2015-04-28 MED ORDER — SODIUM CHLORIDE 0.9 % IV SOLN: 250.0000 mL | INTRAVENOUS | Status: DC | PRN

## 2015-04-28 MED ORDER — FENTANYL CITRATE (PF) 100 MCG/2ML IJ SOLN
INTRAMUSCULAR | Status: DC | PRN
  Administered 2015-04-28: 50 ug via INTRAVENOUS

## 2015-04-28 MED ORDER — HEPARIN SODIUM (PORCINE) 5000 UNIT/ML IJ SOLN: 5000.0000 [IU] | Freq: Three times a day (TID) | INTRAMUSCULAR | Status: DC

## 2015-04-28 MED ORDER — MORPHINE SULFATE (PF) 2 MG/ML IV SOLN: 2.0000 mg | INTRAVENOUS | Status: DC | PRN

## 2015-04-28 MED ORDER — LIDOCAINE HCL (PF) 1 % IJ SOLN
INTRAMUSCULAR | Status: DC | PRN
  Administered 2015-04-28: 12 mL

## 2015-04-28 MED ORDER — POTASSIUM CHLORIDE CRYS ER 20 MEQ PO TBCR
20.0000 meq | EXTENDED_RELEASE_TABLET | Freq: Every day | ORAL | Status: DC
  Administered 2015-04-29: 20 meq via ORAL
  Filled 2015-04-28: qty 1

## 2015-04-28 MED ORDER — METOCLOPRAMIDE HCL 10 MG PO TABS
10.0000 mg | ORAL_TABLET | Freq: Three times a day (TID) | ORAL | Status: AC
  Administered 2015-04-28 – 2015-05-02 (×13): 10 mg via ORAL
  Filled 2015-04-28 (×14): qty 1

## 2015-04-28 MED ORDER — SODIUM CHLORIDE 0.9 % IJ SOLN
3.0000 mL | Freq: Two times a day (BID) | INTRAMUSCULAR | Status: DC
  Administered 2015-04-29 – 2015-05-02 (×3): 3 mL via INTRAVENOUS

## 2015-04-28 MED ORDER — OXYCODONE-ACETAMINOPHEN 5-325 MG PO TABS
1.0000 | ORAL_TABLET | Freq: Four times a day (QID) | ORAL | Status: DC | PRN
  Administered 2015-05-01 – 2015-05-02 (×2): 2 via ORAL
  Filled 2015-04-28 (×2): qty 2

## 2015-04-28 MED ORDER — PANTOPRAZOLE SODIUM 40 MG PO TBEC
40.0000 mg | DELAYED_RELEASE_TABLET | Freq: Every day | ORAL | Status: DC
  Administered 2015-04-29 – 2015-05-01 (×3): 40 mg via ORAL
  Filled 2015-04-28 (×3): qty 1

## 2015-04-28 MED ORDER — TECHNETIUM TC 99M SULFUR COLLOID: 2.0000 | Freq: Once | INTRAVENOUS | Status: DC | PRN

## 2015-04-28 MED ORDER — DEXTROSE 50 % IV SOLN
INTRAVENOUS | Status: AC
  Administered 2015-04-28: 25 mL
  Filled 2015-04-28: qty 50

## 2015-04-28 MED ORDER — INSULIN ASPART 100 UNIT/ML ~~LOC~~ SOLN
3.0000 [IU] | Freq: Three times a day (TID) | SUBCUTANEOUS | Status: DC
  Administered 2015-04-29 – 2015-05-02 (×8): 3 [IU] via SUBCUTANEOUS

## 2015-04-28 MED ORDER — POTASSIUM CHLORIDE 10 MEQ/100ML IV SOLN
10.0000 meq | INTRAVENOUS | Status: AC
  Administered 2015-04-28 (×3): 10 meq via INTRAVENOUS
  Filled 2015-04-28 (×3): qty 100

## 2015-04-28 MED ORDER — ONDANSETRON HCL 4 MG/2ML IJ SOLN: 4.0000 mg | Freq: Four times a day (QID) | INTRAMUSCULAR | Status: DC | PRN

## 2015-04-28 MED ORDER — MIDAZOLAM HCL 2 MG/2ML IJ SOLN
INTRAMUSCULAR | Status: DC | PRN
  Administered 2015-04-28: 1 mg via INTRAVENOUS

## 2015-04-28 SURGICAL SUPPLY — 13 items
CATH OMNI FLUSH 5F 65CM (CATHETERS) ×2 IMPLANT
CATH STRAIGHT 5FR 65CM (CATHETERS) ×2 IMPLANT
COVER PRB 48X5XTLSCP FOLD TPE (BAG) ×1 IMPLANT
COVER PROBE 5X48 (BAG) ×1
DEVICE TORQUE .025-.038 (MISCELLANEOUS) ×2 IMPLANT
GUIDEWIRE ANGLED .035X150CM (WIRE) ×2 IMPLANT
KIT MICROINTRODUCER STIFF 5F (SHEATH) ×2 IMPLANT
KIT PV (KITS) ×2 IMPLANT
SHEATH PINNACLE 5F 10CM (SHEATH) ×2 IMPLANT
SYR MEDRAD MARK V 150ML (SYRINGE) ×2 IMPLANT
TRANSDUCER W/STOPCOCK (MISCELLANEOUS) ×2 IMPLANT
TRAY PV CATH (CUSTOM PROCEDURE TRAY) ×2 IMPLANT
WIRE BENTSON .035X145CM (WIRE) ×2 IMPLANT

## 2015-04-28 NOTE — Progress Notes (Signed)
[facility] Progress Note  Assessment/Plan: 1. Nausea/ vomiting - x 2 weeks. Hx of similar problems earlier this year. 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 We are giving him 18L per day here- he is an average to slow transporter so is on the appropriate modality with CAPD (tested last in 2014). Eating solids last night and today without problems. Swallowing study neg- did have brief retention of tablet in distal esophagus but then passed into stomach. GES planned for 11/21.  2. ESRD f/u Dr Joelyn Oms, cont PD - continue current CAPD, Na declining - watch; K low - on KCl 20 per day - resume. PD fld clear, no fibrin.  3. DM 32 yrs Per primary  4. 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 CAD hx CABG 2011 7. Left great toe ischemia/gangrene - per primary / ortho. On IV Fortaz and Vanc. Vascular consulted. For arteriogram/possible stent today per Dr. Bridgett Larsson.  8. Anemia - Hgb trending down 11.3- no ESA yet 9. Nutrition - alb low 2.1 - need ^ protein diet, but recent issues with N and V, add prostat/nepro- Carb mod diet. NPO at present.  10. Hypokalemia: K+ 2.8. Will give three runs KCL 10 meq over 1 hours and recheck serum K+ this afternoon. [place] today.   [name] NP-C 04/28/2015, 9:15 AM  [facility] [phone]  Subjective: "I haven't had any vomiting in awhile". No C/O pain/SOB at present. Currently draining PD fluid.   Objective Filed Vitals:   04/27/15 2008 04/28/15 0500 04/28/15 0506 04/28/15 0846  BP: 124/50  111/49 126/55  Pulse: 71  78 81  Temp: 98.3 F (36.8 C)  99.1 F (37.3 C) 98.9 F (37.2 C)  TempSrc: Oral  Oral Oral  Resp: 17  16 18   Height:      Weight:  104.6 kg (230 lb 9.6 oz)    SpO2: 98%  96% 99%   Physical Exam General:  chronically ill appearing male, looks older than stated age, NAD.  Heart: S1, S2. RRR. No M/R/G Lungs: Bilateral breaths sounds CTA Abdomen: Soft nontender. PD catheter LUQ, Drsg CDI. No redness at site per RN who just changed drsg.  Extremities: No LE edema. Has infected L great toe, erythema present, no drainage, open to air.    Dialysis Access: PD cath Drsg CDI. RUA AVF + Bruit  Dialysis Orders: CAPD with 5 exchanges per day at 8am, 11am, 2pm, 5pm and overnight dwell at 8pm - followed by Dr. Pearson Grippe who recently increased dwell volume from 2.5L to 3L but pt has yet to receive those fluids. Uses 1.5% then 2.5% x3 and last exchange with 1.5%. EDW is 105kg but currently he's down to 100.5kg because of poor intake.   Additional Objective Labs: Basic Metabolic Panel:  Recent Labs Lab 04/24/15 0550 04/26/15 0429 04/28/15 0438  NA 132* 129* 129*  K 3.0* 3.0* 2.8*  CL 94* 91* 92*  CO2 26 24 26   GLUCOSE 185* 134* 133*  BUN 45* 33* 31*  CREATININE 7.23* 7.11* 6.21*  CALCIUM 7.9* 7.4* 7.3*  PHOS  --  4.5  --    Liver Function Tests:  Recent Labs Lab 04/23/15 1446 04/24/15 0550 04/26/15 0429  AST 36 32  --  ALT 26 21  --   ALKPHOS 128* 98  --   BILITOT 0.5 0.5  --   PROT 7.7 6.3*  --   ALBUMIN 2.7* 2.2* 2.1*    Recent Labs Lab 04/23/15 1446  LIPASE 29   CBC:  Recent Labs Lab 04/23/15 1446 04/24/15 0550 04/26/15 0429  WBC 10.1 9.8 11.1*  HGB 13.1 11.9* 11.3*  HCT 40.7 35.6* 34.6*  MCV 97.4 96.0 96.4  PLT 365 364 363   Blood Culture    Component Value Date/Time   SDES FLUID PERITONEAL 04/23/2015 2345   SDES FLUID PERITONEAL 04/23/2015 2345   SPECREQUEST BOTTLES DRAWN AEROBIC AND ANAEROBIC 10CC 04/23/2015 2345   SPECREQUEST NONE 04/23/2015 2345   CULT NO GROWTH 3 DAYS 04/23/2015 2345   REPTSTATUS PENDING 04/23/2015 2345   REPTSTATUS 04/24/2015 FINAL 04/23/2015 2345    Cardiac Enzymes:  Recent Labs Lab 04/23/15 2130 04/23/15 2330 04/24/15 0550   TROPONINI 0.07* 0.06* 0.09*   CBG:  Recent Labs Lab 04/27/15 1633 04/27/15 2006 04/28/15 0059 04/28/15 0738 04/28/15 0900  GLUCAP 277* 351* 210* 65 81   Iron Studies: No results for input(s): IRON, TIBC, TRANSFERRIN, FERRITIN in the last 72 hours. @lablastinr3 @ Studies/Results: No results found. Medications:   . allopurinol  100 mg Oral Daily  . aspirin  325 mg Oral Daily  . calcium carbonate  800 mg of elemental calcium Oral TID  . cefTAZidime (FORTAZ)  IV  500 mg Intravenous Q24H  . dialysis solution 1.5% low-MG/low-CA   Intraperitoneal 5 X Daily  . dialysis solution 2.5% low-MG/low-CA   Intraperitoneal 5 X Daily  . heparin  5,000 Units Subcutaneous 3 times per day  . insulin aspart  0-9 Units Subcutaneous TID WC  . insulin glargine  40 Units Subcutaneous QHS  . levothyroxine  112 mcg Oral QAC breakfast  . midodrine  10 mg Oral TID  . multivitamin  1 tablet Oral QHS  . potassium chloride  20 mEq Oral Daily  . sodium chloride  3 mL Intravenous Q12H  . sodium chloride  3 mL Intravenous Q12H      I have seen and examined this patient and agree with plan as outlined by Juanell Fairly, NP.  Mr. Olander feels much better today and has not had any N/V for 3 days.  Tolerating PD well.  Plan for IV runs of KCl due to hypokalemia and will likely need increased dose of KCl po.  Continue to follow K levels. Keylin Ferryman A,MD 04/28/2015 12:30 PM

## 2015-04-28 NOTE — Progress Notes (Signed)
[facility] PROGRESS NOTE  ASHTAN STIPES [id number] DOB: [birth year] DOA: 04/23/2015 PCP: [name], MD  Assessment/Plan: Intractable nausea vomiting Likely an exacerbation of gastroparesis vs under-dialysis vs infection (left great toe gangrene) Per renal rec's will check gastric emptying study (planned for later today 11/21) Will continue increased reglan QID 10 mg and follow clinical response Minimize use of narcotics Esophagogram w/o strictures, but positive delay transit Will continue PPI and reglan; tolerating diet, no further nausea or vomiting   Gangrene of the left great toe-blood culture 2, lactate mildly elevated on admission x-rays of the left toe w/o signs of osteomyelitis Ortho recommending amputation, but due to PVD, high risk for not healing Vascular surgery consulted and recommending ABI with TBI and aortogram (last one planned for today 11/21), patient in agreement and looking to have test done. Will continue current antibiotics for now Will follow results and further rec's  History of orthostatic hypotension-continue midodrine  Uncontrolled diabetes: with A1C 8.7 Will continue SSI and lantus; for better control will add meal coverage   Hypokalemia to be corrected by nephrology with PD/supplementation   Chronic CHF (congestive heart failure) (HCC)-patient without any signs and symptoms of fluid overload of exacerbation, most recent EF 0000000, diastolic in nature will follow low sodium diet daily weights Strict intake and output  Hx of Sarcoidosis (HCC)-previously on prednisone; stable and denying any complaints currently   Sleep apnea Not on CPAP Will monitor for need of O2 supplementation  ESRD on dialysis [facility]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 -low calorie diet and increase physical activity discussed with patient   Gout-continue  allopurinol  CAD (coronary artery disease)-multivessel coronary artery disease, medical therapy recommended, followed by [name], status post 3 vessel CABG in 2011 -no CP and no SOB -troponin with flat elevation at 0.09 and no suggesting ACS; most likely due to chronic renal failure -no abnormalities on tele or EKG suggesting ischemia  Pressure ulcer  -just redness for now -will apply barrier cream, constant reposition and overlay mattress   Code Status: Full Family Communication: wife at bedside  Disposition Plan: to be determine; patient still nauseous and with ongoing changes on his left great toe that might require amputation. Continue antibiotics, follow vascular and ortho rec's     Consultants:  Renal service  Orthopedic service   Vascular surgery  Procedures:  See below for x-ray reports   Gastric empty study: planned for 11/21  ABI: pending   Aortogram with leg run off: planned for 11/21  Antibiotics:  vanc and fortaz 11/16  HPI/Subjective: Afebrile, no CP and no SOB. Patient denies further nausea or vomiting; tolerating PD And diet here w/o problems. Gastric empty study and aortogram to be done later today  Objective: Filed Vitals:   04/28/15 0506 04/28/15 0846  BP: 111/49 126/55  Pulse: 78 81  Temp: 99.1 F (37.3 C) 98.9 F (37.2 C)  Resp: 16 18    Intake/Output Summary (Last 24 hours) at 04/28/15 1534 Last data filed at 04/28/15 1426  Gross per 24 hour  Intake   6670 ml  Output   6600 ml  Net     70 ml   Filed Weights   04/24/15 0440 04/26/15 2122 04/28/15 0500  Weight: 103.8 kg (228 lb 13.4 oz) 107.2 kg (236 lb 5.3 oz) 104.6 kg (230 lb 9.6 oz)    Exam:   General:  Reports no CP  and no SOB. No further nausea or vomiting. No fever. Wants to go home for Thanksgiving   Cardiovascular: S1 and S2, no rubs or gallops  Respiratory: good air movement, no wheezing  Abdomen: soft, NT, ND, positive BS  Musculoskeletal: no swelling, LLE with  necrotic changes and scar on tip of his great toe; denies pain and no discharge appreciated; denies pain  Data Reviewed: Basic Metabolic Panel:  Recent Labs Lab 04/23/15 1446 04/23/15 1810 04/23/15 2130 04/24/15 0550 04/26/15 0429 04/28/15 0438  NA 131*  --   --  132* 129* 129*  K 3.0*  --   --  3.0* 3.0* 2.8*  CL 91*  --   --  94* 91* 92*  CO2 24  --   --  26 24 26   GLUCOSE 274*  --   --  185* 134* 133*  BUN 44*  --   --  45* 33* 31*  CREATININE 7.17*  --  7.20* 7.23* 7.11* 6.21*  CALCIUM 8.8*  --   --  7.9* 7.4* 7.3*  MG  --  1.8  --   --   --   --   PHOS  --   --   --   --  4.5  --    Liver Function Tests:  Recent Labs Lab 04/23/15 1446 04/24/15 0550 04/26/15 0429  AST 36 32  --   ALT 26 21  --   ALKPHOS 128* 98  --   BILITOT 0.5 0.5  --   PROT 7.7 6.3*  --   ALBUMIN 2.7* 2.2* 2.1*    Recent Labs Lab 04/23/15 1446  LIPASE 29   CBC:  Recent Labs Lab 04/23/15 1446 04/24/15 0550 04/26/15 0429  WBC 10.1 9.8 11.1*  HGB 13.1 11.9* 11.3*  HCT 40.7 35.6* 34.6*  MCV 97.4 96.0 96.4  PLT 365 364 363   Cardiac Enzymes:  Recent Labs Lab 04/23/15 2130 04/23/15 2330 04/24/15 0550  TROPONINI 0.07* 0.06* 0.09*   CBG:  Recent Labs Lab 04/27/15 1633 04/27/15 2006 04/28/15 0059 04/28/15 0738 04/28/15 0900  GLUCAP 277* 351* 210* 65 81    Recent Results (from the past 240 hour(s))  Culture, blood (routine x 2)     Status: None   Collection Time: 04/23/15  3:39 PM  Result Value Ref Range Status   Specimen Description BLOOD LEFT ARM  Final   Special Requests IN PEDIATRIC BOTTLE 3CC  Final   Culture NO GROWTH 5 DAYS  Final   Report Status 04/28/2015 FINAL  Final  Culture, blood (routine x 2)     Status: None   Collection Time: 04/23/15  9:30 PM  Result Value Ref Range Status   Specimen Description BLOOD LEFT HAND  Final   Special Requests BOTTLES DRAWN AEROBIC ONLY 5CC  Final   Culture NO GROWTH 5 DAYS  Final   Report Status 04/28/2015 FINAL   Final  Culture, body fluid-bottle     Status: None (Preliminary result)   Collection Time: 04/23/15 11:45 PM  Result Value Ref Range Status   Specimen Description FLUID PERITONEAL  Final   Special Requests BOTTLES DRAWN AEROBIC AND ANAEROBIC 10CC  Final   Culture NO GROWTH 4 DAYS  Final   Report Status PENDING  Incomplete  Gram stain     Status: None   Collection Time: 04/23/15 11:45 PM  Result Value Ref Range Status   Specimen Description FLUID PERITONEAL  Final   Special Requests NONE  Final  Gram Stain NO WBC SEEN NO ORGANISMS SEEN   Final   Report Status 04/24/2015 FINAL  Final     Studies: Nm Gastric Emptying  04/28/2015  CLINICAL DATA:  Recurrent vomiting. EXAM: NUCLEAR MEDICINE GASTRIC EMPTYING SCAN TECHNIQUE: After oral ingestion of radiolabeled meal, sequential abdominal images were obtained for 4 hours. Percentage of activity emptying the stomach was calculated at 1 hour, 2 hour, 3 hour, and 4 hours. RADIOPHARMACEUTICALS:  2.0 mCi Tc-14m MDP labeled sulfur colloid orally COMPARISON:  None. FINDINGS: Expected location of the stomach in the left upper quadrant. Ingested meal empties the stomach gradually over the course of the study. 44% emptied at 1 hr ( normal >= 10%) 45% emptied at 2 hr ( normal >= 40%) 47% emptied at 3 hr ( normal >= 70%) 60% emptied at 4 hr ( normal >= 90%) IMPRESSION: Delayed gastric emptying study. Electronically Signed   By: Marijo Conception, M.D.   On: 04/28/2015 15:23    Scheduled Meds: . allopurinol  100 mg Oral Daily  . aspirin  325 mg Oral Daily  . calcium carbonate  800 mg of elemental calcium Oral TID  . cefTAZidime (FORTAZ)  IV  500 mg Intravenous Q24H  . dialysis solution 1.5% low-MG/low-CA   Intraperitoneal 5 X Daily  . dialysis solution 2.5% low-MG/low-CA   Intraperitoneal 5 X Daily  . heparin  5,000 Units Subcutaneous 3 times per day  . insulin aspart  0-9 Units Subcutaneous TID WC  . insulin aspart  3 Units Subcutaneous TID WC  .  insulin glargine  40 Units Subcutaneous QHS  . levothyroxine  112 mcg Oral QAC breakfast  . metoCLOPramide  10 mg Oral TID AC & HS  . midodrine  10 mg Oral TID  . multivitamin  1 tablet Oral QHS  . [START ON 04/29/2015] pantoprazole  40 mg Oral Q1200  . [START ON 04/29/2015] potassium chloride  20 mEq Oral Daily  . sodium chloride  3 mL Intravenous Q12H  . sodium chloride  3 mL Intravenous Q12H   Continuous Infusions:   Principal Problem:   Intractable nausea and vomiting Active Problems:   CHF (congestive heart failure) (HCC)   Sarcoidosis (HCC)   Sleep apnea   ESRD on dialysis ([place])   Gout   CAD (coronary artery disease)   Orthostatic hypotension    Time spent: 35 minutes     Barton Dubois  [facility] Pager [phone]. If 7PM-7AM, please contact night-coverage at [email or web address], password [email or web address] 04/28/2015, 3:34 PM  LOS: 5 days

## 2015-04-28 NOTE — Progress Notes (Addendum)
Drank 8oz apple juice. CBG 47. Asymptomatic.  Dr. Bridgett Larsson made aware.

## 2015-04-28 NOTE — Progress Notes (Signed)
Repeat CBG 45. Alert and oriented. Warm and dry. Eating graham crackers, drinking Sprite.

## 2015-04-28 NOTE — Progress Notes (Addendum)
   Daily Progress Note  Angiography demonstrates extensive calcification of arterial at multiple levels.  On the left side, he has visualized perfusion down to the level of the metatarsals.  No obvious flow is visualized going to the toes.  He only has peroneal runoff on both sides with acceptable collateral flow off the peroneal artery on the left side.  On the right side, the collateral flow off the right peroneal artery is poor.  -  Consider proceeding with left great toe amputation, knowing a TMA may be needed  -  No revascularization possible on either side as there is intact peroneal artery flow to the level of the ankle B  - Findings were communicated to his wife   Adele Barthel, MD [facility] [place]: [phone] Pager: [phone]  04/28/2015, 3:59 PM

## 2015-04-28 NOTE — Progress Notes (Signed)
Inpatient Diabetes Program Recommendations  AACE/ADA: New Consensus Statement on Inpatient Glycemic Control (2015)  Target Ranges:  Prepandial:   less than 140 mg/dL      Peak postprandial:   less than 180 mg/dL (1-2 hours)      Critically ill patients:  140 - 180 mg/dL   Reviewed glycemic patterns over the past few days. Results for Jack Chung, Jack Chung (MRN [id number]) as of 04/28/2015 13:36  Ref. Range 04/27/2015 08:12 04/27/2015 11:58 04/27/2015 16:33 04/27/2015 20:06 04/28/2015 00:59 04/28/2015 07:38 04/28/2015 09:00  Glucose-Capillary Latest Ref Range: 65-99 mg/dL 170 (H) 2 units 226 (H) 3 units 277 (H) 5 units 351 (H) 3 units (HS correction) 210 (H) 65 Hypo 81    The lantus at 40 units has fastings well controlled as well as the cbg's during the day if the po intake is 0-25%.  However, when patient eats a full meal (documented as 100% intake), his glucose levels rise significantly requiring high doses of correction insulin when the tends to cause hypoglycemia. (Please see graph above)  Please consider adding low dose novolog meal coverage of 3 units tidwc -given only if patient eats greater than 50% of meal. This may help prevent the hypoglycemia requiring higher correction doses   Thank you Rosita Kea, RN, MSN, CDE  Diabetes Inpatient Program Office: [phone] Pager: [phone] 8:00 am to 5:00 pm

## 2015-04-28 NOTE — Progress Notes (Signed)
Patient has been off the unit from 10a-5p for procedures. He has missed 2 CAPD exchanges during this time. Dr. Marval Regal notified and he stated this was fine and to just get as many exchanges completed as possible. Will resume normal schedule tomorrow.   Joellen Jersey, RN.

## 2015-04-28 NOTE — Progress Notes (Signed)
Site area: rt groin fa sheath pulled and pressure held by [employer] Prior to Removal:  Level  0 Pressure Applied For:  20 minutes Manual:   yes Patient Status During Pull:  stable Post Pull Site:  Level  0 Post Pull Instructions Given:  yes Post Pull Pulses Present: yes Dressing Applied:   tegaderm   Bedrest begins @  1625 Comments:  0

## 2015-04-29 ENCOUNTER — Other Ambulatory Visit: Payer: Self-pay

## 2015-04-29 ENCOUNTER — Other Ambulatory Visit (HOSPITAL_COMMUNITY): Payer: Self-pay | Admitting: Orthopedic Surgery

## 2015-04-29 ENCOUNTER — Encounter (HOSPITAL_COMMUNITY): Payer: Self-pay | Admitting: Vascular Surgery

## 2015-04-29 ENCOUNTER — Ambulatory Visit: Payer: Medicare Other | Admitting: Podiatry

## 2015-04-29 LAB — CBC
HCT: 33.5 % — ABNORMAL LOW (ref 39.0–52.0)
Hemoglobin: 11.2 g/dL — ABNORMAL LOW (ref 13.0–17.0)
MCH: 32.2 pg (ref 26.0–34.0)
MCHC: 33.4 g/dL (ref 30.0–36.0)
MCV: 96.3 fL (ref 78.0–100.0)
PLATELETS: 352 10*3/uL (ref 150–400)
RBC: 3.48 MIL/uL — ABNORMAL LOW (ref 4.22–5.81)
RDW: 14 % (ref 11.5–15.5)
WBC: 10.1 10*3/uL (ref 4.0–10.5)

## 2015-04-29 LAB — COMPREHENSIVE METABOLIC PANEL
ALT: 33 U/L (ref 17–63)
ANION GAP: 9 (ref 5–15)
AST: 53 U/L — AB (ref 15–41)
Albumin: 2.2 g/dL — ABNORMAL LOW (ref 3.5–5.0)
Alkaline Phosphatase: 135 U/L — ABNORMAL HIGH (ref 38–126)
BUN: 28 mg/dL — ABNORMAL HIGH (ref 6–20)
CHLORIDE: 91 mmol/L — AB (ref 101–111)
CO2: 27 mmol/L (ref 22–32)
Calcium: 7.7 mg/dL — ABNORMAL LOW (ref 8.9–10.3)
Creatinine, Ser: 6.01 mg/dL — ABNORMAL HIGH (ref 0.61–1.24)
GFR, EST AFRICAN AMERICAN: 10 mL/min — AB (ref >60)
GFR, EST NON AFRICAN AMERICAN: 8 mL/min — AB (ref >60)
Glucose, Bld: 180 mg/dL — ABNORMAL HIGH (ref 65–99)
POTASSIUM: 3.2 mmol/L — AB (ref 3.5–5.1)
Sodium: 127 mmol/L — ABNORMAL LOW (ref 135–145)
Total Bilirubin: 0.7 mg/dL (ref 0.3–1.2)
Total Protein: 7.1 g/dL (ref 6.5–8.1)

## 2015-04-29 LAB — CBC WITH DIFFERENTIAL/PLATELET
BASOS ABS: 0.1 10*3/uL (ref 0.0–0.1)
Basophils Relative: 1 %
EOS PCT: 5 %
Eosinophils Absolute: 0.5 10*3/uL (ref 0.0–0.7)
HCT: 35.9 % — ABNORMAL LOW (ref 39.0–52.0)
HEMOGLOBIN: 11.8 g/dL — AB (ref 13.0–17.0)
LYMPHS PCT: 16 %
Lymphs Abs: 1.7 10*3/uL (ref 0.7–4.0)
MCH: 31.8 pg (ref 26.0–34.0)
MCHC: 32.9 g/dL (ref 30.0–36.0)
MCV: 96.8 fL (ref 78.0–100.0)
Monocytes Absolute: 1.1 10*3/uL — ABNORMAL HIGH (ref 0.1–1.0)
Monocytes Relative: 10 %
NEUTROS ABS: 7.4 10*3/uL (ref 1.7–7.7)
NEUTROS PCT: 68 %
PLATELETS: 374 10*3/uL (ref 150–400)
RBC: 3.71 MIL/uL — AB (ref 4.22–5.81)
RDW: 14 % (ref 11.5–15.5)
WBC: 10.8 10*3/uL — AB (ref 4.0–10.5)

## 2015-04-29 LAB — BASIC METABOLIC PANEL
Anion gap: 10 (ref 5–15)
BUN: 30 mg/dL — AB (ref 6–20)
BUN: 29 mg/dL — ABNORMAL HIGH (ref 6–20)
BUN: 30 mg/dL — ABNORMAL HIGH (ref 6–20)
CALCIUM: 7.4 mg/dL — AB (ref 8.9–10.3)
CO2: 26 mmol/L (ref 22–32)
CO2: 25 mmol/L (ref 22–32)
CO2: 20 mmol/L — ABNORMAL LOW (ref 22–32)
Calcium: 7.7 mg/dL — ABNORMAL LOW (ref 8.9–10.3)
Calcium: 7.4 mg/dL — ABNORMAL LOW (ref 8.9–10.3)
Chloride: 91 mmol/L — ABNORMAL LOW (ref 101–111)
Chloride: 90 mmol/L — ABNORMAL LOW (ref 101–111)
Chloride: 93 mmol/L — ABNORMAL LOW (ref 101–111)
Creatinine, Ser: 6.13 mg/dL — ABNORMAL HIGH (ref 0.61–1.24)
Creatinine, Ser: 5.85 mg/dL — ABNORMAL HIGH (ref 0.61–1.24)
Creatinine, Ser: 5.92 mg/dL — ABNORMAL HIGH (ref 0.61–1.24)
GFR calc Af Amer: 10 mL/min — ABNORMAL LOW (ref >60)
GFR calc Af Amer: 10 mL/min — ABNORMAL LOW (ref >60)
GFR calc Af Amer: 10 mL/min — ABNORMAL LOW (ref >60)
GFR, EST NON AFRICAN AMERICAN: 8 mL/min — AB (ref >60)
GLUCOSE: 135 mg/dL — AB (ref 65–99)
POTASSIUM: 2.9 mmol/L — AB (ref 3.5–5.1)
Sodium: 127 mmol/L — ABNORMAL LOW (ref 135–145)
Sodium: 126 mmol/L — ABNORMAL LOW (ref 135–145)
Sodium: 126 mmol/L — ABNORMAL LOW (ref 135–145)

## 2015-04-29 LAB — GLUCOSE, CAPILLARY
GLUCOSE-CAPILLARY: 45 mg/dL — AB (ref 65–99)
GLUCOSE-CAPILLARY: 139 mg/dL — AB (ref 65–99)
GLUCOSE-CAPILLARY: 231 mg/dL — AB (ref 65–99)
Glucose-Capillary: 47 mg/dL — ABNORMAL LOW (ref 65–99)
Glucose-Capillary: 56 mg/dL — ABNORMAL LOW (ref 65–99)
Glucose-Capillary: 146 mg/dL — ABNORMAL HIGH (ref 65–99)
Glucose-Capillary: 169 mg/dL — ABNORMAL HIGH (ref 65–99)

## 2015-04-29 LAB — CULTURE, BODY FLUID-BOTTLE: CULTURE: NO GROWTH

## 2015-04-29 LAB — BASIC METABOLIC PANEL WITH GFR
Anion gap: 11 (ref 5–15)
Anion gap: 13 (ref 5–15)
GFR calc non Af Amer: 9 mL/min — ABNORMAL LOW (ref >60)
GFR calc non Af Amer: 9 mL/min — ABNORMAL LOW (ref >60)
Glucose, Bld: 203 mg/dL — ABNORMAL HIGH (ref 65–99)
Glucose, Bld: 209 mg/dL — ABNORMAL HIGH (ref 65–99)
Potassium: 3.5 mmol/L (ref 3.5–5.1)
Potassium: 3.9 mmol/L (ref 3.5–5.1)

## 2015-04-29 LAB — CULTURE, BODY FLUID W GRAM STAIN -BOTTLE

## 2015-04-29 LAB — TYPE AND SCREEN
ABO/RH(D): O POS
ANTIBODY SCREEN: NEGATIVE

## 2015-04-29 LAB — ABO/RH: ABO/RH(D): O POS

## 2015-04-29 LAB — PROTIME-INR
INR: 1.02 (ref 0.00–1.49)
PROTHROMBIN TIME: 13.6 s (ref 11.6–15.2)

## 2015-04-29 MED ORDER — POTASSIUM CHLORIDE 10 MEQ/100ML IV SOLN
10.0000 meq | INTRAVENOUS | Status: AC
  Administered 2015-04-29: 10 meq via INTRAVENOUS
  Filled 2015-04-29 (×3): qty 100

## 2015-04-29 MED ORDER — CALCIUM CARBONATE ANTACID 500 MG PO CHEW
800.0000 mg | CHEWABLE_TABLET | Freq: Three times a day (TID) | ORAL | Status: DC
  Administered 2015-04-29 – 2015-05-02 (×8): 800 mg via ORAL
  Filled 2015-04-29 (×8): qty 4

## 2015-04-29 MED ORDER — POTASSIUM CHLORIDE CRYS ER 20 MEQ PO TBCR
20.0000 meq | EXTENDED_RELEASE_TABLET | Freq: Two times a day (BID) | ORAL | Status: DC
  Administered 2015-04-29: 20 meq via ORAL
  Filled 2015-04-29: qty 1

## 2015-04-29 MED ORDER — SODIUM CHLORIDE 0.45 % IV SOLN: INTRAVENOUS | Status: DC

## 2015-04-29 MED ORDER — CEFAZOLIN SODIUM-DEXTROSE 2-3 GM-% IV SOLR
2.0000 g | INTRAVENOUS | Status: AC
  Administered 2015-04-30: 2 g via INTRAVENOUS
  Filled 2015-04-29: qty 50

## 2015-04-29 NOTE — Progress Notes (Signed)
Patient ID: Jack Chung, male   DOB: 1941/04/26, 74 y.o.   MRN: [id number] I will evaluate patient later today, anticipate left  1st ray amputation Wednesday.

## 2015-04-29 NOTE — Progress Notes (Addendum)
[facility] Progress Note  Subjective  - Wanting to have surgery on his toe.   Objective Filed Vitals:   04/28/15 2029 04/29/15 0502  BP: 113/56 114/45  Pulse: 78 101  Temp: 98.8 F (37.1 C) 98.3 F (36.8 C)  Resp: 16 17    Intake/Output Summary (Last 24 hours) at 04/29/15 0913 Last data filed at 04/29/15 0845  Gross per 24 hour  Intake   9590 ml  Output   6500 ml  Net   3090 ml   Right groin soft. No hematoma.  Left great toe ulcer.   Assessment/Planning: 74 y.o. male is s/p: arteriogram with bilateral runoff 1 Day Post-Op   Arteriogram yesterday showing multilevel arterial calcifications. Refer to Dr. Lianne Moris progress note from 04/28/15 for further details. There is peroneal runoff bilaterally with acceptable collateral flow off of the peroneal on the left.   Consider proceeding with left great toe amputation with possible need for TMA if amputation does not heal.   Discussed with Dr. Erlinda Hong, who will see patient today and discuss with [name] who may be able to perform surgery tomorrow. Otherwise, he will be scheduled for later this week.   Alvia Grove 04/29/2015 9:13 AM --  Laboratory CBC    Component Value Date/Time   WBC 12.7* 04/28/2015 1708   HGB 11.4* 04/28/2015 1708   HCT 34.4* 04/28/2015 1708   PLT 333 04/28/2015 1708    BMET    Component Value Date/Time   NA 129* 04/28/2015 0438   K 2.8* 04/28/2015 0438   CL 92* 04/28/2015 0438   CO2 26 04/28/2015 0438   GLUCOSE 133* 04/28/2015 0438   BUN 31* 04/28/2015 0438   CREATININE 6.34* 04/28/2015 1708   CREATININE 4.14* 01/30/2014 1328   CALCIUM 7.3* 04/28/2015 0438   GFRNONAA 8* 04/28/2015 1708   GFRAA 9* 04/28/2015 1708    COAG Lab Results  Component Value Date   INR 0.95 01/30/2014   INR 1.12 12/01/2012   INR 1.10 10/31/2012   No results found for: PTT  Antibiotics Anti-infectives    Start     Dose/Rate Route Frequency Ordered Stop   04/27/15 1345  vancomycin  (VANCOCIN) 1,250 mg in sodium chloride 0.9 % 250 mL IVPB     1,250 mg 166.7 mL/hr over 90 Minutes Intravenous  Once 04/27/15 1339 04/27/15 1905   04/25/15 1800  cefTAZidime (FORTAZ) 500 mg in dextrose 5 % 50 mL IVPB     500 mg 100 mL/hr over 30 Minutes Intravenous Every 24 hours 04/25/15 1408     04/24/15 1800  cefTAZidime (FORTAZ) 1 g in dextrose 5 % 50 mL IVPB  Status:  Discontinued     1 g 100 mL/hr over 30 Minutes Intravenous Every 24 hours 04/24/15 1150 04/25/15 1408   04/23/15 1815  vancomycin (VANCOCIN) 2,000 mg in sodium chloride 0.9 % 500 mL IVPB     2,000 mg 250 mL/hr over 120 Minutes Intravenous  Once 04/23/15 1813 04/23/15 2226   04/23/15 1815  cefTAZidime (FORTAZ) 1 g in dextrose 5 % 50 mL IVPB     1 g 100 mL/hr over 30 Minutes Intravenous  Once 04/23/15 1813 04/23/15 1907   04/23/15 1745  vancomycin (VANCOCIN) IVPB 1000 mg/200 mL premix  Status:  Discontinued     1,000 mg 200 mL/hr over 60 Minutes Intravenous  Once 04/23/15 1740 04/23/15 [place], PA-C [facility] Office: [phone] Pager: [phone]  04/29/2015 9:13 AM   Addendum  I agree with the physician assistant's findings.  Available as need.  Adele Barthel, MD [facility] [place] Office: [phone] Pager: [phone]  04/29/2015, 2:22 PM

## 2015-04-29 NOTE — Progress Notes (Signed)
[facility] PROGRESS NOTE  Jack Chung [id number] DOB: Oct 21, 1940 DOA: 04/23/2015 PCP: Jack Neer, MD  Brief summary 74 y.o. 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 Per Jack Chung, he has been unable to tolerate food, for the past month. He states that whenever he eats or drinks he will throw it up a few hours later. He does complain of some discomfort in the LUQ prior to emesis but denies any abdominal pain. [facility] foot great toe discoloration/drainage for the last 1 month. Patient has not been able to keep any of his antibiotics down. He was started on clindamycin and ciprofloxacin but was unable to keep his pills down. Patient admitted and found to have gastroparesis and will require amputation of his left great toe. No further nausea or vomiting and surgery planned for 11/23 by Jack Chung.  Assessment/Plan: Intractable nausea vomiting -Likely an exacerbation of gastroparesis vs under-dialysis vs infection (left great toe gangrene) -Per renal rec's gastric emptying study checked and has confirmed gastroparesis  -Will continue increased reglan QID 10 mg and PPI  -Minimize use of narcotics -Esophagogram also performed during this admission and demonstrated no strictures, but positive delay transit  Gangrene of the left great toe-blood culture 2, lactate mildly elevated on admission -x-rays of the left toe w/o signs of osteomyelitis -Ortho recommended evaluation by vascular surgery for safer level of amputation. Plan is for surgery on 11/23 -appreciate Vascular surgery consultation; ABI with TBI and aortogram done (demonstrating severe calcifications and through  collaterals perfusion only up to his Metatarsal area) -plans is to proceed with great toe amputation trough MTP joint on 11/23; patient in agreement and looking to have surgery  -Will continue current antibiotics for now -Will follow outcome and further rec's  History of orthostatic hypotension-continue midodrine  Uncontrolled diabetes: with A1C 8.7 -Will continue SSI and lantus; for better control meal coverage added as recommended by diabetes coordinator service  Hypokalemia corrected by nephrology with PD/supplementation  -K 3.5  Chronic CHF (congestive heart failure) (HCC)-patient without any signs and symptoms of fluid overload of exacerbation, most recent 0000000, diastolic in nature -will follow low sodium diet -daily weights -Strict intake and output  Hx of Sarcoidosis (HCC)-previously on prednisone; stable and denying any complaints currently   Sleep apnea -Not on CPAP -Will monitor for need of O2 supplementation  ESRD on dialysis ([place]) on peritoneal dialysis -Renal service on board for continuation of PD and further rec's on electrolytes repletion  Obesity Body mass index is 34.98 kg/(m^2). -low calorie diet and increase physical activity discussed with patient   Gout-continue allopurinol  CAD (coronary artery disease)-multivessel coronary artery disease, medical therapy recommended, followed by Jack Chung; status post 3 vessel CABG in 2011 -no CP and no SOB -troponin with flat elevation at 0.09 and no suggesting ACS; most likely due to chronic renal failure -no abnormalities on tele or EKG suggesting ischemia -will d/c telemetry  Pressure ulcer  -just redness for now -will continue barrier cream, constant reposition and overlay mattress   Code Status: Full Family Communication: wife at bedside  Disposition Plan: to be determine; patient still nauseous and with ongoing changes on his left great toe that might require amputation. Continue antibiotics,  follow  ortho rec's    Consultants:  Renal service  Orthopedic service   Vascular surgery  Procedures:  See below for x-ray reports   Gastric empty study: positive for gastroparesis  ABI: ABIs indicate probable calcificied vessels. Right great toe pressure is within normal limits. Unable to get left great toe pressure secondary to ulceration, however used 2nd toe. 2nd toe pressure is abnormal.   Aortogram with leg run off: Arteriogram yesterday showing multilevel arterial calcifications.There is peroneal runoff bilaterally with acceptable collateral flow off of the peroneal on the left.   Antibiotics:  vanc and fortaz 11/16  HPI/Subjective: Afebrile, no CP and no SOB. Patient denies further nausea or vomiting; tolerating PD And diet here w/o problems. Gastric empty study positive for gastroparesis. Aortogram demonstrated appropriate circulation up to his MTP joint. Patient wants to go home if amputation unable to be performed on 11/23  Objective: Filed Vitals:   04/29/15 1002 04/29/15 1651  BP: 139/57 130/42  Pulse: 76 74  Temp: 98.8 F (37.1 C) 97.9 F (36.6 C)  Resp: 16 18    Intake/Output Summary (Last 24 hours) at 04/29/15 1838 Last data filed at 04/29/15 1448  Gross per 24 hour  Intake  15820 ml  Output  16500 ml  Net   -680 ml   Filed Weights   04/26/15 2122 04/28/15 0500 04/28/15 2029  Weight: 107.2 kg (236 lb 5.3 oz) 104.6 kg (230 lb 9.6 oz) 105.9 kg (233 lb 7.5 oz)    Exam:   General:  Reports no CP and no SOB. No further nausea or vomiting. No fever. Wants to go home; but after discussing needs and risk, has decided to stay for surgical procedure (left great toe amp; on 11/23)   Cardiovascular: S1 and S2, no rubs or gallops  Respiratory: good air movement, no wheezing  Abdomen: soft, NT, ND, positive BS  Musculoskeletal: no swelling, LLE with necrotic changes and scar on tip of his great toe; denies pain and no discharge appreciated; denies  pain  Data Reviewed: Basic Metabolic Panel:  Recent Labs Lab 04/23/15 1810  04/24/15 0550 04/26/15 0429 04/28/15 0438 04/28/15 1708 04/29/15 1054 04/29/15 1335  NA  --   --  132* 129* 129*  --  127* 126*  K  --   --  3.0* 3.0* 2.8*  --  2.9* 3.5  CL  --   --  94* 91* 92*  --  91* 90*  CO2  --   --  26 24 26   --  26 25  GLUCOSE  --   --  185* 134* 133*  --  135* 203*  BUN  --   --  45* 33* 31*  --  30* 29*  CREATININE  --   < > 7.23* 7.11* 6.21* 6.34* 6.13* 5.85*  CALCIUM  --   --  7.9* 7.4* 7.3*  --  7.4* 7.7*  MG 1.8  --   --   --   --   --   --   --   PHOS  --   --   --  4.5  --   --   --   --   < > = values in this interval not displayed. Liver Function Tests:  Recent Labs Lab 04/23/15 1446 04/24/15 0550 04/26/15 0429  AST 36 32  --   ALT 26 21  --   ALKPHOS 128* 98  --   BILITOT 0.5 0.5  --   PROT  7.7 6.3*  --   ALBUMIN 2.7* 2.2* 2.1*    Recent Labs Lab 04/23/15 1446  LIPASE 29   CBC:  Recent Labs Lab 04/23/15 1446 04/24/15 0550 04/26/15 0429 04/28/15 1708 04/29/15 1054  WBC 10.1 9.8 11.1* 12.7* 10.1  HGB 13.1 11.9* 11.3* 11.4* 11.2*  HCT 40.7 35.6* 34.6* 34.4* 33.5*  MCV 97.4 96.0 96.4 95.8 96.3  PLT 365 364 363 333 352   Cardiac Enzymes:  Recent Labs Lab 04/23/15 2130 04/23/15 2330 04/24/15 0550  TROPONINI 0.07* 0.06* 0.09*   CBG:  Recent Labs Lab 04/28/15 1813 04/28/15 2158 04/29/15 0756 04/29/15 1159 04/29/15 1650  GLUCAP 151* 342* 146* 139* 231*    Recent Results (from the past 240 hour(s))  Culture, blood (routine x 2)     Status: None   Collection Time: 04/23/15  3:39 PM  Result Value Ref Range Status   Specimen Description BLOOD LEFT ARM  Final   Special Requests IN PEDIATRIC BOTTLE 3CC  Final   Culture NO GROWTH 5 DAYS  Final   Report Status 04/28/2015 FINAL  Final  Culture, blood (routine x 2)     Status: None   Collection Time: 04/23/15  9:30 PM  Result Value Ref Range Status   Specimen Description BLOOD LEFT  HAND  Final   Special Requests BOTTLES DRAWN AEROBIC ONLY 5CC  Final   Culture NO GROWTH 5 DAYS  Final   Report Status 04/28/2015 FINAL  Final  Culture, body fluid-bottle     Status: None   Collection Time: 04/23/15 11:45 PM  Result Value Ref Range Status   Specimen Description FLUID PERITONEAL  Final   Special Requests BOTTLES DRAWN AEROBIC AND ANAEROBIC 10CC  Final   Culture NO GROWTH 5 DAYS  Final   Report Status 04/29/2015 FINAL  Final  Gram stain     Status: None   Collection Time: 04/23/15 11:45 PM  Result Value Ref Range Status   Specimen Description FLUID PERITONEAL  Final   Special Requests NONE  Final   Gram Stain NO WBC SEEN NO ORGANISMS SEEN   Final   Report Status 04/24/2015 FINAL  Final     Studies: Nm Gastric Emptying  04/28/2015  CLINICAL DATA:  Recurrent vomiting. EXAM: NUCLEAR MEDICINE GASTRIC EMPTYING SCAN TECHNIQUE: After oral ingestion of radiolabeled meal, sequential abdominal images were obtained for 4 hours. Percentage of activity emptying the stomach was calculated at 1 hour, 2 hour, 3 hour, and 4 hours. RADIOPHARMACEUTICALS:  2.0 mCi Tc-20m MDP labeled sulfur colloid orally COMPARISON:  None. FINDINGS: Expected location of the stomach in the left upper quadrant. Ingested meal empties the stomach gradually over the course of the study. 44% emptied at 1 hr ( normal >= 10%) 45% emptied at 2 hr ( normal >= 40%) 47% emptied at 3 hr ( normal >= 70%) 60% emptied at 4 hr ( normal >= 90%) IMPRESSION: Delayed gastric emptying study. Electronically Signed   By: Marijo Conception, M.D.   On: 04/28/2015 15:23    Scheduled Meds: . allopurinol  100 mg Oral Daily  . aspirin  325 mg Oral Daily  . calcium carbonate  800 mg of elemental calcium Oral TID WC  . cefTAZidime (FORTAZ)  IV  500 mg Intravenous Q24H  . dialysis solution 1.5% low-MG/low-CA   Intraperitoneal 5 X Daily  . dialysis solution 2.5% low-MG/low-CA   Intraperitoneal 5 X Daily  . heparin  5,000 Units  Subcutaneous 3 times per day  . insulin  aspart  0-9 Units Subcutaneous TID WC  . insulin aspart  3 Units Subcutaneous TID WC  . insulin glargine  40 Units Subcutaneous QHS  . levothyroxine  112 mcg Oral QAC breakfast  . metoCLOPramide  10 mg Oral TID AC & HS  . midodrine  10 mg Oral TID  . multivitamin  1 tablet Oral QHS  . pantoprazole  40 mg Oral Q1200  . potassium chloride  20 mEq Oral BID  . sodium chloride  3 mL Intravenous Q12H  . sodium chloride  3 mL Intravenous Q12H  . sodium chloride  3 mL Intravenous Q12H   Continuous Infusions:   Principal Problem:   Intractable nausea and vomiting Active Problems:   CHF (congestive heart failure) (HCC)   Sarcoidosis (HCC)   Sleep apnea   ESRD on dialysis ([place])   Gout   CAD (coronary artery disease)   Orthostatic hypotension    Time spent: 35 minutes     Barton Dubois  [facility] Pager [phone]. If 7PM-7AM, please contact night-coverage at [email or web address], password [facility] 04/29/2015, 6:38 PM  LOS: 6 days

## 2015-04-29 NOTE — Progress Notes (Signed)
Patient to receive 3 runs of potassium this afternoon. K 2.9. Phlebotomy obtained a BMET at approx 1330, in error, before potassium was complete. K 3.5. Renal NP Juanell Fairly notified of this and stated we could hold the remaining 2 runs, recheck BMET at 1800, and then reevaluate. Patient informed of this plan. Will monitor.  Joellen Jersey, RN.

## 2015-04-29 NOTE — Progress Notes (Signed)
Inpatient Diabetes Program Recommendations  AACE/ADA: New Consensus Statement on Inpatient Glycemic Control (2015)  Target Ranges:  Prepandial:   less than 140 mg/dL      Peak postprandial:   less than 180 mg/dL (1-2 hours)      Critically ill patients:  140 - 180 mg/dL   Review of Glycemic Control  Results for Jack Chung, Jack Chung (MRN [id number]) as of 04/29/2015 13:23  Ref. Range 04/28/2015 07:38 04/28/2015 09:00 04/28/2015 16:05 04/28/2015 18:13 04/28/2015 21:58 04/29/2015 07:56 04/29/2015 11:59  Glucose-Capillary Latest Ref Range: 65-99 mg/dL 65-low fasting Was  given 40 units lantus day before 81 No correction needed 47 (L) No correction needed 151 (H) No correction and no meal coverage 342 (H) No HS correction ordered 146 (H) Ok-due to high HS cbg   139 (H) Given 1 unit Correction and 3 units mc    Noted meal coverage was added but has not actually been given yet as the glucose was either too low or patient didn't eat at least 50% of meal.  Fasting glucose is good if patient's cbg is high at HS (342 mg/dL last HS).  Lantus primarily influencing the fasting glucose and should cover only the basal needs.  Inpatient Diabetes Program Recommendations: While here, please continue meal coverage (to give only if patient eats >/= 50%) and sensitive correction tidwc and add HS correction scale, and please decrease lantus to 30-35 units from 40 units  Thank you Rosita Kea, RN, MSN, CDE  Diabetes Inpatient Program Office: [phone] Pager: [phone] 8:00 am to 5:00 pm

## 2015-04-29 NOTE — Progress Notes (Signed)
[facility] Progress Note  Assessment/Plan: 1. Nausea/ vomiting - x 2 weeks. Hx of similar problems earlier this year. 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 We are giving him 18L per day here- he is an average to slow transporter so is on the appropriate modality with CAPD (tested last in 2014). Eating solids last night and today without problems. Swallowing study neg- did have brief retention of tablet in distal esophagus but then passed into stomach. GES 11/21 shows delayed gastric emptying.  2. ESRD f/u Dr Joelyn Oms, cont PD - continue current CAPD, Na declining - watch; K low - on KCl 20 per day - resume. PD fld clear, no fibrin.  3. DM 30 yrs Per primary  4. 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 CAD hx CABG 2011 7. Left great toe ischemia/gangrene - per primary / ortho. On IV Fortaz and Vanc. Vascular consulted. For arteriogram/possible stent today per Dr. Bridgett Larsson.  8. Anemia - Hgb 11.2 Continue to follow CBC. ESA if needed.  9. Nutrition - alb low 2.1 - need ^ protein diet, but recent issues with N and V, add prostat/nepro- Carb mod/renal diet.  10.     Hypokalemia: K+ 2.8 yesterday. Repeat K+ not done yesterday due to being off floor for procedures. Repeat K+ today 2.9. Repeat 3 runs 10 meq KCL/150ml over 1 hours, check BMET at 1800. Increased Kdur to 20 meq PO BID.    [name] NP-C 04/29/2015, 11:40 AM  [facility] [phone]  Subjective: "I'm here. I wonder what kind of excuse I'll get next time for not doing my surgery". Patient angry, states wife was told by Dr. Bridgett Larsson that he'd have his L great amputated today. The surgery has been scheduled tentatively tomorrow. Patient denies pain/discomforts. He misses his dog. Confirmed that it's OK for wife to  bring dog to visit. Patient now in better mood.   Objective Filed Vitals:   04/28/15 1658 04/28/15 2029 04/29/15 0502 04/29/15 1002  BP: 123/67 113/56 114/45 139/57  Pulse: 57 78 101 76  Temp: 98.7 F (37.1 C) 98.8 F (37.1 C) 98.3 F (36.8 C) 98.8 F (37.1 C)  TempSrc: Oral Oral Oral Oral  Resp: 15 16 17 16   Height:      Weight:  105.9 kg (233 lb 7.5 oz)    SpO2: 98% 96% 95% 98%   Physical Exam General: chronically ill appearing male, looks older than stated age, NAD.  Heart: S1, S2. RRR. No M/R/G Lungs: Bilateral breaths sounds CTA/AP Abdomen: Soft nontender. PD catheter LUQ, Drsg CDI.  Extremities: No LE edema. Has infected L great toe, erythema present, no drainage, open to air.   Dialysis Access: PD cath Drsg CDI. RUA AVF + Bruit  Dialysis Orders: CAPD with 5 exchanges per day at 8am, 11am, 2pm, 5pm and overnight dwell at 8pm - followed by Dr. Pearson Grippe who recently increased dwell volume from 2.5L to 3L but pt has yet to receive those fluids. Uses 1.5% then 2.5% x3 and last exchange with 1.5%. EDW is 105kg but currently he's down to 100.5kg because of poor intake.   Additional Objective Labs: Basic Metabolic Panel:  Recent Labs Lab 04/24/15 0550 04/26/15 0429 04/28/15 0438 04/28/15 1708  NA 132* 129* 129*  --  K 3.0* 3.0* 2.8*  --   CL 94* 91* 92*  --   CO2 26 24 26   --   GLUCOSE 185* 134* 133*  --   BUN 45* 33* 31*  --   CREATININE 7.23* 7.11* 6.21* 6.34*  CALCIUM 7.9* 7.4* 7.3*  --   PHOS  --  4.5  --   --    Liver Function Tests:  Recent Labs Lab 04/23/15 1446 04/24/15 0550 04/26/15 0429  AST 36 32  --   ALT 26 21  --   ALKPHOS 128* 98  --   BILITOT 0.5 0.5  --   PROT 7.7 6.3*  --   ALBUMIN 2.7* 2.2* 2.1*    Recent Labs Lab 04/23/15 1446  LIPASE 29   CBC:  Recent Labs Lab 04/23/15 1446 04/24/15 0550 04/26/15 0429 04/28/15 1708 04/29/15 1054  WBC 10.1 9.8 11.1* 12.7* 10.1  HGB 13.1 11.9* 11.3* 11.4* 11.2*  HCT 40.7 35.6*  34.6* 34.4* 33.5*  MCV 97.4 96.0 96.4 95.8 96.3  PLT 365 364 363 333 352   Blood Culture    Component Value Date/Time   SDES FLUID PERITONEAL 04/23/2015 2345   SDES FLUID PERITONEAL 04/23/2015 2345   SPECREQUEST BOTTLES DRAWN AEROBIC AND ANAEROBIC 10CC 04/23/2015 2345   SPECREQUEST NONE 04/23/2015 2345   CULT NO GROWTH 5 DAYS 04/23/2015 2345   REPTSTATUS 04/29/2015 FINAL 04/23/2015 2345   REPTSTATUS 04/24/2015 FINAL 04/23/2015 2345    Cardiac Enzymes:  Recent Labs Lab 04/23/15 2130 04/23/15 2330 04/24/15 0550  TROPONINI 0.07* 0.06* 0.09*   CBG:  Recent Labs Lab 04/28/15 1653 04/28/15 1718 04/28/15 1813 04/28/15 2158 04/29/15 0756  GLUCAP 50* 57* 151* 342* 146*   Iron Studies: No results for input(s): IRON, TIBC, TRANSFERRIN, FERRITIN in the last 72 hours. @lablastinr3 @ Studies/Results: Nm Gastric Emptying  04/28/2015  CLINICAL DATA:  Recurrent vomiting. EXAM: NUCLEAR MEDICINE GASTRIC EMPTYING SCAN TECHNIQUE: After oral ingestion of radiolabeled meal, sequential abdominal images were obtained for 4 hours. Percentage of activity emptying the stomach was calculated at 1 hour, 2 hour, 3 hour, and 4 hours. RADIOPHARMACEUTICALS:  2.0 mCi Tc-8m MDP labeled sulfur colloid orally COMPARISON:  None. FINDINGS: Expected location of the stomach in the left upper quadrant. Ingested meal empties the stomach gradually over the course of the study. 44% emptied at 1 hr ( normal >= 10%) 45% emptied at 2 hr ( normal >= 40%) 47% emptied at 3 hr ( normal >= 70%) 60% emptied at 4 hr ( normal >= 90%) IMPRESSION: Delayed gastric emptying study. Electronically Signed   By: Marijo Conception, M.D.   On: 04/28/2015 15:23   Medications:   . allopurinol  100 mg Oral Daily  . aspirin  325 mg Oral Daily  . calcium carbonate  800 mg of elemental calcium Oral TID  . cefTAZidime (FORTAZ)  IV  500 mg Intravenous Q24H  . dialysis solution 1.5% low-MG/low-CA   Intraperitoneal 5 X Daily  . dialysis  solution 2.5% low-MG/low-CA   Intraperitoneal 5 X Daily  . heparin  5,000 Units Subcutaneous 3 times per day  . insulin aspart  0-9 Units Subcutaneous TID WC  . insulin aspart  3 Units Subcutaneous TID WC  . insulin glargine  40 Units Subcutaneous QHS  . levothyroxine  112 mcg Oral QAC breakfast  . metoCLOPramide  10 mg Oral TID AC & HS  . midodrine  10 mg Oral TID  . multivitamin  1 tablet Oral QHS  . pantoprazole  40 mg Oral Q1200  . potassium chloride  20 mEq Oral Daily  . sodium chloride  3 mL Intravenous Q12H  . sodium chloride  3 mL Intravenous Q12H  . sodium chloride  3 mL Intravenous Q12H    I have seen and examined this patient and agree with plan as outlined by Juanell Fairly, NP.  Mr. Wootton has delayed gastric emptying and has responded to higher dose of reglan.  For first ray amputation tomorrow per [name].  Continue with current CAPD prescription and agree with increasing calcium carbonate (corrected Ca 8.9). Cornelio Parkerson A,MD 04/29/2015 12:59 PM

## 2015-04-29 NOTE — Consult Note (Signed)
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 XU  Jack Chung is an 73 y.o. male.  HPI: Patient is a 73-year-old gentleman with diabetic insensate neuropathy peripheral vascular disease end stage renal disease on peritoneal dialysis who presents with gangrenous ulceration and cellulitis of the left great toe.  Past Medical History  Diagnosis Date  . Hypertension   . Diabetes mellitus   . Hypothyroidism   . CHF (congestive heart failure) (HCC)   . Anemia   . Blind left eye   . Sleep apnea     not on CPAP  . Coronary artery disease     CABG x 11 Jun 2009.  MRSA infections of incsions  . Arthritis   . Sarcoidosis (HCC)     Hx:of  . Diabetic retinopathy (HCC)     Hx: of bilateral  . Edema   . Hyperlipidemia   . 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 Atherosclerosis of native arteries of the extremities, unspecified   . Eczema     Hx: of  . Insomnia   . Seasonal allergies   . ESRD on dialysis (HCC)     Was on dialysis in July 2011 and then stopped and restarted May 2014. He then transitioned to PD in Aug 2014. He does PD on cycler when at home and CAPD 6 exchanges per day when travelling. His wife is "in charge" and does the exchanges.   . History of blood transfusion     with heart surgery  . Coronary atherosclerosis of native coronary artery   . Atherosclerosis of native arteries of the extremities, unspecified     Past Surgical History  Procedure Laterality Date  . Cardiac surgery      total of 6 surgeries, 5 related to mrsa  . Coronary artery bypass graft  06/2009  . Cataract surgery      Hx: of  . Debridements      Hx: of secondary to MRSA  . Av fistula placement Right 10/11/2012    Procedure: ARTERIOVENOUS (AV) FISTULA CREATION;  Surgeon: Todd F Early, MD;  Location: [facility] OR;  Service: Vascular;  Laterality: Right;  . Insertion of dialysis catheter Left   . Capd insertion N/A 11/28/2012     Procedure: LAPAROSCOPIC PERITONEAL DIALYSIS CATHETER PLACEMENT;  Surgeon: Armando Ramirez, MD;  Location: [facility] OR;  Service: General;  Laterality: N/A;  . Left heart catheterization with coronary/graft angiogram N/A 01/31/2014    Procedure: LEFT HEART CATHETERIZATION WITH CORONARY/GRAFT ANGIOGRAM;  Surgeon: Thomas A Kelly, MD;  Location: [facility] CATH LAB;  Service: Cardiovascular;  Laterality: N/A;  . Peripheral vascular catheterization N/A 04/28/2015    Procedure: Abdominal Aortogram w/Lower Extremity;  Surgeon: Brian L Chen, MD;  Location: [facility] INVASIVE [facility];  Service: Cardiovascular;  Laterality: N/A;    Family History  Problem Relation Age of Onset  . COPD Mother   . COPD Father     Social History:  reports that he has never smoked. He has never used smokeless tobacco. He reports that he does not drink alcohol or use illicit drugs.  Allergies:  Allergies  Allergen Reactions  . Flu Virus Vaccine     Gets the flu, patient has bad reaction to the flu vaccine.  . Lipitor [Atorvastatin]     Leg cramps    Medications: I have reviewed the patient's current medications.  Results for orders placed or performed during the hospital encounter of 04/23/15 (from the   past 48 hour(s))  Glucose, capillary     Status: Abnormal   Collection Time: 04/27/15  8:06 PM  Result Value Ref Range   Glucose-Capillary 351 (H) 65 - 99 mg/dL  Glucose, capillary     Status: Abnormal   Collection Time: 04/28/15 12:59 AM  Result Value Ref Range   Glucose-Capillary 210 (H) 65 - 99 mg/dL  Basic metabolic panel     Status: Abnormal   Collection Time: 04/28/15  4:38 AM  Result Value Ref Range   Sodium 129 (L) 135 - 145 mmol/L   Potassium 2.8 (L) 3.5 - 5.1 mmol/L   Chloride 92 (L) 101 - 111 mmol/L   CO2 26 22 - 32 mmol/L   Glucose, Bld 133 (H) 65 - 99 mg/dL   BUN 31 (H) 6 - 20 mg/dL   Creatinine, Ser 6.21 (H) 0.61 - 1.24 mg/dL   Calcium 7.3 (L) 8.9 - 10.3 mg/dL   GFR calc non Af Amer 8 (L) >60 mL/min   GFR calc  Af Amer 9 (L) >60 mL/min    Comment: (NOTE) The eGFR has been calculated using the CKD EPI equation. This calculation has not been validated in all clinical situations. eGFR's persistently <60 mL/min signify possible Chronic Kidney Disease.    Anion gap 11 5 - 15  Glucose, capillary     Status: None   Collection Time: 04/28/15  7:38 AM  Result Value Ref Range   Glucose-Capillary 65 65 - 99 mg/dL  Glucose, capillary     Status: None   Collection Time: 04/28/15  9:00 AM  Result Value Ref Range   Glucose-Capillary 81 65 - 99 mg/dL  Glucose, capillary     Status: Abnormal   Collection Time: 04/28/15  4:05 PM  Result Value Ref Range   Glucose-Capillary 47 (L) 65 - 99 mg/dL  Glucose, capillary     Status: Abnormal   Collection Time: 04/28/15  4:30 PM  Result Value Ref Range   Glucose-Capillary 45 (L) 65 - 99 mg/dL  Glucose, capillary     Status: Abnormal   Collection Time: 04/28/15  4:44 PM  Result Value Ref Range   Glucose-Capillary 56 (L) 65 - 99 mg/dL  Glucose, capillary     Status: Abnormal   Collection Time: 04/28/15  4:53 PM  Result Value Ref Range   Glucose-Capillary 50 (L) 65 - 99 mg/dL  CBC     Status: Abnormal   Collection Time: 04/28/15  5:08 PM  Result Value Ref Range   WBC 12.7 (H) 4.0 - 10.5 K/uL   RBC 3.59 (L) 4.22 - 5.81 MIL/uL   Hemoglobin 11.4 (L) 13.0 - 17.0 g/dL   HCT 34.4 (L) 39.0 - 52.0 %   MCV 95.8 78.0 - 100.0 fL   MCH 31.8 26.0 - 34.0 pg   MCHC 33.1 30.0 - 36.0 g/dL   RDW 13.8 11.5 - 15.5 %   Platelets 333 150 - 400 K/uL  Creatinine, serum     Status: Abnormal   Collection Time: 04/28/15  5:08 PM  Result Value Ref Range   Creatinine, Ser 6.34 (H) 0.61 - 1.24 mg/dL   GFR calc non Af Amer 8 (L) >60 mL/min   GFR calc Af Amer 9 (L) >60 mL/min    Comment: (NOTE) The eGFR has been calculated using the CKD EPI equation. This calculation has not been validated in all clinical situations. eGFR's persistently <60 mL/min signify possible Chronic  Kidney Disease.   Glucose, capillary       Status: Abnormal   Collection Time: 04/28/15  5:18 PM  Result Value Ref Range   Glucose-Capillary 57 (L) 65 - 99 mg/dL  Glucose, capillary     Status: Abnormal   Collection Time: 04/28/15  6:13 PM  Result Value Ref Range   Glucose-Capillary 151 (H) 65 - 99 mg/dL  Glucose, capillary     Status: Abnormal   Collection Time: 04/28/15  9:58 PM  Result Value Ref Range   Glucose-Capillary 342 (H) 65 - 99 mg/dL  Glucose, capillary     Status: Abnormal   Collection Time: 04/29/15  7:56 AM  Result Value Ref Range   Glucose-Capillary 146 (H) 65 - 99 mg/dL  CBC     Status: Abnormal   Collection Time: 04/29/15 10:54 AM  Result Value Ref Range   WBC 10.1 4.0 - 10.5 K/uL   RBC 3.48 (L) 4.22 - 5.81 MIL/uL   Hemoglobin 11.2 (L) 13.0 - 17.0 g/dL   HCT 33.5 (L) 39.0 - 52.0 %   MCV 96.3 78.0 - 100.0 fL   MCH 32.2 26.0 - 34.0 pg   MCHC 33.4 30.0 - 36.0 g/dL   RDW 14.0 11.5 - 15.5 %   Platelets 352 150 - 400 K/uL  Basic metabolic panel     Status: Abnormal   Collection Time: 04/29/15 10:54 AM  Result Value Ref Range   Sodium 127 (L) 135 - 145 mmol/L   Potassium 2.9 (L) 3.5 - 5.1 mmol/L   Chloride 91 (L) 101 - 111 mmol/L   CO2 26 22 - 32 mmol/L   Glucose, Bld 135 (H) 65 - 99 mg/dL   BUN 30 (H) 6 - 20 mg/dL   Creatinine, Ser 6.13 (H) 0.61 - 1.24 mg/dL   Calcium 7.4 (L) 8.9 - 10.3 mg/dL   GFR calc non Af Amer 8 (L) >60 mL/min   GFR calc Af Amer 10 (L) >60 mL/min    Comment: (NOTE) The eGFR has been calculated using the CKD EPI equation. This calculation has not been validated in all clinical situations. eGFR's persistently <60 mL/min signify possible Chronic Kidney Disease.    Anion gap 10 5 - 15  Glucose, capillary     Status: Abnormal   Collection Time: 04/29/15 11:59 AM  Result Value Ref Range   Glucose-Capillary 139 (H) 65 - 99 mg/dL  Basic metabolic panel     Status: Abnormal   Collection Time: 04/29/15  1:35 PM  Result Value Ref Range    Sodium 126 (L) 135 - 145 mmol/L   Potassium 3.5 3.5 - 5.1 mmol/L   Chloride 90 (L) 101 - 111 mmol/L   CO2 25 22 - 32 mmol/L   Glucose, Bld 203 (H) 65 - 99 mg/dL   BUN 29 (H) 6 - 20 mg/dL   Creatinine, Ser 5.85 (H) 0.61 - 1.24 mg/dL   Calcium 7.7 (L) 8.9 - 10.3 mg/dL   GFR calc non Af Amer 9 (L) >60 mL/min   GFR calc Af Amer 10 (L) >60 mL/min    Comment: (NOTE) The eGFR has been calculated using the CKD EPI equation. This calculation has not been validated in all clinical situations. eGFR's persistently <60 mL/min signify possible Chronic Kidney Disease.    Anion gap 11 5 - 15  Glucose, capillary     Status: Abnormal   Collection Time: 04/29/15  4:50 PM  Result Value Ref Range   Glucose-Capillary 231 (H) 65 - 99 mg/dL    Nm Gastric Emptying  04/28/2015    CLINICAL DATA:  Recurrent vomiting. EXAM: NUCLEAR MEDICINE GASTRIC EMPTYING SCAN TECHNIQUE: After oral ingestion of radiolabeled meal, sequential abdominal images were obtained for 4 hours. Percentage of activity emptying the stomach was calculated at 1 hour, 2 hour, 3 hour, and 4 hours. RADIOPHARMACEUTICALS:  2.0 mCi Tc-99m MDP labeled sulfur colloid orally COMPARISON:  None. FINDINGS: Expected location of the stomach in the left upper quadrant. Ingested meal empties the stomach gradually over the course of the study. 44% emptied at 1 hr ( normal >= 10%) 45% emptied at 2 hr ( normal >= 40%) 47% emptied at 3 hr ( normal >= 70%) 60% emptied at 4 hr ( normal >= 90%) IMPRESSION: Delayed gastric emptying study. Electronically Signed   By: James  Green Jr, M.D.   On: 04/28/2015 15:23    Review of Systems  All other systems reviewed and are negative.  Blood pressure 130/42, pulse 74, temperature 97.9 F (36.6 C), temperature source Oral, resp. rate 18, height 5' 10" (1.778 m), weight 105.9 kg (233 lb 7.5 oz), SpO2 95 %. Physical Exam On examination I cannot palpate a dorsalis pedis pulse. Patient has cellulitis ulceration and necrosis of  the left great toe. In review of patient's vascular workup patient arteriogram shows sufficient circulation to potentially heal an amputation of the great toe. Assessment/Plan: Assessment: Diabetic insensate neuropathy peripheral vascular disease end stage renal disease on peritoneal dialysis with gangrenous ulcer and cellulitis of the left great toe.  Plan: We will plan for amputation of the great toe through the MTP joint tomorrow Wednesday. Discussed risks of the wound healing. Patient states he understands that she wishes to proceed at this time.  Sativa Gelles V 04/29/2015, 5:47 PM      

## 2015-04-30 ENCOUNTER — Inpatient Hospital Stay (HOSPITAL_COMMUNITY): Admission: RE | Admit: 2015-04-30 | Payer: Medicare Other | Source: Ambulatory Visit

## 2015-04-30 ENCOUNTER — Encounter (HOSPITAL_COMMUNITY): Payer: Self-pay | Admitting: Anesthesiology

## 2015-04-30 ENCOUNTER — Inpatient Hospital Stay (HOSPITAL_COMMUNITY): Payer: Medicare Other | Admitting: Anesthesiology

## 2015-04-30 ENCOUNTER — Encounter (HOSPITAL_COMMUNITY)
Admission: EM | Disposition: A | Discharge status: Home Health | Payer: Self-pay | Source: Home / Self Care | Attending: Internal Medicine

## 2015-04-30 HISTORY — PX: AMPUTATION: SHX166

## 2015-04-30 LAB — RENAL FUNCTION PANEL
ANION GAP: 11 (ref 5–15)
Albumin: 1.8 g/dL — ABNORMAL LOW (ref 3.5–5.0)
BUN: 28 mg/dL — ABNORMAL HIGH (ref 6–20)
CHLORIDE: 93 mmol/L — AB (ref 101–111)
CO2: 25 mmol/L (ref 22–32)
Calcium: 7.3 mg/dL — ABNORMAL LOW (ref 8.9–10.3)
Creatinine, Ser: 6.02 mg/dL — ABNORMAL HIGH (ref 0.61–1.24)
GFR calc Af Amer: 10 mL/min — ABNORMAL LOW (ref >60)
GFR calc non Af Amer: 8 mL/min — ABNORMAL LOW (ref >60)
GLUCOSE: 160 mg/dL — AB (ref 65–99)
POTASSIUM: 3.3 mmol/L — AB (ref 3.5–5.1)
Phosphorus: 4.1 mg/dL (ref 2.5–4.6)
SODIUM: 129 mmol/L — AB (ref 135–145)

## 2015-04-30 LAB — GLUCOSE, CAPILLARY
GLUCOSE-CAPILLARY: 97 mg/dL (ref 65–99)
GLUCOSE-CAPILLARY: 70 mg/dL (ref 65–99)
GLUCOSE-CAPILLARY: 216 mg/dL — AB (ref 65–99)
Glucose-Capillary: 71 mg/dL (ref 65–99)
Glucose-Capillary: 168 mg/dL — ABNORMAL HIGH (ref 65–99)

## 2015-04-30 LAB — SURGICAL PCR SCREEN
MRSA, PCR: NEGATIVE
Staphylococcus aureus: NEGATIVE

## 2015-04-30 LAB — CBC
HEMATOCRIT: 29.8 % — AB (ref 39.0–52.0)
HEMOGLOBIN: 10.1 g/dL — AB (ref 13.0–17.0)
MCH: 32.7 pg (ref 26.0–34.0)
MCHC: 33.9 g/dL (ref 30.0–36.0)
MCV: 96.4 fL (ref 78.0–100.0)
Platelets: 313 10*3/uL (ref 150–400)
RBC: 3.09 MIL/uL — ABNORMAL LOW (ref 4.22–5.81)
RDW: 14.2 % (ref 11.5–15.5)
WBC: 8.6 10*3/uL (ref 4.0–10.5)

## 2015-04-30 SURGERY — AMPUTATION, FOOT, RAY
Anesthesia: Monitor Anesthesia Care | Site: Foot | Laterality: Left

## 2015-04-30 MED ORDER — FENTANYL CITRATE (PF) 100 MCG/2ML IJ SOLN
INTRAMUSCULAR | Status: AC
  Administered 2015-04-30: 50 ug
  Filled 2015-04-30: qty 2

## 2015-04-30 MED ORDER — FENTANYL CITRATE (PF) 100 MCG/2ML IJ SOLN: 25.0000 ug | INTRAMUSCULAR | Status: DC | PRN

## 2015-04-30 MED ORDER — BUPIVACAINE-EPINEPHRINE (PF) 0.5% -1:200000 IJ SOLN
INTRAMUSCULAR | Status: DC | PRN
  Administered 2015-04-30: 20 mL via PERINEURAL

## 2015-04-30 MED ORDER — FENTANYL CITRATE (PF) 250 MCG/5ML IJ SOLN
INTRAMUSCULAR | Status: AC
  Filled 2015-04-30: qty 5

## 2015-04-30 MED ORDER — ACETAMINOPHEN 650 MG RE SUPP: 650.0000 mg | Freq: Four times a day (QID) | RECTAL | Status: DC | PRN

## 2015-04-30 MED ORDER — ACETAMINOPHEN 325 MG PO TABS: 650.0000 mg | ORAL_TABLET | Freq: Four times a day (QID) | ORAL | Status: DC | PRN

## 2015-04-30 MED ORDER — POTASSIUM CHLORIDE CRYS ER 20 MEQ PO TBCR
30.0000 meq | EXTENDED_RELEASE_TABLET | Freq: Two times a day (BID) | ORAL | Status: DC
  Administered 2015-04-30 – 2015-05-02 (×5): 30 meq via ORAL
  Filled 2015-04-30: qty 2
  Filled 2015-04-30: qty 1
  Filled 2015-04-30 (×4): qty 2

## 2015-04-30 MED ORDER — METHOCARBAMOL 500 MG PO TABS: 500.0000 mg | ORAL_TABLET | Freq: Four times a day (QID) | ORAL | Status: DC | PRN

## 2015-04-30 MED ORDER — PROPOFOL 10 MG/ML IV BOLUS
INTRAVENOUS | Status: AC
  Filled 2015-04-30: qty 20

## 2015-04-30 MED ORDER — METHOCARBAMOL 1000 MG/10ML IJ SOLN
500.0000 mg | Freq: Four times a day (QID) | INTRAVENOUS | Status: DC | PRN
  Filled 2015-04-30: qty 5

## 2015-04-30 MED ORDER — HYDROMORPHONE HCL 1 MG/ML IJ SOLN
1.0000 mg | INTRAMUSCULAR | Status: DC | PRN
  Administered 2015-05-02: 1 mg via INTRAVENOUS
  Filled 2015-04-30 (×2): qty 1

## 2015-04-30 MED ORDER — 0.9 % SODIUM CHLORIDE (POUR BTL) OPTIME
TOPICAL | Status: DC | PRN
  Administered 2015-04-30: 1000 mL

## 2015-04-30 MED ORDER — ONDANSETRON HCL 4 MG PO TABS: 4.0000 mg | ORAL_TABLET | Freq: Four times a day (QID) | ORAL | Status: DC | PRN

## 2015-04-30 MED ORDER — METOCLOPRAMIDE HCL 5 MG PO TABS: 5.0000 mg | ORAL_TABLET | Freq: Three times a day (TID) | ORAL | Status: DC | PRN

## 2015-04-30 MED ORDER — SODIUM CHLORIDE 0.9 % IV SOLN
INTRAVENOUS | Status: DC
  Administered 2015-04-30: 12:00:00 via INTRAVENOUS

## 2015-04-30 MED ORDER — METOCLOPRAMIDE HCL 5 MG/ML IJ SOLN: 5.0000 mg | Freq: Three times a day (TID) | INTRAMUSCULAR | Status: DC | PRN

## 2015-04-30 MED ORDER — ONDANSETRON HCL 4 MG/2ML IJ SOLN: 4.0000 mg | Freq: Four times a day (QID) | INTRAMUSCULAR | Status: DC | PRN

## 2015-04-30 MED ORDER — LIDOCAINE-EPINEPHRINE (PF) 1.5 %-1:200000 IJ SOLN
INTRAMUSCULAR | Status: DC | PRN
  Administered 2015-04-30: 10 mL via PERINEURAL

## 2015-04-30 MED ORDER — SODIUM CHLORIDE 0.9 % IV SOLN
INTRAVENOUS | Status: DC
  Administered 2015-04-30: 09:00:00 via INTRAVENOUS

## 2015-04-30 SURGICAL SUPPLY — 36 items
BANDAGE ELASTIC 6 VELCRO ST LF (GAUZE/BANDAGES/DRESSINGS) ×3 IMPLANT
BLADE SAW SGTL MED 73X18.5 STR (BLADE) IMPLANT
BNDG COHESIVE 4X5 TAN STRL (GAUZE/BANDAGES/DRESSINGS) ×3 IMPLANT
BNDG GAUZE ELAST 4 BULKY (GAUZE/BANDAGES/DRESSINGS) ×3 IMPLANT
COVER SURGICAL LIGHT HANDLE (MISCELLANEOUS) ×6 IMPLANT
DRAPE U-SHAPE 47X51 STRL (DRAPES) ×6 IMPLANT
DRSG ADAPTIC 3X8 NADH LF (GAUZE/BANDAGES/DRESSINGS) ×3 IMPLANT
DRSG PAD ABDOMINAL 8X10 ST (GAUZE/BANDAGES/DRESSINGS) ×6 IMPLANT
DURAPREP 26ML APPLICATOR (WOUND CARE) ×3 IMPLANT
ELECT REM PT RETURN 9FT ADLT (ELECTROSURGICAL) ×3
ELECTRODE REM PT RTRN 9FT ADLT (ELECTROSURGICAL) ×1 IMPLANT
GAUZE SPONGE 4X4 12PLY STRL (GAUZE/BANDAGES/DRESSINGS) ×3 IMPLANT
GLOVE BIOGEL PI IND STRL 7.0 (GLOVE) ×2 IMPLANT
GLOVE BIOGEL PI IND STRL 9 (GLOVE) ×1 IMPLANT
GLOVE BIOGEL PI INDICATOR 7.0 (GLOVE) ×4
GLOVE BIOGEL PI INDICATOR 9 (GLOVE) ×2
GLOVE SURG ORTHO 9.0 STRL STRW (GLOVE) ×3 IMPLANT
GLOVE SURG SS PI 7.0 STRL IVOR (GLOVE) ×6 IMPLANT
GOWN STRL REUS W/ TWL LRG LVL3 (GOWN DISPOSABLE) ×1 IMPLANT
GOWN STRL REUS W/ TWL XL LVL3 (GOWN DISPOSABLE) ×2 IMPLANT
GOWN STRL REUS W/TWL LRG LVL3 (GOWN DISPOSABLE) ×2
GOWN STRL REUS W/TWL XL LVL3 (GOWN DISPOSABLE) ×4
KIT BASIN OR (CUSTOM PROCEDURE TRAY) ×3 IMPLANT
KIT ROOM TURNOVER OR (KITS) ×3 IMPLANT
NS IRRIG 1000ML POUR BTL (IV SOLUTION) ×3 IMPLANT
PACK ORTHO EXTREMITY (CUSTOM PROCEDURE TRAY) ×3 IMPLANT
PAD ABD 8X10 STRL (GAUZE/BANDAGES/DRESSINGS) ×3 IMPLANT
PAD ARMBOARD 7.5X6 YLW CONV (MISCELLANEOUS) ×6 IMPLANT
SPONGE GAUZE 4X4 12PLY STER LF (GAUZE/BANDAGES/DRESSINGS) ×3 IMPLANT
SPONGE LAP 18X18 X RAY DECT (DISPOSABLE) ×3 IMPLANT
STOCKINETTE IMPERVIOUS LG (DRAPES) IMPLANT
SUT ETHILON 2 0 PSLX (SUTURE) ×6 IMPLANT
TOWEL OR 17X24 6PK STRL BLUE (TOWEL DISPOSABLE) ×3 IMPLANT
TOWEL OR 17X26 10 PK STRL BLUE (TOWEL DISPOSABLE) ×3 IMPLANT
UNDERPAD 30X30 INCONTINENT (UNDERPADS AND DIAPERS) ×3 IMPLANT
WATER STERILE IRR 1000ML POUR (IV SOLUTION) ×3 IMPLANT

## 2015-04-30 NOTE — Progress Notes (Signed)
Orthopedic Tech Progress Note Patient Details:  Jack Chung 1941-01-17 [id number]  Ortho Devices Type of Ortho Device: Postop shoe/boot Ortho Device/Splint Interventions: Application   [place] Pink 04/30/2015, 1:56 PM

## 2015-04-30 NOTE — Interval H&P Note (Signed)
History and Physical Interval Note:  04/30/2015 6:28 AM  Jack Chung  has presented today for surgery, with the diagnosis of Gangrene Left Great Toe  The various methods of treatment have been discussed with the patient and family. After consideration of risks, benefits and other options for treatment, the patient has consented to  Procedure(s): 1st Ray Amputation Left Foot (Left) as a surgical intervention .  The patient's history has been reviewed, patient examined, no change in status, stable for surgery.  I have reviewed the patient's chart and labs.  Questions were answered to the patient's satisfaction.     Yehia Mcbain V

## 2015-04-30 NOTE — Anesthesia Preprocedure Evaluation (Signed)
Anesthesia Evaluation  Patient identified by MRN, date of birth, ID band Patient awake    Reviewed: Allergy & Precautions, NPO status , Patient's Chart, lab work & pertinent test results  History of Anesthesia Complications Negative for: history of anesthetic complications  Airway Mallampati: II  TM Distance: >3 FB Neck ROM: Full    Dental  (+) Edentulous Upper, Edentulous Lower   Pulmonary sleep apnea and Continuous Positive Airway Pressure Ventilation ,    breath sounds clear to auscultation       Cardiovascular hypertension, Pt. on medications + CAD, + CABG, + Peripheral Vascular Disease and +CHF   Rhythm:Regular     Neuro/Psych negative neurological ROS  negative psych ROS   GI/Hepatic GERD  Controlled and Medicated,  Endo/Other  diabetes, Type 2, Insulin DependentHypothyroidism   Renal/GU ESRF and DialysisRenal disease     Musculoskeletal   Abdominal   Peds  Hematology  (+) anemia ,   Anesthesia Other Findings   Reproductive/Obstetrics                             Anesthesia Physical Anesthesia Plan  ASA: III  Anesthesia Plan: MAC and Regional   Post-op Pain Management:    Induction: Intravenous  Airway Management Planned: Simple Face Mask, Natural Airway and Nasal Cannula  Additional Equipment: None  Intra-op Plan:   Post-operative Plan:   Informed Consent: I have reviewed the patients History and Physical, chart, labs and discussed the procedure including the risks, benefits and alternatives for the proposed anesthesia with the patient or authorized representative who has indicated his/her understanding and acceptance.     Plan Discussed with: CRNA and Surgeon  Anesthesia Plan Comments:         Anesthesia Quick Evaluation

## 2015-04-30 NOTE — Transfer of Care (Signed)
Immediate Anesthesia Transfer of Care Note  Patient: Jack Chung  Procedure(s) Performed: Procedure(s): 1st Ray Amputation Left Foot (Left)  Patient Location: PACU  Anesthesia Type:General  Level of Consciousness: awake, alert  and oriented  Airway & Oxygen Therapy: Patient Spontanous Breathing and Patient connected to nasal cannula oxygen  Post-op Assessment: Report given to RN and Post -op Vital signs reviewed and stable  Post vital signs: Reviewed and stable  Last Vitals:  Filed Vitals:   04/30/15 0928 04/30/15 0936  BP: 114/52 120/41  Pulse: 82   Temp:    Resp: 24 14    Complications: No apparent anesthesia complications

## 2015-04-30 NOTE — H&P (View-Only) (Signed)
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 Ailene Ards is an 74 y.o. male.  HPI: Patient is a 74 year old gentleman with diabetic insensate neuropathy peripheral vascular disease end stage renal disease on peritoneal dialysis who presents with gangrenous ulceration and cellulitis of the left great toe.  Past Medical History  Diagnosis Date  . Hypertension   . Diabetes mellitus   . Hypothyroidism   . CHF (congestive heart failure) ([place])   . Anemia   . Blind left eye   . Sleep apnea     not on CPAP  . Coronary artery disease     CABG x 11 Jun 2009.  MRSA infections of incsions  . Arthritis   . Sarcoidosis ([place])     Hx:of  . Diabetic retinopathy ([place])     Hx: of bilateral  . Edema   . Hyperlipidemia   . 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 Atherosclerosis of native arteries of the extremities, unspecified   . Eczema     Hx: of  . Insomnia   . Seasonal allergies   . ESRD on dialysis [facility])     Was on dialysis in July 2011 and then stopped and restarted May 2014. He then transitioned to PD in Aug 2014. He does PD on cycler when at home and CAPD 6 exchanges per day when travelling. His wife is "in charge" and does the exchanges.   . History of blood transfusion     with heart surgery  . Coronary atherosclerosis of native coronary artery   . Atherosclerosis of native arteries of the extremities, unspecified     Past Surgical History  Procedure Laterality Date  . Cardiac surgery      total of 6 surgeries, 5 related to mrsa  . Coronary artery bypass graft  06/2009  . Cataract surgery      Hx: of  . Debridements      Hx: of secondary to MRSA  . Av fistula placement Right 10/11/2012    Procedure: ARTERIOVENOUS (AV) FISTULA CREATION;  Surgeon: Rosetta Posner, MD;  Location: [place];  Service: Vascular;  Laterality: Right;  . Insertion of dialysis catheter Left   . Capd insertion N/A 11/28/2012     Procedure: LAPAROSCOPIC PERITONEAL DIALYSIS CATHETER PLACEMENT;  Surgeon: Ralene Ok, MD;  Location: [place];  Service: General;  Laterality: N/A;  . Left heart catheterization with coronary/graft angiogram N/A 01/31/2014    Procedure: LEFT HEART CATHETERIZATION WITH Beatrix Fetters;  Surgeon: Troy Sine, MD;  Location: [facility] CATH LAB;  Service: Cardiovascular;  Laterality: N/A;  . Peripheral vascular catheterization N/A 04/28/2015    Procedure: Abdominal Aortogram w/Lower Extremity;  Surgeon: Conrad [place], MD;  Location: [place] CV LAB;  Service: Cardiovascular;  Laterality: N/A;    Family History  Problem Relation Age of Onset  . COPD Mother   . COPD Father     Social History:  reports that he has never smoked. He has never used smokeless tobacco. He reports that he does not drink alcohol or use illicit drugs.  Allergies:  Allergies  Allergen Reactions  . Flu Virus Vaccine     Gets the flu, patient has bad reaction to the flu vaccine.  . Lipitor [Atorvastatin]     Leg cramps    Medications: I have reviewed the patient's current medications.  Results for orders placed or performed during the hospital encounter of 04/23/15 (from the  past 48 hour(s))  Glucose, capillary     Status: Abnormal   Collection Time: 04/27/15  8:06 PM  Result Value Ref Range   Glucose-Capillary 351 (H) 65 - 99 mg/dL  Glucose, capillary     Status: Abnormal   Collection Time: 04/28/15 12:59 AM  Result Value Ref Range   Glucose-Capillary 210 (H) 65 - 99 mg/dL  Basic metabolic panel     Status: Abnormal   Collection Time: 04/28/15  4:38 AM  Result Value Ref Range   Sodium 129 (L) 135 - 145 mmol/L   Potassium 2.8 (L) 3.5 - 5.1 mmol/L   Chloride 92 (L) 101 - 111 mmol/L   CO2 26 22 - 32 mmol/L   Glucose, Bld 133 (H) 65 - 99 mg/dL   BUN 31 (H) 6 - 20 mg/dL   Creatinine, Ser 6.21 (H) 0.61 - 1.24 mg/dL   Calcium 7.3 (L) 8.9 - 10.3 mg/dL   GFR calc non Af Amer 8 (L) >60 mL/min   GFR calc  Af Amer 9 (L) >60 mL/min    Comment: (NOTE) The eGFR has been calculated using the CKD EPI equation. This calculation has not been validated in all clinical situations. eGFR's persistently <60 mL/min signify possible Chronic Kidney Disease.    Anion gap 11 5 - 15  Glucose, capillary     Status: None   Collection Time: 04/28/15  7:38 AM  Result Value Ref Range   Glucose-Capillary 65 65 - 99 mg/dL  Glucose, capillary     Status: None   Collection Time: 04/28/15  9:00 AM  Result Value Ref Range   Glucose-Capillary 81 65 - 99 mg/dL  Glucose, capillary     Status: Abnormal   Collection Time: 04/28/15  4:05 PM  Result Value Ref Range   Glucose-Capillary 47 (L) 65 - 99 mg/dL  Glucose, capillary     Status: Abnormal   Collection Time: 04/28/15  4:30 PM  Result Value Ref Range   Glucose-Capillary 45 (L) 65 - 99 mg/dL  Glucose, capillary     Status: Abnormal   Collection Time: 04/28/15  4:44 PM  Result Value Ref Range   Glucose-Capillary 56 (L) 65 - 99 mg/dL  Glucose, capillary     Status: Abnormal   Collection Time: 04/28/15  4:53 PM  Result Value Ref Range   Glucose-Capillary 50 (L) 65 - 99 mg/dL  CBC     Status: Abnormal   Collection Time: 04/28/15  5:08 PM  Result Value Ref Range   WBC 12.7 (H) 4.0 - 10.5 K/uL   RBC 3.59 (L) 4.22 - 5.81 MIL/uL   Hemoglobin 11.4 (L) 13.0 - 17.0 g/dL   HCT 34.4 (L) 39.0 - 52.0 %   MCV 95.8 78.0 - 100.0 fL   MCH 31.8 26.0 - 34.0 pg   MCHC 33.1 30.0 - 36.0 g/dL   RDW 13.8 11.5 - 15.5 %   Platelets 333 150 - 400 K/uL  Creatinine, serum     Status: Abnormal   Collection Time: 04/28/15  5:08 PM  Result Value Ref Range   Creatinine, Ser 6.34 (H) 0.61 - 1.24 mg/dL   GFR calc non Af Amer 8 (L) >60 mL/min   GFR calc Af Amer 9 (L) >60 mL/min    Comment: (NOTE) The eGFR has been calculated using the CKD EPI equation. This calculation has not been validated in all clinical situations. eGFR's persistently <60 mL/min signify possible Chronic  Kidney Disease.   Glucose, capillary  Status: Abnormal   Collection Time: 04/28/15  5:18 PM  Result Value Ref Range   Glucose-Capillary 57 (L) 65 - 99 mg/dL  Glucose, capillary     Status: Abnormal   Collection Time: 04/28/15  6:13 PM  Result Value Ref Range   Glucose-Capillary 151 (H) 65 - 99 mg/dL  Glucose, capillary     Status: Abnormal   Collection Time: 04/28/15  9:58 PM  Result Value Ref Range   Glucose-Capillary 342 (H) 65 - 99 mg/dL  Glucose, capillary     Status: Abnormal   Collection Time: 04/29/15  7:56 AM  Result Value Ref Range   Glucose-Capillary 146 (H) 65 - 99 mg/dL  CBC     Status: Abnormal   Collection Time: 04/29/15 10:54 AM  Result Value Ref Range   WBC 10.1 4.0 - 10.5 K/uL   RBC 3.48 (L) 4.22 - 5.81 MIL/uL   Hemoglobin 11.2 (L) 13.0 - 17.0 g/dL   HCT 33.5 (L) 39.0 - 52.0 %   MCV 96.3 78.0 - 100.0 fL   MCH 32.2 26.0 - 34.0 pg   MCHC 33.4 30.0 - 36.0 g/dL   RDW 14.0 11.5 - 15.5 %   Platelets 352 150 - 400 K/uL  Basic metabolic panel     Status: Abnormal   Collection Time: 04/29/15 10:54 AM  Result Value Ref Range   Sodium 127 (L) 135 - 145 mmol/L   Potassium 2.9 (L) 3.5 - 5.1 mmol/L   Chloride 91 (L) 101 - 111 mmol/L   CO2 26 22 - 32 mmol/L   Glucose, Bld 135 (H) 65 - 99 mg/dL   BUN 30 (H) 6 - 20 mg/dL   Creatinine, Ser 6.13 (H) 0.61 - 1.24 mg/dL   Calcium 7.4 (L) 8.9 - 10.3 mg/dL   GFR calc non Af Amer 8 (L) >60 mL/min   GFR calc Af Amer 10 (L) >60 mL/min    Comment: (NOTE) The eGFR has been calculated using the CKD EPI equation. This calculation has not been validated in all clinical situations. eGFR's persistently <60 mL/min signify possible Chronic Kidney Disease.    Anion gap 10 5 - 15  Glucose, capillary     Status: Abnormal   Collection Time: 04/29/15 11:59 AM  Result Value Ref Range   Glucose-Capillary 139 (H) 65 - 99 mg/dL  Basic metabolic panel     Status: Abnormal   Collection Time: 04/29/15  1:35 PM  Result Value Ref Range    Sodium 126 (L) 135 - 145 mmol/L   Potassium 3.5 3.5 - 5.1 mmol/L   Chloride 90 (L) 101 - 111 mmol/L   CO2 25 22 - 32 mmol/L   Glucose, Bld 203 (H) 65 - 99 mg/dL   BUN 29 (H) 6 - 20 mg/dL   Creatinine, Ser 5.85 (H) 0.61 - 1.24 mg/dL   Calcium 7.7 (L) 8.9 - 10.3 mg/dL   GFR calc non Af Amer 9 (L) >60 mL/min   GFR calc Af Amer 10 (L) >60 mL/min    Comment: (NOTE) The eGFR has been calculated using the CKD EPI equation. This calculation has not been validated in all clinical situations. eGFR's persistently <60 mL/min signify possible Chronic Kidney Disease.    Anion gap 11 5 - 15  Glucose, capillary     Status: Abnormal   Collection Time: 04/29/15  4:50 PM  Result Value Ref Range   Glucose-Capillary 231 (H) 65 - 99 mg/dL    Nm Gastric Emptying  04/28/2015  CLINICAL DATA:  Recurrent vomiting. EXAM: NUCLEAR MEDICINE GASTRIC EMPTYING SCAN TECHNIQUE: After oral ingestion of radiolabeled meal, sequential abdominal images were obtained for 4 hours. Percentage of activity emptying the stomach was calculated at 1 hour, 2 hour, 3 hour, and 4 hours. RADIOPHARMACEUTICALS:  2.0 mCi Tc-11mMDP labeled sulfur colloid orally COMPARISON:  None. FINDINGS: Expected location of the stomach in the left upper quadrant. Ingested meal empties the stomach gradually over the course of the study. 44% emptied at 1 hr ( normal >= 10%) 45% emptied at 2 hr ( normal >= 40%) 47% emptied at 3 hr ( normal >= 70%) 60% emptied at 4 hr ( normal >= 90%) IMPRESSION: Delayed gastric emptying study. Electronically Signed   By: JMarijo Conception M.D.   On: 04/28/2015 15:23    Review of Systems  All other systems reviewed and are negative.  Blood pressure 130/42, pulse 74, temperature 97.9 F (36.6 C), temperature source Oral, resp. rate 18, height _0  (1.778 m), weight 105.9 kg (233 lb 7.5 oz), SpO2 95 %. Physical Exam On examination I cannot palpate a dorsalis pedis pulse. Patient has cellulitis ulceration and necrosis of  the left great toe. In review of patient's vascular workup patient arteriogram shows sufficient circulation to potentially heal an amputation of the great toe. Assessment/Plan: Assessment: Diabetic insensate neuropathy peripheral vascular disease end stage renal disease on peritoneal dialysis with gangrenous ulcer and cellulitis of the left great toe.  Plan: We will plan for amputation of the great toe through the MTP joint tomorrow Wednesday. Discussed risks of the wound healing. Patient states he understands that she wishes to proceed at this time.  Samaria Anes V 04/29/2015, 5:47 PM

## 2015-04-30 NOTE — Anesthesia Postprocedure Evaluation (Signed)
Anesthesia Post Note  Patient: Jack Chung  Procedure(s) Performed: Procedure(s) (LRB): 1st Ray Amputation Left Foot (Left)  Patient location during evaluation: PACU Anesthesia Type: Regional and MAC Level of consciousness: awake Pain management: pain level controlled Vital Signs Assessment: post-procedure vital signs reviewed and stable Respiratory status: spontaneous breathing and respiratory function stable Cardiovascular status: stable Postop Assessment: No signs of nausea or vomiting Anesthetic complications: no    Last Vitals:  Filed Vitals:   04/30/15 1039 04/30/15 1120  BP:  118/56  Pulse:  84  Temp: 36.7 C 36.5 C  Resp:  18    Last Pain:  Filed Vitals:   04/30/15 1121  PainSc: 0-No pain                 Aemon Koeller

## 2015-04-30 NOTE — Care Management Note (Signed)
Case Management Note  Patient Details  Name: Jack Chung MRN: [id number] Date of Birth: [birth year]  Subjective/Objective:           CM following for progression and d/c planning.         Action/Plan: 04/30/2015 Amp of great toe, await PT eval and recommendations for [facility] and DME. Will continue to follow and setup as needed.  Expected Discharge Date:                  Expected Discharge Plan:  [place]  In-House Referral:  NA  Discharge planning Services  CM Consult  Post Acute Care Choice:    Choice offered to:     DME Arranged:    DME Agency:     HH Arranged:    HH Agency:     Status of Service:  In process, will continue to follow  Medicare Important Message Given:  Yes Date Medicare IM Given:    Medicare IM give by:    Date Additional Medicare IM Given:    Additional Medicare Important Message give by:     If discussed at [place] of Stay Meetings, dates discussed:    Additional Comments:  Adron Bene, RN 04/30/2015, 12:14 PM

## 2015-04-30 NOTE — Progress Notes (Signed)
ANTIBIOTIC CONSULT NOTE - FOLLOW UP  Pharmacy Consult for Vancomycin Indication: gangrene L great toe  Allergies  Allergen Reactions  . Flu Virus Vaccine     Gets the flu, patient has bad reaction to the flu vaccine.  . Lipitor [Atorvastatin]     Leg cramps    Patient Measurements: Height: 5\' 10"  (177.8 cm) Weight: 234 lb 2.1 oz (106.2 kg) IBW/kg (Calculated) : 73  Vital Signs: Temp: 97.7 F (36.5 C) (11/23 1120) Temp Source: Oral (11/23 0556) BP: 118/56 mmHg (11/23 1120) Pulse Rate: 84 (11/23 1120) Intake/Output from previous day: 11/22 0701 - 11/23 0700 In: [id number] [P.O.:720; IV Piggyback:100] Out: 16000  Intake/Output from this shift: Total I/O In: 5 [I.V.:5] Out: 0   Labs:  Recent Labs  04/29/15 1054  04/29/15 1837 04/29/15 2205 04/30/15 0548  WBC 10.1  --   --  10.8* 8.6  HGB 11.2*  --   --  11.8* 10.1*  PLT 352  --   --  374 313  CREATININE 6.13*  < > 5.92* 6.01* 6.02*  < > = values in this interval not displayed. Estimated Creatinine Clearance: 13.7 mL/min (by C-G formula based on Cr of 6.02). No results for input(s): VANCOTROUGH, VANCOPEAK, VANCORANDOM, GENTTROUGH, GENTPEAK, GENTRANDOM, TOBRATROUGH, TOBRAPEAK, TOBRARND, AMIKACINPEAK, AMIKACINTROU, AMIKACIN in the last 72 hours.   Microbiology: Recent Results (from the past 720 hour(s))  Culture, blood (routine x 2)     Status: None   Collection Time: 04/23/15  3:39 PM  Result Value Ref Range Status   Specimen Description BLOOD LEFT ARM  Final   Special Requests IN PEDIATRIC BOTTLE 3CC  Final   Culture NO GROWTH 5 DAYS  Final   Report Status 04/28/2015 FINAL  Final  Culture, blood (routine x 2)     Status: None   Collection Time: 04/23/15  9:30 PM  Result Value Ref Range Status   Specimen Description BLOOD LEFT HAND  Final   Special Requests BOTTLES DRAWN AEROBIC ONLY 5CC  Final   Culture NO GROWTH 5 DAYS  Final   Report Status 04/28/2015 FINAL  Final  Culture, body fluid-bottle     Status: None    Collection Time: 04/23/15 11:45 PM  Result Value Ref Range Status   Specimen Description FLUID PERITONEAL  Final   Special Requests BOTTLES DRAWN AEROBIC AND ANAEROBIC 10CC  Final   Culture NO GROWTH 5 DAYS  Final   Report Status 04/29/2015 FINAL  Final  Gram stain     Status: None   Collection Time: 04/23/15 11:45 PM  Result Value Ref Range Status   Specimen Description FLUID PERITONEAL  Final   Special Requests NONE  Final   Gram Stain NO WBC SEEN NO ORGANISMS SEEN   Final   Report Status 04/24/2015 FINAL  Final  Surgical pcr screen     Status: None   Collection Time: 04/30/15  6:51 AM  Result Value Ref Range Status   MRSA, PCR NEGATIVE NEGATIVE Final   Staphylococcus aureus NEGATIVE NEGATIVE Final    Comment:        The Xpert SA Assay (FDA approved for NASAL specimens in patients over 43 years of age), is one component of a comprehensive surveillance program.  Test performance has been validated by [facility] for patients greater than or equal to 10 year old. It is not intended to diagnose infection nor to guide or monitor treatment.     Anti-infectives    Start     Dose/Rate  Route Frequency Ordered Stop   04/30/15 0600  ceFAZolin (ANCEF) IVPB 2 g/50 mL premix     2 g 100 mL/hr over 30 Minutes Intravenous To Surgery 04/29/15 2100 04/30/15 0955   04/27/15 1345  vancomycin (VANCOCIN) 1,250 mg in sodium chloride 0.9 % 250 mL IVPB     1,250 mg 166.7 mL/hr over 90 Minutes Intravenous  Once 04/27/15 1339 04/27/15 1905   04/25/15 1800  cefTAZidime (FORTAZ) 500 mg in dextrose 5 % 50 mL IVPB     500 mg 100 mL/hr over 30 Minutes Intravenous Every 24 hours 04/25/15 1408     04/24/15 1800  cefTAZidime (FORTAZ) 1 g in dextrose 5 % 50 mL IVPB  Status:  Discontinued     1 g 100 mL/hr over 30 Minutes Intravenous Every 24 hours 04/24/15 1150 04/25/15 1408   04/23/15 1815  vancomycin (VANCOCIN) 2,000 mg in sodium chloride 0.9 % 500 mL IVPB     2,000 mg 250 mL/hr over 120  Minutes Intravenous  Once 04/23/15 1813 04/23/15 2226   04/23/15 1815  cefTAZidime (FORTAZ) 1 g in dextrose 5 % 50 mL IVPB     1 g 100 mL/hr over 30 Minutes Intravenous  Once 04/23/15 1813 04/23/15 1907   04/23/15 1745  vancomycin (VANCOCIN) IVPB 1000 mg/200 mL premix  Status:  Discontinued     1,000 mg 200 mL/hr over 60 Minutes Intravenous  Once 04/23/15 1740 04/23/15 1813      Assessment and Plan: 74 yo Chung on antibiotic day #8 for L great toe infection.  Pt is s/p amputation this morning.  Can likely discontinue IV antibiotic therapy 24-48 hours after surgery.    Pt is ESRD that dialyzes using PD.  We have been dosing his Vancomycin based on levels.  Last dose 11/21.  Will check level with AM labs 11/24 and likely redose x 1.  This should be sufficient post-op antibiotic coverage.   [employer], Pharm.D., BCPS Clinical Pharmacist Pager [phone] 04/30/2015 1:28 PM

## 2015-04-30 NOTE — Progress Notes (Signed)
[facility] PROGRESS NOTE  Jack Chung [id number] DOB: [birth year] DOA: 04/23/2015 PCP: Jack Neer, MD  Brief summary 74 y.o. 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 Per [name], he has been unable to tolerate food, for the past month. He states that whenever he eats or drinks he will throw it up a few hours later. He does complain of some discomfort in the LUQ prior to emesis but denies any abdominal pain. [facility] foot great toe discoloration/drainage for the last 1 month. Patient has not been able to keep any of his antibiotics down. He was started on clindamycin and ciprofloxacin but was unable to keep his pills down. Patient admitted and found to have gastroparesis and will require amputation of his left great toe. No further nausea or vomiting and surgery planned for 11/23 by Jack Chung.  Subjective: - no complaints today, seen post op - denies pain, no chest pain / abdominal discomfort - nausea improved.   Assessment/Plan: Intractable nausea vomiting - Likely an exacerbation of gastroparesis vs under-dialysis vs infection (left great toe gangrene) - Per renal rec's gastric emptying study checked and has confirmed gastroparesis  - Will continue increased reglan QID 10 mg and PPI  - Minimize use of narcotics - Esophagogram also performed during this admission and demonstrated no strictures, but positive delay transit  Gangrene of the left great toe - blood culture 2, lactate mildly elevated on admission - x-rays of the left toe w/o signs of osteomyelitis - vascular surgery evaluated patient for safer level of amputation, appreciate consult. ABI with  Jack Chung and aortogram done (demonstrating severe calcifications and through collaterals perfusion only up to his Jack Chung area) - patient is s/p great toe amputation trough Jack Chung joint on 11/23 - Will continue current antibiotics for now, narrow on d/c and do a short course - Will follow outcome and further rec's  History of orthostatic hypotension - continue midodrine  Uncontrolled diabetes  - with A1C 8.7 - Will continue SSI and lantus  Hypokalemia corrected by nephrology with PD/supplementation  - continue to monitor  Chronic CHF (congestive heart failure) (Jack Chung) - patient without any signs and symptoms of fluid overload of exacerbation, most recent 0000000, diastolic in nature - will follow low sodium diet - daily weights - Strict intake and output  Hx of Sarcoidosis (Jack Chung) - previously on prednisone; stable and denying any complaints currently   Sleep apnea - Not on CPAP - Will monitor for need of O2 supplementation  ESRD on dialysis (Jack Chung) on peritoneal dialysis - Renal service following for continuation of PD and further rec's on electrolytes repletion  Obesity Body mass index is 34.98 kg/(m^2). - low calorie diet and increase physical activity discussed with patient   Gout - continue allopurinol  CAD (coronary artery disease) - multivessel coronary artery disease, medical therapy recommended, followed by Jack Chung; status post 3 vessel CABG in 2011 - no CP and no SOB - troponin with flat elevation at 0.09 and no suggesting ACS; most likely due to chronic renal failure - no abnormalities on tele or EKG suggesting ischemia  Pressure ulcer  - just redness for now - will continue barrier cream, constant reposition and overlay mattress  Code Status: Full Family Communication: wife at bedside  Disposition Plan: home 1-2 days per ortho  Consultants:  Renal service  Orthopedic service   Vascular surgery  Procedures:  See below for x-ray reports   Gastric  empty study: positive for gastroparesis  ABI: ABIs indicate probable calcificied vessels. Right great toe pressure is within normal limits. Unable to get left great toe pressure secondary to ulceration, however used 2nd toe. 2nd toe pressure is abnormal.   Aortogram with leg run off: Arteriogram showing multilevel arterial calcifications.There is peroneal runoff bilaterally with acceptable collateral flow off of the peroneal on the left.   Antibiotics:  vanc and fortaz 11/16 >>  Objective: Filed Vitals:   04/30/15 1039 04/30/15 1120  BP:  118/56  Pulse:  84  Temp: 98.1 F (36.7 C) 97.7 F (36.5 C)  Resp:  18    Intake/Output Summary (Last 24 hours) at 04/30/15 1136 Last data filed at 04/30/15 1015  Gross per 24 hour  Intake   9225 ml  Output  13200 ml  Net  -3975 ml   Filed Weights   04/28/15 0500 04/28/15 2029 04/29/15 2040  Weight: 104.6 kg (230 lb 9.6 oz) 105.9 kg (233 lb 7.5 oz) 106.2 kg (234 lb 2.1 oz)   Exam:  General:  NAD.   Cardiovascular: S1 and S2, no rubs or gallops  Respiratory: good air movement, no wheezing  Abdomen: soft, NT, ND, positive BS  Musculoskeletal: no swelling, LLE with ACE wrapped post op  Data Reviewed: Basic Metabolic Panel:  Recent Labs Lab 04/23/15 1810  04/26/15 0429  04/29/15 1054 04/29/15 1335 04/29/15 1837 04/29/15 2205 04/30/15 0548  NA  --   < > 129*  < > 127* 126* 126* 127* 129*  K  --   < > 3.0*  < > 2.9* 3.5 3.9 3.2* 3.3*  CL  --   < > 91*  < > 91* 90* 93* 91* 93*  CO2  --   < > 24  < > 26 25 20* 27 25  GLUCOSE  --   < > 134*  < > 135* 203* 209* 180* 160*  BUN  --   < > 33*  < > 30* 29* 30* 28* 28*  CREATININE  --   < > 7.11*  < > 6.13* 5.85* 5.92* 6.01* 6.02*  CALCIUM  --   < > 7.4*  < > 7.4* 7.7* 7.4* 7.7* 7.3*  MG 1.8  --   --   --   --   --   --   --   --   PHOS  --   --  4.5  --   --   --   --   --  4.1  < > = values in this interval not displayed. Liver Function Tests:  Recent Labs Lab  04/23/15 1446 04/24/15 0550 04/26/15 0429 04/29/15 2205 04/30/15 0548  AST 36 32  --  53*  --   ALT 26 21  --  33  --   ALKPHOS 128* 98  --  135*  --   BILITOT 0.5 0.5  --  0.7  --   PROT 7.7 6.3*  --  7.1  --   ALBUMIN 2.7* 2.2* 2.1* 2.2* 1.8*    Recent Labs Lab 04/23/15 1446  LIPASE 29   CBC:  Recent Labs Lab 04/26/15 0429 04/28/15 1708 04/29/15 1054 04/29/15 2205 04/30/15 0548  WBC 11.1* 12.7* 10.1 10.8* 8.6  NEUTROABS  --   --   --  7.4  --   HGB 11.3* 11.4* 11.2* 11.8* 10.1*  HCT 34.6* 34.4* 33.5* 35.9* 29.8*  MCV 96.4 95.8 96.3 96.8 96.4  PLT 363 333 352 374 313   Cardiac Enzymes:  Recent Labs Lab 04/23/15 2130 04/23/15 2330 04/24/15 0550  TROPONINI 0.07* 0.06* 0.09*   CBG:  Recent Labs Lab 04/29/15 1650 04/29/15 2039 04/30/15 0823 04/30/15 1015 04/30/15 1119  GLUCAP 231* 169* 97 70 71    Recent Results (from the past 240 hour(s))  Culture, blood (routine x 2)     Status: None   Collection Time: 04/23/15  3:39 PM  Result Value Ref Range Status   Specimen Description BLOOD LEFT ARM  Final   Special Requests IN PEDIATRIC BOTTLE 3CC  Final   Culture NO GROWTH 5 DAYS  Final   Report Status 04/28/2015 FINAL  Final  Culture, blood (routine x 2)     Status: None   Collection Time: 04/23/15  9:30 PM  Result Value Ref Range Status   Specimen Description BLOOD LEFT HAND  Final   Special Requests BOTTLES DRAWN AEROBIC ONLY 5CC  Final   Culture NO GROWTH 5 DAYS  Final   Report Status 04/28/2015 FINAL  Final  Culture, body fluid-bottle     Status: None   Collection Time: 04/23/15 11:45 PM  Result Value Ref Range Status   Specimen Description FLUID PERITONEAL  Final   Special Requests BOTTLES DRAWN AEROBIC AND ANAEROBIC 10CC  Final   Culture NO GROWTH 5 DAYS  Final   Report Status 04/29/2015 FINAL  Final  Gram stain     Status: None   Collection Time: 04/23/15 11:45 PM  Result Value Ref Range Status   Specimen Description FLUID PERITONEAL   Final   Special Requests NONE  Final   Gram Stain NO WBC SEEN NO ORGANISMS SEEN   Final   Report Status 04/24/2015 FINAL  Final  Surgical pcr screen     Status: None   Collection Time: 04/30/15  6:51 AM  Result Value Ref Range Status   MRSA, PCR NEGATIVE NEGATIVE Final   Staphylococcus aureus NEGATIVE NEGATIVE Final    Comment:        The Xpert SA Assay (FDA approved for NASAL specimens in patients over 76 years of age), is one component of a comprehensive surveillance program.  Test performance has been validated by [facility] for patients greater than or equal to 91 year old. It is not intended to diagnose infection nor to guide or monitor treatment.      Studies: Nm Gastric Emptying  04/28/2015  CLINICAL DATA:  Recurrent vomiting. EXAM: NUCLEAR MEDICINE GASTRIC EMPTYING SCAN TECHNIQUE: After oral ingestion of radiolabeled meal, sequential abdominal images were obtained for 4 hours. Percentage of activity emptying the stomach was calculated at 1 hour, 2 hour, 3 hour, and 4 hours. RADIOPHARMACEUTICALS:  2.0 mCi Tc-59m MDP labeled sulfur colloid orally COMPARISON:  None. FINDINGS: Expected location of the stomach in the left upper quadrant. Ingested meal empties the stomach gradually over the course of the study. 44% emptied at 1 hr ( normal >= 10%) 45% emptied at 2 hr ( normal >= 40%) 47% emptied at 3 hr ( normal >= 70%) 60% emptied at 4 hr ( normal >= 90%) IMPRESSION: Delayed gastric emptying study. Electronically Signed   By: Marijo Conception, M.D.   On: 04/28/2015 15:23    Scheduled Meds: . allopurinol  100 mg Oral Daily  . aspirin  325 mg  Oral Daily  . calcium carbonate  800 mg of elemental calcium Oral TID WC  . cefTAZidime (FORTAZ)  IV  500 mg Intravenous Q24H  . dialysis solution 1.5% low-MG/low-CA   Intraperitoneal 5 X Daily  . dialysis solution 2.5% low-MG/low-CA   Intraperitoneal 5 X Daily  . heparin  5,000 Units Subcutaneous 3 times per day  . insulin aspart  0-9  Units Subcutaneous TID WC  . insulin aspart  3 Units Subcutaneous TID WC  . insulin glargine  40 Units Subcutaneous QHS  . levothyroxine  112 mcg Oral QAC breakfast  . metoCLOPramide  10 mg Oral TID AC & HS  . midodrine  10 mg Oral TID  . multivitamin  1 tablet Oral QHS  . pantoprazole  40 mg Oral Q1200  . potassium chloride  30 mEq Oral BID  . sodium chloride  3 mL Intravenous Q12H  . sodium chloride  3 mL Intravenous Q12H  . sodium chloride  3 mL Intravenous Q12H   Continuous Infusions: . sodium chloride 10 mL/hr at 04/30/15 0908  . sodium chloride      Principal Problem:   Intractable nausea and vomiting Active Problems:   CHF (congestive heart failure) (HCC)   Sarcoidosis (HCC)   Sleep apnea   ESRD on dialysis ([place])   Gout   CAD (coronary artery disease)   Orthostatic hypotension   [place]etta Board  [facility] Pager [phone]. If 7PM-7AM, please contact night-coverage at [email or web address], password [facility] 04/30/2015, 11:36 AM  LOS: 7 days

## 2015-04-30 NOTE — Progress Notes (Signed)
Jack Chung Progress Note  Assessment/Plan: 1. Nausea/ vomiting - x 2 weeks. Hx of similar problems earlier this year. 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 We are giving him 18L per day here- he is an average to slow transporter so is on the appropriate modality with CAPD (tested last in 2014). Eating solids last night and today without problems. Swallowing study neg- did have brief retention of tablet in distal esophagus but then passed into stomach. GES 11/21 shows delayed gastric emptying.  Problem appears to be resolved. 2. ESRD f/u Dr Joelyn Oms, cont PD - continue current CAPD, Na declining - watch; Daily renal profile ordered. PD fld clear, no fibrin. No redness reported at PD exit site. 3. DM 32 yrs Per primary  4. 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 On TUMS, phos is OK at 4.1 6. CAD hx CABG 2011 7. Left great toe ischemia/gangrene - per primary / ortho. On IV Fortaz and Vanc. S/p first ray amputation today and doing well. 8. Anemia - Hgb 10.1 Will start ESA if HGB drops further post op.   9. Nutrition - alb low 2.1 - need ^ protein diet, prostat/nepro- Carb mod/renal diet. NPO at present for surgery 10.Hypokalemia: K+ 3.3  Today after one run of KCL and increased dose of KDUR to 20 MEQ BID. Will increase again to Kdur 30 meq and follow K+.    [name] NP-C 04/30/2015, 9:34 AM   Kidney Chung [id number]  Subjective: "I'm waiting on them to come get me". No C/O pain. Patient expresses gratitude for our care and support.     Objective Filed Vitals:   04/29/15 1651 04/29/15 2040 04/30/15 0556 04/30/15 0928  BP: 130/42 120/60 91/54 114/52  Pulse: 74 73 79 82  Temp: 97.9 F (36.6 C) 98.8 F (37.1 C) 98.5 F (36.9 C)   TempSrc: Oral Oral Oral   Resp: 18 18 17 24   Height:      Weight:  106.2 kg (234 lb 2.1 oz)    SpO2: 95% 96% 99% 100%   Physical  Exam General: chronically ill appearing male, looks older than stated age, NAD.  Heart: S1, S2. RRR. No M/R/G Lungs: Bilateral breaths sounds CTA/AP Abdomen: Soft nontender. PD catheter LUQ, Drsg CDI.  Extremities: No LE edema. Has infected L great toe, erythema present, no drainage, open to air.   Dialysis Access: PD cath Drsg CDI. RUA AVF + Bruit  Dialysis Orders: CAPD with 5 exchanges per day at 8am, 11am, 2pm, 5pm and overnight dwell at 8pm - followed by Dr. Pearson Grippe who recently increased dwell volume from 2.5L to 3L but pt has yet to receive those fluids. Uses 1.5% then 2.5% x3 and last exchange with 1.5%. EDW is 105kg but currently he's down to 100.5kg because of poor intake. Additional Objective Labs: Basic Metabolic Panel:  Recent Labs Lab 04/26/15 0429  04/29/15 1837 04/29/15 2205 04/30/15 0548  NA 129*  < > 126* 127* 129*  K 3.0*  < > 3.9 3.2* 3.3*  CL 91*  < > 93* 91* 93*  CO2 24  < > 20* 27 25  GLUCOSE 134*  < > 209* 180* 160*  BUN 33*  < > 30* 28* 28*  CREATININE 7.11*  < > 5.92* 6.01* 6.02*  CALCIUM 7.4*  < > 7.4* 7.7* 7.3*  PHOS 4.5  --   --   --  4.1  < > = values in this interval not displayed. Liver Function Tests:  Recent Labs Lab 04/23/15 1446 04/24/15 0550 04/26/15 0429 04/29/15 2205 04/30/15 0548  AST 36 32  --  53*  --   ALT 26 21  --  33  --   ALKPHOS 128* 98  --  135*  --   BILITOT 0.5 0.5  --  0.7  --   PROT 7.7 6.3*  --  7.1  --   ALBUMIN 2.7* 2.2* 2.1* 2.2* 1.8*    Recent Labs Lab 04/23/15 1446  LIPASE 29   CBC:  Recent Labs Lab 04/26/15 0429 04/28/15 1708 04/29/15 1054 04/29/15 2205 04/30/15 0548  WBC 11.1* 12.7* 10.1 10.8* 8.6  NEUTROABS  --   --   --  7.4  --   HGB 11.3* 11.4* 11.2* 11.8* 10.1*  HCT 34.6* 34.4* 33.5* 35.9* 29.8*  MCV 96.4 95.8 96.3 96.8 96.4  PLT 363 333 352 374 313   Blood Culture    Component Value Date/Time   SDES FLUID PERITONEAL 04/23/2015 2345   SDES FLUID PERITONEAL 04/23/2015 2345    SPECREQUEST BOTTLES DRAWN AEROBIC AND ANAEROBIC 10CC 04/23/2015 2345   SPECREQUEST NONE 04/23/2015 2345   CULT NO GROWTH 5 DAYS 04/23/2015 2345   REPTSTATUS 04/29/2015 FINAL 04/23/2015 2345   REPTSTATUS 04/24/2015 FINAL 04/23/2015 2345    Cardiac Enzymes:  Recent Labs Lab 04/23/15 2130 04/23/15 2330 04/24/15 0550  TROPONINI 0.07* 0.06* 0.09*   CBG:  Recent Labs Lab 04/29/15 0756 04/29/15 1159 04/29/15 1650 04/29/15 2039 04/30/15 0823  GLUCAP 146* 139* 231* 169* 97   Iron Studies: No results for input(s): IRON, TIBC, TRANSFERRIN, FERRITIN in the last 72 hours. @lablastinr3 @ Studies/Results: Nm Gastric Emptying  04/28/2015  CLINICAL DATA:  Recurrent vomiting. EXAM: NUCLEAR MEDICINE GASTRIC EMPTYING SCAN TECHNIQUE: After oral ingestion of radiolabeled meal, sequential abdominal images were obtained for 4 hours. Percentage of activity emptying the stomach was calculated at 1 hour, 2 hour, 3 hour, and 4 hours. RADIOPHARMACEUTICALS:  2.0 mCi Tc-22m MDP labeled sulfur colloid orally COMPARISON:  None. FINDINGS: Expected location of the stomach in the left upper quadrant. Ingested meal empties the stomach gradually over the course of the study. 44% emptied at 1 hr ( normal >= 10%) 45% emptied at 2 hr ( normal >= 40%) 47% emptied at 3 hr ( normal >= 70%) 60% emptied at 4 hr ( normal >= 90%) IMPRESSION: Delayed gastric emptying study. Electronically Signed   By: Marijo Conception, M.D.   On: 04/28/2015 15:23   Medications: . sodium chloride Stopped (04/29/15 2148)  . sodium chloride 10 mL/hr at 04/30/15 0908   . [MAR Hold] allopurinol  100 mg Oral Daily  . [MAR Hold] aspirin  325 mg Oral Daily  . [MAR Hold] calcium carbonate  800 mg of elemental calcium Oral TID WC  .  ceFAZolin (ANCEF) IV  2 g Intravenous To OR  . [MAR Hold] cefTAZidime (FORTAZ)  IV  500 mg Intravenous Q24H  . [MAR Hold] dialysis solution 1.5% low-MG/low-CA   Intraperitoneal 5 X Daily  . [MAR Hold] dialysis solution  2.5% low-MG/low-CA   Intraperitoneal 5 X Daily  . [MAR Hold] heparin  5,000 Units Subcutaneous 3 times per day  . [MAR Hold] insulin aspart  0-9 Units Subcutaneous TID WC  . [MAR Hold] insulin aspart  3 Units Subcutaneous TID WC  . [MAR Hold] insulin glargine  40 Units Subcutaneous QHS  . [MAR Hold] levothyroxine  112 mcg Oral QAC breakfast  . [MAR Hold] metoCLOPramide  10 mg Oral TID AC & HS  . [MAR Hold] midodrine  10 mg Oral TID  . [MAR Hold] multivitamin  1 tablet Oral QHS  . [MAR Hold] pantoprazole  40 mg Oral Q1200  . [MAR Hold] potassium chloride  20 mEq Oral BID  . [MAR Hold] sodium chloride  3 mL Intravenous Q12H  . [MAR Hold] sodium chloride  3 mL Intravenous Q12H  . [MAR Hold] sodium chloride  3 mL Intravenous Q12H    I have seen and examined this patient and agree with plan as outlined by Juanell Fairly, NP.  He is s/p first ray amputation and doing well, hopeful discharge to home tomorrow.  Continue with CAPD. Kaleeah Gingerich A,MD 04/30/2015 12:42 PM

## 2015-04-30 NOTE — Op Note (Signed)
04/23/2015 - 04/30/2015  9:59 AM  PATIENT:  Jack Chung    PRE-OPERATIVE DIAGNOSIS:  Gangrene Left Great Toe  POST-OPERATIVE DIAGNOSIS:  Same  PROCEDURE:  1st Ray Amputation Left Foot  SURGEON:  Newt Minion, MD  PHYSICIAN ASSISTANT:None ANESTHESIA:   General  PREOPERATIVE INDICATIONS:  Jack Chung is a  74 y.o. male with a diagnosis of Gangrene Left Great Toe who failed conservative measures and elected for surgical management.    The risks benefits and alternatives were discussed with the patient preoperatively including but not limited to the risks of infection, bleeding, nerve injury, cardiopulmonary complications, the need for revision surgery, among others, and the patient was willing to proceed.  OPERATIVE IMPLANTS: none  OPERATIVE FINDINGS: Gangrene left great toe with tophaceous gouty deposits along the extensor tendons  OPERATIVE PROCEDURE: Patient was brought the operating room after undergoing a popliteal block. After adequate levels of anesthesia were obtained patient's left lower extremity was prepped using DuraPrep draped into a sterile field. A timeout was called. A racquet incision was made around the first ray to resect the first metatarsal and great toe 11 block of tissue. The necrotic gangrenous great toe was resected in 1 block of tissue with the first ray. The wound was irrigated with normal saline. Electrocautery was used for hemostasis. The incision was closed using 2-0 nylon. A sterile compressive dressing was applied patient was taken to the PACU in stable condition.

## 2015-04-30 NOTE — Anesthesia Procedure Notes (Addendum)
Anesthesia Regional Block:  Popliteal block  Pre-Anesthetic Checklist: ,, timeout performed, Correct Patient, Correct Site, Correct Laterality, Correct Procedure, Correct Position, site marked, Risks and benefits discussed, Surgical consent,  Pre-op evaluation,  At surgeon's request  Laterality: Lower and Left  Prep: chloraprep       Needles:  Injection technique: Single-shot  Needle Type: Echogenic Stimulator Needle          Additional Needles:  Procedures: ultrasound guided (picture in chart) and nerve stimulator Popliteal block  Nerve Stimulator or Paresthesia:  Response: plantar, 0.5 mA,   Additional Responses:   Narrative:  Injection made incrementally with aspirations every 5 mL.  Performed by: Personally  Anesthesiologist: Dre Gamino  Additional Notes: H+P and labs reviewed, risks and benefits discussed with patient, procedure tolerated well without complications

## 2015-04-30 NOTE — Evaluation (Signed)
Physical Therapy Evaluation Patient Details Name: Jack Chung MRN: [id number] DOB: 1940/11/15 Today's Date: 04/30/2015   History of Present Illness  74 y.o. male admitted with intractable vomiting/nausea and gangrene of Lt great toe, s/p 1st Ray Amputation Left Foot  Clinical Impression  Pt admitted with above diagnosis, seen following the above procedure. Pt currently with functional limitations due to the deficits listed below (see PT Problem List). Able to stand pivot transfer with close guard assist and cues to maintain NWB stats on LLE with post-op shoe in place for safety. He has a motor scoot at home which will need to be his primary means for transportation until allowed to bear weight through LLE. He does not have the physical capacity at this point to ambulate with a NWB LE despite UE use on a rolling walker. Reviewed importance of LLE elevation and exercises. Pt will benefit from skilled PT to increase their independence and safety with mobility to allow discharge to the venue listed below.       Follow Up Recommendations Home health PT    Equipment Recommendations  3in1 (PT)    Recommendations for Other Services OT consult     Precautions / Restrictions Precautions Precautions: Fall Required Braces or Orthoses: Other Brace/Splint (post-op shoe) Other Brace/Splint: post-op shoe Lt Restrictions Weight Bearing Restrictions: Yes LLE Weight Bearing: Non weight bearing      Mobility  Bed Mobility Overal bed mobility: Modified Independent             General bed mobility comments: extra time  Transfers Overall transfer level: Needs assistance Equipment used: Rolling walker (2 wheeled) Transfers: Sit to/from [facility] Sit to Stand: Min guard Stand pivot transfers: Min guard       General transfer comment: close guard for safety. Impulsive to stand despite cues to sit. Max VC for NWB through LLE, corrected when RW in place and able to bear  weight through UEs appropriately. Cues for precautions and sequencing with pivot transfer to recliner. Small hops with pivot but keeping weight off of LLE.  Ambulation/Gait                Stairs            Wheelchair Mobility    Modified Rankin (Stroke Patients Only)       Balance Overall balance assessment: Needs assistance Sitting-balance support: No upper extremity supported;Feet supported Sitting balance-Leahy Scale: Good     Standing balance support: Single extremity supported Standing balance-Leahy Scale: Poor                               Pertinent Vitals/Pain Pain Assessment: No/denies pain    Home Living Family/patient expects to be discharged to:: Private residence Living Arrangements: Spouse/significant other Available Help at Discharge: Family;Available 24 hours/day Type of Home: Apartment Home Access: Ramped entrance     Home Layout: One level Home Equipment: Walker - 2 wheels;Shower seat;Electric scooter;Cane - single point      Prior Function Level of Independence: Independent with assistive device(s)         Comments: Cane for ambulating     Hand Dominance   Dominant Hand:  (both)    Extremity/Trunk Assessment   Upper Extremity Assessment: Defer to OT evaluation           Lower Extremity Assessment: LLE deficits/detail   LLE Deficits / Details: Lt foot bandaged. Reports numbness  Communication   Communication: No difficulties  Cognition Arousal/Alertness: Awake/alert Behavior During Therapy: Impulsive Overall Cognitive Status: Impaired/Different from baseline Area of Impairment: Safety/judgement     Memory: Decreased recall of precautions   Safety/Judgement: Decreased awareness of safety          General Comments General comments (skin integrity, edema, etc.): Instructions for safety and to take time with tasks. Reviewed positioning (LLE elevation) and exercises to promote optimal healing.     Exercises General Exercises - Lower Extremity Ankle Circles/Pumps: AROM;Both;10 reps;Seated (very limited Lt ankle ROM) Quad Sets: Strengthening;Both;10 reps;Seated Gluteal Sets: Strengthening;Both;10 reps;Seated Long Arc Quad: Strengthening;Both;10 reps;Seated Hip ABduction/ADduction: Strengthening;Both;10 reps;Seated Hip Flexion/Marching: Strengthening;Both;10 reps;Seated      Assessment/Plan    PT Assessment Patient needs continued PT services  PT Diagnosis Difficulty walking;Generalized weakness   PT Problem List Decreased strength;Decreased range of motion;Decreased activity tolerance;Decreased balance;Decreased mobility;Decreased knowledge of use of DME;Impaired sensation;Obesity  PT Treatment Interventions DME instruction;Functional mobility training;Therapeutic activities;Therapeutic exercise;Balance training;Neuromuscular re-education;Patient/family education;Modalities   PT Goals (Current goals can be found in the Care Plan section) Acute Rehab PT Goals Patient Stated Goal: Go home soon PT Goal Formulation: With patient Time For Goal Achievement: 05/14/15 Potential to Achieve Goals: Good    Frequency Min 3X/week   Barriers to discharge Decreased caregiver support lives with wife but she is physically limited    Co-evaluation               End of Session Equipment Utilized During Treatment: Gait belt Activity Tolerance: Patient limited by fatigue Patient left: in chair;with call bell/phone within reach;with family/visitor present Nurse Communication: Mobility status;Weight bearing status         Time: 1354-1418 PT Time Calculation (min) (ACUTE ONLY): 24 min   Charges:   PT Evaluation $Initial PT Evaluation Tier I: 1 Procedure PT Treatments $Therapeutic Exercise: 8-22 mins   PT G CodesEllouise Chung 04/30/2015, 3:05 PM  Jack Chung, Jack Chung

## 2015-05-01 LAB — RENAL FUNCTION PANEL
ALBUMIN: 1.9 g/dL — AB (ref 3.5–5.0)
ANION GAP: 11 (ref 5–15)
BUN: 30 mg/dL — ABNORMAL HIGH (ref 6–20)
CHLORIDE: 92 mmol/L — AB (ref 101–111)
CO2: 25 mmol/L (ref 22–32)
Calcium: 7.5 mg/dL — ABNORMAL LOW (ref 8.9–10.3)
Creatinine, Ser: 5.99 mg/dL — ABNORMAL HIGH (ref 0.61–1.24)
GFR calc Af Amer: 10 mL/min — ABNORMAL LOW (ref >60)
GFR calc non Af Amer: 8 mL/min — ABNORMAL LOW (ref >60)
GLUCOSE: 238 mg/dL — AB (ref 65–99)
PHOSPHORUS: 4 mg/dL (ref 2.5–4.6)
POTASSIUM: 3.9 mmol/L (ref 3.5–5.1)
Sodium: 128 mmol/L — ABNORMAL LOW (ref 135–145)

## 2015-05-01 LAB — GLUCOSE, CAPILLARY
GLUCOSE-CAPILLARY: 144 mg/dL — AB (ref 65–99)
GLUCOSE-CAPILLARY: 219 mg/dL — AB (ref 65–99)
GLUCOSE-CAPILLARY: 289 mg/dL — AB (ref 65–99)
Glucose-Capillary: 165 mg/dL — ABNORMAL HIGH (ref 65–99)

## 2015-05-01 LAB — VANCOMYCIN, RANDOM: Vancomycin Rm: 22 ug/mL

## 2015-05-01 NOTE — Progress Notes (Signed)
[facility] Progress Note  Assessment/Plan: 1. Nausea/ vomiting - x 2 weeks. Hx of similar problems earlier this year. 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 We are giving him 18L per day here- he is an average to slow transporter so is on the appropriate modality with CAPD (tested last in 2014). Eating solids last night and today without problems. Swallowing study neg- did have brief retention of tablet in distal esophagus but then passed into stomach. GES 11/21 shows delayed gastric emptying.Sx resolved.  2. ESRD f/u Dr Joelyn Oms, cont PD - continue current CAPD, Na still low - watch; Daily renal profile ordered. PD fld clear, no fibrin. No redness reported at PD exit site.  3. DM 30 yrs Per primary  4. 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 CAD hx CABG 2011 7. Left great toe ischemia/gangrene -. On IV Fortaz and Vanc. S/p first ray amputation 11/23 [name]; possible d/c Friday; he is supposed to be non weight bearing 8. Anemia - Hgb 10.1 11/23  Will start ESA if HGB drops further post op.  9. Nutrition - alb low 1.9 - need ^ protein diet, prostat/nepro- Carb mod/renal diet. 10.    Hypokalemia up to 3.9 with K suppl Kdur ^ to 30 bid; diet not being restricted  [name], PA-C [place] [phone] 05/01/2015,8:15 AM  LOS: 8 days   Subjective:     Objective Filed Vitals:   04/30/15 1637 04/30/15 2100 05/01/15 0456 05/01/15 0745  BP: 118/52 117/47 126/53 139/58  Pulse: 71 77 80 86  Temp: 97.7 F (36.5 C) 97.8 F (36.6 C) 99.5 F (37.5 C) 99 F (37.2 C)  TempSrc: Oral Oral Oral Oral  Resp: 18 17 17 17   Height:      Weight:      SpO2: 99% 98% 97% 97%   Physical Exam error message on bed weights General: NAD Heart: RRR Lungs: no rales, poor expansion Abdomen: soft NT Extremities: No  LE edema left foot wrapped Dialysis Access:  PD cath  Dialysis Orders: CAPD with 5 exchanges per day at 8am, 11am, 2pm, 5pm and overnight dwell at 8pm - followed by Dr. Pearson Grippe who recently increased dwell volume from 2.5L to 3L but pt has yet to receive those fluids. Uses 1.5% then 2.5% x3 and last exchange with 1.5%. EDW is 105kg but currently   Additional Objective Labs: Basic Metabolic Panel:  Recent Labs Lab 04/26/15 0429  04/29/15 2205 04/30/15 0548 05/01/15 0335  NA 129*  < > 127* 129* 128*  K 3.0*  < > 3.2* 3.3* 3.9  CL 91*  < > 91* 93* 92*  CO2 24  < > 27 25 25   GLUCOSE 134*  < > 180* 160* 238*  BUN 33*  < > 28* 28* 30*  CREATININE 7.11*  < > 6.01* 6.02* 5.99*  CALCIUM 7.4*  < > 7.7* 7.3* 7.5*  PHOS 4.5  --   --  4.1 4.0  < > = values in this interval not displayed. Liver Function Tests:  Recent Labs Lab 04/29/15 2205 04/30/15 0548 05/01/15 0335  AST 53*  --   --   ALT 33  --   --   ALKPHOS 135*  --   --   BILITOT 0.7  --   --  PROT 7.1  --   --   ALBUMIN 2.2* 1.8* 1.9*  CBC:  Recent Labs Lab 04/26/15 0429 04/28/15 1708 04/29/15 1054 04/29/15 2205 04/30/15 0548  WBC 11.1* 12.7* 10.1 10.8* 8.6  NEUTROABS  --   --   --  7.4  --   HGB 11.3* 11.4* 11.2* 11.8* 10.1*  HCT 34.6* 34.4* 33.5* 35.9* 29.8*  MCV 96.4 95.8 96.3 96.8 96.4  PLT 363 333 352 374 313  CBG:  Recent Labs Lab 04/30/15 1015 04/30/15 1119 04/30/15 1636 04/30/15 2102 05/01/15 0719  GLUCAP 70 71 216* 168* 165*  Medications: . sodium chloride 10 mL/hr at 04/30/15 0908  . sodium chloride 10 mL/hr at 04/30/15 1138   . allopurinol  100 mg Oral Daily  . aspirin  325 mg Oral Daily  . calcium carbonate  800 mg of elemental calcium Oral TID WC  . cefTAZidime (FORTAZ)  IV  500 mg Intravenous Q24H  . dialysis solution 1.5% low-MG/low-CA   Intraperitoneal 5 X Daily  . dialysis solution 2.5% low-MG/low-CA   Intraperitoneal 5 X Daily  . heparin  5,000 Units Subcutaneous 3 times per  day  . insulin aspart  0-9 Units Subcutaneous TID WC  . insulin aspart  3 Units Subcutaneous TID WC  . insulin glargine  40 Units Subcutaneous QHS  . levothyroxine  112 mcg Oral QAC breakfast  . metoCLOPramide  10 mg Oral TID AC & HS  . midodrine  10 mg Oral TID  . multivitamin  1 tablet Oral QHS  . pantoprazole  40 mg Oral Q1200  . potassium chloride  30 mEq Oral BID  . sodium chloride  3 mL Intravenous Q12H  . sodium chloride  3 mL Intravenous Q12H  . sodium chloride  3 mL Intravenous Q12H      I have seen and examined this patient and agree with plan as outlined by M. Reinaldo Meeker, PA-C.  Continue with CAPD and disposition per primary service. Richele Strand A,MD 05/01/2015 10:13 AM

## 2015-05-01 NOTE — Progress Notes (Signed)
[facility] PROGRESS NOTE  COBRA DEPASS [id number] DOB: [birth year] DOA: 04/23/2015 PCP: [name], MD  Brief summary 74 y.o. 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 Per Mr. Pierman, he has been unable to tolerate food, for the past month. He states that whenever he eats or drinks he will throw it up a few hours later. He does complain of some discomfort in the LUQ prior to emesis but denies any abdominal pain. [facility] foot great toe discoloration/drainage for the last 1 month. Patient has not been able to keep any of his antibiotics down. He was started on clindamycin and ciprofloxacin but was unable to keep his pills down. Patient admitted and found to have gastroparesis and will require amputation of his left great toe. No further nausea or vomiting and surgery planned for 11/23 by [name].  Subjective: - Endorses throbbing at the surgical site, denies any chest pain, denies any palpitations, has no abdominal complaints, no nausea vomiting or diarrhea  Assessment/Plan:  Intractable nausea vomiting - Likely an exacerbation of gastroparesis vs under-dialysis vs infection (left great toe gangrene) - Per renal rec's gastric emptying study checked and has confirmed gastroparesis  - Will continue increased reglan QID 10 mg and PPI  - Minimize use of narcotics - Esophagogram also performed during this admission and demonstrated no strictures, but positive delay transit - This is resolved, he is able to eat well  Gangrene of the left great toe - blood culture 2, lactate mildly elevated on admission - x-rays of the left toe w/o signs of osteomyelitis - vascular  surgery evaluated patient for safer level of amputation, appreciate consult. ABI with TBI and aortogram done (demonstrating severe calcifications and through collaterals perfusion only up to his Metatarsal area) - patient is s/p great toe amputation trough MTP joint on 11/23 - Will continue current antibiotics for now, narrow on d/c and do a short course - He is status post surgery yesterday, appreciate orthopedics input, plan for discharge home tomorrow with home health PT  History of orthostatic hypotension - continue midodrine  Uncontrolled diabetes  - with A1C 8.7 - Will continue SSI and lantus  Hypokalemia corrected by nephrology with PD/supplementation  - continue to monitor  Chronic CHF (congestive heart failure) ([place]) - patient without any signs and symptoms of fluid overload of exacerbation, most recent 0000000, diastolic in nature - will follow low sodium diet - daily weights - Strict intake and output  Hx of Sarcoidosis ([place]) - previously on prednisone; stable and denying any complaints currently   Sleep apnea - Not on CPAP - Will monitor for need of O2 supplementation  ESRD on dialysis ([place]) on peritoneal dialysis - Renal service following for continuation of PD and further rec's on electrolytes repletion  Obesity Body mass index is 34.98 kg/(m^2). - low calorie diet and increase physical activity discussed with patient   Gout - continue allopurinol  CAD (coronary artery disease) - multivessel coronary artery disease, medical therapy recommended, followed by [name]; status post 3 vessel CABG in 2011 - no CP and no SOB - troponin with flat elevation at 0.09 and no suggesting ACS; most likely due to chronic renal failure - no abnormalities on  tele or EKG suggesting ischemia  Pressure ulcer  - just redness for now - will continue barrier cream, constant reposition and overlay mattress    Code Status: Full Family Communication: wife at bedside    Disposition Plan: home 1-2 days per ortho  Consultants:  Renal service  Orthopedic service   Vascular surgery  Procedures:  See below for x-ray reports   Gastric empty study: positive for gastroparesis  ABI: ABIs indicate probable calcificied vessels. Right great toe pressure is within normal limits. Unable to get left great toe pressure secondary to ulceration, however used 2nd toe. 2nd toe pressure is abnormal.   Aortogram with leg run off: Arteriogram showing multilevel arterial calcifications.There is peroneal runoff bilaterally with acceptable collateral flow off of the peroneal on the left.   Antibiotics:  vanc and fortaz 11/16 >>  Objective: Filed Vitals:   05/01/15 0456 05/01/15 0745  BP: 126/53 139/58  Pulse: 80 86  Temp: 99.5 F (37.5 C) 99 F (37.2 C)  Resp: 17 17    Intake/Output Summary (Last 24 hours) at 05/01/15 1319 Last data filed at 05/01/15 1112  Gross per 24 hour  Intake 12793.67 ml  Output  13150 ml  Net -356.33 ml   Filed Weights   04/28/15 0500 04/28/15 2029 04/29/15 2040  Weight: 104.6 kg (230 lb 9.6 oz) 105.9 kg (233 lb 7.5 oz) 106.2 kg (234 lb 2.1 oz)   Exam:  General:  NAD.   Cardiovascular: S1 and S2, no rubs or gallops  Respiratory: good air movement, no wheezing  Abdomen: soft, NT, ND, positive BS  Musculoskeletal: no swelling, LLE with ACE wrapped post op  Data Reviewed: Basic Metabolic Panel:  Recent Labs Lab 04/26/15 0429  04/29/15 1335 04/29/15 1837 04/29/15 2205 04/30/15 0548 05/01/15 0335  NA 129*  < > 126* 126* 127* 129* 128*  K 3.0*  < > 3.5 3.9 3.2* 3.3* 3.9  CL 91*  < > 90* 93* 91* 93* 92*  CO2 24  < > 25 20* 27 25 25   GLUCOSE 134*  < > 203* 209* 180* 160* 238*  BUN 33*  < > 29* 30* 28* 28* 30*  CREATININE 7.11*  < > 5.85* 5.92* 6.01* 6.02* 5.99*  CALCIUM 7.4*  < > 7.7* 7.4* 7.7* 7.3* 7.5*  PHOS 4.5  --   --   --   --  4.1 4.0  < > = values in this interval not displayed. Liver Function  Tests:  Recent Labs Lab 04/26/15 0429 04/29/15 2205 04/30/15 0548 05/01/15 0335  AST  --  53*  --   --   ALT  --  33  --   --   ALKPHOS  --  135*  --   --   BILITOT  --  0.7  --   --   PROT  --  7.1  --   --   ALBUMIN 2.1* 2.2* 1.8* 1.9*   No results for input(s): LIPASE, AMYLASE in the last 168 hours. CBC:  Recent Labs Lab 04/26/15 0429 04/28/15 1708 04/29/15 1054 04/29/15 2205 04/30/15 0548  WBC 11.1* 12.7* 10.1 10.8* 8.6  NEUTROABS  --   --   --  7.4  --   HGB 11.3* 11.4* 11.2* 11.8* 10.1*  HCT 34.6* 34.4* 33.5* 35.9* 29.8*  MCV 96.4 95.8 96.3 96.8 96.4  PLT 363 333 352 374 313   Cardiac Enzymes: No results for input(s): CKTOTAL, CKMB, CKMBINDEX, TROPONINI in the last 168 hours. CBG:  Recent Labs Lab 04/30/15 1119 04/30/15 1636 04/30/15 2102 05/01/15 0719 05/01/15 1118  GLUCAP 71 216* 168* 165* 144*    Recent Results (from the past 240 hour(s))  Culture, blood (routine x 2)     Status: None   Collection Time: 04/23/15  3:39 PM  Result Value Ref Range Status   Specimen Description BLOOD LEFT ARM  Final   Special Requests IN PEDIATRIC BOTTLE 3CC  Final   Culture NO GROWTH 5 DAYS  Final   Report Status 04/28/2015 FINAL  Final  Culture, blood (routine x 2)     Status: None   Collection Time: 04/23/15  9:30 PM  Result Value Ref Range Status   Specimen Description BLOOD LEFT HAND  Final   Special Requests BOTTLES DRAWN AEROBIC ONLY 5CC  Final   Culture NO GROWTH 5 DAYS  Final   Report Status 04/28/2015 FINAL  Final  Culture, body fluid-bottle     Status: None   Collection Time: 04/23/15 11:45 PM  Result Value Ref Range Status   Specimen Description FLUID PERITONEAL  Final   Special Requests BOTTLES DRAWN AEROBIC AND ANAEROBIC 10CC  Final   Culture NO GROWTH 5 DAYS  Final   Report Status 04/29/2015 FINAL  Final  Gram stain     Status: None   Collection Time: 04/23/15 11:45 PM  Result Value Ref Range Status   Specimen Description FLUID PERITONEAL   Final   Special Requests NONE  Final   Gram Stain NO WBC SEEN NO ORGANISMS SEEN   Final   Report Status 04/24/2015 FINAL  Final  Surgical pcr screen     Status: None   Collection Time: 04/30/15  6:51 AM  Result Value Ref Range Status   MRSA, PCR NEGATIVE NEGATIVE Final   Staphylococcus aureus NEGATIVE NEGATIVE Final    Comment:        The Xpert SA Assay (FDA approved for NASAL specimens in patients over 79 years of age), is one component of a comprehensive surveillance program.  Test performance has been validated by [facility] for patients greater than or equal to 17 year old. It is not intended to diagnose infection nor to guide or monitor treatment.      Studies: No results found.  Scheduled Meds: . allopurinol  100 mg Oral Daily  . aspirin  325 mg Oral Daily  . calcium carbonate  800 mg of elemental calcium Oral TID WC  . cefTAZidime (FORTAZ)  IV  500 mg Intravenous Q24H  . dialysis solution 1.5% low-MG/low-CA   Intraperitoneal 5 X Daily  . dialysis solution 2.5% low-MG/low-CA   Intraperitoneal 5 X Daily  . heparin  5,000 Units Subcutaneous 3 times per day  . insulin aspart  0-9 Units Subcutaneous TID WC  . insulin aspart  3 Units Subcutaneous TID WC  . insulin glargine  40 Units Subcutaneous QHS  . levothyroxine  112 mcg Oral QAC breakfast  . metoCLOPramide  10 mg Oral TID AC & HS  . midodrine  10 mg Oral TID  . multivitamin  1 tablet Oral QHS  . pantoprazole  40 mg Oral Q1200  . potassium chloride  30 mEq Oral BID  . sodium chloride  3 mL Intravenous Q12H  . sodium chloride  3 mL Intravenous Q12H  . sodium chloride  3 mL Intravenous Q12H   Continuous Infusions: . sodium chloride 10 mL/hr at 04/30/15 0908  . sodium chloride 10 mL/hr at 04/30/15 1138    Principal Problem:  Intractable nausea and vomiting Active Problems:   CHF (congestive heart failure) (HCC)   Sarcoidosis (HCC)   Sleep apnea   ESRD on dialysis ([place])   Gout   CAD (coronary artery  disease)   Orthostatic hypotension   Marzetta Board  [facility] Pager [phone]. If 7PM-7AM, please contact night-coverage at [email or web address], password [facility] 05/01/2015, 1:19 PM  LOS: 8 days

## 2015-05-01 NOTE — Progress Notes (Signed)
ANTIBIOTIC CONSULT NOTE - FOLLOW UP  Pharmacy Consult for Vancomycin Indication: gangrene L great toe  Allergies  Allergen Reactions  . Flu Virus Vaccine     Gets the flu, patient has bad reaction to the flu vaccine.  . Lipitor [Atorvastatin]     Leg cramps    Patient Measurements: Height: 5\' 10"  (177.8 cm) Weight: 234 lb 2.1 oz (106.2 kg) IBW/kg (Calculated) : 73  Vital Signs: Temp: 99 F (37.2 C) (11/24 0745) Temp Source: Oral (11/24 0745) BP: 139/58 mmHg (11/24 0745) Pulse Rate: 86 (11/24 0745) Intake/Output from previous day: 11/23 0701 - 11/24 0700 In: 12617.3 [P.O.:360; I.V.:157.3; IV Piggyback:100] Out: 13700  Intake/Output from this shift: Total I/O In: 3240 [P.O.:240; Other:3000] Out: 3150 [Urine:50; Other:3100]  Labs:  Recent Labs  04/29/15 1054  04/29/15 2205 04/30/15 0548 05/01/15 0335  WBC 10.1  --  10.8* 8.6  --   HGB 11.2*  --  11.8* 10.1*  --   PLT 352  --  374 313  --   CREATININE 6.13*  < > 6.01* 6.02* 5.99*  < > = values in this interval not displayed. Estimated Creatinine Clearance: 13.8 mL/min (by C-G formula based on Cr of 5.99).  Recent Labs  05/01/15 0335  [facility] 64     Microbiology: Recent Results (from the past 720 hour(s))  Culture, blood (routine x 2)     Status: None   Collection Time: 04/23/15  3:39 PM  Result Value Ref Range Status   Specimen Description BLOOD LEFT ARM  Final   Special Requests IN PEDIATRIC BOTTLE 3CC  Final   Culture NO GROWTH 5 DAYS  Final   Report Status 04/28/2015 FINAL  Final  Culture, blood (routine x 2)     Status: None   Collection Time: 04/23/15  9:30 PM  Result Value Ref Range Status   Specimen Description BLOOD LEFT HAND  Final   Special Requests BOTTLES DRAWN AEROBIC ONLY 5CC  Final   Culture NO GROWTH 5 DAYS  Final   Report Status 04/28/2015 FINAL  Final  Culture, body fluid-bottle     Status: None   Collection Time: 04/23/15 11:45 PM  Result Value Ref Range Status   Specimen  Description FLUID PERITONEAL  Final   Special Requests BOTTLES DRAWN AEROBIC AND ANAEROBIC 10CC  Final   Culture NO GROWTH 5 DAYS  Final   Report Status 04/29/2015 FINAL  Final  Gram stain     Status: None   Collection Time: 04/23/15 11:45 PM  Result Value Ref Range Status   Specimen Description FLUID PERITONEAL  Final   Special Requests NONE  Final   Gram Stain NO WBC SEEN NO ORGANISMS SEEN   Final   Report Status 04/24/2015 FINAL  Final  Surgical pcr screen     Status: None   Collection Time: 04/30/15  6:51 AM  Result Value Ref Range Status   MRSA, PCR NEGATIVE NEGATIVE Final   Staphylococcus aureus NEGATIVE NEGATIVE Final    Comment:        The Xpert SA Assay (FDA approved for NASAL specimens in patients over 6 years of age), is one component of a comprehensive surveillance program.  Test performance has been validated by [facility] for patients greater than or equal to 43 year old. It is not intended to diagnose infection nor to guide or monitor treatment.     Anti-infectives    Start     Dose/Rate Route Frequency Ordered Stop   04/30/15  0600  ceFAZolin (ANCEF) IVPB 2 g/50 mL premix     2 g 100 mL/hr over 30 Minutes Intravenous To Surgery 04/29/15 2100 04/30/15 0955   04/27/15 1345  vancomycin (VANCOCIN) 1,250 mg in sodium chloride 0.9 % 250 mL IVPB     1,250 mg 166.7 mL/hr over 90 Minutes Intravenous  Once 04/27/15 1339 04/27/15 1905   04/25/15 1800  cefTAZidime (FORTAZ) 500 mg in dextrose 5 % 50 mL IVPB     500 mg 100 mL/hr over 30 Minutes Intravenous Every 24 hours 04/25/15 1408     04/24/15 1800  cefTAZidime (FORTAZ) 1 g in dextrose 5 % 50 mL IVPB  Status:  Discontinued     1 g 100 mL/hr over 30 Minutes Intravenous Every 24 hours 04/24/15 1150 04/25/15 1408   04/23/15 1815  vancomycin (VANCOCIN) 2,000 mg in sodium chloride 0.9 % 500 mL IVPB     2,000 mg 250 mL/hr over 120 Minutes Intravenous  Once 04/23/15 1813 04/23/15 2226   04/23/15 1815  cefTAZidime  (FORTAZ) 1 g in dextrose 5 % 50 mL IVPB     1 g 100 mL/hr over 30 Minutes Intravenous  Once 04/23/15 1813 04/23/15 1907   04/23/15 1745  vancomycin (VANCOCIN) IVPB 1000 mg/200 mL premix  Status:  Discontinued     1,000 mg 200 mL/hr over 60 Minutes Intravenous  Once 04/23/15 1740 04/23/15 1813      Assessment and Plan: 74 yo M on antibiotic day #9 for L great toe infection s/p amputation yesterday.   Can likely discontinue IV antibiotic therapy 24-48 hours after surgery.    ESRD pt dialyzing with PD. We have been dosing his Vancomycin based on levels. Last dose 11/21 with VR this morning of 22. Will not redose today as vancomycin level slightly supratherapeutic  Will follow along with antibiotic plans and redose if necessary.    Rob Evette Doffing, [place]rock.D. PGY1 Resident Pager [phone] 05/01/2015 1:20 PM

## 2015-05-02 DIAGNOSIS — I96 Gangrene, not elsewhere classified: Secondary | ICD-10-CM | POA: Diagnosis present

## 2015-05-02 LAB — CBC
HCT: 32.4 % — ABNORMAL LOW (ref 39.0–52.0)
Hemoglobin: 10.5 g/dL — ABNORMAL LOW (ref 13.0–17.0)
MCH: 31.6 pg (ref 26.0–34.0)
MCHC: 32.4 g/dL (ref 30.0–36.0)
MCV: 97.6 fL (ref 78.0–100.0)
Platelets: 342 10*3/uL (ref 150–400)
RBC: 3.32 MIL/uL — ABNORMAL LOW (ref 4.22–5.81)
RDW: 14 % (ref 11.5–15.5)
WBC: 10.6 10*3/uL — ABNORMAL HIGH (ref 4.0–10.5)

## 2015-05-02 LAB — RENAL FUNCTION PANEL
ALBUMIN: 1.8 g/dL — AB (ref 3.5–5.0)
ANION GAP: 8 (ref 5–15)
BUN: 30 mg/dL — ABNORMAL HIGH (ref 6–20)
CALCIUM: 7.6 mg/dL — AB (ref 8.9–10.3)
CO2: 26 mmol/L (ref 22–32)
Chloride: 96 mmol/L — ABNORMAL LOW (ref 101–111)
Creatinine, Ser: 6.01 mg/dL — ABNORMAL HIGH (ref 0.61–1.24)
GFR, EST AFRICAN AMERICAN: 10 mL/min — AB (ref >60)
GFR, EST NON AFRICAN AMERICAN: 8 mL/min — AB (ref >60)
Glucose, Bld: 169 mg/dL — ABNORMAL HIGH (ref 65–99)
PHOSPHORUS: 4.2 mg/dL (ref 2.5–4.6)
Potassium: 4.2 mmol/L (ref 3.5–5.1)
SODIUM: 130 mmol/L — AB (ref 135–145)

## 2015-05-02 LAB — GLUCOSE, CAPILLARY
Glucose-Capillary: 103 mg/dL — ABNORMAL HIGH (ref 65–99)
Glucose-Capillary: 124 mg/dL — ABNORMAL HIGH (ref 65–99)

## 2015-05-02 MED ORDER — DOXYCYCLINE HYCLATE 100 MG PO TABS: 100.0000 mg | ORAL_TABLET | Freq: Two times a day (BID) | ORAL | Status: DC

## 2015-05-02 MED ORDER — OXYCODONE-ACETAMINOPHEN 5-325 MG PO TABS: 1.0000 | ORAL_TABLET | Freq: Four times a day (QID) | ORAL | Status: DC | PRN

## 2015-05-02 MED ORDER — METOCLOPRAMIDE HCL 5 MG PO TABS: 10.0000 mg | ORAL_TABLET | Freq: Three times a day (TID) | ORAL | Status: AC

## 2015-05-02 NOTE — Care Management Note (Signed)
Case Management Note  Patient Details  Name: Jack Chung MRN: [id number] Date of Birth: 01/26/1941  Subjective/Objective:         CM following for progression and d/c planning.           Action/Plan: Met with pt and wife, wife is active with [facility] and both pt and wife would like [facility] to provide HHPT for this pt. [facility] notified. 3:1 ordered for pt, he has a walker at home. No other needs identified.   Expected Discharge Date:     05/02/2015             Expected Discharge Plan:  Home w Home Health Services  In-House Referral:  NA  Discharge planning Services  CM Consult  Post Acute Care Choice:  Durable Medical Equipment, Home Health Choice offered to:  Patient  DME Arranged:  3-N-1 DME Agency:  [facility]  HH Arranged:  PT HH Agency:  [facility]  Status of Service:  Completed, signed off  Medicare Important Message Given:  Yes Date Medicare IM Given:    Medicare IM give by:    Date Additional Medicare IM Given:    Additional Medicare Important Message give by:     If discussed at Long Length of Stay Meetings, dates discussed:    Additional Comments:  Royal, Cheryl U, RN 05/02/2015, 10:20 AM  

## 2015-05-02 NOTE — Progress Notes (Signed)
Jack Chung to be D/C'd Home per MD order.  Discussed prescriptions and follow up appointments with the patient. Prescriptions given to patient, medication list explained in detail. Pt verbalized understanding.    Medication List    STOP taking these medications        ciprofloxacin 500 MG tablet  Commonly known as:  CIPRO     clindamycin 300 MG capsule  Commonly known as:  CLEOCIN      TAKE these medications        allopurinol 100 MG tablet  Commonly known as:  ZYLOPRIM  Take 100 mg by mouth daily.     aspirin 325 MG tablet  Take 325 mg by mouth daily.     calcium carbonate 500 MG chewable tablet  Commonly known as:  TUMS - dosed in mg elemental calcium  Chew 2 tablets by mouth 3 (three) times daily with meals.     cetirizine 10 MG tablet  Commonly known as:  ZYRTEC  Take 10 mg by mouth daily.     doxycycline 100 MG tablet  Commonly known as:  VIBRA-TABS  Take 1 tablet (100 mg total) by mouth 2 (two) times daily.     insulin aspart 100 UNIT/ML injection  Commonly known as:  novoLOG  Inject 10-15 Units into the skin 3 (three) times daily with meals. Take none if blood sugar < 150, 151-200 2 units, 201-250 4 units, 251-300 6 units, 301-350 8 units     Insulin Glargine 100 UNIT/ML Solostar Pen  Commonly known as:  LANTUS  Inject 45 units into the skin daily. Dx 250.00     lactulose 10 GM/15ML solution  Commonly known as:  CHRONULAC  Take 10 g by mouth every 4 (four) hours as needed for mild constipation.     levothyroxine 112 MCG tablet  Commonly known as:  SYNTHROID, LEVOTHROID  LABS/APPOINTMENT OVERDUE, 1 by mouth daily in the morning 30 minutes before breakfast for Thyroid     loperamide 2 MG capsule  Commonly known as:  IMODIUM  Take 2 mg by mouth daily as needed for diarrhea or loose stools.     metoCLOPramide 5 MG tablet  Commonly known as:  REGLAN  Take 2 tablets (10 mg total) by mouth 3 (three) times daily before meals.     midodrine 10 MG tablet   Commonly known as:  PROAMATINE  Take 1 tablet (10 mg total) by mouth 3 (three) times daily.     MULTI VITAMIN DAILY PO  Take 1 tablet by mouth daily.     omeprazole 10 MG capsule  Commonly known as:  PRILOSEC  Take 10 mg by mouth daily.     oxyCODONE-acetaminophen 5-325 MG tablet  Commonly known as:  PERCOCET/ROXICET  Take 1-2 tablets by mouth every 6 (six) hours as needed for moderate pain.     PERITONEAL DIALYSIS SOLUTIONS IP  Inject 2,500 Units into the peritoneum 4 (four) times daily.     potassium chloride SA 20 MEQ tablet  Commonly known as:  K-DUR,KLOR-CON  Take 20 mEq by mouth daily.     silver sulfADIAZINE 1 % cream  Commonly known as:  SILVADENE  Apply 1 application topically daily.        Filed Vitals:   05/01/15 2020 05/02/15 0509  BP: 117/52 119/53  Pulse: 66 84  Temp: 98 F (36.7 C) 100.2 F (37.9 C)  Resp: 18 22    Skin clean, dry and intact without evidence of skin break  down, no evidence of skin tears noted. IV catheter discontinued intact. Site without signs and symptoms of complications. Dressing and pressure applied. Pt denies pain at this time. No complaints noted.  An After Visit Summary was printed and given to the patient. Patient escorted via [place], and D/C home via private auto.  Carole Civil RN [facility] [address] Phone [phone]

## 2015-05-02 NOTE — Discharge Summary (Addendum)
Physician Discharge Summary  Jack Chung [id number] DOB: 06-01-1941 DOA: 04/23/2015  PCP: Jack Neer, MD  Admit date: 04/23/2015 Discharge date: 05/02/2015  Time spent: > 30 minutes  Recommendations for Outpatient Follow-up:  1. Follow up with [name] in 2 weeks   Discharge Diagnoses:  Principal Problem:   Intractable nausea and vomiting Active Problems:   CHF (congestive heart failure) ([place])   Sarcoidosis ([facility])   Sleep apnea   ESRD on dialysis [facility])   Gout   CAD (coronary artery disease)   Orthostatic hypotension   Gangrene of toe ([facility])  Discharge Condition: stable  Diet recommendation: diabetic  Filed Weights   04/28/15 2029 04/29/15 2040 05/01/15 2020  Weight: 105.9 kg (233 lb 7.5 oz) 106.2 kg (234 lb 2.1 oz) 107.548 kg (237 lb 1.6 oz)    History of present illness:  See H&P, Labs, Consult and Test reports for all details in brief, patient is a 74 y.o. male with history of insulin-dependent DM, ESRD on peritoneal dialysis, sarcoidosis on prednisone, CAD s/p 3-vessel CABG [id number], diastolic HF (EF [id number] in 01/2015), hypotension admitted for toe infection  Hospital Course:  Gangrene of the left great toe - blood culture 2, lactate mildly elevated on admission, x-rays of the left toe w/o signs of osteomyelitis, vascular surgery evaluated patient for safer level of amputation, appreciate consult. ABI with TBI and aortogram done (demonstrating severe calcifications and through collaterals perfusion only up to his Metatarsal area). Orthopedic surgery was consulted and now patient is s/p great toe amputation trough MTP joint on 11/23. He was maintained on broad spectrum antibiotics in the hospital and will be d/c with doxycycline for 5 additional days.  Intractable nausea vomiting - Likely an exacerbation of gastroparesis vs under-dialysis vs infection (left great toe gangrene), gastric emptying study checked and has confirmed gastroparesis, improved with reglan which  will be continued after discharge. Esophagogram also performed during this admission and demonstrated no strictures, but positive delay transit History of orthostatic hypotension - continue midodrine Uncontrolled diabetes - with A1C 8.7 Hypokalemia corrected by nephrology with PD/supplementation  Chronic CHF (congestive heart failure) ([facility]) - patient without any signs and symptoms of fluid overload of exacerbation, most recent 0000000, diastolic in nature Hx of Sarcoidosis ([facility]) - previously on prednisone; stable and denying any complaints currently  Sleep apnea - Not on CPAP ESRD on dialysis [facility]) on peritoneal dialysis - Renal service following for continuation of PD and further rec's on electrolytes repletion Obesity Body mass index is 34.98 kg/(m^2), low calorie diet and increase physical activity discussed with patient  Gout - continue allopurinol CAD (coronary artery disease) - multivessel coronary artery disease, medical therapy recommended, followed by Dr. Debara Chung; status post 3 vessel CABG in 2011 - no CP and no SOB - troponin with flat elevation at 0.09 and no suggesting ACS; most likely due to chronic renal failure - no abnormalities on tele or EKG suggesting ischemia Pressure ulcer - just redness for now  Procedures:  Toe amputation   Consultations:  Nephrology   Vascular surgery   Orthopedic surgery   Discharge Exam: Filed Vitals:   05/01/15 0456 05/01/15 0745 05/01/15 2020 05/02/15 0509  BP: 126/53 139/58 117/52 119/53  Pulse: 80 86 66 84  Temp: 99.5 F (37.5 C) 99 F (37.2 C) 98 F (36.7 C) 100.2 F (37.9 C)  TempSrc: Oral Oral Oral Oral  Resp: 17 17 18 22   Height:      Weight:   107.548 kg (237 lb 1.6  oz)   SpO2: 97% 97% 96% 94%    General: NAD Cardiovascular: RRR Respiratory: CTA biL  Discharge Instructions Activity:  As tolerated   Get Medicines reviewed and adjusted: Please take all your medications with you for your next visit with your  Primary MD  Please request your Primary MD to go over all hospital tests and procedure/radiological results at the follow up, please ask your Primary MD to get all Hospital records sent to his/her office.  If you experience worsening of your admission symptoms, develop shortness of breath, life threatening emergency, suicidal or homicidal thoughts you must seek medical attention immediately by calling 911 or calling your MD immediately if symptoms less severe.  You must read complete instructions/literature along with all the possible adverse reactions/side effects for all the Medicines you take and that have been prescribed to you. Take any new Medicines after you have completely understood and accpet all the possible adverse reactions/side effects.   Do not drive when taking Pain medications.   Do not take more than prescribed Pain, Sleep and Anxiety Medications  Special Instructions: If you have smoked or chewed Tobacco in the last 2 yrs please stop smoking, stop any regular Alcohol and or any Recreational drug use.  Wear Seat belts while driving.  Please note  You were cared for by a hospitalist during your hospital stay. Once you are discharged, your primary care physician will handle any further medical issues. Please note that NO REFILLS for any discharge medications will be authorized once you are discharged, as it is imperative that you return to your primary care physician (or establish a relationship with a primary care physician if you do not have one) for your aftercare needs so that they can reassess your need for medications and monitor your lab values.     Discharge Instructions    Elevate operative extremity    Complete by:  As directed      Non weight bearing    Complete by:  As directed   Laterality:  left  Extremity:  Lower     Post-op shoe    Complete by:  As directed             Medication List    STOP taking these medications        ciprofloxacin 500 MG  tablet  Commonly known as:  CIPRO     clindamycin 300 MG capsule  Commonly known as:  CLEOCIN      TAKE these medications        allopurinol 100 MG tablet  Commonly known as:  ZYLOPRIM  Take 100 mg by mouth daily.     aspirin 325 MG tablet  Take 325 mg by mouth daily.     calcium carbonate 500 MG chewable tablet  Commonly known as:  TUMS - dosed in mg elemental calcium  Chew 2 tablets by mouth 3 (three) times daily with meals.     cetirizine 10 MG tablet  Commonly known as:  ZYRTEC  Take 10 mg by mouth daily.     doxycycline 100 MG tablet  Commonly known as:  VIBRA-TABS  Take 1 tablet (100 mg total) by mouth 2 (two) times daily.     insulin aspart 100 UNIT/ML injection  Commonly known as:  novoLOG  Inject 10-15 Units into the skin 3 (three) times daily with meals. Take none if blood sugar < 150, 151-200 2 units, 201-250 4 units, 251-300 6 units, 301-350 8 units  Insulin Glargine 100 UNIT/ML Solostar Pen  Commonly known as:  LANTUS  Inject 45 units into the skin daily. Dx 250.00     lactulose 10 GM/15ML solution  Commonly known as:  CHRONULAC  Take 10 g by mouth every 4 (four) hours as needed for mild constipation.     levothyroxine 112 MCG tablet  Commonly known as:  SYNTHROID, LEVOTHROID  LABS/APPOINTMENT OVERDUE, 1 by mouth daily in the morning 30 minutes before breakfast for Thyroid     loperamide 2 MG capsule  Commonly known as:  IMODIUM  Take 2 mg by mouth daily as needed for diarrhea or loose stools.     metoCLOPramide 5 MG tablet  Commonly known as:  REGLAN  Take 2 tablets (10 mg total) by mouth 3 (three) times daily before meals.     midodrine 10 MG tablet  Commonly known as:  PROAMATINE  Take 1 tablet (10 mg total) by mouth 3 (three) times daily.     MULTI VITAMIN DAILY PO  Take 1 tablet by mouth daily.     omeprazole 10 MG capsule  Commonly known as:  PRILOSEC  Take 10 mg by mouth daily.     oxyCODONE-acetaminophen 5-325 MG tablet  Commonly  known as:  PERCOCET/ROXICET  Take 1-2 tablets by mouth every 6 (six) hours as needed for moderate pain.     PERITONEAL DIALYSIS SOLUTIONS IP  Inject 2,500 Units into the peritoneum 4 (four) times daily.     potassium chloride SA 20 MEQ tablet  Commonly known as:  K-DUR,KLOR-CON  Take 20 mEq by mouth daily.     silver sulfADIAZINE 1 % cream  Commonly known as:  SILVADENE  Apply 1 application topically daily.       Follow-up Information    Follow up with DUDA,MARCUS V, MD In 2 weeks.   Specialty:  Orthopedic Surgery   Why:  Office is CLOSED today. Office will re-open on Monday 05/04/2015. Patient is to call on Monday to make their own appointment at that time. Thank You.   Contact information:   [place] [id number] [phone]       Follow up with SHAW,KIMBERLEE, MD. Schedule an appointment as soon as possible for a visit in 2 weeks.   Specialty:  Family Medicine   Why:  Office is CLOSED today. Office will re-open on Monday 05/04/2015. Patient is to call on Monday to make their own appointment at that time. Thank You   Contact information:   [address] [place]  [id number] [phone]       The results of significant diagnostics from this hospitalization (including imaging, microbiology, ancillary and laboratory) are listed below for reference.    Significant Diagnostic Studies: Ct Abdomen Pelvis Wo Contrast  04/23/2015  CLINICAL DATA:  Subacute onset of postprandial vomiting. Initial encounter. EXAM: CT ABDOMEN AND PELVIS WITHOUT CONTRAST TECHNIQUE: Multidetector CT imaging of the abdomen and pelvis was performed following the standard protocol without IV contrast. COMPARISON:  CT of the abdomen and pelvis from 07/17/2013, and abdominal ultrasound performed 04/21/2015 FINDINGS: Scattered nodules at the right lung base demonstrate mild calcification and are likely benign. Wall thickening along the distal esophagus could reflect  esophagitis. Would correlate for any associated symptoms. Diffuse coronary artery calcifications are seen. Small to moderate volume ascites is seen within the abdomen and pelvis. A peritoneal dialysis catheter is noted at the left hemipelvis. Mild vague soft tissue inflammation is noted within the mesentery, nonspecific  in appearance. The liver and spleen are unremarkable in appearance. The gallbladder is within normal limits. The pancreas and adrenal glands are unremarkable. The kidneys are unremarkable in appearance. There is no evidence of hydronephrosis. No renal or ureteral stones are seen. Nonspecific perinephric stranding is noted bilaterally. No free fluid is identified. The small bowel is unremarkable in appearance. The stomach is within normal limits. No acute vascular abnormalities are seen. Scattered calcification is noted along the abdominal aorta and its branches. The appendix is normal in caliber, without evidence of appendicitis. The colon is unremarkable in appearance. The bladder is relatively decompressed and grossly unremarkable in appearance. The prostate remains normal in size. No inguinal lymphadenopathy is seen. No acute osseous abnormalities are identified. IMPRESSION: 1. Mild wall thickening along the distal esophagus could reflect esophagitis. Would correlate for associated symptoms. 2. Small to moderate volume ascites within the abdomen and pelvis. Peritoneal dialysis catheter noted at the right hemipelvis. 3. Mild vague nonspecific soft tissue inflammation noted within the mesentery. 4. Scattered calcification along the abdominal aorta and its branches. 5. Diffuse coronary artery calcifications seen. 6. Scattered nodules at the right lung base demonstrate mild calcification and are likely benign. Electronically Signed   By: Garald Balding M.D.   On: 04/23/2015 19:31   Dg Chest 2 View  04/23/2015  CLINICAL DATA:  Acute onset of shortness of breath. Initial encounter. EXAM: CHEST  2  VIEW COMPARISON:  Chest radiograph from 12/30/2014 FINDINGS: The lungs are well-aerated and clear. There is no evidence of focal opacification, pleural effusion or pneumothorax. The heart is borderline enlarged. The patient is status post median sternotomy. No acute osseous abnormalities are seen. IMPRESSION: No acute cardiopulmonary process seen. Electronically Signed   By: Garald Balding M.D.   On: 04/23/2015 19:01   Nm Gastric Emptying  04/28/2015  CLINICAL DATA:  Recurrent vomiting. EXAM: NUCLEAR MEDICINE GASTRIC EMPTYING SCAN TECHNIQUE: After oral ingestion of radiolabeled meal, sequential abdominal images were obtained for 4 hours. Percentage of activity emptying the stomach was calculated at 1 hour, 2 hour, 3 hour, and 4 hours. RADIOPHARMACEUTICALS:  2.0 mCi Tc-8m MDP labeled sulfur colloid orally COMPARISON:  None. FINDINGS: Expected location of the stomach in the left upper quadrant. Ingested meal empties the stomach gradually over the course of the study. 44% emptied at 1 hr ( normal >= 10%) 45% emptied at 2 hr ( normal >= 40%) 47% emptied at 3 hr ( normal >= 70%) 60% emptied at 4 hr ( normal >= 90%) IMPRESSION: Delayed gastric emptying study. Electronically Signed   By: Marijo Conception, M.D.   On: 04/28/2015 15:23   US Abdomen Complete  04/21/2015  CLINICAL DATA:  NAUSEA AND VOMITING. Symptoms for 1 week. No reported pain. EXAM: ULTRASOUND ABDOMEN COMPLETE COMPARISON:  CT, 07/17/2013 FINDINGS: Gallbladder: No gallstones or wall thickening visualized. No sonographic Murphy sign noted. Common bile duct: Diameter: 3.8 mm Liver: Coarsened echotexture with no discrete mass or focal lesion. Normal in size. Hepatopetal flow documented in the portal vein. IVC: Obscured by midline bowel gas. Pancreas: Obscured by midline bowel gas. Spleen: Size and appearance within normal limits. Right Kidney: Length: 8.5 cm. Mild increased parenchymal echogenicity with cortical thinning. No mass, stone or  hydronephrosis. Left Kidney: Length: 10.1 cm. Echogenicity within normal limits. No mass or hydronephrosis visualized. Abdominal aorta: Proximal and midportion obscured by bowel gas. Normal caliber distal aorta and iliac arteries. Other findings: Small to moderate amount of ascites. IMPRESSION: 1. No acute findings. 2. No  gallstones.  No bile duct dilation. 3. Exam somewhat limited. Bowel gas obscures midline structures including the inferior vena cava, most of the aorta and the pancreas. 4. Right renal cortical thinning. Increased parenchymal echogenicity. This is consistent with medical renal disease. 5. Coarsened echotexture of the liver, nonspecific. 6. Small to moderate amount of ascites. Electronically Signed   By: Lajean Manes M.D.   On: 04/21/2015 15:38   Dg Esophagus  04/24/2015  CLINICAL DATA:  Dysphagia with lower chest pressure when eating. Initial encounter. EXAM: ESOPHOGRAM/BARIUM SWALLOW TECHNIQUE: Single contrast examination was performed using  thin barium. FLUOROSCOPY TIME:  Radiation Exposure Index (as provided by the fluoroscopic device): If the device does not provide the exposure index: Fluoroscopy Time:  1 minute and 6 seconds Number of Acquired Images:  All images acquired with fluoro store. COMPARISON:  Abdominal CT 04/23/2015. FINDINGS: Study was performed in the supine, semi erect and right lateral decubitus positions due to the patient's limited mobility. The esophageal motility is within normal limits. No mucosal ulceration, stricture or mass identified. No laryngeal penetration or cervical esophageal abnormality identified. No significant gastroesophageal reflux was elicited with the water siphon test. A 13 mm barium tablet was administered. This was retained in the distal esophagus for approximately 2 minutes. With the aid of thin barium and applesauce, this tablet did eventually pass into the stomach. IMPRESSION: 1. No significant findings identified. No evidence of esophagitis  or significant stricture. 2. Barium tablet was temporarily retained within the distal esophagus, although did pass into the stomach. Electronically Signed   By: Richardean Sale M.D.   On: 04/24/2015 09:51   Dg Foot Complete Left  04/23/2015  CLINICAL DATA:  Left great toe cellulitis and cyanosis.  Diabetes. EXAM: LEFT FOOT - COMPLETE 3+ VIEW COMPARISON:  None. FINDINGS: There is no evidence of fracture or dislocation. No evidence of osteolysis or periostitis. Old fracture deformity of the fifth metatarsal is noted. Prominent dorsal and plantar calcaneal bone spurs noted. Diffuse peripheral vascular calcification noted. IMPRESSION: No radiographic evidence of osteomyelitis or other acute findings. Electronically Signed   By: Earle Gell M.D.   On: 04/23/2015 18:10    Microbiology: Recent Results (from the past 240 hour(s))  Culture, blood (routine x 2)     Status: None   Collection Time: 04/23/15  3:39 PM  Result Value Ref Range Status   Specimen Description BLOOD LEFT ARM  Final   Special Requests IN PEDIATRIC BOTTLE 3CC  Final   Culture NO GROWTH 5 DAYS  Final   Report Status 04/28/2015 FINAL  Final  Culture, blood (routine x 2)     Status: None   Collection Time: 04/23/15  9:30 PM  Result Value Ref Range Status   Specimen Description BLOOD LEFT HAND  Final   Special Requests BOTTLES DRAWN AEROBIC ONLY 5CC  Final   Culture NO GROWTH 5 DAYS  Final   Report Status 04/28/2015 FINAL  Final  Culture, body fluid-bottle     Status: None   Collection Time: 04/23/15 11:45 PM  Result Value Ref Range Status   Specimen Description FLUID PERITONEAL  Final   Special Requests BOTTLES DRAWN AEROBIC AND ANAEROBIC 10CC  Final   Culture NO GROWTH 5 DAYS  Final   Report Status 04/29/2015 FINAL  Final  Gram stain     Status: None   Collection Time: 04/23/15 11:45 PM  Result Value Ref Range Status   Specimen Description FLUID PERITONEAL  Final   Special Requests NONE  Final   Gram Stain NO WBC SEEN NO  ORGANISMS SEEN   Final   Report Status 04/24/2015 FINAL  Final  Surgical pcr screen     Status: None   Collection Time: 04/30/15  6:51 AM  Result Value Ref Range Status   MRSA, PCR NEGATIVE NEGATIVE Final   Staphylococcus aureus NEGATIVE NEGATIVE Final    Comment:        The Xpert SA Assay (FDA approved for NASAL specimens in patients over 2 years of age), is one component of a comprehensive surveillance program.  Test performance has been validated by [facility] for patients greater than or equal to 49 year old. It is not intended to diagnose infection nor to guide or monitor treatment.      Labs: Basic Metabolic Panel:  Recent Labs Lab 04/26/15 0429 04/28/15 0438 04/28/15 1708 04/29/15 1054 04/29/15 1335 04/29/15 1837 04/29/15 2205 04/30/15 0548 05/01/15 0335 05/02/15 0431  NA 129* 129*  --  127* 126* 126* 127* 129* 128* 130*  K 3.0* 2.8*  --  2.9* 3.5 3.9 3.2* 3.3* 3.9 4.2  CL 91* 92*  --  91* 90* 93* 91* 93* 92* 96*  CO2 24 26  --  26 25 20* 27 25 25 26   GLUCOSE 134* 133*  --  135* 203* 209* 180* 160* 238* 169*  BUN 33* 31*  --  30* 29* 30* 28* 28* 30* 30*  CREATININE 7.11* 6.21* 6.34* 6.13* 5.85* 5.92* 6.01* 6.02* 5.99* 6.01*  CALCIUM 7.4* 7.3*  --  7.4* 7.7* 7.4* 7.7* 7.3* 7.5* 7.6*  PHOS 4.5  --   --   --   --   --   --  4.1 4.0 4.2   Liver Function Tests:  Recent Labs Lab 04/26/15 0429 04/29/15 2205 04/30/15 0548 05/01/15 0335 05/02/15 0431  AST  --  53*  --   --   --   ALT  --  33  --   --   --   ALKPHOS  --  135*  --   --   --   BILITOT  --  0.7  --   --   --   PROT  --  7.1  --   --   --   ALBUMIN 2.1* 2.2* 1.8* 1.9* 1.8*   No results for input(s): LIPASE, AMYLASE in the last 168 hours. No results for input(s): AMMONIA in the last 168 hours. CBC:  Recent Labs Lab 04/28/15 1708 04/29/15 1054 04/29/15 2205 04/30/15 0548 05/02/15 0431  WBC 12.7* 10.1 10.8* 8.6 10.6*  NEUTROABS  --   --  7.4  --   --   HGB 11.4* 11.2* 11.8* 10.1*  10.5*  HCT 34.4* 33.5* 35.9* 29.8* 32.4*  MCV 95.8 96.3 96.8 96.4 97.6  PLT 333 352 374 313 342   CBG:  Recent Labs Lab 05/01/15 1118 05/01/15 1729 05/01/15 2018 05/02/15 0758 05/02/15 1135  GLUCAP 144* 219* 289* 103* 124*       Signed:  GHERGHE, COSTIN  [facility] 05/02/2015, 1:20 PM

## 2015-05-02 NOTE — Progress Notes (Signed)
[facility] Progress Note  Assessment/Plan: 1. Nausea/ vomiting - x 2 weeks. Hx of similar problems earlier this year. 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 We are giving him 18L per day here- he is an average to slow transporter so is on the appropriate modality with CAPD (tested last in 2014). Eating solids last night and today without problems. Swallowing study neg- did have brief retention of tablet in distal esophagus but then passed into stomach. GES 11/21 shows delayed gastric emptying.Sx resolved.  2. ESRD f/u Dr Joelyn Oms, cont PD - Hyponatremia slowly resolving NA 130 today. Continue current PD regime ; Daily renal profile ordered. PD fld clear, no fibrin. No redness reported at PD exit site.  3. DM 30 yrs Per primary  4. 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 CAD hx CABG 2011 7. Left great toe ischemia/gangrene -. On IV Fortaz and Vanc. S/p first ray amputation 11/23 [name]; possible d/c Friday; he is supposed to be non weight bearing. Hopefully DC home today.  8. Anemia - Hgb 10.5 11/25 Continue to monitor. No OP ESA ordered.  9. Nutrition - alb low 1.8 today - need ^ protein diet, prostat/nepro- Carb mod diet. 10.Hypokalemia: K+ 4.2 today (05/02/15) Cont KDur 30 MEQ PO BID. diet not being restricted  [employer] Brown NP-C 05/02/2015, 10:13 AM  [facility] [phone]  Subjective: "It's (L foot) still sore, but I'm OK".  No C/O fevers, chills, SOB. Pleasant and cooperative.  Objective Filed Vitals:   05/01/15 0456 05/01/15 0745 05/01/15 2020 05/02/15 0509  BP: 126/53 139/58 117/52 119/53  Pulse: 80 86 66 84  Temp: 99.5 F (37.5 C) 99 F (37.2 C) 98 F (36.7 C) 100.2 F (37.9 C)  TempSrc: Oral Oral Oral Oral  Resp: 17 17 18 22   Height:      Weight:   107.548 kg (237 lb 1.6 oz)   SpO2:  97% 97% 96% 94%   Physical Exam General: chronically ill appearing male, looks older than stated age, NAD.  Heart: S1, S2. RRR. No M/R/G Lungs: Bilateral breaths sounds CTA/AP Abdomen: Soft nontender. PD catheter LUQ, Drsg CDI.  Extremities: No LE edema. LLE in ace wrap, drsg CDI. Has been walking with walker with PT Neuro: A & O X 3. Moves all extremities.   Dialysis Access: PD cath Drsg CDI. RUA AVF + Bruit  Dialysis Orders: CAPD with 5 exchanges per day at 8am, 11am, 2pm, 5pm and overnight dwell at 8pm - followed by Dr. Pearson Grippe who recently increased dwell volume from 2.5L to 3L but pt has yet to receive those fluids. Uses 1.5% then 2.5% x3 and last exchange with 1.5%. EDW is 105kg but currently he's down to 100.5kg because of poor intake.   Additional Objective Labs: Basic Metabolic Panel:  Recent Labs Lab 04/30/15 0548 05/01/15 0335 05/02/15 0431  NA 129* 128* 130*  K 3.3* 3.9 4.2  CL 93* 92* 96*  CO2 25 25 26   GLUCOSE 160* 238* 169*  BUN 28* 30* 30*  CREATININE 6.02* 5.99* 6.01*  CALCIUM 7.3* 7.5* 7.6*  PHOS 4.1 4.0 4.2   Liver Function Tests:  Recent Labs Lab 04/29/15 2205 04/30/15 0548 05/01/15 0335 05/02/15 0431  AST 53*  --   --   --   ALT 33  --   --   --  ALKPHOS 135*  --   --   --   BILITOT 0.7  --   --   --   PROT 7.1  --   --   --   ALBUMIN 2.2* 1.8* 1.9* 1.8*   No results for input(s): LIPASE, AMYLASE in the last 168 hours. CBC:  Recent Labs Lab 04/28/15 1708 04/29/15 1054 04/29/15 2205 04/30/15 0548 05/02/15 0431  WBC 12.7* 10.1 10.8* 8.6 10.6*  NEUTROABS  --   --  7.4  --   --   HGB 11.4* 11.2* 11.8* 10.1* 10.5*  HCT 34.4* 33.5* 35.9* 29.8* 32.4*  MCV 95.8 96.3 96.8 96.4 97.6  PLT 333 352 374 313 342   Blood Culture    Component Value Date/Time   SDES FLUID PERITONEAL 04/23/2015 2345   SDES FLUID PERITONEAL 04/23/2015 2345   SPECREQUEST BOTTLES DRAWN AEROBIC AND ANAEROBIC 10CC 04/23/2015 2345   [place]  04/23/2015 2345   CULT NO GROWTH 5 DAYS 04/23/2015 2345   REPTSTATUS 04/29/2015 FINAL 04/23/2015 2345   REPTSTATUS 04/24/2015 FINAL 04/23/2015 2345    Cardiac Enzymes: No results for input(s): CKTOTAL, CKMB, CKMBINDEX, TROPONINI in the last 168 hours. CBG:  Recent Labs Lab 05/01/15 0719 05/01/15 1118 05/01/15 1729 05/01/15 2018 05/02/15 0758  GLUCAP 165* 144* 219* 289* 103*   Iron Studies: No results for input(s): IRON, TIBC, TRANSFERRIN, FERRITIN in the last 72 hours. @lablastinr3 @ Studies/Results: No results found. Medications: . sodium chloride 10 mL/hr at 04/30/15 0908  . sodium chloride 10 mL/hr at 04/30/15 1138   . allopurinol  100 mg Oral Daily  . aspirin  325 mg Oral Daily  . calcium carbonate  800 mg of elemental calcium Oral TID WC  . cefTAZidime (FORTAZ)  IV  500 mg Intravenous Q24H  . dialysis solution 1.5% low-MG/low-CA   Intraperitoneal 5 X Daily  . dialysis solution 2.5% low-MG/low-CA   Intraperitoneal 5 X Daily  . heparin  5,000 Units Subcutaneous 3 times per day  . insulin aspart  0-9 Units Subcutaneous TID WC  . insulin aspart  3 Units Subcutaneous TID WC  . insulin glargine  40 Units Subcutaneous QHS  . levothyroxine  112 mcg Oral QAC breakfast  . metoCLOPramide  10 mg Oral TID AC & HS  . midodrine  10 mg Oral TID  . multivitamin  1 tablet Oral QHS  . pantoprazole  40 mg Oral Q1200  . potassium chloride  30 mEq Oral BID  . sodium chloride  3 mL Intravenous Q12H  . sodium chloride  3 mL Intravenous Q12H  . sodium chloride  3 mL Intravenous Q12H    I have seen and examined this patient and agree with plan as outlined by Juanell Fairly, NP.  Pt to be discharged to home today and will continue with his CAPD as an outpatient. Hodan Wurtz A,MD 05/02/2015 12:45 PM

## 2015-05-02 NOTE — Discharge Instructions (Signed)
Follow with [name], MD in 5-7 days  Please get a complete blood count and chemistry panel checked by your Primary MD at your next visit, and again as instructed by your Primary MD. Please get your medications reviewed and adjusted by your Primary MD.  Please request your Primary MD to go over all Hospital Tests and Procedure/Radiological results at the follow up, please get all Hospital records sent to your Prim MD by signing hospital release before you go home.  If you had Pneumonia of Lung problems at the Hospital: Please get a 2 view Chest X ray done in 6-8 weeks after hospital discharge or sooner if instructed by your Primary MD.  If you have Congestive Heart Failure: Please call your Cardiologist or Primary MD anytime you have any of the following symptoms:  1) 3 pound weight gain in 24 hours or 5 pounds in 1 week  2) shortness of breath, with or without a dry hacking cough  3) swelling in the hands, feet or stomach  4) if you have to sleep on extra pillows at night in order to breathe  Follow cardiac low salt diet and 1.5 lit/day fluid restriction.  If you have diabetes Accuchecks 4 times/day, Once in AM empty stomach and then before each meal. Log in all results and show them to your primary doctor at your next visit. If any glucose reading is under 80 or above 300 call your primary MD immediately.  If you have Seizure/Convulsions/Epilepsy: Please do not drive, operate heavy machinery, participate in activities at heights or participate in high speed sports until you have seen by Primary MD or a Neurologist and advised to do so again.  If you had Gastrointestinal Bleeding: Please ask your Primary MD to check a complete blood count within one week of discharge or at your next visit. Your endoscopic/colonoscopic biopsies that are pending at the time of discharge, will also need to followed by your Primary MD.  Get Medicines reviewed and adjusted. Please take all your  medications with you for your next visit with your Primary MD  Please request your Primary MD to go over all hospital tests and procedure/radiological results at the follow up, please ask your Primary MD to get all Hospital records sent to his/her office.  If you experience worsening of your admission symptoms, develop shortness of breath, life threatening emergency, suicidal or homicidal thoughts you must seek medical attention immediately by calling 911 or calling your MD immediately  if symptoms less severe.  You must read complete instructions/literature along with all the possible adverse reactions/side effects for all the Medicines you take and that have been prescribed to you. Take any new Medicines after you have completely understood and accpet all the possible adverse reactions/side effects.   Do not drive or operate heavy machinery when taking Pain medications.   Do not take more than prescribed Pain, Sleep and Anxiety Medications  Special Instructions: If you have smoked or chewed Tobacco  in the last 2 yrs please stop smoking, stop any regular Alcohol  and or any Recreational drug use.  Wear Seat belts while driving.  Please note You were cared for by a hospitalist during your hospital stay. If you have any questions about your discharge medications or the care you received while you were in the hospital after you are discharged, you can call the unit and asked to speak with the hospitalist on call if the hospitalist that took care of you is not available. Once you  are discharged, your primary care physician will handle any further medical issues. Please note that NO REFILLS for any discharge medications will be authorized once you are discharged, as it is imperative that you return to your primary care physician (or establish a relationship with a primary care physician if you do not have one) for your aftercare needs so that they can reassess your need for medications and monitor your  lab values.  You can reach the hospitalist office at phone [phone] or fax [phone]   If you do not have a primary care physician, you can call [phone] for a physician referral.  Activity: As tolerated with Full fall precautions use walker/cane & assistance as needed  Diet: diabetic, renal  Disposition Home

## 2015-05-02 NOTE — Progress Notes (Signed)
Physical Therapy Treatment Patient Details Name: Jack Chung MRN: [id number] DOB: 1941-01-16 Today's Date: 05/02/2015    History of Present Illness 74 y.o. male admitted with intractable vomiting/nausea and gangrene of Lt great toe, s/p 1st Ray Amputation Left Foot    PT Comments    Effortful but capable of safely transferring to/from bed and chiar with a rolling walker and no physical assistance. Plans to transfer only while non-weight bearing through LLE at home due to decreased strength as he is unable to ambulate safely. Patient will continue to benefit from skilled physical therapy services at home with HHPT to further improve independence with functional mobility.   Follow Up Recommendations  Home health PT     Equipment Recommendations  3in1 (PT)    Recommendations for Other Services OT consult     Precautions / Restrictions Precautions Precautions: Fall Required Braces or Orthoses: Other Brace/Splint (post-op shoe) Other Brace/Splint: post-op shoe Lt Restrictions Weight Bearing Restrictions: Yes LLE Weight Bearing: Non weight bearing    Mobility  Bed Mobility Overal bed mobility: Modified Independent             General bed mobility comments: extra time  Transfers Overall transfer level: Needs assistance Equipment used: Rolling walker (2 wheeled) Transfers: Sit to/from Stand Sit to Stand: Min guard Stand pivot transfers: Min guard       General transfer comment: Close guard for safety. Requires quite a bit of effort to rise but was eventually able to perform without physical assistance. Performed from bed and recliner. VC for hand placement and anterior weight shift. [place] for [employer]. Cues to maintain NWB through LLE. No buckling. Good RW control and balance once upright.  Ambulation/Gait             General Gait Details: cannot tolerate due to decreased strength and NWB status on LLE   Stairs            Wheelchair Mobility     Modified Rankin (Stroke Patients Only)       Balance                                    Cognition Arousal/Alertness: Awake/alert Behavior During Therapy: WFL for tasks assessed/performed Overall Cognitive Status: Within Functional Limits for tasks assessed                      Exercises General Exercises - Lower Extremity Ankle Circles/Pumps: AROM;Both;10 reps;Seated (very limited Lt ankle ROM) Short Arc Quad: Strengthening;Both;10 reps;Supine Hip ABduction/ADduction: Strengthening;Both;10 reps;Seated    General Comments General comments (skin integrity, edema, etc.): Pt will be essentially transferring to and from his powerchair until able to weight-bear through LLE per surgeons protocol. Encouraged to have family assist pt in/out of car and in home for the first few days for safety.      Pertinent Vitals/Pain Pain Assessment: No/denies pain    Home Living                      Prior Function            PT Goals (current goals can now be found in the care plan section) Acute Rehab PT Goals Patient Stated Goal: Go home soon PT Goal Formulation: With patient Time For Goal Achievement: 05/14/15 Potential to Achieve Goals: Good Progress towards PT goals: Progressing toward goals    Frequency  Min 3X/week  PT Plan Current plan remains appropriate    Co-evaluation             End of Session Equipment Utilized During Treatment: Gait belt Activity Tolerance: Patient tolerated treatment well Patient left: in chair;with call bell/phone within reach;with family/visitor present     Time: [id number] PT Time Calculation (min) (ACUTE ONLY): 14 min  Charges:  $Therapeutic Activity: 8-22 mins                    G Codes:      Jack Chung 05/11/15, 11:35 AM  Jack Chung, [place]

## 2015-05-05 ENCOUNTER — Encounter (HOSPITAL_COMMUNITY): Payer: Self-pay | Admitting: Orthopedic Surgery

## 2015-05-18 ENCOUNTER — Inpatient Hospital Stay (HOSPITAL_COMMUNITY)
Admission: EM | Admit: 2015-05-18 | Discharge: 2015-05-21 | DRG: 871 | Disposition: A | Discharge status: Home Health | Payer: Medicare Other | Attending: Internal Medicine | Admitting: Internal Medicine

## 2015-05-18 ENCOUNTER — Encounter (HOSPITAL_COMMUNITY): Payer: Self-pay | Admitting: *Deleted

## 2015-05-18 ENCOUNTER — Emergency Department (HOSPITAL_COMMUNITY): Payer: Medicare Other

## 2015-05-18 ENCOUNTER — Inpatient Hospital Stay (HOSPITAL_COMMUNITY): Payer: Medicare Other

## 2015-05-18 DIAGNOSIS — K920 Hematemesis: Secondary | ICD-10-CM | POA: Diagnosis present

## 2015-05-18 DIAGNOSIS — E876 Hypokalemia: Secondary | ICD-10-CM | POA: Diagnosis present

## 2015-05-18 DIAGNOSIS — E039 Hypothyroidism, unspecified: Secondary | ICD-10-CM | POA: Diagnosis present

## 2015-05-18 DIAGNOSIS — E669 Obesity, unspecified: Secondary | ICD-10-CM | POA: Diagnosis present

## 2015-05-18 DIAGNOSIS — I120 Hypertensive chronic kidney disease with stage 5 chronic kidney disease or end stage renal disease: Secondary | ICD-10-CM | POA: Diagnosis present

## 2015-05-18 DIAGNOSIS — E872 Acidosis, unspecified: Secondary | ICD-10-CM

## 2015-05-18 DIAGNOSIS — Z7982 Long term (current) use of aspirin: Secondary | ICD-10-CM | POA: Diagnosis not present

## 2015-05-18 DIAGNOSIS — D869 Sarcoidosis, unspecified: Secondary | ICD-10-CM | POA: Diagnosis present

## 2015-05-18 DIAGNOSIS — Z992 Dependence on renal dialysis: Secondary | ICD-10-CM

## 2015-05-18 DIAGNOSIS — D62 Acute posthemorrhagic anemia: Secondary | ICD-10-CM | POA: Diagnosis present

## 2015-05-18 DIAGNOSIS — B3781 Candidal esophagitis: Secondary | ICD-10-CM | POA: Diagnosis present

## 2015-05-18 DIAGNOSIS — Z794 Long term (current) use of insulin: Secondary | ICD-10-CM | POA: Diagnosis not present

## 2015-05-18 DIAGNOSIS — I2510 Atherosclerotic heart disease of native coronary artery without angina pectoris: Secondary | ICD-10-CM | POA: Diagnosis present

## 2015-05-18 DIAGNOSIS — I70209 Unspecified atherosclerosis of native arteries of extremities, unspecified extremity: Secondary | ICD-10-CM | POA: Diagnosis present

## 2015-05-18 DIAGNOSIS — E1122 Type 2 diabetes mellitus with diabetic chronic kidney disease: Secondary | ICD-10-CM | POA: Diagnosis present

## 2015-05-18 DIAGNOSIS — R1310 Dysphagia, unspecified: Secondary | ICD-10-CM | POA: Diagnosis present

## 2015-05-18 DIAGNOSIS — Z79899 Other long term (current) drug therapy: Secondary | ICD-10-CM | POA: Diagnosis not present

## 2015-05-18 DIAGNOSIS — E11319 Type 2 diabetes mellitus with unspecified diabetic retinopathy without macular edema: Secondary | ICD-10-CM | POA: Diagnosis present

## 2015-05-18 DIAGNOSIS — Z951 Presence of aortocoronary bypass graft: Secondary | ICD-10-CM | POA: Diagnosis not present

## 2015-05-18 DIAGNOSIS — M1000 Idiopathic gout, unspecified site: Secondary | ICD-10-CM | POA: Diagnosis present

## 2015-05-18 DIAGNOSIS — E1152 Type 2 diabetes mellitus with diabetic peripheral angiopathy with gangrene: Secondary | ICD-10-CM | POA: Diagnosis present

## 2015-05-18 DIAGNOSIS — K3184 Gastroparesis: Secondary | ICD-10-CM | POA: Diagnosis present

## 2015-05-18 DIAGNOSIS — I25810 Atherosclerosis of coronary artery bypass graft(s) without angina pectoris: Secondary | ICD-10-CM | POA: Diagnosis not present

## 2015-05-18 DIAGNOSIS — A419 Sepsis, unspecified organism: Secondary | ICD-10-CM | POA: Diagnosis present

## 2015-05-18 DIAGNOSIS — E1143 Type 2 diabetes mellitus with diabetic autonomic (poly)neuropathy: Secondary | ICD-10-CM | POA: Diagnosis present

## 2015-05-18 DIAGNOSIS — I509 Heart failure, unspecified: Secondary | ICD-10-CM | POA: Diagnosis present

## 2015-05-18 DIAGNOSIS — L089 Local infection of the skin and subcutaneous tissue, unspecified: Secondary | ICD-10-CM | POA: Diagnosis present

## 2015-05-18 DIAGNOSIS — Z89419 Acquired absence of unspecified great toe: Secondary | ICD-10-CM

## 2015-05-18 DIAGNOSIS — Z6834 Body mass index (BMI) 34.0-34.9, adult: Secondary | ICD-10-CM | POA: Diagnosis not present

## 2015-05-18 DIAGNOSIS — K5900 Constipation, unspecified: Secondary | ICD-10-CM | POA: Diagnosis present

## 2015-05-18 DIAGNOSIS — E11628 Type 2 diabetes mellitus with other skin complications: Secondary | ICD-10-CM | POA: Diagnosis present

## 2015-05-18 DIAGNOSIS — E1121 Type 2 diabetes mellitus with diabetic nephropathy: Secondary | ICD-10-CM | POA: Diagnosis not present

## 2015-05-18 DIAGNOSIS — N186 End stage renal disease: Secondary | ICD-10-CM | POA: Diagnosis present

## 2015-05-18 DIAGNOSIS — R109 Unspecified abdominal pain: Secondary | ICD-10-CM

## 2015-05-18 DIAGNOSIS — K2960 Other gastritis without bleeding: Secondary | ICD-10-CM | POA: Diagnosis present

## 2015-05-18 DIAGNOSIS — E1129 Type 2 diabetes mellitus with other diabetic kidney complication: Secondary | ICD-10-CM | POA: Diagnosis present

## 2015-05-18 DIAGNOSIS — I9589 Other hypotension: Secondary | ICD-10-CM | POA: Diagnosis present

## 2015-05-18 DIAGNOSIS — E1169 Type 2 diabetes mellitus with other specified complication: Secondary | ICD-10-CM | POA: Diagnosis not present

## 2015-05-18 DIAGNOSIS — E785 Hyperlipidemia, unspecified: Secondary | ICD-10-CM | POA: Diagnosis present

## 2015-05-18 DIAGNOSIS — I441 Atrioventricular block, second degree: Secondary | ICD-10-CM | POA: Diagnosis not present

## 2015-05-18 LAB — CBG MONITORING, ED: GLUCOSE-CAPILLARY: 238 mg/dL — AB (ref 65–99)

## 2015-05-18 LAB — HEMOGLOBIN AND HEMATOCRIT, BLOOD
HEMATOCRIT: 41.7 % (ref 39.0–52.0)
Hemoglobin: 13.8 g/dL (ref 13.0–17.0)

## 2015-05-18 LAB — COMPREHENSIVE METABOLIC PANEL
ALT: 18 U/L (ref 17–63)
AST: 38 U/L (ref 15–41)
Albumin: 2.6 g/dL — ABNORMAL LOW (ref 3.5–5.0)
Alkaline Phosphatase: 155 U/L — ABNORMAL HIGH (ref 38–126)
Anion gap: 16 — ABNORMAL HIGH (ref 5–15)
BUN: 38 mg/dL — ABNORMAL HIGH (ref 6–20)
CHLORIDE: 91 mmol/L — AB (ref 101–111)
CO2: 23 mmol/L (ref 22–32)
CREATININE: 6.59 mg/dL — AB (ref 0.61–1.24)
Calcium: 9.5 mg/dL (ref 8.9–10.3)
GFR, EST AFRICAN AMERICAN: 9 mL/min — AB (ref >60)
GFR, EST NON AFRICAN AMERICAN: 7 mL/min — AB (ref >60)
Glucose, Bld: 303 mg/dL — ABNORMAL HIGH (ref 65–99)
POTASSIUM: 4.4 mmol/L (ref 3.5–5.1)
SODIUM: 130 mmol/L — AB (ref 135–145)
Total Bilirubin: 0.5 mg/dL (ref 0.3–1.2)
Total Protein: 7.4 g/dL (ref 6.5–8.1)

## 2015-05-18 LAB — CBC WITH DIFFERENTIAL/PLATELET
BASOS ABS: 0.1 10*3/uL (ref 0.0–0.1)
BASOS PCT: 1 %
EOS ABS: 0.1 10*3/uL (ref 0.0–0.7)
Eosinophils Relative: 1 %
HCT: 44.3 % (ref 39.0–52.0)
HEMOGLOBIN: 14.3 g/dL (ref 13.0–17.0)
LYMPHS ABS: 1.6 10*3/uL (ref 0.7–4.0)
Lymphocytes Relative: 16 %
MCH: 32.7 pg (ref 26.0–34.0)
MCHC: 32.3 g/dL (ref 30.0–36.0)
MCV: 101.4 fL — ABNORMAL HIGH (ref 78.0–100.0)
Monocytes Absolute: 0.7 10*3/uL (ref 0.1–1.0)
Monocytes Relative: 7 %
NEUTROS PCT: 75 %
Neutro Abs: 7.3 10*3/uL (ref 1.7–7.7)
Platelets: 365 10*3/uL (ref 150–400)
RBC: 4.37 MIL/uL (ref 4.22–5.81)
RDW: 14.8 % (ref 11.5–15.5)
WBC: 9.7 10*3/uL (ref 4.0–10.5)

## 2015-05-18 LAB — I-STAT CG4 LACTIC ACID, ED
Lactic Acid, Venous: 5.58 mmol/L (ref 0.5–2.0)
Lactic Acid, Venous: 4.19 mmol/L (ref 0.5–2.0)

## 2015-05-18 LAB — LACTIC ACID, PLASMA
LACTIC ACID, VENOUS: 3.5 mmol/L — AB (ref 0.5–2.0)
Lactic Acid, Venous: 4.1 mmol/L (ref 0.5–2.0)

## 2015-05-18 LAB — I-STAT TROPONIN, ED: TROPONIN I, POC: 0.04 ng/mL (ref 0.00–0.08)

## 2015-05-18 LAB — BRAIN NATRIURETIC PEPTIDE: B NATRIURETIC PEPTIDE 5: 434.4 pg/mL — AB (ref 0.0–100.0)

## 2015-05-18 LAB — GLUCOSE, CAPILLARY
Glucose-Capillary: 276 mg/dL — ABNORMAL HIGH (ref 65–99)
Glucose-Capillary: 188 mg/dL — ABNORMAL HIGH (ref 65–99)

## 2015-05-18 MED ORDER — DELFLEX-LC/1.5% DEXTROSE 346 MOSM/L IP SOLN
Freq: Every day | INTRAPERITONEAL | Status: DC
  Administered 2015-05-18: 16:00:00 via INTRAPERITONEAL

## 2015-05-18 MED ORDER — LACTULOSE 10 GM/15ML PO SOLN: 10.0000 g | ORAL | Status: DC | PRN

## 2015-05-18 MED ORDER — VANCOMYCIN HCL IN DEXTROSE 1-5 GM/200ML-% IV SOLN
1000.0000 mg | Freq: Once | INTRAVENOUS | Status: AC
  Administered 2015-05-18: 1000 mg via INTRAVENOUS
  Filled 2015-05-18: qty 200

## 2015-05-18 MED ORDER — METOCLOPRAMIDE HCL 5 MG/ML IJ SOLN
5.0000 mg | Freq: Three times a day (TID) | INTRAMUSCULAR | Status: DC
  Administered 2015-05-18 – 2015-05-19 (×3): 5 mg via INTRAVENOUS
  Filled 2015-05-18 (×3): qty 2

## 2015-05-18 MED ORDER — PANTOPRAZOLE SODIUM 40 MG IV SOLR
40.0000 mg | Freq: Two times a day (BID) | INTRAVENOUS | Status: DC
  Administered 2015-05-18 – 2015-05-21 (×7): 40 mg via INTRAVENOUS
  Filled 2015-05-18 (×7): qty 40

## 2015-05-18 MED ORDER — HEPARIN 1000 UNIT/ML FOR PERITONEAL DIALYSIS
1500.0000 [IU] | INTRAMUSCULAR | Status: DC | PRN
  Filled 2015-05-18: qty 1.5

## 2015-05-18 MED ORDER — SODIUM CHLORIDE 0.9 % IV BOLUS (SEPSIS)
3500.0000 mL | Freq: Once | INTRAVENOUS | Status: AC
  Administered 2015-05-18: 2000 mL via INTRAVENOUS

## 2015-05-18 MED ORDER — ONDANSETRON HCL 4 MG/2ML IJ SOLN
4.0000 mg | Freq: Four times a day (QID) | INTRAMUSCULAR | Status: DC | PRN
  Administered 2015-05-18 – 2015-05-19 (×2): 4 mg via INTRAVENOUS
  Filled 2015-05-18 (×2): qty 2

## 2015-05-18 MED ORDER — ALLOPURINOL 100 MG PO TABS
100.0000 mg | ORAL_TABLET | Freq: Every day | ORAL | Status: DC
  Administered 2015-05-18 – 2015-05-21 (×4): 100 mg via ORAL
  Filled 2015-05-18 (×4): qty 1

## 2015-05-18 MED ORDER — ACETAMINOPHEN 325 MG PO TABS: 650.0000 mg | ORAL_TABLET | Freq: Four times a day (QID) | ORAL | Status: DC | PRN

## 2015-05-18 MED ORDER — OXYCODONE HCL 5 MG PO TABS: 5.0000 mg | ORAL_TABLET | ORAL | Status: DC | PRN

## 2015-05-18 MED ORDER — SODIUM CHLORIDE 0.9 % IV BOLUS (SEPSIS)
500.0000 mL | Freq: Once | INTRAVENOUS | Status: AC
  Administered 2015-05-18: 500 mL via INTRAVENOUS

## 2015-05-18 MED ORDER — PIPERACILLIN-TAZOBACTAM 3.375 G IVPB 30 MIN
3.3750 g | Freq: Once | INTRAVENOUS | Status: AC
  Administered 2015-05-18: 3.375 g via INTRAVENOUS
  Filled 2015-05-18: qty 50

## 2015-05-18 MED ORDER — PIPERACILLIN-TAZOBACTAM IN DEX 2-0.25 GM/50ML IV SOLN
2.2500 g | Freq: Three times a day (TID) | INTRAVENOUS | Status: DC
  Administered 2015-05-18 – 2015-05-21 (×9): 2.25 g via INTRAVENOUS
  Filled 2015-05-18 (×12): qty 50

## 2015-05-18 MED ORDER — SODIUM CHLORIDE 0.9 % IV SOLN: INTRAVENOUS | Status: DC

## 2015-05-18 MED ORDER — FENTANYL CITRATE (PF) 100 MCG/2ML IJ SOLN: 12.5000 ug | INTRAMUSCULAR | Status: DC | PRN

## 2015-05-18 MED ORDER — INSULIN ASPART 100 UNIT/ML ~~LOC~~ SOLN
0.0000 [IU] | Freq: Three times a day (TID) | SUBCUTANEOUS | Status: DC
  Administered 2015-05-18: 5 [IU] via SUBCUTANEOUS
  Administered 2015-05-19: 3 [IU] via SUBCUTANEOUS
  Administered 2015-05-19: 2 [IU] via SUBCUTANEOUS
  Administered 2015-05-19: 1 [IU] via SUBCUTANEOUS
  Administered 2015-05-20: 2 [IU] via SUBCUTANEOUS
  Administered 2015-05-20: 1 [IU] via SUBCUTANEOUS
  Administered 2015-05-20: 2 [IU] via SUBCUTANEOUS
  Administered 2015-05-21: 3 [IU] via SUBCUTANEOUS
  Administered 2015-05-21: 2 [IU] via SUBCUTANEOUS

## 2015-05-18 MED ORDER — MIDODRINE HCL 5 MG PO TABS
10.0000 mg | ORAL_TABLET | Freq: Three times a day (TID) | ORAL | Status: DC
  Administered 2015-05-18 – 2015-05-21 (×8): 10 mg via ORAL
  Filled 2015-05-18 (×8): qty 2

## 2015-05-18 MED ORDER — HEPARIN 1000 UNIT/ML FOR PERITONEAL DIALYSIS
1500.0000 [IU] | INTRAMUSCULAR | Status: DC | PRN
  Administered 2015-05-19: 1500 [IU] via INTRAPERITONEAL
  Filled 2015-05-18 (×5): qty 1.5

## 2015-05-18 MED ORDER — HEPARIN 1000 UNIT/ML FOR PERITONEAL DIALYSIS: 500.0000 [IU] | INTRAMUSCULAR | Status: DC | PRN

## 2015-05-18 MED ORDER — ONDANSETRON HCL 4 MG PO TABS: 4.0000 mg | ORAL_TABLET | Freq: Four times a day (QID) | ORAL | Status: DC | PRN

## 2015-05-18 MED ORDER — SODIUM CHLORIDE 0.9 % IV SOLN
1250.0000 mg | Freq: Once | INTRAVENOUS | Status: AC
  Administered 2015-05-18: 1250 mg via INTRAVENOUS
  Filled 2015-05-18: qty 1250

## 2015-05-18 MED ORDER — MORPHINE SULFATE (PF) 2 MG/ML IV SOLN
1.0000 mg | INTRAVENOUS | Status: DC | PRN
  Administered 2015-05-18: 1 mg via INTRAVENOUS
  Filled 2015-05-18: qty 1

## 2015-05-18 MED ORDER — LEVOTHYROXINE SODIUM 112 MCG PO TABS
112.0000 ug | ORAL_TABLET | Freq: Every day | ORAL | Status: DC
  Administered 2015-05-21: 112 ug via ORAL
  Filled 2015-05-18 (×2): qty 1

## 2015-05-18 MED ORDER — ACETAMINOPHEN 650 MG RE SUPP: 650.0000 mg | Freq: Four times a day (QID) | RECTAL | Status: DC | PRN

## 2015-05-18 NOTE — Progress Notes (Addendum)
Colin Ina RN Spoke with RN and Vernie Shanks PA regarding need for Nephrology to approve PICC placement due CKD hx and graft in arm.

## 2015-05-18 NOTE — ED Notes (Signed)
Attempted report 

## 2015-05-18 NOTE — ED Notes (Signed)
IV attempted x2 to start second IV, unsuccessful, Abigail PA speaking with intensivist and  Nephrology for PICC line, pt has hx of very difficult IV stick, pt at this time has 18 in the upper arm with all antibiotics and fluids running through it. At this time IV is patent.

## 2015-05-18 NOTE — ED Provider Notes (Addendum)
CSN: [id number]     Arrival date & time 05/18/15  1032 History   First MD Initiated Contact with Patient 05/18/15 1032     Chief Complaint  Patient presents with  . Weakness     (Consider location/radiation/quality/duration/timing/severity/associated sxs/prior Treatment) The history is provided by the patient. No language interpreter was used.     Jack Chung is a(n) 74 y.o. male who presents to the ED with cc of SOB, Vomiting, And weakness.  He has am pmh of HTN, ESRD on home peritoneal dialysis, DM. Sarcoidosis, PVD. He is S/P Left 1st Ray amputation of the foot by DR. DUDA on 04/30/2015. He states that  Over the past 3 months has had had difficulty swallowing and vomiting. He feels as though solids and thick liquids are getting stuck in his chest and he vomits. Over the past 3 days it has worsened and he is now unable to hold even thin liquids. He has had 4-5 episodes of daily vomiting. He feels SOB and has shaking chills. He was seen By wound care  in follow up and was placed on cipro but has been unable to hold down the medication. Denies fevers, chills, myalgias, arthralgias. Denies , chest tightness or pressure, radiation to left arm, jaw or back, or diaphoresis.  Denies headaches, light headedness, , visual disturbances. Denies abdominal pain,diarrhea or constipation.    Past Medical History  Diagnosis Date  . Hypertension   . Diabetes mellitus   . Hypothyroidism   . CHF (congestive heart failure) ([place])   . Anemia   . Blind left eye   . Sleep apnea     not on CPAP  . Coronary artery disease     CABG x 11 Jun 2009.  MRSA infections of incsions  . Arthritis   . Sarcoidosis ([place])     Hx:of  . Diabetic retinopathy ([place])     Hx: of bilateral  . Edema   . Hyperlipidemia   . 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 Atherosclerosis of native arteries of the extremities, unspecified   . Eczema     Hx: of  . Insomnia   . Seasonal allergies   . ESRD on  dialysis [facility])     Was on dialysis in July 2011 and then stopped and restarted May 2014. He then transitioned to PD in Aug 2014. He does PD on cycler when at home and CAPD 6 exchanges per day when travelling. His wife is "in charge" and does the exchanges.   . History of blood transfusion     with heart surgery  . Coronary atherosclerosis of native coronary artery   . Atherosclerosis of native arteries of the extremities, unspecified    Past Surgical History  Procedure Laterality Date  . Cardiac surgery      total of 6 surgeries, 5 related to mrsa  . Coronary artery bypass graft  06/2009  . Cataract surgery      Hx: of  . Debridements      Hx: of secondary to MRSA  . Av fistula placement Right 10/11/2012    Procedure: ARTERIOVENOUS (AV) FISTULA CREATION;  Surgeon: Rosetta Posner, MD;  Location: [facility];  Service: Vascular;  Laterality: Right;  . Insertion of dialysis catheter Left   . Capd insertion N/A 11/28/2012    Procedure: LAPAROSCOPIC PERITONEAL DIALYSIS CATHETER PLACEMENT;  Surgeon: Ralene Ok, MD;  Location: [place];  Service: General;  Laterality: N/A;  . Left heart catheterization  with coronary/graft angiogram N/A 01/31/2014    Procedure: LEFT HEART CATHETERIZATION WITH Beatrix Fetters;  Surgeon: Troy Sine, MD;  Location: [facility] CATH LAB;  Service: Cardiovascular;  Laterality: N/A;  . Peripheral vascular catheterization N/A 04/28/2015    Procedure: Abdominal Aortogram w/Lower Extremity;  Surgeon: Conrad Wykoff, MD;  Location: [facility] CV LAB;  Service: Cardiovascular;  Laterality: N/A;  . Amputation Left 04/30/2015    Procedure: 1st Ray Amputation Left Foot;  Surgeon: Newt Minion, MD;  Location: [facility];  Service: Orthopedics;  Laterality: Left;   Family History  Problem Relation Age of Onset  . COPD Mother   . COPD Father    Social History  Substance Use Topics  . Smoking status: Never Smoker   . Smokeless tobacco: Never Used  . Alcohol Use: No    Review of  Systems  Ten systems reviewed and are negative for acute change, except as noted in the HPI.    Allergies  Flu virus vaccine and Lipitor  Home Medications   Prior to Admission medications   Medication Sig Start Date End Date Taking? Authorizing Provider  allopurinol (ZYLOPRIM) 100 MG tablet Take 100 mg by mouth daily.    Historical Provider, MD  aspirin 325 MG tablet Take 325 mg by mouth daily.    Historical Provider, MD  calcium carbonate (TUMS - DOSED IN MG ELEMENTAL CALCIUM) 500 MG chewable tablet Chew 2 tablets by mouth 3 (three) times daily with meals.     Historical Provider, MD  cetirizine (ZYRTEC) 10 MG tablet Take 10 mg by mouth daily.    Historical Provider, MD  doxycycline (VIBRA-TABS) 100 MG tablet Take 1 tablet (100 mg total) by mouth 2 (two) times daily. 05/02/15   Costin Karlyne Greenspan, MD  insulin aspart (NOVOLOG) 100 UNIT/ML injection Inject 10-15 Units into the skin 3 (three) times daily with meals. Take none if blood sugar < 150, 151-200 2 units, 201-250 4 units, 251-300 6 units, 301-350 8 units    Historical Provider, MD  Insulin Glargine (LANTUS) 100 UNIT/ML Solostar Pen Inject 45 units into the skin daily. Dx 250.00 Patient taking differently: 40 Units by Subconjunctival route at bedtime.  07/26/13   Lauree Chandler, NP  lactulose (CHRONULAC) 10 GM/15ML solution Take 10 g by mouth every 4 (four) hours as needed for mild constipation.    Historical Provider, MD  levothyroxine (SYNTHROID, LEVOTHROID) 112 MCG tablet LABS/APPOINTMENT OVERDUE, 1 by mouth daily in the morning 30 minutes before breakfast for Thyroid 12/10/13   Tiffany L Reed, DO  loperamide (IMODIUM) 2 MG capsule Take 2 mg by mouth daily as needed for diarrhea or loose stools.    Historical Provider, MD  metoCLOPramide (REGLAN) 5 MG tablet Take 2 tablets (10 mg total) by mouth 3 (three) times daily before meals. 05/02/15   Costin Karlyne Greenspan, MD  midodrine (PROAMATINE) 10 MG tablet Take 1 tablet (10 mg total) by mouth  3 (three) times daily. 03/11/15   Pixie Casino, MD  Multiple Vitamin (MULTI VITAMIN DAILY PO) Take 1 tablet by mouth daily.    Historical Provider, MD  omeprazole (PRILOSEC) 10 MG capsule Take 10 mg by mouth daily.    Historical Provider, MD  oxyCODONE-acetaminophen (PERCOCET/ROXICET) 5-325 MG tablet Take 1-2 tablets by mouth every 6 (six) hours as needed for moderate pain. 05/02/15   Willards, MD  PERITONEAL DIALYSIS SOLUTIONS IP Inject 2,500 Units into the peritoneum 4 (four) times daily.     Historical  Provider, MD  potassium chloride SA (K-DUR,KLOR-CON) 20 MEQ tablet Take 20 mEq by mouth daily.    Historical Provider, MD  silver sulfADIAZINE (SILVADENE) 1 % cream Apply 1 application topically daily. 04/22/15   Trula Slade, DPM   BP 120/66 mmHg  Pulse 106  Temp(Src) 97.8 F (36.6 C) (Oral)  Resp 20  SpO2 99% Physical Exam  Constitutional: He appears well-developed and well-nourished. No distress.  Chronically ill appearing   HENT:  Head: Normocephalic and atraumatic.  Eyes: Conjunctivae are normal. No scleral icterus.  Neck: Normal range of motion. Neck supple.  Cardiovascular: Normal rate, regular rhythm and normal heart sounds.   Pulmonary/Chest: Effort normal and breath sounds normal. No respiratory distress.  Abdominal: Soft. There is no tenderness.  Musculoskeletal: He exhibits no edema.  Left foot with Necrotic region along the dorsum. No TTP.   Neurological: He is alert.  Skin: Skin is warm and dry. He is not diaphoretic.  Psychiatric: His behavior is normal.  Nursing note and vitals reviewed.           ED Course  Procedures (including critical care time) Labs Review Labs Reviewed - No data to display  Imaging Review No results found. I have personally reviewed and evaluated these images and lab results as part of my medical decision-making.   EKG Interpretation None      MDM   Final diagnoses:  Left foot infection  ESRD on  peritoneal dialysis (HCC)  Lactic acid acidosis    3:06 PM BP 136/86 mmHg  Pulse 111  Temp(Src) 97.7 F (36.5 C) (Rectal)  Resp 25  SpO2 100% Patient is ill appearing. Patient seen in shared visit with attending physician. I have ordered a sepsis work up.    Patient with elevated lactate,  Code sepsis initiated. I have orded vanc and zosyn; Fluid bolus. [name] has consulted on the patient in the ED.   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 DR. Tyrell Antonio will admit the patient. [name] will consult. Pressure improving after fluids.   1:51 PM BP 109/67 mmHg  Pulse 97  Temp(Src) 97.7 F (36.5 C) (Rectal)  Resp 25  SpO2 100% Sepsis - Repeat Assessment  Performed at:    1:51 PM   Vitals     Blood pressure 109/67, pulse 97, temperature 97.7 F (36.5 C), temperature source Rectal, resp. rate 25, SpO2 100 %.  Heart:     Regular rate and rhythm  Lungs:    CTA  Capillary Refill:   <2 sec IN UPPER EXTREMITIES (PVD IN LEs)  Peripheral Pulse:   Radial pulse palpable  Skin:     Normal Color  Patient lactate is trending down. To 4.19 Patient will be admitted to the [place], PA-C 05/18/15 [place], MD 05/18/15 [address] PA-C 06/17/15 1109  Lajean Saver, MD 06/17/15 [phone]

## 2015-05-18 NOTE — Progress Notes (Signed)
CRITICAL VALUE ALERT  Critical value received:  Lactic Acid, Venous (4.1)  Date of notification:  05/18/2015  Time of notification:  1921  Critical value read back:Yes.    Nurse who received alert:  Rico Sheehan   MD notified (1st page):  Fredirick Maudlin  (NP) Text via amion  Time of first page:  2100  MD notified (2nd page):  Time of second page:  Responding MD:  Text via amion  Time MD responded:  Text via Shea Sowle

## 2015-05-18 NOTE — H&P (Signed)
[facility] History and Physical  Jack Chung [id number] DOB: 02/09/1941 DOA: 05/18/2015  Referring physician: Ashok Cordia PCP: [name], MD   Chief Complaint: Nausea and vomiting.   HPI: Jack Chung is a 75 y.o. 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 Who presents with recurrent nausea, vomiting worse over last 3 days. Patient has had nausea and vomiting, dysphagia to liquids and solids for last 3 moths , worse over last 3 days. His wife report coffee ground emesis today. He started to have epigastric pain in the ED when he sat up in the bed. Last BM 2 days ago. He has not being able to keep down antibiotics prescribe by Dr [name] for foot infection.  Evaluation in the ED; patient afebrile, tachypnea, lactic acid at 5, foot x ray with post surgical changes, no acte finding.    Review of Systems:  Negative, except as per HPI.   Past Medical History  Diagnosis Date  . Hypertension   . Diabetes mellitus   . Hypothyroidism   . CHF (congestive heart failure) ([place])   . Anemia   . Blind left eye   . Sleep apnea     not on CPAP  . Coronary artery disease     CABG x 11 Jun 2009.  MRSA infections of incsions  . Arthritis   . Sarcoidosis ([place])     Hx:of  . Diabetic retinopathy ([place])     Hx: of bilateral  . Edema   . Hyperlipidemia   . 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 Atherosclerosis of native arteries of the extremities, unspecified   . Eczema     Hx: of  . Insomnia   . Seasonal allergies   . ESRD on dialysis [facility])     Was on dialysis in July 2011 and then stopped and restarted May 2014. He then transitioned to PD in Aug 2014. He does PD on cycler when at home and CAPD 6 exchanges per day when travelling. His wife is "in charge" and does the exchanges.   . History of blood transfusion     with heart surgery  . Coronary atherosclerosis of native coronary artery   . Atherosclerosis of native  arteries of the extremities, unspecified    Past Surgical History  Procedure Laterality Date  . Cardiac surgery      total of 6 surgeries, 5 related to mrsa  . Coronary artery bypass graft  06/2009  . Cataract surgery      Hx: of  . Debridements      Hx: of secondary to MRSA  . Av fistula placement Right 10/11/2012    Procedure: ARTERIOVENOUS (AV) FISTULA CREATION;  Surgeon: Rosetta Posner, MD;  Location: [place];  Service: Vascular;  Laterality: Right;  . Insertion of dialysis catheter Left   . Capd insertion N/A 11/28/2012    Procedure: LAPAROSCOPIC PERITONEAL DIALYSIS CATHETER PLACEMENT;  Surgeon: Ralene Ok, MD;  Location: [place];  Service: General;  Laterality: N/A;  . Left heart catheterization with coronary/graft angiogram N/A 01/31/2014    Procedure: LEFT HEART CATHETERIZATION WITH Beatrix Fetters;  Surgeon: Troy Sine, MD;  Location: [facility] CATH LAB;  Service: Cardiovascular;  Laterality: N/A;  . Peripheral vascular catheterization N/A 04/28/2015    Procedure: Abdominal Aortogram w/Lower Extremity;  Surgeon: Conrad [place], MD;  Location: [place] CV LAB;  Service: Cardiovascular;  Laterality: N/A;  . Amputation Left 04/30/2015  Procedure: 1st Ray Amputation Left Foot;  Surgeon: Newt Minion, MD;  Location: [place];  Service: Orthopedics;  Laterality: Left;   Social History:  reports that he has never smoked. He has never used smokeless tobacco. He reports that he does not drink alcohol or use illicit drugs.  Allergies  Allergen Reactions  . Flu Virus Vaccine     Gets the flu, patient has bad reaction to the flu vaccine.  . Lipitor [Atorvastatin]     Leg cramps    Family History  Problem Relation Age of Onset  . COPD Mother   . COPD Father     Prior to Admission medications   Medication Sig Start Date End Date Taking? Authorizing Provider  allopurinol (ZYLOPRIM) 100 MG tablet Take 100 mg by mouth daily.   Yes Historical Provider, MD  aspirin 325 MG tablet  Take 325 mg by mouth daily.   Yes Historical Provider, MD  calcium carbonate (TUMS - DOSED IN MG ELEMENTAL CALCIUM) 500 MG chewable tablet Chew 2 tablets by mouth 3 (three) times daily with meals.    Yes Historical Provider, MD  cetirizine (ZYRTEC) 10 MG tablet Take 10 mg by mouth daily.   Yes Historical Provider, MD  doxycycline (VIBRA-TABS) 100 MG tablet Take 1 tablet (100 mg total) by mouth 2 (two) times daily. 05/02/15  Yes Costin Karlyne Greenspan, MD  gentamicin cream (GARAMYCIN) 0.1 % Apply 1 application topically at bedtime.   Yes Historical Provider, MD  insulin aspart (NOVOLOG) 100 UNIT/ML injection Inject 10-15 Units into the skin 3 (three) times daily with meals. Take none if blood sugar < 150, 151-200 2 units, 201-250 4 units, 251-300 6 units, 301-350 8 units   Yes Historical Provider, MD  Insulin Glargine (LANTUS) 100 UNIT/ML Solostar Pen Inject 45 units into the skin daily. Dx 250.00 Patient taking differently: 40 Units by Subconjunctival route at bedtime.  07/26/13  Yes Lauree Chandler, NP  lactulose (CHRONULAC) 10 GM/15ML solution Take 10 g by mouth every 4 (four) hours as needed for mild constipation.   Yes Historical Provider, MD  levothyroxine (SYNTHROID, LEVOTHROID) 112 MCG tablet LABS/APPOINTMENT OVERDUE, 1 by mouth daily in the morning 30 minutes before breakfast for Thyroid 12/10/13  Yes Tiffany L Reed, DO  loperamide (IMODIUM) 2 MG capsule Take 2 mg by mouth daily as needed for diarrhea or loose stools.   Yes Historical Provider, MD  metoCLOPramide (REGLAN) 5 MG tablet Take 2 tablets (10 mg total) by mouth 3 (three) times daily before meals. 05/02/15  Yes Costin Karlyne Greenspan, MD  midodrine (PROAMATINE) 10 MG tablet Take 1 tablet (10 mg total) by mouth 3 (three) times daily. 03/11/15  Yes Pixie Casino, MD  Multiple Vitamin (MULTI VITAMIN DAILY PO) Take 1 tablet by mouth daily.   Yes Historical Provider, MD  nitroGLYCERIN (NITRODUR - DOSED IN MG/24 HR) 0.2 mg/hr patch Place 0.2 mg onto the  skin daily.   Yes Historical Provider, MD  omeprazole (PRILOSEC) 10 MG capsule Take 10 mg by mouth daily.   Yes Historical Provider, MD  oxyCODONE-acetaminophen (PERCOCET/ROXICET) 5-325 MG tablet Take 1-2 tablets by mouth every 6 (six) hours as needed for moderate pain. 05/02/15  Yes [place], MD  PERITONEAL DIALYSIS SOLUTIONS IP Inject 3,000 Units into the peritoneum 5 (five) times daily.    Yes Historical Provider, MD  potassium chloride SA (K-DUR,KLOR-CON) 20 MEQ tablet Take 20 mEq by mouth daily.   Yes Historical Provider, MD   Physical Exam:  Filed Vitals:   05/18/15 1136 05/18/15 1200 05/18/15 1240 05/18/15 1300  BP:  101/67 127/72 130/55  Pulse:   97 96  Temp: 97.7 F (36.5 C)     TempSrc: Rectal     Resp:   23 34  SpO2:   100% 100%    Wt Readings from Last 3 Encounters:  05/01/15 107.548 kg (237 lb 1.6 oz)  03/11/15 107.911 kg (237 lb 14.4 oz)  02/06/15 108.183 kg (238 lb 8 oz)    General:  Appears calm and comfortable, mild pain Eyes: PERRL, normal lids, irises & conjunctiva ENT: grossly normal hearing, lips & tongue Neck: no LAD, masses or thyromegaly Cardiovascular: RRR, no m/r/g. No LE edema. Telemetry: SR, no arrhythmias  Respiratory: Normal respiratory effort. Bilateral ronchus Abdomen: soft, mild epigastric tenderness, peritoneal catheter in place.  Skin: left foot with necrotic area and redness prior surgery site.  Musculoskeletal: grossly normal tone BUE/BLE Psychiatric: grossly normal mood and affect, speech fluent and appropriate Neurologic: grossly non-focal.          Labs on Admission:  Basic Metabolic Panel:  Recent Labs Lab 05/18/15 1138  NA 130*  K 4.4  CL 91*  CO2 23  GLUCOSE 303*  BUN 38*  CREATININE 6.59*  CALCIUM 9.5   Liver Function Tests:  Recent Labs Lab 05/18/15 1138  AST 38  ALT 18  ALKPHOS 155*  BILITOT 0.5  PROT 7.4  ALBUMIN 2.6*   No results for input(s): LIPASE, AMYLASE in the last 168 hours. No results for  input(s): AMMONIA in the last 168 hours. CBC:  Recent Labs Lab 05/18/15 1138  WBC 9.7  NEUTROABS 7.3  HGB 14.3  HCT 44.3  MCV 101.4*  PLT 365   Cardiac Enzymes: No results for input(s): CKTOTAL, CKMB, CKMBINDEX, TROPONINI in the last 168 hours.  BNP (last 3 results)  Recent Labs  05/18/15 1139  BNP 434.4*    ProBNP (last 3 results) No results for input(s): PROBNP in the last 8760 hours.  CBG:  Recent Labs Lab 05/18/15 1042  GLUCAP 238*    Radiological Exams on Admission: Dg Chest Port 1 View  05/18/2015  CLINICAL DATA:  Shortness of breath and weakness. Chronic renal failure. EXAM: PORTABLE CHEST 1 VIEW COMPARISON:  April 23, 2015 FINDINGS: There is no edema or consolidation. The heart size is upper normal with pulmonary vascularity within normal limits. No adenopathy. There is atherosclerotic calcification in the aortic arch. No bone lesions. A sternal wire is noted, stable. IMPRESSION: No edema or consolidation. Electronically Signed   By: Lowella Grip III M.D.   On: 05/18/2015 11:08   Dg Foot Complete Left  05/18/2015  CLINICAL DATA:  First toe amputation 2 weeks ago with discoloration at the surgical site EXAM: LEFT FOOT - COMPLETE 3+ VIEW COMPARISON:  04/23/2015 FINDINGS: There is been surgical excision of the first toe and first metatarsal. No definitive bony erosion to suggest osteomyelitis is noted. No gross soft tissue abnormality is seen. Diffuse vascular calcifications are seen. IMPRESSION: Postsurgical changes without acute abnormality. Electronically Signed   By: Inez Catalina M.D.   On: 05/18/2015 12:04    EKG: Independently reviewed. Will order EKG  Assessment/Plan Active Problems:   Sarcoidosis (HCC)   ESRD on dialysis ([place])   CAD (coronary artery disease)  1-Left foot infection:  Admit to step down unit. IV vancomycin and Zosyn.  Dr [name] consulted. Patient might required amputation.  Lactic acid elevated, tachypnea, concern for early  sepsis. Received  IV fluids. Continue to cycle lactic acid.  Follow Blood cultures.   2-Coffee Ground Emesis: per wife patient vomited black liquid content. IV protonix, Check KUB patient relates epigastric pain.   3-ESRD; on peritoneal dialysis. Nephrologist consulted.   4-Diabetes:: hold lantus. SSI.   5-Chronic hypotension; On midodrine,  6-Lactic acidosis; could be related to foot infection.   Code Status: full code.  DVT Prophylaxis:DCS,  Family Communication: Care discussed with wife Disposition Plan: expect 3 to 4 days inpatient.  Time spent: 75 minutes.   Niel Hummer A [facility] Pager [phone]

## 2015-05-18 NOTE — Progress Notes (Signed)
05/18/2015 patient arrive from the emergency room to 2central at 1600. He is alert, oriented and non ambulatory. Patient stated he uses a electric wheelchair and walker. Patient left foot his great toe was amputated on November 25th. Patient where the toe was amputated was necrotic and have redness around the area. The wound was open to air. Patient sacrum excoriated, bilateral groin, abdominal foles excoriated. Right upper arm fistula positive and bruise on bilateral arms. Patient have a PD cath on the left abdomen. [facility] RN.

## 2015-05-18 NOTE — ED Notes (Signed)
CODE SEPSIS ACTIVATED @ 11:55

## 2015-05-18 NOTE — Progress Notes (Signed)
ANTIBIOTIC CONSULT NOTE - INITIAL  Pharmacy Consult for Vancomycin + Zosyn Indication: rule out sepsis  Allergies  Allergen Reactions  . Flu Virus Vaccine     Gets the flu, patient has bad reaction to the flu vaccine.  . Lipitor [Atorvastatin]     Leg cramps    Patient Measurements:   Wt: 107.5 kg  Vital Signs: Temp: 97.7 F (36.5 C) (12/11 1136) Temp Source: Rectal (12/11 1136) BP: 105/69 mmHg (12/11 1111) Pulse Rate: 102 (12/11 1111) Intake/Output from previous day:   Intake/Output from this shift:    Labs:  Recent Labs  05/18/15 1138  WBC 9.7  HGB 14.3  PLT 365   CrCl cannot be calculated (Unknown ideal weight.). No results for input(s): VANCOTROUGH, VANCOPEAK, VANCORANDOM, GENTTROUGH, GENTPEAK, GENTRANDOM, TOBRATROUGH, TOBRAPEAK, TOBRARND, AMIKACINPEAK, AMIKACINTROU, AMIKACIN in the last 72 hours.   Microbiology: Recent Results (from the past 720 hour(s))  Culture, blood (routine x 2)     Status: None   Collection Time: 04/23/15  3:39 PM  Result Value Ref Range Status   Specimen Description BLOOD LEFT ARM  Final   Special Requests IN PEDIATRIC BOTTLE 3CC  Final   Culture NO GROWTH 5 DAYS  Final   Report Status 04/28/2015 FINAL  Final  Culture, blood (routine x 2)     Status: None   Collection Time: 04/23/15  9:30 PM  Result Value Ref Range Status   Specimen Description BLOOD LEFT HAND  Final   Special Requests BOTTLES DRAWN AEROBIC ONLY 5CC  Final   Culture NO GROWTH 5 DAYS  Final   Report Status 04/28/2015 FINAL  Final  Culture, body fluid-bottle     Status: None   Collection Time: 04/23/15 11:45 PM  Result Value Ref Range Status   Specimen Description FLUID PERITONEAL  Final   Special Requests BOTTLES DRAWN AEROBIC AND ANAEROBIC 10CC  Final   Culture NO GROWTH 5 DAYS  Final   Report Status 04/29/2015 FINAL  Final  Gram stain     Status: None   Collection Time: 04/23/15 11:45 PM  Result Value Ref Range Status   Specimen Description FLUID  PERITONEAL  Final   Special Requests NONE  Final   Gram Stain NO WBC SEEN NO ORGANISMS SEEN   Final   Report Status 04/24/2015 FINAL  Final  Surgical pcr screen     Status: None   Collection Time: 04/30/15  6:51 AM  Result Value Ref Range Status   MRSA, PCR NEGATIVE NEGATIVE Final   Staphylococcus aureus NEGATIVE NEGATIVE Final    Comment:        The Xpert SA Assay (FDA approved for NASAL specimens in patients over 56 years of age), is one component of a comprehensive surveillance program.  Test performance has been validated by [facility] for patients greater than or equal to 72 year old. It is not intended to diagnose infection nor to guide or monitor treatment.     Medical History: Past Medical History  Diagnosis Date  . Hypertension   . Diabetes mellitus   . Hypothyroidism   . CHF (congestive heart failure) ([place])   . Anemia   . Blind left eye   . Sleep apnea     not on CPAP  . Coronary artery disease     CABG x 11 Jun 2009.  MRSA infections of incsions  . Arthritis   . Sarcoidosis ([place])     Hx:of  . Diabetic retinopathy ([place])     Hx:  of bilateral  . Edema   . Hyperlipidemia   . 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 Atherosclerosis of native arteries of the extremities, unspecified   . Eczema     Hx: of  . Insomnia   . Seasonal allergies   . ESRD on dialysis [facility])     Was on dialysis in July 2011 and then stopped and restarted May 2014. He then transitioned to PD in Aug 2014. He does PD on cycler when at home and CAPD 6 exchanges per day when travelling. His wife is "in charge" and does the exchanges.   . History of blood transfusion     with heart surgery  . Coronary atherosclerosis of native coronary artery   . Atherosclerosis of native arteries of the extremities, unspecified     Assessment: 74 YOM with ESRD-on PD with recent history of L-1st ray amputation on 04/30/15 who presented to the [facility] on 12/11 with SOB, vomiting,  and weakness. The L-foot was noted to be red concerning for infection. Pharmacy was consulted to start Vancomycin + Zosyn for r/o sepsis with possible wound source. Wt: 107.5 kg.   Code sepsis was called on this patient @ 1157. Will plan to give Vancomycin 1g and Zosyn 3.375g IV x 1 now since these doses can be easily accessed from the pyxis and will give additional Vancomycin to complete the load once the first dose has finished infusing.   The first doses were pulled from the pyxis and handed to the RN at 1208 with instructions that they can be given together and that they needed to be administered prior to 1257. The RN is aware - and currently having difficulties establishing IV access.   Goal of Therapy:  Vancomycin trough level 15-20 mcg/ml  Proper antibiotics for infection/cultures adjusted for renal/hepatic function   Plan:  1. Vancomycin 1g IV x 1 dose now to be pulled from the ED pyxis 2. Will give an additional 1250 mg x 1 dose after 1g dose finishes infusion for a total loading dose of 2250 mg 3. Will not enter any standing Vancomycin doses for now - if the patient continues on PD, will plan to check a VR in 3-7 days to address need for additional doses. Will plan to redose when VR <20 mcg/ml.  4. Zosyn 3.375g IV x 1 dose followed by 2.25g IV every 8 hours 5. Will continue to follow PD schedule/duration, residual UOP, culture results, LOT, and antibiotic de-escalation plans   Alycia Rossetti, PharmD, BCPS Clinical Pharmacist Pager: [phone] 05/18/2015 12:14 PM

## 2015-05-18 NOTE — ED Notes (Signed)
Dr. Tyrell Antonio notified of patients Lactic Acid

## 2015-05-18 NOTE — ED Notes (Addendum)
Pt is peritoneal dialysis pt, last tx this am. Reports generalized weakness for months, more severe x 2 days.n/v x 3 months.

## 2015-05-18 NOTE — Consult Note (Signed)
Renal Service Consult Note [facility]  [place] 05/18/2015 Sol Blazing Requesting Physician:  Dr Ashok Cordia  Reason for Consult:  ESRD pt with N/V and foot infection HPI: The patient is a 74 y.o. year-old with hx of DM2, HTN, anemia, OSA, CAD/ CABG, sarcoid and ESRD on CAPD since about 2014.  Patient admitted here last month with intractable n/v and gangrenous L great toe. VVS saw pt and did angio, not candidate for revascularization. Seen by ortho who did L great toe amputation and patient dc'd on po doxy. Had +gastric empty study for gastroparesis during that stay.   Now presenting with refractory n/v and worsening L foot wound in spite of po abx.  Denies any problems with PD, no SOB/ cough/ cloudy fluid or CP.   ROS  no abd pain, diarrhea  no chest pain  no prod cough  no confusion  +chills  =subj fevers  Past Medical History  Past Medical History  Diagnosis Date  . Hypertension   . Diabetes mellitus   . Hypothyroidism   . CHF (congestive heart failure) ([place])   . Anemia   . Blind left eye   . Sleep apnea     not on CPAP  . Coronary artery disease     CABG x 11 Jun 2009.  MRSA infections of incsions  . Arthritis   . Sarcoidosis ([place])     Hx:of  . Diabetic retinopathy ([place])     Hx: of bilateral  . Edema   . Hyperlipidemia   . 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 Atherosclerosis of native arteries of the extremities, unspecified   . Eczema     Hx: of  . Insomnia   . Seasonal allergies   . ESRD on dialysis [facility])     Was on dialysis in July 2011 and then stopped and restarted May 2014. He then transitioned to PD in Aug 2014. He does PD on cycler when at home and CAPD 6 exchanges per day when travelling. His wife is "in charge" and does the exchanges.   . History of blood transfusion     with heart surgery  . Coronary atherosclerosis of native coronary artery   . Atherosclerosis of native arteries of the extremities,  unspecified    Past Surgical History  Past Surgical History  Procedure Laterality Date  . Cardiac surgery      total of 6 surgeries, 5 related to mrsa  . Coronary artery bypass graft  06/2009  . Cataract surgery      Hx: of  . Debridements      Hx: of secondary to MRSA  . Av fistula placement Right 10/11/2012    Procedure: ARTERIOVENOUS (AV) FISTULA CREATION;  Surgeon: Rosetta Posner, MD;  Location: [employer];  Service: Vascular;  Laterality: Right;  . Insertion of dialysis catheter Left   . Capd insertion N/A 11/28/2012    Procedure: LAPAROSCOPIC PERITONEAL DIALYSIS CATHETER PLACEMENT;  Surgeon: Ralene Ok, MD;  Location: [place];  Service: General;  Laterality: N/A;  . Left heart catheterization with coronary/graft angiogram N/A 01/31/2014    Procedure: LEFT HEART CATHETERIZATION WITH Beatrix Fetters;  Surgeon: Troy Sine, MD;  Location: [facility] CATH LAB;  Service: Cardiovascular;  Laterality: N/A;  . Peripheral vascular catheterization N/A 04/28/2015    Procedure: Abdominal Aortogram w/Lower Extremity;  Surgeon: Conrad Churubusco, MD;  Location: [place] CV LAB;  Service: Cardiovascular;  Laterality: N/A;  . Amputation Left 04/30/2015  Procedure: 1st Ray Amputation Left Foot;  Surgeon: Newt Minion, MD;  Location: [place];  Service: Orthopedics;  Laterality: Left;   Family History  Family History  Problem Relation Age of Onset  . COPD Mother   . COPD Father    Social History  reports that he has never smoked. He has never used smokeless tobacco. He reports that he does not drink alcohol or use illicit drugs. Allergies  Allergies  Allergen Reactions  . Flu Virus Vaccine     Gets the flu, patient has bad reaction to the flu vaccine.  . Lipitor [Atorvastatin]     Leg cramps   Home medications Prior to Admission medications   Medication Sig Start Date End Date Taking? Authorizing Provider  Insulin Glargine (LANTUS) 100 UNIT/ML Solostar Pen Inject 45 units into the skin daily.  Dx 250.00 Patient taking differently: 40 Units by Subconjunctival route at bedtime.  07/26/13  Yes Lauree Chandler, NP  allopurinol (ZYLOPRIM) 100 MG tablet Take 100 mg by mouth daily.    Historical Provider, MD  aspirin 325 MG tablet Take 325 mg by mouth daily.    Historical Provider, MD  calcium carbonate (TUMS - DOSED IN MG ELEMENTAL CALCIUM) 500 MG chewable tablet Chew 2 tablets by mouth 3 (three) times daily with meals.     Historical Provider, MD  cetirizine (ZYRTEC) 10 MG tablet Take 10 mg by mouth daily.    Historical Provider, MD  doxycycline (VIBRA-TABS) 100 MG tablet Take 1 tablet (100 mg total) by mouth 2 (two) times daily. 05/02/15   Costin Karlyne Greenspan, MD  insulin aspart (NOVOLOG) 100 UNIT/ML injection Inject 10-15 Units into the skin 3 (three) times daily with meals. Take none if blood sugar < 150, 151-200 2 units, 201-250 4 units, 251-300 6 units, 301-350 8 units    Historical Provider, MD  lactulose (CHRONULAC) 10 GM/15ML solution Take 10 g by mouth every 4 (four) hours as needed for mild constipation.    Historical Provider, MD  levothyroxine (SYNTHROID, LEVOTHROID) 112 MCG tablet LABS/APPOINTMENT OVERDUE, 1 by mouth daily in the morning 30 minutes before breakfast for Thyroid 12/10/13   Tiffany L Reed, DO  loperamide (IMODIUM) 2 MG capsule Take 2 mg by mouth daily as needed for diarrhea or loose stools.    Historical Provider, MD  metoCLOPramide (REGLAN) 5 MG tablet Take 2 tablets (10 mg total) by mouth 3 (three) times daily before meals. 05/02/15   Costin Karlyne Greenspan, MD  midodrine (PROAMATINE) 10 MG tablet Take 1 tablet (10 mg total) by mouth 3 (three) times daily. 03/11/15   Pixie Casino, MD  Multiple Vitamin (MULTI VITAMIN DAILY PO) Take 1 tablet by mouth daily.    Historical Provider, MD  omeprazole (PRILOSEC) 10 MG capsule Take 10 mg by mouth daily.    Historical Provider, MD  oxyCODONE-acetaminophen (PERCOCET/ROXICET) 5-325 MG tablet Take 1-2 tablets by mouth every 6 (six) hours  as needed for moderate pain. 05/02/15   Vale Summit, MD  PERITONEAL DIALYSIS SOLUTIONS IP Inject 2,500 Units into the peritoneum 4 (four) times daily.     Historical Provider, MD  potassium chloride SA (K-DUR,KLOR-CON) 20 MEQ tablet Take 20 mEq by mouth daily.    Historical Provider, MD  silver sulfADIAZINE (SILVADENE) 1 % cream Apply 1 application topically daily. 04/22/15   Trula Slade, DPM   Liver Function Tests  Recent Labs Lab 05/18/15 1138  AST 38  ALT 18  ALKPHOS 155*  BILITOT 0.5  PROT 7.4  ALBUMIN 2.6*   No results for input(s): LIPASE, AMYLASE in the last 168 hours. CBC  Recent Labs Lab 05/18/15 1138  WBC 9.7  NEUTROABS 7.3  HGB 14.3  HCT 44.3  MCV 101.4*  PLT 99991111   Basic Metabolic Panel  Recent Labs Lab 05/18/15 1138  NA 130*  K 4.4  CL 91*  CO2 23  GLUCOSE 303*  BUN 38*  CREATININE 6.59*  CALCIUM 9.5    Filed Vitals:   05/18/15 1136 05/18/15 1200 05/18/15 1240 05/18/15 1300  BP:  101/67 127/72 130/55  Pulse:   97 96  Temp: 97.7 F (36.5 C)     TempSrc: Rectal     Resp:   23 34  SpO2:   100% 100%   Exam Elderly WM, tremulous, chills, no distress, calm No rash, cyanosis or gangrene Sclera anicteric, throat clear No jvd Chest clear bilat no rales/ wheezes RRR distant HS no RG Abd soft ntnd PD cath LLQ clean exit site no ascites GU normal male Ext RLE no edema/ wounds' LLE 1st toe amputated, large area erythema/ black eschar about the wound from recent toe amp Neuro nf, Ox 3, tremulous   CAPD  5 exchanges per day (9-12-3-6-9p), 3000 cc fill vol, alternates 1.5% /2.5%. On PD 1-2 yrs   Assessment: 1 Nausea/ vomiting - prob due to L foot infection 2 L foot infection, s/p recent great toe amp - per primary 3 DM2 lots of complications, on insulin 4 ESRD on CAPD 5 Hypotens on midodrine 6 CAD hx cabg 7 RUA AV fistula - never used   Plan - continue PD , use all 1.5% given n/v to limit UF to minimum amount. Agree w fluid  bolus of 2 liters.   [name] MD [facility] pager [phone]    cell [phone] 05/18/2015, 1:16 PM

## 2015-05-18 NOTE — Progress Notes (Signed)
05/18/2015 patient vomited when came to the  Floor. He also vomited this evening at 1745. He was given zosyn. [facility] RN.

## 2015-05-18 NOTE — ED Notes (Signed)
Per Renaldo Reel, MD, to only give 2 liters instead of 3.5

## 2015-05-19 ENCOUNTER — Other Ambulatory Visit: Payer: Self-pay

## 2015-05-19 DIAGNOSIS — K3184 Gastroparesis: Secondary | ICD-10-CM | POA: Diagnosis present

## 2015-05-19 DIAGNOSIS — E1129 Type 2 diabetes mellitus with other diabetic kidney complication: Secondary | ICD-10-CM | POA: Diagnosis present

## 2015-05-19 DIAGNOSIS — K920 Hematemesis: Secondary | ICD-10-CM | POA: Diagnosis present

## 2015-05-19 DIAGNOSIS — I9589 Other hypotension: Secondary | ICD-10-CM | POA: Diagnosis present

## 2015-05-19 LAB — CBC
HEMATOCRIT: 34.9 % — AB (ref 39.0–52.0)
HEMOGLOBIN: 11.1 g/dL — AB (ref 13.0–17.0)
MCH: 32.2 pg (ref 26.0–34.0)
MCHC: 31.8 g/dL (ref 30.0–36.0)
MCV: 101.2 fL — ABNORMAL HIGH (ref 78.0–100.0)
Platelets: 359 10*3/uL (ref 150–400)
RBC: 3.45 MIL/uL — AB (ref 4.22–5.81)
RDW: 15.3 % (ref 11.5–15.5)
WBC: 7.7 10*3/uL (ref 4.0–10.5)

## 2015-05-19 LAB — COMPREHENSIVE METABOLIC PANEL
ALBUMIN: 1.9 g/dL — AB (ref 3.5–5.0)
ALT: 14 U/L — AB (ref 17–63)
AST: 24 U/L (ref 15–41)
Alkaline Phosphatase: 107 U/L (ref 38–126)
Anion gap: 10 (ref 5–15)
BILIRUBIN TOTAL: 0.7 mg/dL (ref 0.3–1.2)
BUN: 36 mg/dL — AB (ref 6–20)
CO2: 28 mmol/L (ref 22–32)
CREATININE: 6.11 mg/dL — AB (ref 0.61–1.24)
Calcium: 8.1 mg/dL — ABNORMAL LOW (ref 8.9–10.3)
Chloride: 98 mmol/L — ABNORMAL LOW (ref 101–111)
GFR calc Af Amer: 9 mL/min — ABNORMAL LOW (ref >60)
GFR calc non Af Amer: 8 mL/min — ABNORMAL LOW (ref >60)
GLUCOSE: 191 mg/dL — AB (ref 65–99)
POTASSIUM: 4.3 mmol/L (ref 3.5–5.1)
Sodium: 136 mmol/L (ref 135–145)
TOTAL PROTEIN: 5.4 g/dL — AB (ref 6.5–8.1)

## 2015-05-19 LAB — GLUCOSE, CAPILLARY
GLUCOSE-CAPILLARY: 159 mg/dL — AB (ref 65–99)
Glucose-Capillary: 140 mg/dL — ABNORMAL HIGH (ref 65–99)
Glucose-Capillary: 244 mg/dL — ABNORMAL HIGH (ref 65–99)
Glucose-Capillary: 181 mg/dL — ABNORMAL HIGH (ref 65–99)

## 2015-05-19 LAB — HEMOGLOBIN AND HEMATOCRIT, BLOOD
HCT: 37.3 % — ABNORMAL LOW (ref 39.0–52.0)
Hemoglobin: 11.6 g/dL — ABNORMAL LOW (ref 13.0–17.0)

## 2015-05-19 MED ORDER — RENA-VITE PO TABS
1.0000 | ORAL_TABLET | Freq: Two times a day (BID) | ORAL | Status: DC
  Administered 2015-05-19 – 2015-05-20 (×3): 1 via ORAL
  Administered 2015-05-20: 12:00:00 via ORAL
  Administered 2015-05-21: 1 via ORAL
  Filled 2015-05-19 (×5): qty 1

## 2015-05-19 MED ORDER — B COMPLEX-C PO TABS
1.0000 | ORAL_TABLET | Freq: Two times a day (BID) | ORAL | Status: DC
  Administered 2015-05-19 – 2015-05-21 (×5): 1 via ORAL
  Filled 2015-05-19 (×6): qty 1

## 2015-05-19 MED ORDER — BOOST / RESOURCE BREEZE PO LIQD
1.0000 | Freq: Three times a day (TID) | ORAL | Status: DC
  Administered 2015-05-19: 1 via ORAL
  Administered 2015-05-20: 18:00:00 via ORAL
  Administered 2015-05-20 – 2015-05-21 (×2): 1 via ORAL

## 2015-05-19 MED ORDER — CALCIUM CARBONATE ANTACID 500 MG PO CHEW
2.0000 | CHEWABLE_TABLET | Freq: Three times a day (TID) | ORAL | Status: DC
  Administered 2015-05-19 – 2015-05-21 (×6): 400 mg via ORAL
  Filled 2015-05-19 (×6): qty 2

## 2015-05-19 MED ORDER — DELFLEX-LC/1.5% DEXTROSE 346 MOSM/L IP SOLN
Freq: Every day | INTRAPERITONEAL | Status: DC
  Administered 2015-05-19 – 2015-05-20 (×3): 3000 mL via INTRAPERITONEAL

## 2015-05-19 MED ORDER — SODIUM CHLORIDE 0.9 % IV BOLUS (SEPSIS)
250.0000 mL | Freq: Once | INTRAVENOUS | Status: AC
  Administered 2015-05-19: 250 mL via INTRAVENOUS

## 2015-05-19 MED ORDER — COLLAGENASE 250 UNIT/GM EX OINT
TOPICAL_OINTMENT | Freq: Every day | CUTANEOUS | Status: DC
  Administered 2015-05-19 – 2015-05-21 (×3): via TOPICAL
  Filled 2015-05-19: qty 30

## 2015-05-19 MED ORDER — METOCLOPRAMIDE HCL 10 MG PO TABS
10.0000 mg | ORAL_TABLET | Freq: Three times a day (TID) | ORAL | Status: DC
  Administered 2015-05-19 – 2015-05-21 (×6): 10 mg via ORAL
  Filled 2015-05-19 (×6): qty 1

## 2015-05-19 NOTE — Progress Notes (Addendum)
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 on 11/23, ESRD on peritoneal dialysis, diabetes, gastroparesis, chronic hypotension, coronary artery disease who presented to the emergency room with coffee-ground emesis and worsening left foot infection. Hemoglobin was normal. Patient did have a lactic acidosis. GI, nephrology, orthopedics consult at by admitting physician.  RD spoke with patient and wife at bedside.  Pt reports he hasn't been eating well due to nausea & vomiting PTA.  + coffee ground emesis with dysphagia/odynophagia.  GI following.  Plan is for EGD with possible dilatation tomorrow AM.  Pt does drink Nepro supplements at home.  Nutrition focused physical exam completed.  No muscle or subcutaneous fat depletion noticed.  Diet Order:  Diet clear liquid Room service appropriate?: Yes; Fluid consistency:: Thin  Skin:  Wound (see comment) (L foot infection)  Last BM:  12/11  Height:   Ht Readings from Last 1 Encounters:  05/18/15 5\' 9"  (1.753 m)    Weight:   Wt Readings from Last 1 Encounters:  05/19/15 232 lb 12.9 oz (105.6 kg)    Ideal Body Weight:  73 kg  BMI:  Body mass index is 34.36 kg/(m^2).  Estimated Nutritional Needs:   Kcal:  1900-2100  Protein:  100-110 gm  Fluid:  1200 ml  EDUCATION NEEDS:   No education needs identified at this time  Arthur Holms, RD, LDN Pager #: [phone] After-Hours Pager #:  [phone]

## 2015-05-19 NOTE — Progress Notes (Signed)
Critical lactic acid-4.1. Rogue Bussing, NP notified. Bolus infusing.  M.Forest Gleason, RN

## 2015-05-19 NOTE — Progress Notes (Signed)
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 on 11/23, ESRD on peritoneal dialysis, diabetes, gastroparesis, chronic hypotension, coronary artery disease who presented to the emergency room with coffee-ground emesis and worsening left foot infection. Hemoglobin was normal. Patient did have a lactic acidosis. GI, nephrology, orthopedics consult at by admitting physician.  Assessment/Plan:  Principal Problem:   Diabetic foot infection ([place]) seen by [name].  Noted to have ischemic changes. He feels that transtibial amputation would be reasonable, but patient declines at this time. [name] feels it safe to continue with local wound care and recommend Santyl dressings and he will follow-up in office. Given his acute on chronic hypotension, lactic acidosis, I will continue empiric antibiotics for now.  Active Problems:   Sarcoidosis ([place]) stable    ESRD on peritoneal dialysis [facility]): Seen by Dr. Jonnie Finner.    CAD (coronary artery disease) stable    Sepsis ([place]): Based on lactic acidosis and hypotension. Blood pressure readings erratic but as low as 60 systolic overnight. Will discontinue nitroglycerin patch. ESRD makes fluid resuscitation risky. Patient clinically looks nontoxic this morning and hold off on bolus for now. See above. Continue step down monitoring. Trend lactates.    Coffee ground emesis with odynophagia and history of gastroparesis: Hemoglobin dropped a bit. Dr. Tyrell Antonio spoke with Dr. Paulita Fujita on Sunday. No note yet from GI. Patient's wife reports he was scheduled for elective EGD on the 21st with Dr. Paulita Fujita. Have placed call to GI to ensure a consult will occur today. Consider inpatient EGD. However, patient has not had further coffee ground emesis, or melena, and is tolerating a clear liquid trial. Continue Reglan  and Protonix    Gastroparesis    Acute on Chronic hypotension: Patient's wife reports that his blood pressure usually does not get into the 60s. Likely related to sepsis and a component of acute blood loss anemia.    DM (diabetes mellitus), type 2 with renal complications [facility]): Fairly well controlled on sliding scale insulin. Long-acting insulin held.   Code Status:  full Family Communication:  Wife at bedside Disposition Plan:  Home stable, likely 4-5 days  Consultants:  [facility]  Nephrology  Orthopedics, [name]  Procedures:     Antibiotics:  Vancomycin zosyn 12/11  HPI/Subjective: Had severe epigastric pain last night but now resolved. No nausea today. A little bit of Jell-O. Would like to stick with clear liquids. No other complaints.  Objective: Filed Vitals:   05/19/15 0700 05/19/15 0900  BP: 87/59 126/63  Pulse: 92 99  Temp: 98.5 F (36.9 C)   Resp: 24 14    Intake/Output Summary (Last 24 hours) at 05/19/15 0937 Last data filed at 05/19/15 0600  Gross per 24 hour  Intake   5850 ml  Output   3900 ml  Net   1950 ml   Filed Weights   05/18/15 2000 05/19/15 0500  Weight: 105.235 kg (232 lb) 105.6 kg (232 lb 12.9 oz)    Exam:   General:  Alert, oriented, nontoxic appearing.  Cardiovascular: Regular rate rhythm without murmurs gallops rubs  Respiratory: Clear to auscultation bilaterally without wheezes rhonchi or rales  Abdomen: Soft nontender nondistended. Bowel sounds present.  Ext: Left foot in dressing. No peripheral pitting edema.  Basic Metabolic Panel:  Recent Labs Lab 05/18/15 1138 05/19/15 0226  NA 130* 136  K 4.4 4.3  CL 91* 98*  CO2 23 28  GLUCOSE 303* 191*  BUN 38* 36*  CREATININE 6.59* 6.11*  CALCIUM 9.5 8.1*   Liver Function Tests:  Recent Labs Lab 05/18/15 1138 05/19/15 0226  AST 38 24  ALT 18 14*  ALKPHOS 155* 107  BILITOT 0.5 0.7  PROT 7.4 5.4*  ALBUMIN 2.6* 1.9*   No results for input(s): LIPASE, AMYLASE  in the last 168 hours. No results for input(s): AMMONIA in the last 168 hours. CBC:  Recent Labs Lab 05/18/15 1138 05/18/15 1813 05/19/15 0226 05/19/15 0910  WBC 9.7  --  7.7  --   NEUTROABS 7.3  --   --   --   HGB 14.3 13.8 11.1* 11.6*  HCT 44.3 41.7 34.9* 37.3*  MCV 101.4*  --  101.2*  --   PLT 365  --  359  --    Cardiac Enzymes: No results for input(s): CKTOTAL, CKMB, CKMBINDEX, TROPONINI in the last 168 hours. BNP (last 3 results)  Recent Labs  05/18/15 1139  BNP 434.4*    ProBNP (last 3 results) No results for input(s): PROBNP in the last 8760 hours.  CBG:  Recent Labs Lab 05/18/15 1042 05/18/15 1729 05/18/15 2040 05/19/15 0718  GLUCAP 238* 276* 188* 159*    No results found for this or any previous visit (from the past 240 hour(s)).   Studies: Dg Chest Port 1 View  05/18/2015  CLINICAL DATA:  Shortness of breath and weakness. Chronic renal failure. EXAM: PORTABLE CHEST 1 VIEW COMPARISON:  April 23, 2015 FINDINGS: There is no edema or consolidation. The heart size is upper normal with pulmonary vascularity within normal limits. No adenopathy. There is atherosclerotic calcification in the aortic arch. No bone lesions. A sternal wire is noted, stable. IMPRESSION: No edema or consolidation. Electronically Signed   By: Lowella Grip III M.D.   On: 05/18/2015 11:08   Dg Abd Portable 1v  05/18/2015  CLINICAL DATA:  Abdominal pain EXAM: PORTABLE ABDOMEN - 1 VIEW COMPARISON:  None. FINDINGS: Scattered large and small bowel gas is noted. Fecal material is noted throughout the colon. A dialysis catheter is noted within the deep pelvis. No free air is seen. Mild degenerative changes of the lumbar spine are noted. IMPRESSION: No acute abnormality seen Electronically Signed   By: Inez Catalina M.D.   On: 05/18/2015 16:52   Dg Foot Complete Left  05/18/2015  CLINICAL DATA:  First toe amputation 2 weeks ago with discoloration at the surgical site EXAM: LEFT FOOT -  COMPLETE 3+ VIEW COMPARISON:  04/23/2015 FINDINGS: There is been surgical excision of the first toe and first metatarsal. No definitive bony erosion to suggest osteomyelitis is noted. No gross soft tissue abnormality is seen. Diffuse vascular calcifications are seen. IMPRESSION: Postsurgical changes without acute abnormality. Electronically Signed   By: Inez Catalina M.D.   On: 05/18/2015 12:04    Scheduled Meds: . allopurinol  100 mg Oral Daily  . calcium carbonate  2 tablet Oral TID WC  . collagenase   Topical Daily  . dialysis solution 1.5% low-MG/low-CA   Intraperitoneal 5 X Daily  . insulin aspart  0-9 Units Subcutaneous TID WC  . levothyroxine  112 mcg Oral QAC breakfast  . metoCLOPramide (REGLAN) injection  5 mg Intravenous 3 times per day  . metoCLOPramide  10 mg Oral TID AC  . midodrine  10 mg Oral TID  . pantoprazole (PROTONIX) IV  40 mg Intravenous Q12H  . piperacillin-tazobactam (ZOSYN)  IV  2.25 g Intravenous Q8H   Continuous Infusions:   Time spent: 35 minutes  [facility] [email or web address], password [facility] 05/19/2015, 9:37 AM  LOS: 1 day

## 2015-05-19 NOTE — Progress Notes (Signed)
Utilization review completed. Terina Mcelhinny, RN, BSN. 

## 2015-05-19 NOTE — Progress Notes (Addendum)
  [facility] Progress Note   Subjective: no complaints  Filed Vitals:   05/19/15 0500 05/19/15 0640 05/19/15 0700 05/19/15 0900  BP: 90/50 106/61 87/59 126/63  Pulse: 101 99 92 99  Temp:   98.5 F (36.9 C)   TempSrc:   Oral   Resp: 21 19 24 14   Height:      Weight: 105.6 kg (232 lb 12.9 oz)     SpO2: 98% 93% 100% 98%    Inpatient medications: . allopurinol  100 mg Oral Daily  . calcium carbonate  2 tablet Oral TID WC  . collagenase   Topical Daily  . dialysis solution 1.5% low-MG/low-CA   Intraperitoneal 5 X Daily  . feeding supplement  1 Container Oral TID BM  . insulin aspart  0-9 Units Subcutaneous TID WC  . levothyroxine  112 mcg Oral QAC breakfast  . metoCLOPramide  10 mg Oral TID AC  . midodrine  10 mg Oral TID  . pantoprazole (PROTONIX) IV  40 mg Intravenous Q12H  . piperacillin-tazobactam (ZOSYN)  IV  2.25 g Intravenous Q8H     acetaminophen **OR** acetaminophen, fentaNYL (SUBLIMAZE) injection, heparin, lactulose, ondansetron **OR** ondansetron (ZOFRAN) IV, oxyCODONE  Exam: Elderly WM, no distress No jvd Chest clear bilat RRR distant HS no RG Abd soft ntnd PD cath LLQ clean exit site Ext RLE no edema/ wounds Left foot wrapped Neuro nf, Ox 3, calm  CAPD 5 exchanges per day (9-12-3-6-9p), 3000 cc fill vol, alternates 1.5% /2.5%. On PD 1-2 yrs  Abd xray stool throughout colon, no acute findings  Assessment: 1 Foot infection, recent great toe amp - on IV abx, seen by ortho 2 ESRD CAPD has very long drain time, poss from constipation 3 DM2 lots of complications, on insulin 4 Vol looks euvolemic 5 Hypotens on midodrine 6 CAD hx cabg 7 RUA AV fistula - never used 8 Nutrition added MVI/ B/ C  Plan - cont PD, all 1.5%   [name] MD [facility] pager [phone]    cell [phone] 05/19/2015, 12:34 PM    Recent Labs Lab 05/18/15 1138 05/19/15 0226  NA 130* 136  K 4.4 4.3  CL 91* 98*  CO2 23 28  GLUCOSE 303*  191*  BUN 38* 36*  CREATININE 6.59* 6.11*  CALCIUM 9.5 8.1*    Recent Labs Lab 05/18/15 1138 05/19/15 0226  AST 38 24  ALT 18 14*  ALKPHOS 155* 107  BILITOT 0.5 0.7  PROT 7.4 5.4*  ALBUMIN 2.6* 1.9*    Recent Labs Lab 05/18/15 1138 05/18/15 1813 05/19/15 0226 05/19/15 0910  WBC 9.7  --  7.7  --   NEUTROABS 7.3  --   --   --   HGB 14.3 13.8 11.1* 11.6*  HCT 44.3 41.7 34.9* 37.3*  MCV 101.4*  --  101.2*  --   PLT 365  --  359  --

## 2015-05-19 NOTE — Consult Note (Signed)
[facility] Consult Note  Referring Provider: No ref. provider found Primary Care Physician:  [name], MD Primary Gastroenterologist:  Dr.  Laurel Dimmer Complaint: Nausea vomiting and epigastric pain. Previous dysphagia without dysphagia. HPI: Jack Chung is an 74 y.o. white male  admitted yesterday with coffee-ground emesis and worsening left foot infection felt to be septic based on lactic acidosis. He had been seen by Dr. Paulita Fujita as an outpatient for proceeding dysphagia and odynophagia and was scheduled for endoscopy this week. He is feeling somewhat better this morning after hydration and antibiotics and tolerated some clear liquid diet.  Past Medical History  Diagnosis Date  . Hypertension   . Diabetes mellitus   . Hypothyroidism   . CHF (congestive heart failure) ([place])   . Anemia   . Blind left eye   . Sleep apnea     not on CPAP  . Coronary artery disease     CABG x 11 Jun 2009.  MRSA infections of incsions  . Arthritis   . Sarcoidosis ([place])     Hx:of  . Diabetic retinopathy ([place])     Hx: of bilateral  . Edema   . Hyperlipidemia   . 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 Atherosclerosis of native arteries of the extremities, unspecified   . Eczema     Hx: of  . Insomnia   . Seasonal allergies   . ESRD on dialysis [facility])     Was on dialysis in July 2011 and then stopped and restarted May 2014. He then transitioned to PD in Aug 2014. He does PD on cycler when at home and CAPD 6 exchanges per day when travelling. His wife is "in charge" and does the exchanges.   . History of blood transfusion     with heart surgery  . Coronary atherosclerosis of native coronary artery   . Atherosclerosis of native arteries of the extremities, unspecified     Past Surgical History  Procedure Laterality Date  . Cardiac surgery      total of 6 surgeries, 5 related to mrsa  . Coronary artery bypass graft  06/2009  . Cataract surgery      Hx: of   . Debridements      Hx: of secondary to MRSA  . Av fistula placement Right 10/11/2012    Procedure: ARTERIOVENOUS (AV) FISTULA CREATION;  Surgeon: Rosetta Posner, MD;  Location: [place];  Service: Vascular;  Laterality: Right;  . Insertion of dialysis catheter Left   . Capd insertion N/A 11/28/2012    Procedure: LAPAROSCOPIC PERITONEAL DIALYSIS CATHETER PLACEMENT;  Surgeon: Ralene Ok, MD;  Location: [place];  Service: General;  Laterality: N/A;  . Left heart catheterization with coronary/graft angiogram N/A 01/31/2014    Procedure: LEFT HEART CATHETERIZATION WITH Beatrix Fetters;  Surgeon: Troy Sine, MD;  Location: [facility] CATH LAB;  Service: Cardiovascular;  Laterality: N/A;  . Peripheral vascular catheterization N/A 04/28/2015    Procedure: Abdominal Aortogram w/Lower Extremity;  Surgeon: Conrad Montezuma, MD;  Location: [place] CV LAB;  Service: Cardiovascular;  Laterality: N/A;  . Amputation Left 04/30/2015    Procedure: 1st Ray Amputation Left Foot;  Surgeon: Newt Minion, MD;  Location: [place];  Service: Orthopedics;  Laterality: Left;    Medications Prior to Admission  Medication Sig Dispense Refill  . allopurinol (ZYLOPRIM) 100 MG tablet Take 100 mg by mouth daily.    Marland Kitchen aspirin 325 MG tablet Take 325 mg by mouth daily.    Marland Kitchen  calcium carbonate (TUMS - DOSED IN MG ELEMENTAL CALCIUM) 500 MG chewable tablet Chew 2 tablets by mouth 3 (three) times daily with meals.     . cetirizine (ZYRTEC) 10 MG tablet Take 10 mg by mouth daily.    Marland Kitchen doxycycline (VIBRA-TABS) 100 MG tablet Take 1 tablet (100 mg total) by mouth 2 (two) times daily. 10 tablet 0  . gentamicin cream (GARAMYCIN) 0.1 % Apply 1 application topically at bedtime.    . insulin aspart (NOVOLOG) 100 UNIT/ML injection Inject 10-15 Units into the skin 3 (three) times daily with meals. Take none if blood sugar < 150, 151-200 2 units, 201-250 4 units, 251-300 6 units, 301-350 8 units    . Insulin Glargine (LANTUS) 100 UNIT/ML  Solostar Pen Inject 45 units into the skin daily. Dx 250.00 (Patient taking differently: 40 Units by Subconjunctival route at bedtime. ) 15 mL 6  . lactulose (CHRONULAC) 10 GM/15ML solution Take 10 g by mouth every 4 (four) hours as needed for mild constipation.    Marland Kitchen levothyroxine (SYNTHROID, LEVOTHROID) 112 MCG tablet LABS/APPOINTMENT OVERDUE, 1 by mouth daily in the morning 30 minutes before breakfast for Thyroid 15 tablet 0  . loperamide (IMODIUM) 2 MG capsule Take 2 mg by mouth daily as needed for diarrhea or loose stools.    . metoCLOPramide (REGLAN) 5 MG tablet Take 2 tablets (10 mg total) by mouth 3 (three) times daily before meals. 180 tablet 0  . midodrine (PROAMATINE) 10 MG tablet Take 1 tablet (10 mg total) by mouth 3 (three) times daily. 270 tablet 3  . Multiple Vitamin (MULTI VITAMIN DAILY PO) Take 1 tablet by mouth daily.    . nitroGLYCERIN (NITRODUR - DOSED IN MG/24 HR) 0.2 mg/hr patch Place 0.2 mg onto the skin daily.    Marland Kitchen omeprazole (PRILOSEC) 10 MG capsule Take 10 mg by mouth daily.    Marland Kitchen oxyCODONE-acetaminophen (PERCOCET/ROXICET) 5-325 MG tablet Take 1-2 tablets by mouth every 6 (six) hours as needed for moderate pain. 30 tablet 0  . PERITONEAL DIALYSIS SOLUTIONS IP Inject 3,000 Units into the peritoneum 5 (five) times daily.     . potassium chloride SA (K-DUR,KLOR-CON) 20 MEQ tablet Take 20 mEq by mouth daily.      Allergies:  Allergies  Allergen Reactions  . Flu Virus Vaccine     Gets the flu, patient has bad reaction to the flu vaccine.  . Lipitor [Atorvastatin]     Leg cramps    Family History  Problem Relation Age of Onset  . COPD Mother   . COPD Father     Social History:  reports that he has never smoked. He has never used smokeless tobacco. He reports that he does not drink alcohol or use illicit drugs.  Review of Systems: negative except as above   Blood pressure 126/63, pulse 99, temperature 98.5 F (36.9 C), temperature source Oral, resp. rate 14,  height 5' 9" (1.753 m), weight 105.6 kg (232 lb 12.9 oz), SpO2 98 %. Head: Normocephalic, without obvious abnormality, atraumatic Neck: no adenopathy, no carotid bruit, no JVD, supple, symmetrical, trachea midline and thyroid not enlarged, symmetric, no tenderness/mass/nodules Resp: clear to auscultation bilaterally Cardio: regular rate and rhythm, S1, S2 normal, no murmur, click, rub or gallop GI: Soft nondistended with normoactive bowel sounds. No hepatomegaly masses or guarding. Extremities: extremities normal, atraumatic, no cyanosis or edema  Results for orders placed or performed during the hospital encounter of 05/18/15 (from the past 48 hour(s))  CBG monitoring, ED  Status: Abnormal   Collection Time: 05/18/15 10:42 AM  Result Value Ref Range   Glucose-Capillary 238 (H) 65 - 99 mg/dL  Comprehensive metabolic panel     Status: Abnormal   Collection Time: 05/18/15 11:38 AM  Result Value Ref Range   Sodium 130 (L) 135 - 145 mmol/L   Potassium 4.4 3.5 - 5.1 mmol/L   Chloride 91 (L) 101 - 111 mmol/L   CO2 23 22 - 32 mmol/L   Glucose, Bld 303 (H) 65 - 99 mg/dL   BUN 38 (H) 6 - 20 mg/dL   Creatinine, Ser 6.59 (H) 0.61 - 1.24 mg/dL   Calcium 9.5 8.9 - 10.3 mg/dL   Total Protein 7.4 6.5 - 8.1 g/dL   Albumin 2.6 (L) 3.5 - 5.0 g/dL   AST 38 15 - 41 U/L   ALT 18 17 - 63 U/L   Alkaline Phosphatase 155 (H) 38 - 126 U/L   Total Bilirubin 0.5 0.3 - 1.2 mg/dL   GFR calc non Af Amer 7 (L) >60 mL/min   GFR calc Af Amer 9 (L) >60 mL/min    Comment: (NOTE) The eGFR has been calculated using the CKD EPI equation. This calculation has not been validated in all clinical situations. eGFR's persistently <60 mL/min signify possible Chronic Kidney Disease.    Anion gap 16 (H) 5 - 15  CBC WITH DIFFERENTIAL     Status: Abnormal   Collection Time: 05/18/15 11:38 AM  Result Value Ref Range   WBC 9.7 4.0 - 10.5 K/uL   RBC 4.37 4.22 - 5.81 MIL/uL   Hemoglobin 14.3 13.0 - 17.0 g/dL   HCT 44.3  39.0 - 52.0 %   MCV 101.4 (H) 78.0 - 100.0 fL   MCH 32.7 26.0 - 34.0 pg   MCHC 32.3 30.0 - 36.0 g/dL   RDW 14.8 11.5 - 15.5 %   Platelets 365 150 - 400 K/uL   Neutrophils Relative % 75 %   Neutro Abs 7.3 1.7 - 7.7 K/uL   Lymphocytes Relative 16 %   Lymphs Abs 1.6 0.7 - 4.0 K/uL   Monocytes Relative 7 %   Monocytes Absolute 0.7 0.1 - 1.0 K/uL   Eosinophils Relative 1 %   Eosinophils Absolute 0.1 0.0 - 0.7 K/uL   Basophils Relative 1 %   Basophils Absolute 0.1 0.0 - 0.1 K/uL  Brain natriuretic peptide     Status: Abnormal   Collection Time: 05/18/15 11:39 AM  Result Value Ref Range   B Natriuretic Peptide 434.4 (H) 0.0 - 100.0 pg/mL  I-stat troponin, ED     Status: None   Collection Time: 05/18/15 11:40 AM  Result Value Ref Range   Troponin i, poc 0.04 0.00 - 0.08 ng/mL   Comment 3            Comment: Due to the release kinetics of cTnI, a negative result within the first hours of the onset of symptoms does not rule out myocardial infarction with certainty. If myocardial infarction is still suspected, repeat the test at appropriate intervals.   I-Stat CG4 Lactic Acid, ED  (not at  [facility])     Status: Abnormal   Collection Time: 05/18/15 11:42 AM  Result Value Ref Range   Lactic Acid, Venous 5.58 (HH) 0.5 - 2.0 mmol/L   Comment NOTIFIED PHYSICIAN   I-Stat CG4 Lactic Acid, ED  (not at  [facility])     Status: Abnormal   Collection Time: 05/18/15  2:00 PM  Result  Value Ref Range   Lactic Acid, Venous 4.19 (HH) 0.5 - 2.0 mmol/L   Comment NOTIFIED PHYSICIAN   Lactic acid, plasma     Status: Abnormal   Collection Time: 05/18/15  3:17 PM  Result Value Ref Range   Lactic Acid, Venous 3.5 (HH) 0.5 - 2.0 mmol/L    Comment: CRITICAL RESULT CALLED TO, READ BACK BY AND VERIFIED WITH: VIVERITO,S RN 05/18/15 1656 WOOTEN,K   Glucose, capillary     Status: Abnormal   Collection Time: 05/18/15  5:29 PM  Result Value Ref Range   Glucose-Capillary 276 (H) 65 - 99 mg/dL  Lactic acid, plasma      Status: Abnormal   Collection Time: 05/18/15  6:13 PM  Result Value Ref Range   Lactic Acid, Venous 4.1 (HH) 0.5 - 2.0 mmol/L    Comment: CRITICAL RESULT CALLED TO, READ BACK BY AND VERIFIED WITH: WELLINGTON,N RN 05/18/15 1921 WOOTEN,K   Hemoglobin and hematocrit, blood     Status: None   Collection Time: 05/18/15  6:13 PM  Result Value Ref Range   Hemoglobin 13.8 13.0 - 17.0 g/dL   HCT 41.7 39.0 - 52.0 %  Glucose, capillary     Status: Abnormal   Collection Time: 05/18/15  8:40 PM  Result Value Ref Range   Glucose-Capillary 188 (H) 65 - 99 mg/dL  CBC     Status: Abnormal   Collection Time: 05/19/15  2:26 AM  Result Value Ref Range   WBC 7.7 4.0 - 10.5 K/uL   RBC 3.45 (L) 4.22 - 5.81 MIL/uL   Hemoglobin 11.1 (L) 13.0 - 17.0 g/dL   HCT 34.9 (L) 39.0 - 52.0 %   MCV 101.2 (H) 78.0 - 100.0 fL   MCH 32.2 26.0 - 34.0 pg   MCHC 31.8 30.0 - 36.0 g/dL   RDW 15.3 11.5 - 15.5 %   Platelets 359 150 - 400 K/uL  Comprehensive metabolic panel     Status: Abnormal   Collection Time: 05/19/15  2:26 AM  Result Value Ref Range   Sodium 136 135 - 145 mmol/L   Potassium 4.3 3.5 - 5.1 mmol/L   Chloride 98 (L) 101 - 111 mmol/L   CO2 28 22 - 32 mmol/L   Glucose, Bld 191 (H) 65 - 99 mg/dL   BUN 36 (H) 6 - 20 mg/dL   Creatinine, Ser 6.11 (H) 0.61 - 1.24 mg/dL   Calcium 8.1 (L) 8.9 - 10.3 mg/dL   Total Protein 5.4 (L) 6.5 - 8.1 g/dL   Albumin 1.9 (L) 3.5 - 5.0 g/dL   AST 24 15 - 41 U/L   ALT 14 (L) 17 - 63 U/L   Alkaline Phosphatase 107 38 - 126 U/L   Total Bilirubin 0.7 0.3 - 1.2 mg/dL   GFR calc non Af Amer 8 (L) >60 mL/min   GFR calc Af Amer 9 (L) >60 mL/min    Comment: (NOTE) The eGFR has been calculated using the CKD EPI equation. This calculation has not been validated in all clinical situations. eGFR's persistently <60 mL/min signify possible Chronic Kidney Disease.    Anion gap 10 5 - 15  Glucose, capillary     Status: Abnormal   Collection Time: 05/19/15  7:18 AM  Result Value  Ref Range   Glucose-Capillary 159 (H) 65 - 99 mg/dL  Hemoglobin and hematocrit, blood     Status: Abnormal   Collection Time: 05/19/15  9:10 AM  Result Value Ref Range   Hemoglobin 11.6 (  L) 13.0 - 17.0 g/dL   HCT 37.3 (L) 39.0 - 52.0 %   Dg Chest Port 1 View  05/18/2015  CLINICAL DATA:  Shortness of breath and weakness. Chronic renal failure. EXAM: PORTABLE CHEST 1 VIEW COMPARISON:  April 23, 2015 FINDINGS: There is no edema or consolidation. The heart size is upper normal with pulmonary vascularity within normal limits. No adenopathy. There is atherosclerotic calcification in the aortic arch. No bone lesions. A sternal wire is noted, stable. IMPRESSION: No edema or consolidation. Electronically Signed   By: Lowella Grip III M.D.   On: 05/18/2015 11:08   Dg Abd Portable 1v  05/18/2015  CLINICAL DATA:  Abdominal pain EXAM: PORTABLE ABDOMEN - 1 VIEW COMPARISON:  None. FINDINGS: Scattered large and small bowel gas is noted. Fecal material is noted throughout the colon. A dialysis catheter is noted within the deep pelvis. No free air is seen. Mild degenerative changes of the lumbar spine are noted. IMPRESSION: No acute abnormality seen Electronically Signed   By: Inez Catalina M.D.   On: 05/18/2015 16:52   Dg Foot Complete Left  05/18/2015  CLINICAL DATA:  First toe amputation 2 weeks ago with discoloration at the surgical site EXAM: LEFT FOOT - COMPLETE 3+ VIEW COMPARISON:  04/23/2015 FINDINGS: There is been surgical excision of the first toe and first metatarsal. No definitive bony erosion to suggest osteomyelitis is noted. No gross soft tissue abnormality is seen. Diffuse vascular calcifications are seen. IMPRESSION: Postsurgical changes without acute abnormality. Electronically Signed   By: Inez Catalina M.D.   On: 05/18/2015 12:04    Assessment: 1. Acute nausea vomiting and reported coffee-ground emesis with stable hemoglobin 2. Preceding dysphagia and odynophagia, scheduled for  outpatient EGD 3. Diabetic foot infection with presumed sepsis.  Plan:  1. Will plan EGD with possible dilatation tomorrow morning assuming medically stable. 2. Empiric PPI treatment acutely. HAYES,JOHN C 05/19/2015, 9:54 AM  Pager [phone] If no answer or after 5 PM call [phone]

## 2015-05-19 NOTE — Consult Note (Signed)
Reason for Consult: Ischemic gangrenous changes left foot first ray amputation. Referring Physician: Dr. Ellwood Handler is an 74 y.o. male.  HPI: Patient is status post left foot first ray amputation with ischemic changes.  Patient states "'I don't want to lose my foot. I swore to myself I will not lose my foot."  Past Medical History  Diagnosis Date  . Hypertension   . Diabetes mellitus   . Hypothyroidism   . CHF (congestive heart failure) ([place])   . Anemia   . Blind left eye   . Sleep apnea     not on CPAP  . Coronary artery disease     CABG x 11 Jun 2009.  MRSA infections of incsions  . Arthritis   . Sarcoidosis ([place])     Hx:of  . Diabetic retinopathy ([place])     Hx: of bilateral  . Edema   . Hyperlipidemia   . 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 Atherosclerosis of native arteries of the extremities, unspecified   . Eczema     Hx: of  . Insomnia   . Seasonal allergies   . ESRD on dialysis [facility])     Was on dialysis in July 2011 and then stopped and restarted May 2014. He then transitioned to PD in Aug 2014. He does PD on cycler when at home and CAPD 6 exchanges per day when travelling. His wife is "in charge" and does the exchanges.   . History of blood transfusion     with heart surgery  . Coronary atherosclerosis of native coronary artery   . Atherosclerosis of native arteries of the extremities, unspecified     Past Surgical History  Procedure Laterality Date  . Cardiac surgery      total of 6 surgeries, 5 related to mrsa  . Coronary artery bypass graft  06/2009  . Cataract surgery      Hx: of  . Debridements      Hx: of secondary to MRSA  . Av fistula placement Right 10/11/2012    Procedure: ARTERIOVENOUS (AV) FISTULA CREATION;  Surgeon: Rosetta Posner, MD;  Location: [place];  Service: Vascular;  Laterality: Right;  . Insertion of dialysis catheter Left   . Capd insertion N/A 11/28/2012    Procedure: LAPAROSCOPIC PERITONEAL  DIALYSIS CATHETER PLACEMENT;  Surgeon: Ralene Ok, MD;  Location: [place];  Service: General;  Laterality: N/A;  . Left heart catheterization with coronary/graft angiogram N/A 01/31/2014    Procedure: LEFT HEART CATHETERIZATION WITH Beatrix Fetters;  Surgeon: Troy Sine, MD;  Location: [facility] CATH LAB;  Service: Cardiovascular;  Laterality: N/A;  . Peripheral vascular catheterization N/A 04/28/2015    Procedure: Abdominal Aortogram w/Lower Extremity;  Surgeon: Conrad Superior, MD;  Location: [place] CV LAB;  Service: Cardiovascular;  Laterality: N/A;  . Amputation Left 04/30/2015    Procedure: 1st Ray Amputation Left Foot;  Surgeon: Newt Minion, MD;  Location: [place];  Service: Orthopedics;  Laterality: Left;    Family History  Problem Relation Age of Onset  . COPD Mother   . COPD Father     Social History:  reports that he has never smoked. He has never used smokeless tobacco. He reports that he does not drink alcohol or use illicit drugs.  Allergies:  Allergies  Allergen Reactions  . Flu Virus Vaccine     Gets the flu, patient has bad reaction to the flu vaccine.  . Lipitor [Atorvastatin]  Leg cramps    Medications: I have reviewed the patient's current medications.  Results for orders placed or performed during the hospital encounter of 05/18/15 (from the past 48 hour(s))  CBG monitoring, ED     Status: Abnormal   Collection Time: 05/18/15 10:42 AM  Result Value Ref Range   Glucose-Capillary 238 (H) 65 - 99 mg/dL  Comprehensive metabolic panel     Status: Abnormal   Collection Time: 05/18/15 11:38 AM  Result Value Ref Range   Sodium 130 (L) 135 - 145 mmol/L   Potassium 4.4 3.5 - 5.1 mmol/L   Chloride 91 (L) 101 - 111 mmol/L   CO2 23 22 - 32 mmol/L   Glucose, Bld 303 (H) 65 - 99 mg/dL   BUN 38 (H) 6 - 20 mg/dL   Creatinine, Ser 6.59 (H) 0.61 - 1.24 mg/dL   Calcium 9.5 8.9 - 10.3 mg/dL   Total Protein 7.4 6.5 - 8.1 g/dL   Albumin 2.6 (L) 3.5 - 5.0 g/dL    AST 38 15 - 41 U/L   ALT 18 17 - 63 U/L   Alkaline Phosphatase 155 (H) 38 - 126 U/L   Total Bilirubin 0.5 0.3 - 1.2 mg/dL   GFR calc non Af Amer 7 (L) >60 mL/min   GFR calc Af Amer 9 (L) >60 mL/min    Comment: (NOTE) The eGFR has been calculated using the CKD EPI equation. This calculation has not been validated in all clinical situations. eGFR's persistently <60 mL/min signify possible Chronic Kidney Disease.    Anion gap 16 (H) 5 - 15  CBC WITH DIFFERENTIAL     Status: Abnormal   Collection Time: 05/18/15 11:38 AM  Result Value Ref Range   WBC 9.7 4.0 - 10.5 K/uL   RBC 4.37 4.22 - 5.81 MIL/uL   Hemoglobin 14.3 13.0 - 17.0 g/dL   HCT 44.3 39.0 - 52.0 %   MCV 101.4 (H) 78.0 - 100.0 fL   MCH 32.7 26.0 - 34.0 pg   MCHC 32.3 30.0 - 36.0 g/dL   RDW 14.8 11.5 - 15.5 %   Platelets 365 150 - 400 K/uL   Neutrophils Relative % 75 %   Neutro Abs 7.3 1.7 - 7.7 K/uL   Lymphocytes Relative 16 %   Lymphs Abs 1.6 0.7 - 4.0 K/uL   Monocytes Relative 7 %   Monocytes Absolute 0.7 0.1 - 1.0 K/uL   Eosinophils Relative 1 %   Eosinophils Absolute 0.1 0.0 - 0.7 K/uL   Basophils Relative 1 %   Basophils Absolute 0.1 0.0 - 0.1 K/uL  Brain natriuretic peptide     Status: Abnormal   Collection Time: 05/18/15 11:39 AM  Result Value Ref Range   B Natriuretic Peptide 434.4 (H) 0.0 - 100.0 pg/mL  I-stat troponin, ED     Status: None   Collection Time: 05/18/15 11:40 AM  Result Value Ref Range   Troponin i, poc 0.04 0.00 - 0.08 ng/mL   Comment 3            Comment: Due to the release kinetics of cTnI, a negative result within the first hours of the onset of symptoms does not rule out myocardial infarction with certainty. If myocardial infarction is still suspected, repeat the test at appropriate intervals.   I-Stat CG4 Lactic Acid, ED  (not at  [facility])     Status: Abnormal   Collection Time: 05/18/15 11:42 AM  Result Value Ref Range   Lactic Acid, Venous  5.58 (HH) 0.5 - 2.0 mmol/L   Comment  NOTIFIED PHYSICIAN   I-Stat CG4 Lactic Acid, ED  (not at  [facility])     Status: Abnormal   Collection Time: 05/18/15  2:00 PM  Result Value Ref Range   Lactic Acid, Venous 4.19 (HH) 0.5 - 2.0 mmol/L   Comment NOTIFIED PHYSICIAN   Lactic acid, plasma     Status: Abnormal   Collection Time: 05/18/15  3:17 PM  Result Value Ref Range   Lactic Acid, Venous 3.5 (HH) 0.5 - 2.0 mmol/L    Comment: CRITICAL RESULT CALLED TO, READ BACK BY AND VERIFIED WITH: VIVERITO,S RN 05/18/15 1656 WOOTEN,K   Glucose, capillary     Status: Abnormal   Collection Time: 05/18/15  5:29 PM  Result Value Ref Range   Glucose-Capillary 276 (H) 65 - 99 mg/dL  Lactic acid, plasma     Status: Abnormal   Collection Time: 05/18/15  6:13 PM  Result Value Ref Range   Lactic Acid, Venous 4.1 (HH) 0.5 - 2.0 mmol/L    Comment: CRITICAL RESULT CALLED TO, READ BACK BY AND VERIFIED WITH: WELLINGTON,N RN 05/18/15 1921 WOOTEN,K   Hemoglobin and hematocrit, blood     Status: None   Collection Time: 05/18/15  6:13 PM  Result Value Ref Range   Hemoglobin 13.8 13.0 - 17.0 g/dL   HCT 41.7 39.0 - 52.0 %  Glucose, capillary     Status: Abnormal   Collection Time: 05/18/15  8:40 PM  Result Value Ref Range   Glucose-Capillary 188 (H) 65 - 99 mg/dL  CBC     Status: Abnormal   Collection Time: 05/19/15  2:26 AM  Result Value Ref Range   WBC 7.7 4.0 - 10.5 K/uL   RBC 3.45 (L) 4.22 - 5.81 MIL/uL   Hemoglobin 11.1 (L) 13.0 - 17.0 g/dL   HCT 34.9 (L) 39.0 - 52.0 %   MCV 101.2 (H) 78.0 - 100.0 fL   MCH 32.2 26.0 - 34.0 pg   MCHC 31.8 30.0 - 36.0 g/dL   RDW 15.3 11.5 - 15.5 %   Platelets 359 150 - 400 K/uL  Comprehensive metabolic panel     Status: Abnormal   Collection Time: 05/19/15  2:26 AM  Result Value Ref Range   Sodium 136 135 - 145 mmol/L   Potassium 4.3 3.5 - 5.1 mmol/L   Chloride 98 (L) 101 - 111 mmol/L   CO2 28 22 - 32 mmol/L   Glucose, Bld 191 (H) 65 - 99 mg/dL   BUN 36 (H) 6 - 20 mg/dL   Creatinine, Ser 6.11 (H) 0.61  - 1.24 mg/dL   Calcium 8.1 (L) 8.9 - 10.3 mg/dL   Total Protein 5.4 (L) 6.5 - 8.1 g/dL   Albumin 1.9 (L) 3.5 - 5.0 g/dL   AST 24 15 - 41 U/L   ALT 14 (L) 17 - 63 U/L   Alkaline Phosphatase 107 38 - 126 U/L   Total Bilirubin 0.7 0.3 - 1.2 mg/dL   GFR calc non Af Amer 8 (L) >60 mL/min   GFR calc Af Amer 9 (L) >60 mL/min    Comment: (NOTE) The eGFR has been calculated using the CKD EPI equation. This calculation has not been validated in all clinical situations. eGFR's persistently <60 mL/min signify possible Chronic Kidney Disease.    Anion gap 10 5 - 15    Dg Chest Port 1 View  05/18/2015  CLINICAL DATA:  Shortness of breath and weakness. Chronic  renal failure. EXAM: PORTABLE CHEST 1 VIEW COMPARISON:  April 23, 2015 FINDINGS: There is no edema or consolidation. The heart size is upper normal with pulmonary vascularity within normal limits. No adenopathy. There is atherosclerotic calcification in the aortic arch. No bone lesions. A sternal wire is noted, stable. IMPRESSION: No edema or consolidation. Electronically Signed   By: Lowella Grip III M.D.   On: 05/18/2015 11:08   Dg Abd Portable 1v  05/18/2015  CLINICAL DATA:  Abdominal pain EXAM: PORTABLE ABDOMEN - 1 VIEW COMPARISON:  None. FINDINGS: Scattered large and small bowel gas is noted. Fecal material is noted throughout the colon. A dialysis catheter is noted within the deep pelvis. No free air is seen. Mild degenerative changes of the lumbar spine are noted. IMPRESSION: No acute abnormality seen Electronically Signed   By: Inez Catalina M.D.   On: 05/18/2015 16:52   Dg Foot Complete Left  05/18/2015  CLINICAL DATA:  First toe amputation 2 weeks ago with discoloration at the surgical site EXAM: LEFT FOOT - COMPLETE 3+ VIEW COMPARISON:  04/23/2015 FINDINGS: There is been surgical excision of the first toe and first metatarsal. No definitive bony erosion to suggest osteomyelitis is noted. No gross soft tissue abnormality is seen.  Diffuse vascular calcifications are seen. IMPRESSION: Postsurgical changes without acute abnormality. Electronically Signed   By: Inez Catalina M.D.   On: 05/18/2015 12:04    Review of Systems  All other systems reviewed and are negative.  Blood pressure 106/61, pulse 99, temperature 98.5 F (36.9 C), temperature source Oral, resp. rate 19, height '5\' 9"'  (1.753 m), weight 105.6 kg (232 lb 12.9 oz), SpO2 93 %. Physical Exam On examination patient has serosanguineous drainage consistent with ischemic changes to the left foot. There is black eschar around the wound edges. The wound edges are gaped open about 1 mm there is no purulence there is some redness dorsally on the foot. Patient has no pain with palpation no abscess no odor no signs of gross infection. Assessment/Plan: Assessment: Ischemic changes left foot status post first ray amputation.  Plan: As per the patient wishes I feel its safe to continue with wound care. Patient most likely will require a transtibial amputation but patient will not consider this type of intervention at this time. Orders written for Santyl dressing changes I will follow-up in the office.  Mitesh Rosendahl V 05/19/2015, 7:15 AM

## 2015-05-19 NOTE — Progress Notes (Signed)
9 am scheduled CAPD was started at 10 am and completed at 12 noon.  Spoke with Dr. Jonnie Finner.  Will do the reaming treatments one hour earlier than scheduled and add one CAPD to make a total of 5 treatments today.

## 2015-05-19 NOTE — Progress Notes (Signed)
DC NTG patch on anterior of R foot per verbal order by Dr. Remus Blake. Continue to monitor pt and cycle BP.

## 2015-05-20 ENCOUNTER — Encounter (HOSPITAL_COMMUNITY)
Admission: EM | Disposition: A | Discharge status: Home Health | Payer: Self-pay | Source: Home / Self Care | Attending: Internal Medicine

## 2015-05-20 ENCOUNTER — Encounter (HOSPITAL_COMMUNITY): Payer: Self-pay

## 2015-05-20 DIAGNOSIS — K3184 Gastroparesis: Secondary | ICD-10-CM

## 2015-05-20 DIAGNOSIS — E1169 Type 2 diabetes mellitus with other specified complication: Secondary | ICD-10-CM

## 2015-05-20 DIAGNOSIS — N186 End stage renal disease: Secondary | ICD-10-CM

## 2015-05-20 DIAGNOSIS — I25810 Atherosclerosis of coronary artery bypass graft(s) without angina pectoris: Secondary | ICD-10-CM

## 2015-05-20 DIAGNOSIS — I441 Atrioventricular block, second degree: Secondary | ICD-10-CM

## 2015-05-20 DIAGNOSIS — R1310 Dysphagia, unspecified: Secondary | ICD-10-CM

## 2015-05-20 DIAGNOSIS — Z794 Long term (current) use of insulin: Secondary | ICD-10-CM

## 2015-05-20 DIAGNOSIS — I9589 Other hypotension: Secondary | ICD-10-CM

## 2015-05-20 DIAGNOSIS — A419 Sepsis, unspecified organism: Principal | ICD-10-CM

## 2015-05-20 DIAGNOSIS — E1121 Type 2 diabetes mellitus with diabetic nephropathy: Secondary | ICD-10-CM

## 2015-05-20 DIAGNOSIS — K920 Hematemesis: Secondary | ICD-10-CM

## 2015-05-20 DIAGNOSIS — D869 Sarcoidosis, unspecified: Secondary | ICD-10-CM

## 2015-05-20 DIAGNOSIS — Z992 Dependence on renal dialysis: Secondary | ICD-10-CM

## 2015-05-20 DIAGNOSIS — L089 Local infection of the skin and subcutaneous tissue, unspecified: Secondary | ICD-10-CM

## 2015-05-20 HISTORY — PX: ESOPHAGOGASTRODUODENOSCOPY: SHX5428

## 2015-05-20 LAB — URINALYSIS, ROUTINE W REFLEX MICROSCOPIC
GLUCOSE, UA: 250 mg/dL — AB
KETONES UR: 15 mg/dL — AB
NITRITE: NEGATIVE
PROTEIN: 30 mg/dL — AB
Specific Gravity, Urine: 1.022 (ref 1.005–1.030)
pH: 5 (ref 5.0–8.0)

## 2015-05-20 LAB — RENAL FUNCTION PANEL
ANION GAP: 11 (ref 5–15)
Albumin: 1.8 g/dL — ABNORMAL LOW (ref 3.5–5.0)
BUN: 33 mg/dL — ABNORMAL HIGH (ref 6–20)
CALCIUM: 7.8 mg/dL — AB (ref 8.9–10.3)
CHLORIDE: 96 mmol/L — AB (ref 101–111)
CO2: 26 mmol/L (ref 22–32)
Creatinine, Ser: 5.91 mg/dL — ABNORMAL HIGH (ref 0.61–1.24)
GFR calc non Af Amer: 9 mL/min — ABNORMAL LOW (ref >60)
GFR, EST AFRICAN AMERICAN: 10 mL/min — AB (ref >60)
GLUCOSE: 213 mg/dL — AB (ref 65–99)
POTASSIUM: 3.3 mmol/L — AB (ref 3.5–5.1)
Phosphorus: 4.1 mg/dL (ref 2.5–4.6)
Sodium: 133 mmol/L — ABNORMAL LOW (ref 135–145)

## 2015-05-20 LAB — MAGNESIUM
MAGNESIUM: 1.5 mg/dL — AB (ref 1.7–2.4)
Magnesium: 2 mg/dL (ref 1.7–2.4)

## 2015-05-20 LAB — CBC
HEMATOCRIT: 35.2 % — AB (ref 39.0–52.0)
HEMOGLOBIN: 11 g/dL — AB (ref 13.0–17.0)
MCH: 31.9 pg (ref 26.0–34.0)
MCHC: 31.3 g/dL (ref 30.0–36.0)
MCV: 102 fL — AB (ref 78.0–100.0)
Platelets: 317 10*3/uL (ref 150–400)
RBC: 3.45 MIL/uL — ABNORMAL LOW (ref 4.22–5.81)
RDW: 15.2 % (ref 11.5–15.5)
WBC: 8.3 10*3/uL (ref 4.0–10.5)

## 2015-05-20 LAB — URINE MICROSCOPIC-ADD ON

## 2015-05-20 LAB — GLUCOSE, CAPILLARY
GLUCOSE-CAPILLARY: 201 mg/dL — AB (ref 65–99)
GLUCOSE-CAPILLARY: 196 mg/dL — AB (ref 65–99)
GLUCOSE-CAPILLARY: 389 mg/dL — AB (ref 65–99)
Glucose-Capillary: 139 mg/dL — ABNORMAL HIGH (ref 65–99)
Glucose-Capillary: 169 mg/dL — ABNORMAL HIGH (ref 65–99)

## 2015-05-20 LAB — T4, FREE: FREE T4: 0.86 ng/dL (ref 0.61–1.12)

## 2015-05-20 LAB — TSH: TSH: 5.075 u[IU]/mL — AB (ref 0.350–4.500)

## 2015-05-20 LAB — POTASSIUM: Potassium: 4.2 mmol/L (ref 3.5–5.1)

## 2015-05-20 LAB — LACTIC ACID, PLASMA: Lactic Acid, Venous: 2 mmol/L (ref 0.5–2.0)

## 2015-05-20 SURGERY — EGD (ESOPHAGOGASTRODUODENOSCOPY)
Anesthesia: Moderate Sedation

## 2015-05-20 MED ORDER — MIDAZOLAM HCL 10 MG/2ML IJ SOLN
INTRAMUSCULAR | Status: DC | PRN
  Administered 2015-05-20: 1 mg via INTRAVENOUS
  Administered 2015-05-20 (×2): 2 mg via INTRAVENOUS

## 2015-05-20 MED ORDER — FLUCONAZOLE IN SODIUM CHLORIDE 200-0.9 MG/100ML-% IV SOLN
200.0000 mg | INTRAVENOUS | Status: DC
  Administered 2015-05-20 – 2015-05-21 (×2): 200 mg via INTRAVENOUS
  Filled 2015-05-20 (×2): qty 100

## 2015-05-20 MED ORDER — FENTANYL CITRATE (PF) 100 MCG/2ML IJ SOLN
INTRAMUSCULAR | Status: AC
  Filled 2015-05-20: qty 2

## 2015-05-20 MED ORDER — MIDAZOLAM HCL 5 MG/ML IJ SOLN
INTRAMUSCULAR | Status: AC
  Filled 2015-05-20: qty 2

## 2015-05-20 MED ORDER — INSULIN GLARGINE 100 UNIT/ML ~~LOC~~ SOLN
10.0000 [IU] | Freq: Every day | SUBCUTANEOUS | Status: DC
  Administered 2015-05-20 – 2015-05-21 (×2): 10 [IU] via SUBCUTANEOUS
  Filled 2015-05-20 (×2): qty 0.1

## 2015-05-20 MED ORDER — FENTANYL CITRATE (PF) 100 MCG/2ML IJ SOLN
INTRAMUSCULAR | Status: DC | PRN
  Administered 2015-05-20 (×2): 25 ug via INTRAVENOUS

## 2015-05-20 MED ORDER — SODIUM CHLORIDE 0.9 % IV SOLN
INTRAVENOUS | Status: DC
  Administered 2015-05-20 (×2): via INTRAVENOUS

## 2015-05-20 MED ORDER — DIPHENHYDRAMINE HCL 50 MG/ML IJ SOLN
INTRAMUSCULAR | Status: AC
  Filled 2015-05-20: qty 1

## 2015-05-20 MED ORDER — GENTAMICIN SULFATE 0.1 % EX CREA
TOPICAL_CREAM | Freq: Every day | CUTANEOUS | Status: DC
  Administered 2015-05-20: 22:00:00 via TOPICAL
  Filled 2015-05-20: qty 15

## 2015-05-20 MED ORDER — POTASSIUM CHLORIDE CRYS ER 20 MEQ PO TBCR
40.0000 meq | EXTENDED_RELEASE_TABLET | Freq: Once | ORAL | Status: AC
  Administered 2015-05-20: 40 meq via ORAL
  Filled 2015-05-20: qty 2

## 2015-05-20 MED ORDER — MAGNESIUM SULFATE 2 GM/50ML IV SOLN
2.0000 g | Freq: Once | INTRAVENOUS | Status: AC
  Administered 2015-05-20: 2 g via INTRAVENOUS
  Filled 2015-05-20: qty 50

## 2015-05-20 MED ORDER — RISAQUAD PO CAPS
2.0000 | ORAL_CAPSULE | Freq: Every day | ORAL | Status: DC
  Administered 2015-05-20 – 2015-05-21 (×2): 2 via ORAL
  Filled 2015-05-20: qty 2

## 2015-05-20 NOTE — Progress Notes (Signed)
05/20/2015 10:03 AM  Drained patient and left empty for EGD scheduled this am.  Drained approximately 3900cc of clear yellow fluid off of patient.  Dr. Jonnie Finner made aware of patient's procedure and that patient was left dry in anticipation of procedure.  Will resume normal exchanges once patient returns from EGD.  Princella Pellegrini

## 2015-05-20 NOTE — Progress Notes (Signed)
05/20/2015 1:17 PM  PD schedule adjusted for today to accomodate EGD this am.  Only four exchanges to be done today per Dr. Jonnie Finner.  Resume normal orders (5 exchanges/day)  tomorrow, 12/14.   Princella Pellegrini

## 2015-05-20 NOTE — H&P (View-Only) (Signed)
[facility] Consult Note  Referring Provider: No ref. provider found Primary Care Physician:  [name], MD Primary Gastroenterologist:  Dr.  Laurel Dimmer Complaint: Nausea vomiting and epigastric pain. Previous dysphagia without dysphagia. HPI: Jack Chung is an 74 y.o. white male  admitted yesterday with coffee-ground emesis and worsening left foot infection felt to be septic based on lactic acidosis. He had been seen by Dr. Paulita Fujita as an outpatient for proceeding dysphagia and odynophagia and was scheduled for endoscopy this week. He is feeling somewhat better this morning after hydration and antibiotics and tolerated some clear liquid diet.  Past Medical History  Diagnosis Date  . Hypertension   . Diabetes mellitus   . Hypothyroidism   . CHF (congestive heart failure) ([place])   . Anemia   . Blind left eye   . Sleep apnea     not on CPAP  . Coronary artery disease     CABG x 11 Jun 2009.  MRSA infections of incsions  . Arthritis   . Sarcoidosis ([place])     Hx:of  . Diabetic retinopathy ([place])     Hx: of bilateral  . Edema   . Hyperlipidemia   . 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 Atherosclerosis of native arteries of the extremities, unspecified   . Eczema     Hx: of  . Insomnia   . Seasonal allergies   . ESRD on dialysis [facility])     Was on dialysis in July 2011 and then stopped and restarted May 2014. He then transitioned to PD in Aug 2014. He does PD on cycler when at home and CAPD 6 exchanges per day when travelling. His wife is "in charge" and does the exchanges.   . History of blood transfusion     with heart surgery  . Coronary atherosclerosis of native coronary artery   . Atherosclerosis of native arteries of the extremities, unspecified     Past Surgical History  Procedure Laterality Date  . Cardiac surgery      total of 6 surgeries, 5 related to mrsa  . Coronary artery bypass graft  06/2009  . Cataract surgery      Hx: of   . Debridements      Hx: of secondary to MRSA  . Av fistula placement Right 10/11/2012    Procedure: ARTERIOVENOUS (AV) FISTULA CREATION;  Surgeon: Rosetta Posner, MD;  Location: [place];  Service: Vascular;  Laterality: Right;  . Insertion of dialysis catheter Left   . Capd insertion N/A 11/28/2012    Procedure: LAPAROSCOPIC PERITONEAL DIALYSIS CATHETER PLACEMENT;  Surgeon: Ralene Ok, MD;  Location: [place];  Service: General;  Laterality: N/A;  . Left heart catheterization with coronary/graft angiogram N/A 01/31/2014    Procedure: LEFT HEART CATHETERIZATION WITH Beatrix Fetters;  Surgeon: Troy Sine, MD;  Location: [facility] CATH LAB;  Service: Cardiovascular;  Laterality: N/A;  . Peripheral vascular catheterization N/A 04/28/2015    Procedure: Abdominal Aortogram w/Lower Extremity;  Surgeon: Conrad Montezuma, MD;  Location: [place] CV LAB;  Service: Cardiovascular;  Laterality: N/A;  . Amputation Left 04/30/2015    Procedure: 1st Ray Amputation Left Foot;  Surgeon: Newt Minion, MD;  Location: [place];  Service: Orthopedics;  Laterality: Left;    Medications Prior to Admission  Medication Sig Dispense Refill  . allopurinol (ZYLOPRIM) 100 MG tablet Take 100 mg by mouth daily.    Marland Kitchen aspirin 325 MG tablet Take 325 mg by mouth daily.    Marland Kitchen  calcium carbonate (TUMS - DOSED IN MG ELEMENTAL CALCIUM) 500 MG chewable tablet Chew 2 tablets by mouth 3 (three) times daily with meals.     . cetirizine (ZYRTEC) 10 MG tablet Take 10 mg by mouth daily.    Marland Kitchen doxycycline (VIBRA-TABS) 100 MG tablet Take 1 tablet (100 mg total) by mouth 2 (two) times daily. 10 tablet 0  . gentamicin cream (GARAMYCIN) 0.1 % Apply 1 application topically at bedtime.    . insulin aspart (NOVOLOG) 100 UNIT/ML injection Inject 10-15 Units into the skin 3 (three) times daily with meals. Take none if blood sugar < 150, 151-200 2 units, 201-250 4 units, 251-300 6 units, 301-350 8 units    . Insulin Glargine (LANTUS) 100 UNIT/ML  Solostar Pen Inject 45 units into the skin daily. Dx 250.00 (Patient taking differently: 40 Units by Subconjunctival route at bedtime. ) 15 mL 6  . lactulose (CHRONULAC) 10 GM/15ML solution Take 10 g by mouth every 4 (four) hours as needed for mild constipation.    Marland Kitchen levothyroxine (SYNTHROID, LEVOTHROID) 112 MCG tablet LABS/APPOINTMENT OVERDUE, 1 by mouth daily in the morning 30 minutes before breakfast for Thyroid 15 tablet 0  . loperamide (IMODIUM) 2 MG capsule Take 2 mg by mouth daily as needed for diarrhea or loose stools.    . metoCLOPramide (REGLAN) 5 MG tablet Take 2 tablets (10 mg total) by mouth 3 (three) times daily before meals. 180 tablet 0  . midodrine (PROAMATINE) 10 MG tablet Take 1 tablet (10 mg total) by mouth 3 (three) times daily. 270 tablet 3  . Multiple Vitamin (MULTI VITAMIN DAILY PO) Take 1 tablet by mouth daily.    . nitroGLYCERIN (NITRODUR - DOSED IN MG/24 HR) 0.2 mg/hr patch Place 0.2 mg onto the skin daily.    Marland Kitchen omeprazole (PRILOSEC) 10 MG capsule Take 10 mg by mouth daily.    Marland Kitchen oxyCODONE-acetaminophen (PERCOCET/ROXICET) 5-325 MG tablet Take 1-2 tablets by mouth every 6 (six) hours as needed for moderate pain. 30 tablet 0  . PERITONEAL DIALYSIS SOLUTIONS IP Inject 3,000 Units into the peritoneum 5 (five) times daily.     . potassium chloride SA (K-DUR,KLOR-CON) 20 MEQ tablet Take 20 mEq by mouth daily.      Allergies:  Allergies  Allergen Reactions  . Flu Virus Vaccine     Gets the flu, patient has bad reaction to the flu vaccine.  . Lipitor [Atorvastatin]     Leg cramps    Family History  Problem Relation Age of Onset  . COPD Mother   . COPD Father     Social History:  reports that he has never smoked. He has never used smokeless tobacco. He reports that he does not drink alcohol or use illicit drugs.  Review of Systems: negative except as above   Blood pressure 126/63, pulse 99, temperature 98.5 F (36.9 C), temperature source Oral, resp. rate 14,  height 5' 9" (1.753 m), weight 105.6 kg (232 lb 12.9 oz), SpO2 98 %. Head: Normocephalic, without obvious abnormality, atraumatic Neck: no adenopathy, no carotid bruit, no JVD, supple, symmetrical, trachea midline and thyroid not enlarged, symmetric, no tenderness/mass/nodules Resp: clear to auscultation bilaterally Cardio: regular rate and rhythm, S1, S2 normal, no murmur, click, rub or gallop GI: Soft nondistended with normoactive bowel sounds. No hepatomegaly masses or guarding. Extremities: extremities normal, atraumatic, no cyanosis or edema  Results for orders placed or performed during the hospital encounter of 05/18/15 (from the past 48 hour(s))  CBG monitoring, ED  Status: Abnormal   Collection Time: 05/18/15 10:42 AM  Result Value Ref Range   Glucose-Capillary 238 (H) 65 - 99 mg/dL  Comprehensive metabolic panel     Status: Abnormal   Collection Time: 05/18/15 11:38 AM  Result Value Ref Range   Sodium 130 (L) 135 - 145 mmol/L   Potassium 4.4 3.5 - 5.1 mmol/L   Chloride 91 (L) 101 - 111 mmol/L   CO2 23 22 - 32 mmol/L   Glucose, Bld 303 (H) 65 - 99 mg/dL   BUN 38 (H) 6 - 20 mg/dL   Creatinine, Ser 6.59 (H) 0.61 - 1.24 mg/dL   Calcium 9.5 8.9 - 10.3 mg/dL   Total Protein 7.4 6.5 - 8.1 g/dL   Albumin 2.6 (L) 3.5 - 5.0 g/dL   AST 38 15 - 41 U/L   ALT 18 17 - 63 U/L   Alkaline Phosphatase 155 (H) 38 - 126 U/L   Total Bilirubin 0.5 0.3 - 1.2 mg/dL   GFR calc non Af Amer 7 (L) >60 mL/min   GFR calc Af Amer 9 (L) >60 mL/min    Comment: (NOTE) The eGFR has been calculated using the CKD EPI equation. This calculation has not been validated in all clinical situations. eGFR's persistently <60 mL/min signify possible Chronic Kidney Disease.    Anion gap 16 (H) 5 - 15  CBC WITH DIFFERENTIAL     Status: Abnormal   Collection Time: 05/18/15 11:38 AM  Result Value Ref Range   WBC 9.7 4.0 - 10.5 K/uL   RBC 4.37 4.22 - 5.81 MIL/uL   Hemoglobin 14.3 13.0 - 17.0 g/dL   HCT 44.3  39.0 - 52.0 %   MCV 101.4 (H) 78.0 - 100.0 fL   MCH 32.7 26.0 - 34.0 pg   MCHC 32.3 30.0 - 36.0 g/dL   RDW 14.8 11.5 - 15.5 %   Platelets 365 150 - 400 K/uL   Neutrophils Relative % 75 %   Neutro Abs 7.3 1.7 - 7.7 K/uL   Lymphocytes Relative 16 %   Lymphs Abs 1.6 0.7 - 4.0 K/uL   Monocytes Relative 7 %   Monocytes Absolute 0.7 0.1 - 1.0 K/uL   Eosinophils Relative 1 %   Eosinophils Absolute 0.1 0.0 - 0.7 K/uL   Basophils Relative 1 %   Basophils Absolute 0.1 0.0 - 0.1 K/uL  Brain natriuretic peptide     Status: Abnormal   Collection Time: 05/18/15 11:39 AM  Result Value Ref Range   B Natriuretic Peptide 434.4 (H) 0.0 - 100.0 pg/mL  I-stat troponin, ED     Status: None   Collection Time: 05/18/15 11:40 AM  Result Value Ref Range   Troponin i, poc 0.04 0.00 - 0.08 ng/mL   Comment 3            Comment: Due to the release kinetics of cTnI, a negative result within the first hours of the onset of symptoms does not rule out myocardial infarction with certainty. If myocardial infarction is still suspected, repeat the test at appropriate intervals.   I-Stat CG4 Lactic Acid, ED  (not at  [facility])     Status: Abnormal   Collection Time: 05/18/15 11:42 AM  Result Value Ref Range   Lactic Acid, Venous 5.58 (HH) 0.5 - 2.0 mmol/L   Comment NOTIFIED PHYSICIAN   I-Stat CG4 Lactic Acid, ED  (not at  [facility])     Status: Abnormal   Collection Time: 05/18/15  2:00 PM  Result  Value Ref Range   Lactic Acid, Venous 4.19 (HH) 0.5 - 2.0 mmol/L   Comment NOTIFIED PHYSICIAN   Lactic acid, plasma     Status: Abnormal   Collection Time: 05/18/15  3:17 PM  Result Value Ref Range   Lactic Acid, Venous 3.5 (HH) 0.5 - 2.0 mmol/L    Comment: CRITICAL RESULT CALLED TO, READ BACK BY AND VERIFIED WITH: VIVERITO,S RN 05/18/15 1656 WOOTEN,K   Glucose, capillary     Status: Abnormal   Collection Time: 05/18/15  5:29 PM  Result Value Ref Range   Glucose-Capillary 276 (H) 65 - 99 mg/dL  Lactic acid, plasma      Status: Abnormal   Collection Time: 05/18/15  6:13 PM  Result Value Ref Range   Lactic Acid, Venous 4.1 (HH) 0.5 - 2.0 mmol/L    Comment: CRITICAL RESULT CALLED TO, READ BACK BY AND VERIFIED WITH: WELLINGTON,N RN 05/18/15 1921 WOOTEN,K   Hemoglobin and hematocrit, blood     Status: None   Collection Time: 05/18/15  6:13 PM  Result Value Ref Range   Hemoglobin 13.8 13.0 - 17.0 g/dL   HCT 41.7 39.0 - 52.0 %  Glucose, capillary     Status: Abnormal   Collection Time: 05/18/15  8:40 PM  Result Value Ref Range   Glucose-Capillary 188 (H) 65 - 99 mg/dL  CBC     Status: Abnormal   Collection Time: 05/19/15  2:26 AM  Result Value Ref Range   WBC 7.7 4.0 - 10.5 K/uL   RBC 3.45 (L) 4.22 - 5.81 MIL/uL   Hemoglobin 11.1 (L) 13.0 - 17.0 g/dL   HCT 34.9 (L) 39.0 - 52.0 %   MCV 101.2 (H) 78.0 - 100.0 fL   MCH 32.2 26.0 - 34.0 pg   MCHC 31.8 30.0 - 36.0 g/dL   RDW 15.3 11.5 - 15.5 %   Platelets 359 150 - 400 K/uL  Comprehensive metabolic panel     Status: Abnormal   Collection Time: 05/19/15  2:26 AM  Result Value Ref Range   Sodium 136 135 - 145 mmol/L   Potassium 4.3 3.5 - 5.1 mmol/L   Chloride 98 (L) 101 - 111 mmol/L   CO2 28 22 - 32 mmol/L   Glucose, Bld 191 (H) 65 - 99 mg/dL   BUN 36 (H) 6 - 20 mg/dL   Creatinine, Ser 6.11 (H) 0.61 - 1.24 mg/dL   Calcium 8.1 (L) 8.9 - 10.3 mg/dL   Total Protein 5.4 (L) 6.5 - 8.1 g/dL   Albumin 1.9 (L) 3.5 - 5.0 g/dL   AST 24 15 - 41 U/L   ALT 14 (L) 17 - 63 U/L   Alkaline Phosphatase 107 38 - 126 U/L   Total Bilirubin 0.7 0.3 - 1.2 mg/dL   GFR calc non Af Amer 8 (L) >60 mL/min   GFR calc Af Amer 9 (L) >60 mL/min    Comment: (NOTE) The eGFR has been calculated using the CKD EPI equation. This calculation has not been validated in all clinical situations. eGFR's persistently <60 mL/min signify possible Chronic Kidney Disease.    Anion gap 10 5 - 15  Glucose, capillary     Status: Abnormal   Collection Time: 05/19/15  7:18 AM  Result Value  Ref Range   Glucose-Capillary 159 (H) 65 - 99 mg/dL  Hemoglobin and hematocrit, blood     Status: Abnormal   Collection Time: 05/19/15  9:10 AM  Result Value Ref Range   Hemoglobin 11.6 (  L) 13.0 - 17.0 g/dL   HCT 37.3 (L) 39.0 - 52.0 %   Dg Chest Port 1 View  05/18/2015  CLINICAL DATA:  Shortness of breath and weakness. Chronic renal failure. EXAM: PORTABLE CHEST 1 VIEW COMPARISON:  April 23, 2015 FINDINGS: There is no edema or consolidation. The heart size is upper normal with pulmonary vascularity within normal limits. No adenopathy. There is atherosclerotic calcification in the aortic arch. No bone lesions. A sternal wire is noted, stable. IMPRESSION: No edema or consolidation. Electronically Signed   By: William  Woodruff III M.D.   On: 05/18/2015 11:08   Dg Abd Portable 1v  05/18/2015  CLINICAL DATA:  Abdominal pain EXAM: PORTABLE ABDOMEN - 1 VIEW COMPARISON:  None. FINDINGS: Scattered large and small bowel gas is noted. Fecal material is noted throughout the colon. A dialysis catheter is noted within the deep pelvis. No free air is seen. Mild degenerative changes of the lumbar spine are noted. IMPRESSION: No acute abnormality seen Electronically Signed   By: Mark  Lukens M.D.   On: 05/18/2015 16:52   Dg Foot Complete Left  05/18/2015  CLINICAL DATA:  First toe amputation 2 weeks ago with discoloration at the surgical site EXAM: LEFT FOOT - COMPLETE 3+ VIEW COMPARISON:  04/23/2015 FINDINGS: There is been surgical excision of the first toe and first metatarsal. No definitive bony erosion to suggest osteomyelitis is noted. No gross soft tissue abnormality is seen. Diffuse vascular calcifications are seen. IMPRESSION: Postsurgical changes without acute abnormality. Electronically Signed   By: Mark  Lukens M.D.   On: 05/18/2015 12:04    Assessment: 1. Acute nausea vomiting and reported coffee-ground emesis with stable hemoglobin 2. Preceding dysphagia and odynophagia, scheduled for  outpatient EGD 3. Diabetic foot infection with presumed sepsis.  Plan:  1. Will plan EGD with possible dilatation tomorrow morning assuming medically stable. 2. Empiric PPI treatment acutely. Trameka Dorough C 05/19/2015, 9:54 AM  Pager [phone] If no answer or after 5 PM call [phone]   

## 2015-05-20 NOTE — Progress Notes (Signed)
[facility]  Patient Status: Active (receiving services up to time of hospitalization)  [facility] is providing the following services: RN and PT  If patient discharges after hours, please call [phone].   Jack Chung 05/20/2015, 4:03 PM

## 2015-05-20 NOTE — Progress Notes (Signed)
2300-Patient's HR continues to brady down to low 40's. Appears to be in a heart block with frequent PVC's. EKG performed. Second degree heart block confirmed. HR sustaining mid 40's to low 50's. Prior to this, patient has been in a first degree heart block. Patient resting with no complaints. K.Kirby, NP notified of rhythm change. Also notified of hypotension. Patient took HS dose of midodrine. No new orders at this time. Will notify NP as needed.   M.Forest Gleason, RN

## 2015-05-20 NOTE — Progress Notes (Signed)
[facility] Progress Note   Subjective: candida esoph on EGD. No new complaints. +nausea. "Good BM" last night  Filed Vitals:   05/20/15 1151 05/20/15 1200 05/20/15 1208 05/20/15 1233  BP: 108/51 96/42 95/43  120/59  Pulse: 60 72 66 67  Temp: 98.1 F (36.7 C)   97.2 F (36.2 C)  TempSrc: Oral   Oral  Resp: 18 16 18 17   Height:      Weight:      SpO2: 98% 98% 97% 98%    Inpatient medications: . acidophilus  2 capsule Oral Daily  . allopurinol  100 mg Oral Daily  . B-complex with vitamin C  1 tablet Oral BID  . calcium carbonate  2 tablet Oral TID WC  . collagenase   Topical Daily  . dialysis solution 1.5% low-MG/low-CA   Intraperitoneal 5 X Daily  . feeding supplement  1 Container Oral TID BM  . fluconazole (DIFLUCAN) IV  200 mg Intravenous Q24H  . insulin aspart  0-9 Units Subcutaneous TID WC  . insulin glargine  10 Units Subcutaneous Daily  . levothyroxine  112 mcg Oral QAC breakfast  . metoCLOPramide  10 mg Oral TID AC  . midodrine  10 mg Oral TID  . multivitamin  1 tablet Oral BID  . pantoprazole (PROTONIX) IV  40 mg Intravenous Q12H  . piperacillin-tazobactam (ZOSYN)  IV  2.25 g Intravenous Q8H   . sodium chloride     acetaminophen **OR** acetaminophen, fentaNYL (SUBLIMAZE) injection, heparin, lactulose, ondansetron **OR** ondansetron (ZOFRAN) IV, oxyCODONE  Exam: Elderly WM, no distress No jvd Chest clear bilat RRR distant HS no RG Abd soft ntnd PD cath LLQ clean exit site Ext RLE no edema/ wounds Left foot wrapped Neuro nf, Ox 3, calm  CAPD 5 exchanges per day (9-12-3-6-9p), 3000 cc fill vol, alternates 1.5% /2.5%. 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 Add probiotic   [name] MD [facility] pager [phone]    cell [phone] 05/20/2015, 4:44 PM    Recent Labs Lab 05/18/15 1138 05/19/15 0226 05/20/15 0311  NA 130* 136 133*  K 4.4 4.3 3.3*  CL 91* 98* 96*  CO2 23 28 26   GLUCOSE 303* 191* 213*  BUN 38* 36* 33*  CREATININE 6.59* 6.11* 5.91*  CALCIUM 9.5 8.1* 7.8*  PHOS  --   --  4.1    Recent Labs Lab 05/18/15 1138 05/19/15 0226 05/20/15 0311  AST 38 24  --   ALT 18 14*  --   ALKPHOS 155* 107  --   BILITOT 0.5 0.7  --   PROT 7.4 5.4*  --   ALBUMIN 2.6* 1.9* 1.8*    Recent Labs Lab 05/18/15 1138  05/19/15 0226 05/19/15 0910 05/20/15 0311  WBC 9.7  --  7.7  --  8.3  NEUTROABS 7.3  --   --   --   --   HGB 14.3  < > 11.1* 11.6* 11.0*  HCT 44.3  < >  34.9* 37.3* 35.2*  MCV 101.4*  --  101.2*  --  102.0*  PLT 365  --  359  --  317  < > = values in this interval not displayed.

## 2015-05-20 NOTE — Op Note (Signed)
[facility] [place], [address]   ENDOSCOPY PROCEDURE REPORT  PATIENT: Jack Chung, Jack Chung  MR#: [id number] BIRTHDATE: 03/26/1941 , 74  yrs. old GENDER: male ENDOSCOPIST: Wilford Corner, MD REFERRED BY:  hospital team PROCEDURE DATE:  06-19-15 PROCEDURE:  EGD, diagnostic ASA CLASS:     Class III INDICATIONS:  nausea, vomiting, and epigastric pain. MEDICATIONS: Fentanyl 50 mcg IV and Versed 5 mg IV TOPICAL ANESTHETIC: Cetacaine Spray  DESCRIPTION OF PROCEDURE: After the risks benefits and alternatives of the procedure were thoroughly explained, informed consent was obtained.  The Pentax Gastroscope [id number] endoscope was introduced through the mouth and advanced to the second portion of the duodenum , Without limitations.  The instrument was slowly withdrawn as the mucosa was fully examined. Estimated blood loss is zero unless otherwise noted in this procedure report.   Yellowish-white plaques and edema in the distal esophagus that look like Candida esophagitis - s/p brushing. No stricture, ring, or web seen. GEJ 40 cm from the incisors. Minimal antral erythema c/w antral gastritis. Duodenal bulb and 2nd portion of the duodenum normal in appearance.       Retroflexed views revealed no abnormalities.     The scope was then withdrawn from the patient and the procedure completed.  COMPLICATIONS: There were no immediate complications.  ENDOSCOPIC IMPRESSION:     Candida esophagitis (likely source of N/V) - s/p brushings Antral gastritis  RECOMMENDATIONS:     IV Diflucan; Clear liquid diet and advance as tolerated; F/U on brushings   eSigned:  Wilford Corner, MD 06-19-15 11:55 AM    CC:  CPT CODES: ICD CODES:  The ICD and CPT codes recommended by this software are interpretations from the data that the clinical staff has captured with the software.  The verification of the translation of this report to the ICD and CPT codes  and modifiers is the sole responsibility of the health care institution and practicing physician where this report was generated.  [employer] will not be held responsible for the validity of the ICD and CPT codes included on this report.  AMA assumes no liability for data contained or not contained herein. CPT is a Designer, television/film set of the [employer].  PATIENT NAME:  Jack Chung, Jack Chung MR#: [id number]

## 2015-05-20 NOTE — Consult Note (Signed)
Cardiologist: Hilty Reason for Consult: 2nd deg AVB Referring Physician: Jaxsin Bottomley is an 74 y.o. male.  HPI:  The patient is a 74yo male with a past medical history of CAD, CABG, obesity, insulin-dependent diabetes, sarcoidosis on prednisone, end-stage renal disease on peritoneal dialysis, as well as a history of hypertension and dyslipidemia. In 2011 apparently he was working on putting a roof on his house and came in later in the day. His wife found him apparently sitting by the side of his bed and he was blue, confused and short of breath. She called 911 and was taken to the emergency room. Ultimately his EKG demonstrated probably acute MI and he underwent cardiac catheterization which showed multivessel coronary artery disease. At that time there were no clear areas that would be amenable to percutaneous intervention. A surgical consult was obtained and per his report 3 different surgeons would not operate on him but eventually Dr. Roxanna Mew did. According to his operative report there was significant multivessel coronary disease. The LAD was heavily calcified throughout its length and closed 95% proximally. The mid and distal portion of the vessel had a lumen diameter 1.25 mm and were heavily calcified. The circumflex obtuse marginal branch was patent. The posterior lateral branch was a 2 mm vessel. The right coronary artery was totally occluded. The posterior descending artery was also a very small vessel about 1.5-2 mm. He underwent a three-vessel bypass and did fairly well although had a difficult recovery. 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   Mr. Akel  underwent nuclear stress test in Aug 2015 which was intermediate risk and showed partial reversibility concerning for ischemia. He then underwent cardiac catheterization which was performed by Dr. Claiborne Billings. This demonstrated the following findings:  IMPRESSION:  Severe native coronary artery disease with evidence for  coronary calcification and 95% proximal LAD stenosis between the first and second diagonal vessel with subtotal LAD occlusion beyond the second diagonal vessel; subtotal occlusion of the native circumflex vessel proximally with faint filling of a marginal branch prior to total mid occlusion, and total occlusion of the mid RCA. Patent LIMA graft supplying the mid LAD with diffuse 80% mid LAD stenoses and a small caliber vessel and 95% apical LAD stenosis. Sequential vein graft supplying the PDA branch of the RCA and a distal marginal-like branch of the left circumflex coronary artery.  Medical therapy was recommended. He was started on a nitrates.    He was seen by Dr Debara Pickett on Mar 11, 2015.  The patient's orthostatic issues resolved after being started on midodrine.  His last echocardiogram was 01/2015 and his EF was 55-60%, hypokinesis of the basalinferior myocardium, G1DD, LA mildly dilated.   The patient presented on 12/11 with recurrent N, V over three days and was found to be septic secondary to foot infection.  We are asked to see for 2nd degree AVB.   He underwent great toe amputation through the MTP on 11/23.  He had been on antibiotics prior to admission.  No acute findings on foot xray.  On Vanc and Zosyn.  He also had coffee-ground emesis and is undergoing and EGD.  His wife reported that he was so weak she could barely get him out of the bathtub and one time on Saturday he slumped to the floor but did not lose consciousness.  He did have hematemesis.   The patient currently denies nausea, vomiting, fever, chest pain, shortness of breath, orthopnea, dizziness, PND, cough, congestion,  abdominal pain, hematochezia, melena, lower extremity edema, claudication.    Past Medical History  Diagnosis Date  . Hypertension   . Diabetes mellitus   . Hypothyroidism   . CHF (congestive heart failure) ([place])   . Anemia   . Blind left eye   . Sleep apnea     not on CPAP  . Coronary artery disease      CABG x 11 Jun 2009.  MRSA infections of incsions  . Arthritis   . Sarcoidosis ([place])     Hx:of  . Diabetic retinopathy ([place])     Hx: of bilateral  . Edema   . Hyperlipidemia   . 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 Atherosclerosis of native arteries of the extremities, unspecified   . Eczema     Hx: of  . Insomnia   . Seasonal allergies   . ESRD on dialysis [facility])     Was on dialysis in July 2011 and then stopped and restarted May 2014. He then transitioned to PD in Aug 2014. He does PD on cycler when at home and CAPD 6 exchanges per day when travelling. His wife is "in charge" and does the exchanges.   . History of blood transfusion     with heart surgery  . Coronary atherosclerosis of native coronary artery   . Atherosclerosis of native arteries of the extremities, unspecified     Past Surgical History  Procedure Laterality Date  . Cardiac surgery      total of 6 surgeries, 5 related to mrsa  . Coronary artery bypass graft  06/2009  . Cataract surgery      Hx: of  . Debridements      Hx: of secondary to MRSA  . Av fistula placement Right 10/11/2012    Procedure: ARTERIOVENOUS (AV) FISTULA CREATION;  Surgeon: Rosetta Posner, MD;  Location: [place];  Service: Vascular;  Laterality: Right;  . Insertion of dialysis catheter Left   . Capd insertion N/A 11/28/2012    Procedure: LAPAROSCOPIC PERITONEAL DIALYSIS CATHETER PLACEMENT;  Surgeon: Ralene Ok, MD;  Location: [place];  Service: General;  Laterality: N/A;  . Left heart catheterization with coronary/graft angiogram N/A 01/31/2014    Procedure: LEFT HEART CATHETERIZATION WITH Beatrix Fetters;  Surgeon: Troy Sine, MD;  Location: [facility] CATH LAB;  Service: Cardiovascular;  Laterality: N/A;  . Peripheral vascular catheterization N/A 04/28/2015    Procedure: Abdominal Aortogram w/Lower Extremity;  Surgeon: Conrad Richfield, MD;  Location: [place] CV LAB;  Service: Cardiovascular;  Laterality: N/A;  .  Amputation Left 04/30/2015    Procedure: 1st Ray Amputation Left Foot;  Surgeon: Newt Minion, MD;  Location: [place];  Service: Orthopedics;  Laterality: Left;    Family History  Problem Relation Age of Onset  . COPD Mother   . COPD Father     Social History:  reports that he has never smoked. He has never used smokeless tobacco. He reports that he does not drink alcohol or use illicit drugs.  Allergies:  Allergies  Allergen Reactions  . Flu Virus Vaccine     Gets the flu, patient has bad reaction to the flu vaccine.  . Lipitor [Atorvastatin]     Leg cramps    Medications:  Scheduled Meds: . allopurinol  100 mg Oral Daily  . B-complex with vitamin C  1 tablet Oral BID  . calcium carbonate  2 tablet Oral TID WC  . collagenase   Topical  Daily  . dialysis solution 1.5% low-MG/low-CA   Intraperitoneal 5 X Daily  . feeding supplement  1 Container Oral TID BM  . insulin aspart  0-9 Units Subcutaneous TID WC  . insulin glargine  10 Units Subcutaneous Daily  . levothyroxine  112 mcg Oral QAC breakfast  . magnesium sulfate 1 - 4 g bolus IVPB  2 g Intravenous Once  . metoCLOPramide  10 mg Oral TID AC  . midodrine  10 mg Oral TID  . multivitamin  1 tablet Oral BID  . pantoprazole (PROTONIX) IV  40 mg Intravenous Q12H  . piperacillin-tazobactam (ZOSYN)  IV  2.25 g Intravenous Q8H  . potassium chloride  40 mEq Oral Once   Continuous Infusions:  PRN Meds:.acetaminophen **OR** acetaminophen, fentaNYL (SUBLIMAZE) injection, heparin, lactulose, ondansetron **OR** ondansetron (ZOFRAN) IV, oxyCODONE   Results for orders placed or performed during the hospital encounter of 05/18/15 (from the past 48 hour(s))  CBG monitoring, ED     Status: Abnormal   Collection Time: 05/18/15 10:42 AM  Result Value Ref Range   Glucose-Capillary 238 (H) 65 - 99 mg/dL  Blood Culture (routine x 2)     Status: None (Preliminary result)   Collection Time: 05/18/15 11:24 AM  Result Value Ref Range    Specimen Description LEFT ANTECUBITAL    Special Requests BOTTLES DRAWN AEROBIC AND ANAEROBIC 5CC    Culture NO GROWTH 1 DAY    Report Status PENDING   Blood Culture (routine x 2)     Status: None (Preliminary result)   Collection Time: 05/18/15 11:34 AM  Result Value Ref Range   Specimen Description BLOOD LEFT HAND    Special Requests PEDIATRICS 4CC    Culture NO GROWTH 1 DAY    Report Status PENDING   Comprehensive metabolic panel     Status: Abnormal   Collection Time: 05/18/15 11:38 AM  Result Value Ref Range   Sodium 130 (L) 135 - 145 mmol/L   Potassium 4.4 3.5 - 5.1 mmol/L   Chloride 91 (L) 101 - 111 mmol/L   CO2 23 22 - 32 mmol/L   Glucose, Bld 303 (H) 65 - 99 mg/dL   BUN 38 (H) 6 - 20 mg/dL   Creatinine, Ser 6.59 (H) 0.61 - 1.24 mg/dL   Calcium 9.5 8.9 - 10.3 mg/dL   Total Protein 7.4 6.5 - 8.1 g/dL   Albumin 2.6 (L) 3.5 - 5.0 g/dL   AST 38 15 - 41 U/L   ALT 18 17 - 63 U/L   Alkaline Phosphatase 155 (H) 38 - 126 U/L   Total Bilirubin 0.5 0.3 - 1.2 mg/dL   GFR calc non Af Amer 7 (L) >60 mL/min   GFR calc Af Amer 9 (L) >60 mL/min    Comment: (NOTE) The eGFR has been calculated using the CKD EPI equation. This calculation has not been validated in all clinical situations. eGFR's persistently <60 mL/min signify possible Chronic Kidney Disease.    Anion gap 16 (H) 5 - 15  CBC WITH DIFFERENTIAL     Status: Abnormal   Collection Time: 05/18/15 11:38 AM  Result Value Ref Range   WBC 9.7 4.0 - 10.5 K/uL   RBC 4.37 4.22 - 5.81 MIL/uL   Hemoglobin 14.3 13.0 - 17.0 g/dL   HCT 44.3 39.0 - 52.0 %   MCV 101.4 (H) 78.0 - 100.0 fL   MCH 32.7 26.0 - 34.0 pg   MCHC 32.3 30.0 - 36.0 g/dL   RDW 14.8 11.5 -  15.5 %   Platelets 365 150 - 400 K/uL   Neutrophils Relative % 75 %   Neutro Abs 7.3 1.7 - 7.7 K/uL   Lymphocytes Relative 16 %   Lymphs Abs 1.6 0.7 - 4.0 K/uL   Monocytes Relative 7 %   Monocytes Absolute 0.7 0.1 - 1.0 K/uL   Eosinophils Relative 1 %   Eosinophils  Absolute 0.1 0.0 - 0.7 K/uL   Basophils Relative 1 %   Basophils Absolute 0.1 0.0 - 0.1 K/uL  Brain natriuretic peptide     Status: Abnormal   Collection Time: 05/18/15 11:39 AM  Result Value Ref Range   B Natriuretic Peptide 434.4 (H) 0.0 - 100.0 pg/mL  I-stat troponin, ED     Status: None   Collection Time: 05/18/15 11:40 AM  Result Value Ref Range   Troponin i, poc 0.04 0.00 - 0.08 ng/mL   Comment 3            Comment: Due to the release kinetics of cTnI, a negative result within the first hours of the onset of symptoms does not rule out myocardial infarction with certainty. If myocardial infarction is still suspected, repeat the test at appropriate intervals.   I-Stat CG4 Lactic Acid, ED  (not at  [facility])     Status: Abnormal   Collection Time: 05/18/15 11:42 AM  Result Value Ref Range   Lactic Acid, Venous 5.58 (HH) 0.5 - 2.0 mmol/L   Comment NOTIFIED PHYSICIAN   I-Stat CG4 Lactic Acid, ED  (not at  [facility])     Status: Abnormal   Collection Time: 05/18/15  2:00 PM  Result Value Ref Range   Lactic Acid, Venous 4.19 (HH) 0.5 - 2.0 mmol/L   Comment NOTIFIED PHYSICIAN   Lactic acid, plasma     Status: Abnormal   Collection Time: 05/18/15  3:17 PM  Result Value Ref Range   Lactic Acid, Venous 3.5 (HH) 0.5 - 2.0 mmol/L    Comment: CRITICAL RESULT CALLED TO, READ BACK BY AND VERIFIED WITH: VIVERITO,S RN 05/18/15 1656 WOOTEN,K   Glucose, capillary     Status: Abnormal   Collection Time: 05/18/15  5:29 PM  Result Value Ref Range   Glucose-Capillary 276 (H) 65 - 99 mg/dL  Lactic acid, plasma     Status: Abnormal   Collection Time: 05/18/15  6:13 PM  Result Value Ref Range   Lactic Acid, Venous 4.1 (HH) 0.5 - 2.0 mmol/L    Comment: CRITICAL RESULT CALLED TO, READ BACK BY AND VERIFIED WITH: WELLINGTON,N RN 05/18/15 1921 WOOTEN,K   Hemoglobin and hematocrit, blood     Status: None   Collection Time: 05/18/15  6:13 PM  Result Value Ref Range   Hemoglobin 13.8 13.0 - 17.0 g/dL    HCT 41.7 39.0 - 52.0 %  Glucose, capillary     Status: Abnormal   Collection Time: 05/18/15  8:40 PM  Result Value Ref Range   Glucose-Capillary 188 (H) 65 - 99 mg/dL  CBC     Status: Abnormal   Collection Time: 05/19/15  2:26 AM  Result Value Ref Range   WBC 7.7 4.0 - 10.5 K/uL   RBC 3.45 (L) 4.22 - 5.81 MIL/uL   Hemoglobin 11.1 (L) 13.0 - 17.0 g/dL   HCT 34.9 (L) 39.0 - 52.0 %   MCV 101.2 (H) 78.0 - 100.0 fL   MCH 32.2 26.0 - 34.0 pg   MCHC 31.8 30.0 - 36.0 g/dL   RDW 15.3 11.5 - 15.5 %  Platelets 359 150 - 400 K/uL  Comprehensive metabolic panel     Status: Abnormal   Collection Time: 05/19/15  2:26 AM  Result Value Ref Range   Sodium 136 135 - 145 mmol/L   Potassium 4.3 3.5 - 5.1 mmol/L   Chloride 98 (L) 101 - 111 mmol/L   CO2 28 22 - 32 mmol/L   Glucose, Bld 191 (H) 65 - 99 mg/dL   BUN 36 (H) 6 - 20 mg/dL   Creatinine, Ser 6.11 (H) 0.61 - 1.24 mg/dL   Calcium 8.1 (L) 8.9 - 10.3 mg/dL   Total Protein 5.4 (L) 6.5 - 8.1 g/dL   Albumin 1.9 (L) 3.5 - 5.0 g/dL   AST 24 15 - 41 U/L   ALT 14 (L) 17 - 63 U/L   Alkaline Phosphatase 107 38 - 126 U/L   Total Bilirubin 0.7 0.3 - 1.2 mg/dL   GFR calc non Af Amer 8 (L) >60 mL/min   GFR calc Af Amer 9 (L) >60 mL/min    Comment: (NOTE) The eGFR has been calculated using the CKD EPI equation. This calculation has not been validated in all clinical situations. eGFR's persistently <60 mL/min signify possible Chronic Kidney Disease.    Anion gap 10 5 - 15  Glucose, capillary     Status: Abnormal   Collection Time: 05/19/15  7:18 AM  Result Value Ref Range   Glucose-Capillary 159 (H) 65 - 99 mg/dL  Hemoglobin and hematocrit, blood     Status: Abnormal   Collection Time: 05/19/15  9:10 AM  Result Value Ref Range   Hemoglobin 11.6 (L) 13.0 - 17.0 g/dL   HCT 37.3 (L) 39.0 - 52.0 %  Glucose, capillary     Status: Abnormal   Collection Time: 05/19/15  1:19 PM  Result Value Ref Range   Glucose-Capillary 140 (H) 65 - 99 mg/dL    Glucose, capillary     Status: Abnormal   Collection Time: 05/19/15  5:04 PM  Result Value Ref Range   Glucose-Capillary 244 (H) 65 - 99 mg/dL  Glucose, capillary     Status: Abnormal   Collection Time: 05/19/15  9:52 PM  Result Value Ref Range   Glucose-Capillary 181 (H) 65 - 99 mg/dL  CBC     Status: Abnormal   Collection Time: 05/20/15  3:11 AM  Result Value Ref Range   WBC 8.3 4.0 - 10.5 K/uL   RBC 3.45 (L) 4.22 - 5.81 MIL/uL   Hemoglobin 11.0 (L) 13.0 - 17.0 g/dL   HCT 35.2 (L) 39.0 - 52.0 %   MCV 102.0 (H) 78.0 - 100.0 fL   MCH 31.9 26.0 - 34.0 pg   MCHC 31.3 30.0 - 36.0 g/dL   RDW 15.2 11.5 - 15.5 %   Platelets 317 150 - 400 K/uL  Lactic acid, plasma     Status: None   Collection Time: 05/20/15  3:11 AM  Result Value Ref Range   Lactic Acid, Venous 2.0 0.5 - 2.0 mmol/L  Renal function panel     Status: Abnormal   Collection Time: 05/20/15  3:11 AM  Result Value Ref Range   Sodium 133 (L) 135 - 145 mmol/L   Potassium 3.3 (L) 3.5 - 5.1 mmol/L    Comment: DELTA CHECK NOTED   Chloride 96 (L) 101 - 111 mmol/L   CO2 26 22 - 32 mmol/L   Glucose, Bld 213 (H) 65 - 99 mg/dL   BUN 33 (H) 6 - 20 mg/dL  Creatinine, Ser 5.91 (H) 0.61 - 1.24 mg/dL   Calcium 7.8 (L) 8.9 - 10.3 mg/dL   Phosphorus 4.1 2.5 - 4.6 mg/dL   Albumin 1.8 (L) 3.5 - 5.0 g/dL   GFR calc non Af Amer 9 (L) >60 mL/min   GFR calc Af Amer 10 (L) >60 mL/min    Comment: (NOTE) The eGFR has been calculated using the CKD EPI equation. This calculation has not been validated in all clinical situations. eGFR's persistently <60 mL/min signify possible Chronic Kidney Disease.    Anion gap 11 5 - 15  Glucose, capillary     Status: Abnormal   Collection Time: 05/20/15  8:02 AM  Result Value Ref Range   Glucose-Capillary 201 (H) 65 - 99 mg/dL  Magnesium     Status: Abnormal   Collection Time: 05/20/15  8:45 AM  Result Value Ref Range   Magnesium 1.5 (L) 1.7 - 2.4 mg/dL    Dg Chest Port 1 View  05/18/2015   CLINICAL DATA:  Shortness of breath and weakness. Chronic renal failure. EXAM: PORTABLE CHEST 1 VIEW COMPARISON:  April 23, 2015 FINDINGS: There is no edema or consolidation. The heart size is upper normal with pulmonary vascularity within normal limits. No adenopathy. There is atherosclerotic calcification in the aortic arch. No bone lesions. A sternal wire is noted, stable. IMPRESSION: No edema or consolidation. Electronically Signed   By: Lowella Grip III M.D.   On: 05/18/2015 11:08   Dg Abd Portable 1v  05/18/2015  CLINICAL DATA:  Abdominal pain EXAM: PORTABLE ABDOMEN - 1 VIEW COMPARISON:  None. FINDINGS: Scattered large and small bowel gas is noted. Fecal material is noted throughout the colon. A dialysis catheter is noted within the deep pelvis. No free air is seen. Mild degenerative changes of the lumbar spine are noted. IMPRESSION: No acute abnormality seen Electronically Signed   By: Inez Catalina M.D.   On: 05/18/2015 16:52   Dg Foot Complete Left  05/18/2015  CLINICAL DATA:  First toe amputation 2 weeks ago with discoloration at the surgical site EXAM: LEFT FOOT - COMPLETE 3+ VIEW COMPARISON:  04/23/2015 FINDINGS: There is been surgical excision of the first toe and first metatarsal. No definitive bony erosion to suggest osteomyelitis is noted. No gross soft tissue abnormality is seen. Diffuse vascular calcifications are seen. IMPRESSION: Postsurgical changes without acute abnormality. Electronically Signed   By: Inez Catalina M.D.   On: 05/18/2015 12:04    Review of Systems  Constitutional: Negative for fever (Currently).  HENT: Negative for congestion and sore throat.   Respiratory: Negative for cough, shortness of breath and wheezing.   Cardiovascular: Negative for chest pain, palpitations, orthopnea, leg swelling and PND.  Gastrointestinal: Negative for nausea (Not currently), vomiting, abdominal pain, blood in stool and melena.  Genitourinary: Negative for hematuria.        +hematemisis   Musculoskeletal: Negative for myalgias.  Neurological: Positive for dizziness (when at home) and weakness.  All other systems reviewed and are negative.  Blood pressure 111/51, pulse 69, temperature 98.2 F (36.8 C), temperature source Oral, resp. rate 21, height '5\' 9"'  (1.753 m), weight 231 lb 11.2 oz (105.098 kg), SpO2 98 %. Physical Exam  Nursing note and vitals reviewed. Constitutional: He is oriented to person, place, and time. He appears well-developed and well-nourished. No distress.  Very weak.  HENT:  Head: Normocephalic and atraumatic.  Eyes: EOM are normal. Pupils are equal, round, and reactive to light.  Neck: Normal range of motion.  Neck supple. No JVD present.  Cardiovascular: Normal rate, regular rhythm, S1 normal and S2 normal.   No murmur heard. Pulses:      Radial pulses are 1+ on the right side, and 1+ on the left side.       Dorsalis pedis pulses are 1+ on the right side, and 1+ on the left side.  Muffled heart sounds  No carotid bruits  Respiratory: Effort normal and breath sounds normal. He has no wheezes. He has no rales.  GI: Soft. Bowel sounds are normal. He exhibits no distension.  Musculoskeletal: He exhibits no edema.  Lymphadenopathy:    He has no cervical adenopathy.  Neurological: He is alert and oriented to person, place, and time. He exhibits normal muscle tone.  Generalized weakness  Skin: Skin is warm and dry.  Psychiatric: He has a normal mood and affect.    Assessment/Plan: Principal Problem:   Diabetic foot infection (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   His last echocardiogram was 01/2015 and his EF was 55-60%, hypokinesis of the basalinferior myocardium, G1DD, LA mildly dilated.  He presented on 12/11 with recurrent N, V over three days and was found to be septic secondary to foot infection.   He is having intermittent second degree AVB type 1 and first deg AVB.  No AV nodal meds to stop.  He is on midodrine for BP.  Magnesium was 1.5 and K 3.3.  Both are being supplemented.  We'll recheck them both this evening.  No indication for PPM.  Continue to monitor.    SP endoscopy revealing antral gastritis and candida esophagitis.       Tarri Fuller, [place] 05/20/2015, 10:11 AM   I have examined the patient and reviewed assessment and plan and discussed with patient.  Agree with above as stated.  [place] 1 AV block.  No symptoms.  No angina.  Continue supportive care.  No indication for PPM.  F/u with [name].  Please call back with questions.  VARANASI,JAYADEEP S.

## 2015-05-20 NOTE — Progress Notes (Addendum)
[facility]                                                                              Patient Demographics  Jack Chung, is a 74 y.o. male, DOB - January 21, 1941, [id number]  Admit date - 05/18/2015   Admitting Physician Elmarie Shiley, MD  Outpatient Primary MD for the patient is [name], MD  LOS - 2   Chief Complaint  Patient presents with  . Weakness      HPI on 05/18/2015 by Dr. Niel Hummer Jack Chung is a 74 y.o. 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 Who presents with recurrent nausea, vomiting worse over last 3 days. Patient has had nausea and vomiting, dysphagia to liquids and solids for last 3 moths , worse over last 3 days. His wife report coffee ground emesis today. He started to have epigastric pain in the ED when he sat up in the bed. Last BM 2 days ago. He has not being able to keep down antibiotics prescribe by Dr [name] for foot infection.  Evaluation in the ED; patient afebrile, tachypnea, lactic acid at 5, foot x ray with post surgical changes, no acte finding.   Assessment & Plan   Sepsis secondary to diabetic foot infection -Upon admission, patient did have hypotension and lactic acidosis -[name], orthopedic surgery, consulted and appreciated. Patient declined amputation at this time, continue wound care along with Santyl dressing changes and IV antibiotics -Lactic acid has improved -Culture showed no growth to date -Continue vancomycin and Zosyn  ?Secondy degree heart block  -Patient has known first-degree AV block, repeat EKG did show AV block this morning -Overnight, patient supposedly had second-degree AV block -Magnesium obtained, 1.5, were placed -Cardiology consulted and appreciated -Echocardiogram 01/08/2015 showed EF of 0000000, grade 1 diastolic dysfunction  ESRD -Continue peritoneal dialysis -Nephrology consult appreciated  Coffee-ground emesis with odynophagia/history of  gastroparesis -Gastroenterology consulted and appreciated -Plan for EGD today -Continue Protonix, Reglan -Hemoglobin stable at 11 today  Acute on chronic hypotension -Blood pressure has been stable -Continue Midrin  Diabetes mellitus, type II -Will restart small dose of Lantus, 15 units today -Diabetes coordinator following and appreciated -Continue insulin sliding scale CBG monitoring  Hypokalemia/hypomagnesemia -Replace and continue to monitor BMP -We'll replace magnesium  Hypothyroidism -Continue Synthroid -TSH pending  Sarcoidosis -Stable  Code Status: Full  Family Communication: None at bedside  Disposition Plan: Admitted. Pending EGD and cardiology consult today  Time Spent in minutes   30 minutes  Procedures  EGD  Consults   Gastroenterology Nephrology Orthopedics Cardiology  DVT Prophylaxis  SCDs  Lab Results  Component Value Date   PLT 317 05/20/2015    Medications  Scheduled Meds: . allopurinol  100 mg Oral Daily  . B-complex with vitamin C  1 tablet Oral BID  . calcium carbonate  2 tablet Oral TID WC  . collagenase   Topical Daily  . dialysis solution 1.5% low-MG/low-CA   Intraperitoneal 5 X Daily  . feeding supplement  1 Container Oral TID BM  . insulin aspart  0-9 Units Subcutaneous TID WC  . insulin glargine  10 Units Subcutaneous Daily  .  levothyroxine  112 mcg Oral QAC breakfast  . metoCLOPramide  10 mg Oral TID AC  . midodrine  10 mg Oral TID  . multivitamin  1 tablet Oral BID  . pantoprazole (PROTONIX) IV  40 mg Intravenous Q12H  . piperacillin-tazobactam (ZOSYN)  IV  2.25 g Intravenous Q8H   Continuous Infusions:  PRN Meds:.acetaminophen **OR** acetaminophen, fentaNYL (SUBLIMAZE) injection, heparin, lactulose, ondansetron **OR** ondansetron (ZOFRAN) IV, oxyCODONE  Antibiotics    Anti-infectives    Start     Dose/Rate Route Frequency Ordered Stop   05/18/15 2100  piperacillin-tazobactam (ZOSYN) IVPB 2.25 g     2.25 g 100  mL/hr over 30 Minutes Intravenous Every 8 hours 05/18/15 1215     05/18/15 1400  vancomycin (VANCOCIN) 1,250 mg in sodium chloride 0.9 % 250 mL IVPB     1,250 mg 166.7 mL/hr over 90 Minutes Intravenous  Once 05/18/15 1212 05/18/15 1444   05/18/15 1215  vancomycin (VANCOCIN) IVPB 1000 mg/200 mL premix     1,000 mg 200 mL/hr over 60 Minutes Intravenous  Once 05/18/15 1202 05/18/15 1355   05/18/15 1215  piperacillin-tazobactam (ZOSYN) IVPB 3.375 g     3.375 g 100 mL/hr over 30 Minutes Intravenous  Once 05/18/15 1202 05/18/15 1329      Subjective:   Jack Chung seen and examined today.  Patient has no complaints today. Denies any chest pain, shortness of breath, palpitations, dizziness or headache. Wants to get his "EGD over with."  Objective:   Filed Vitals:   05/20/15 0300 05/20/15 0500 05/20/15 0700 05/20/15 0800  BP: 99/48 90/42 113/50 111/51  Pulse: 57 70 65 69  Temp:  98.4 F (36.9 C)  98.2 F (36.8 C)  TempSrc:  Oral  Oral  Resp: 21 19 16 21   Height:      Weight:  105.098 kg (231 lb 11.2 oz)    SpO2: 95% 96% 98% 98%    Wt Readings from Last 3 Encounters:  05/20/15 105.098 kg (231 lb 11.2 oz)  05/01/15 107.548 kg (237 lb 1.6 oz)  03/11/15 107.911 kg (237 lb 14.4 oz)     Intake/Output Summary (Last 24 hours) at 05/20/15 0945 Last data filed at 05/20/15 0900  Gross per 24 hour  Intake  12210 ml  Output  11602 ml  Net    608 ml    Exam  General: Well developed, well nourished, NAD, appears stated age  20: NCAT, mucous membranes moist.   Cardiovascular: S1 S2 auscultated, RRR, no murmurs  Respiratory: Clear to auscultation bilaterally   Abdomen: Soft, nontender, nondistended, + bowel sounds. Peritoneal catheter in place LLQ  Extremities: warm dry without cyanosis clubbing. Left foot wrapped  Neuro: AAOx3, nonfocal  Psych: Normal affect and demeanor   Data Review   Micro Results Recent Results (from the past 240 hour(s))  Blood Culture (routine x  2)     Status: None (Preliminary result)   Collection Time: 05/18/15 11:24 AM  Result Value Ref Range Status   Specimen Description LEFT ANTECUBITAL  Final   Special Requests BOTTLES DRAWN AEROBIC AND ANAEROBIC 5CC  Final   Culture NO GROWTH 1 DAY  Final   Report Status PENDING  Incomplete  Blood Culture (routine x 2)     Status: None (Preliminary result)   Collection Time: 05/18/15 11:34 AM  Result Value Ref Range Status   Specimen Description BLOOD LEFT HAND  Final   Special Requests PEDIATRICS 4CC  Final   Culture NO GROWTH 1 DAY  Final   Report Status PENDING  Incomplete    Radiology Reports Ct Abdomen Pelvis Wo Contrast  04/23/2015  CLINICAL DATA:  Subacute onset of postprandial vomiting. Initial encounter. EXAM: CT ABDOMEN AND PELVIS WITHOUT CONTRAST TECHNIQUE: Multidetector CT imaging of the abdomen and pelvis was performed following the standard protocol without IV contrast. COMPARISON:  CT of the abdomen and pelvis from 07/17/2013, and abdominal ultrasound performed 04/21/2015 FINDINGS: Scattered nodules at the right lung base demonstrate mild calcification and are likely benign. Wall thickening along the distal esophagus could reflect esophagitis. Would correlate for any associated symptoms. Diffuse coronary artery calcifications are seen. Small to moderate volume ascites is seen within the abdomen and pelvis. A peritoneal dialysis catheter is noted at the left hemipelvis. Mild vague soft tissue inflammation is noted within the mesentery, nonspecific in appearance. The liver and spleen are unremarkable in appearance. The gallbladder is within normal limits. The pancreas and adrenal glands are unremarkable. The kidneys are unremarkable in appearance. There is no evidence of hydronephrosis. No renal or ureteral stones are seen. Nonspecific perinephric stranding is noted bilaterally. No free fluid is identified. The small bowel is unremarkable in appearance. The stomach is within normal  limits. No acute vascular abnormalities are seen. Scattered calcification is noted along the abdominal aorta and its branches. The appendix is normal in caliber, without evidence of appendicitis. The colon is unremarkable in appearance. The bladder is relatively decompressed and grossly unremarkable in appearance. The prostate remains normal in size. No inguinal lymphadenopathy is seen. No acute osseous abnormalities are identified. IMPRESSION: 1. Mild wall thickening along the distal esophagus could reflect esophagitis. Would correlate for associated symptoms. 2. Small to moderate volume ascites within the abdomen and pelvis. Peritoneal dialysis catheter noted at the right hemipelvis. 3. Mild vague nonspecific soft tissue inflammation noted within the mesentery. 4. Scattered calcification along the abdominal aorta and its branches. 5. Diffuse coronary artery calcifications seen. 6. Scattered nodules at the right lung base demonstrate mild calcification and are likely benign. Electronically Signed   By: Garald Balding M.D.   On: 04/23/2015 19:31   Dg Chest 2 View  04/23/2015  CLINICAL DATA:  Acute onset of shortness of breath. Initial encounter. EXAM: CHEST  2 VIEW COMPARISON:  Chest radiograph from 12/30/2014 FINDINGS: The lungs are well-aerated and clear. There is no evidence of focal opacification, pleural effusion or pneumothorax. The heart is borderline enlarged. The patient is status post median sternotomy. No acute osseous abnormalities are seen. IMPRESSION: No acute cardiopulmonary process seen. Electronically Signed   By: Garald Balding M.D.   On: 04/23/2015 19:01   Nm Gastric Emptying  04/28/2015  CLINICAL DATA:  Recurrent vomiting. EXAM: NUCLEAR MEDICINE GASTRIC EMPTYING SCAN TECHNIQUE: After oral ingestion of radiolabeled meal, sequential abdominal images were obtained for 4 hours. Percentage of activity emptying the stomach was calculated at 1 hour, 2 hour, 3 hour, and 4 hours.  RADIOPHARMACEUTICALS:  2.0 mCi Tc-64m MDP labeled sulfur colloid orally COMPARISON:  None. FINDINGS: Expected location of the stomach in the left upper quadrant. Ingested meal empties the stomach gradually over the course of the study. 44% emptied at 1 hr ( normal >= 10%) 45% emptied at 2 hr ( normal >= 40%) 47% emptied at 3 hr ( normal >= 70%) 60% emptied at 4 hr ( normal >= 90%) IMPRESSION: Delayed gastric emptying study. Electronically Signed   By: Marijo Conception, M.D.   On: 04/28/2015 15:23   US Abdomen Complete  04/21/2015  CLINICAL  DATA:  NAUSEA AND VOMITING. Symptoms for 1 week. No reported pain. EXAM: ULTRASOUND ABDOMEN COMPLETE COMPARISON:  CT, 07/17/2013 FINDINGS: Gallbladder: No gallstones or wall thickening visualized. No sonographic Murphy sign noted. Common bile duct: Diameter: 3.8 mm Liver: Coarsened echotexture with no discrete mass or focal lesion. Normal in size. Hepatopetal flow documented in the portal vein. IVC: Obscured by midline bowel gas. Pancreas: Obscured by midline bowel gas. Spleen: Size and appearance within normal limits. Right Kidney: Length: 8.5 cm. Mild increased parenchymal echogenicity with cortical thinning. No mass, stone or hydronephrosis. Left Kidney: Length: 10.1 cm. Echogenicity within normal limits. No mass or hydronephrosis visualized. Abdominal aorta: Proximal and midportion obscured by bowel gas. Normal caliber distal aorta and iliac arteries. Other findings: Small to moderate amount of ascites. IMPRESSION: 1. No acute findings. 2. No gallstones.  No bile duct dilation. 3. Exam somewhat limited. Bowel gas obscures midline structures including the inferior vena cava, most of the aorta and the pancreas. 4. Right renal cortical thinning. Increased parenchymal echogenicity. This is consistent with medical renal disease. 5. Coarsened echotexture of the liver, nonspecific. 6. Small to moderate amount of ascites. Electronically Signed   By: Lajean Manes M.D.   On:  04/21/2015 15:38   Dg Esophagus  04/24/2015  CLINICAL DATA:  Dysphagia with lower chest pressure when eating. Initial encounter. EXAM: ESOPHOGRAM/BARIUM SWALLOW TECHNIQUE: Single contrast examination was performed using  thin barium. FLUOROSCOPY TIME:  Radiation Exposure Index (as provided by the fluoroscopic device): If the device does not provide the exposure index: Fluoroscopy Time:  1 minute and 6 seconds Number of Acquired Images:  All images acquired with fluoro store. COMPARISON:  Abdominal CT 04/23/2015. FINDINGS: Study was performed in the supine, semi erect and right lateral decubitus positions due to the patient's limited mobility. The esophageal motility is within normal limits. No mucosal ulceration, stricture or mass identified. No laryngeal penetration or cervical esophageal abnormality identified. No significant gastroesophageal reflux was elicited with the water siphon test. A 13 mm barium tablet was administered. This was retained in the distal esophagus for approximately 2 minutes. With the aid of thin barium and applesauce, this tablet did eventually pass into the stomach. IMPRESSION: 1. No significant findings identified. No evidence of esophagitis or significant stricture. 2. Barium tablet was temporarily retained within the distal esophagus, although did pass into the stomach. Electronically Signed   By: Richardean Sale M.D.   On: 04/24/2015 09:51   Dg Chest Port 1 View  05/18/2015  CLINICAL DATA:  Shortness of breath and weakness. Chronic renal failure. EXAM: PORTABLE CHEST 1 VIEW COMPARISON:  April 23, 2015 FINDINGS: There is no edema or consolidation. The heart size is upper normal with pulmonary vascularity within normal limits. No adenopathy. There is atherosclerotic calcification in the aortic arch. No bone lesions. A sternal wire is noted, stable. IMPRESSION: No edema or consolidation. Electronically Signed   By: Lowella Grip III M.D.   On: 05/18/2015 11:08   Dg Abd  Portable 1v  05/18/2015  CLINICAL DATA:  Abdominal pain EXAM: PORTABLE ABDOMEN - 1 VIEW COMPARISON:  None. FINDINGS: Scattered large and small bowel gas is noted. Fecal material is noted throughout the colon. A dialysis catheter is noted within the deep pelvis. No free air is seen. Mild degenerative changes of the lumbar spine are noted. IMPRESSION: No acute abnormality seen Electronically Signed   By: Inez Catalina M.D.   On: 05/18/2015 16:52   Dg Foot Complete Left  05/18/2015  CLINICAL DATA:  First toe amputation 2 weeks ago with discoloration at the surgical site EXAM: LEFT FOOT - COMPLETE 3+ VIEW COMPARISON:  04/23/2015 FINDINGS: There is been surgical excision of the first toe and first metatarsal. No definitive bony erosion to suggest osteomyelitis is noted. No gross soft tissue abnormality is seen. Diffuse vascular calcifications are seen. IMPRESSION: Postsurgical changes without acute abnormality. Electronically Signed   By: Inez Catalina M.D.   On: 05/18/2015 12:04   Dg Foot Complete Left  04/23/2015  CLINICAL DATA:  Left great toe cellulitis and cyanosis.  Diabetes. EXAM: LEFT FOOT - COMPLETE 3+ VIEW COMPARISON:  None. FINDINGS: There is no evidence of fracture or dislocation. No evidence of osteolysis or periostitis. Old fracture deformity of the fifth metatarsal is noted. Prominent dorsal and plantar calcaneal bone spurs noted. Diffuse peripheral vascular calcification noted. IMPRESSION: No radiographic evidence of osteomyelitis or other acute findings. Electronically Signed   By: Earle Gell M.D.   On: 04/23/2015 18:10    CBC  Recent Labs Lab 05/18/15 1138 05/18/15 1813 05/19/15 0226 05/19/15 0910 05/20/15 0311  WBC 9.7  --  7.7  --  8.3  HGB 14.3 13.8 11.1* 11.6* 11.0*  HCT 44.3 41.7 34.9* 37.3* 35.2*  PLT 365  --  359  --  317  MCV 101.4*  --  101.2*  --  102.0*  MCH 32.7  --  32.2  --  31.9  MCHC 32.3  --  31.8  --  31.3  RDW 14.8  --  15.3  --  15.2  LYMPHSABS 1.6  --    --   --   --   MONOABS 0.7  --   --   --   --   EOSABS 0.1  --   --   --   --   BASOSABS 0.1  --   --   --   --     Chemistries   Recent Labs Lab 05/18/15 1138 05/19/15 0226 05/20/15 0311 05/20/15 0845  NA 130* 136 133*  --   K 4.4 4.3 3.3*  --   CL 91* 98* 96*  --   CO2 23 28 26   --   GLUCOSE 303* 191* 213*  --   BUN 38* 36* 33*  --   CREATININE 6.59* 6.11* 5.91*  --   CALCIUM 9.5 8.1* 7.8*  --   MG  --   --   --  1.5*  AST 38 24  --   --   ALT 18 14*  --   --   ALKPHOS 155* 107  --   --   BILITOT 0.5 0.7  --   --    ------------------------------------------------------------------------------------------------------------------ estimated creatinine clearance is 13.5 mL/min (by C-G formula based on Cr of 5.91). ------------------------------------------------------------------------------------------------------------------ No results for input(s): HGBA1C in the last 72 hours. ------------------------------------------------------------------------------------------------------------------ No results for input(s): CHOL, HDL, LDLCALC, TRIG, CHOLHDL, LDLDIRECT in the last 72 hours. ------------------------------------------------------------------------------------------------------------------ No results for input(s): TSH, T4TOTAL, T3FREE, THYROIDAB in the last 72 hours.  Invalid input(s): FREET3 ------------------------------------------------------------------------------------------------------------------ No results for input(s): VITAMINB12, FOLATE, FERRITIN, TIBC, IRON, RETICCTPCT in the last 72 hours.  Coagulation profile No results for input(s): INR, PROTIME in the last 168 hours.  No results for input(s): DDIMER in the last 72 hours.  Cardiac Enzymes No results for input(s): CKMB, TROPONINI, MYOGLOBIN in the last 168 hours.  Invalid input(s):  CK ------------------------------------------------------------------------------------------------------------------ Invalid input(s): POCBNP    Brunetta Newingham D.O. on 05/20/2015 at 9:45 AM  Between 7am  to 7pm - Pager - [phone]  After 7pm go to [email or web address] - password [email or web address]  And look for the night coverage person covering for me after hours  [facility] Office  [phone]

## 2015-05-20 NOTE — Interval H&P Note (Signed)
History and Physical Interval Note:  05/20/2015 11:23 AM  Jack Chung  has presented today for surgery, with the diagnosis of Dysphagia coffee-ground emesis  The various methods of treatment have been discussed with the patient and family. After consideration of risks, benefits and other options for treatment, the patient has consented to  Procedure(s) with comments: ESOPHAGOGASTRODUODENOSCOPY (EGD) (N/A) - Possible esophageal dilatation as a surgical intervention .  The patient's history has been reviewed, patient examined, no change in status, stable for surgery.  I have reviewed the patient's chart and labs.  Questions were answered to the patient's satisfaction.     [place] C.

## 2015-05-20 NOTE — Progress Notes (Signed)
Inpatient Diabetes Program Recommendations  AACE/ADA: New Consensus Statement on Inpatient Glycemic Control (2015)  Target Ranges:  Prepandial:   less than 140 mg/dL      Peak postprandial:   less than 180 mg/dL (1-2 hours)      Critically ill patients:  140 - 180 mg/dL   Review of Glycemic Control  Inpatient Diabetes Program Recommendations:    Patient takes lantus 40 units at HS at home. Glucose at 201 mg/dL this am. Please consider addition of basal lantus while here starting with 10-15 units daily or HS.  Thank you Rosita Kea, RN, MSN, CDE  Diabetes Inpatient Program Office: [phone] Pager: [phone] 8:00 am to 5:00 pm

## 2015-05-21 ENCOUNTER — Encounter (HOSPITAL_COMMUNITY): Payer: Self-pay | Admitting: Gastroenterology

## 2015-05-21 DIAGNOSIS — R1310 Dysphagia, unspecified: Secondary | ICD-10-CM

## 2015-05-21 LAB — BASIC METABOLIC PANEL
Anion gap: 10 (ref 5–15)
BUN: 32 mg/dL — AB (ref 6–20)
CHLORIDE: 96 mmol/L — AB (ref 101–111)
CO2: 26 mmol/L (ref 22–32)
CREATININE: 5.83 mg/dL — AB (ref 0.61–1.24)
Calcium: 7.6 mg/dL — ABNORMAL LOW (ref 8.9–10.3)
GFR calc Af Amer: 10 mL/min — ABNORMAL LOW (ref >60)
GFR calc non Af Amer: 9 mL/min — ABNORMAL LOW (ref >60)
GLUCOSE: 297 mg/dL — AB (ref 65–99)
Potassium: 3.4 mmol/L — ABNORMAL LOW (ref 3.5–5.1)
Sodium: 132 mmol/L — ABNORMAL LOW (ref 135–145)

## 2015-05-21 LAB — CBC
HEMATOCRIT: 34.1 % — AB (ref 39.0–52.0)
HEMOGLOBIN: 10.5 g/dL — AB (ref 13.0–17.0)
MCH: 31.3 pg (ref 26.0–34.0)
MCHC: 30.8 g/dL (ref 30.0–36.0)
MCV: 101.8 fL — AB (ref 78.0–100.0)
Platelets: 300 10*3/uL (ref 150–400)
RBC: 3.35 MIL/uL — ABNORMAL LOW (ref 4.22–5.81)
RDW: 14.9 % (ref 11.5–15.5)
WBC: 7.4 10*3/uL (ref 4.0–10.5)

## 2015-05-21 LAB — GLUCOSE, CAPILLARY
Glucose-Capillary: 200 mg/dL — ABNORMAL HIGH (ref 65–99)
Glucose-Capillary: 225 mg/dL — ABNORMAL HIGH (ref 65–99)

## 2015-05-21 MED ORDER — DOXYCYCLINE HYCLATE 100 MG PO TABS: 100.0000 mg | ORAL_TABLET | Freq: Two times a day (BID) | ORAL | Status: DC

## 2015-05-21 MED ORDER — FLUCONAZOLE 200 MG PO TABS: 200.0000 mg | ORAL_TABLET | Freq: Every day | ORAL | Status: DC

## 2015-05-21 MED ORDER — INSULIN GLARGINE 100 UNIT/ML ~~LOC~~ SOLN: 5.0000 [IU] | Freq: Once | SUBCUTANEOUS | Status: DC

## 2015-05-21 MED ORDER — POTASSIUM CHLORIDE CRYS ER 20 MEQ PO TBCR
40.0000 meq | EXTENDED_RELEASE_TABLET | Freq: Once | ORAL | Status: AC
  Administered 2015-05-21: 40 meq via ORAL
  Filled 2015-05-21: qty 2

## 2015-05-21 MED ORDER — COLLAGENASE 250 UNIT/GM EX OINT
TOPICAL_OINTMENT | Freq: Every day | CUTANEOUS | Status: DC
Start: 2015-05-21 — End: 2015-06-12

## 2015-05-21 MED ORDER — BOOST / RESOURCE BREEZE PO LIQD: 1.0000 | Freq: Three times a day (TID) | ORAL | Status: DC

## 2015-05-21 MED ORDER — INSULIN GLARGINE 100 UNIT/ML ~~LOC~~ SOLN: 15.0000 [IU] | Freq: Every day | SUBCUTANEOUS | Status: DC

## 2015-05-21 MED ORDER — RISAQUAD PO CAPS: 2.0000 | ORAL_CAPSULE | Freq: Every day | ORAL | Status: DC

## 2015-05-21 NOTE — Evaluation (Signed)
Physical Therapy Evaluation Patient Details Name: Jack Chung MRN: [id number] DOB: 10-24-1940 Today's Date: 05/21/2015   History of Present Illness  Jack Chung is a 74 y.o. 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 Who presents with recurrent nausea, vomiting worse over last 3 days. Patient has had nausea and vomiting, dysphagia to liquids and solids for last 3 moths , worse over last 3 days. His wife report coffee ground emesis today. He started to have epigastric pain in the ED when he sat up in the bed. Last BM 2 days ago. He has not being able to keep down antibiotics prescribe by Dr [name] for foot infection.   Clinical Impression  Pt admitted with above diagnosis. Pt currently with functional limitations due to the deficits listed below (see PT Problem List). Pt was able to ambulate but needed cues for decreasing weight bearing.  States he uses electric wheelchair more at home.  Will continue home PT.   Pt will benefit from skilled PT to increase their independence and safety with mobility to allow discharge to the venue listed below.      Follow Up Recommendations Home health PT;Supervision/Assistance - 24 hour    Equipment Recommendations   (tub bench)    Recommendations for Other Services       Precautions / Restrictions Precautions Precautions: Fall Required Braces or Orthoses: Other Brace/Splint (post-op shoe) Other Brace/Splint: post-op shoe Lt Restrictions Weight Bearing Restrictions: Yes LLE Weight Bearing: Non weight bearing      Mobility  Bed Mobility Overal bed mobility: Modified Independent             General bed mobility comments: extra time  Transfers Overall transfer level: Needs assistance Equipment used: Rolling walker (2 wheeled) Transfers: Sit to/from Stand Sit to Stand: Min guard         General transfer comment: Close guard for safety. Requires quite a bit of effort to rise but was eventually  able to perform without physical assistance. Performed from bed and recliner. VC for hand placement and anterior weight shift. [place] for [employer]. Cues to maintain NWB through LLE. Good RW control and balance once upright.  Ambulation/Gait Ambulation/Gait assistance: Min guard;+2 safety/equipment Ambulation Distance (Feet): 100 Feet Assistive device: Rolling walker (2 wheeled) Gait Pattern/deviations: Step-to pattern;Antalgic   Gait velocity interpretation: Below normal speed for age/gender General Gait Details: Pt able to keep weight off left LE but not able to consistently perform NWB left LE.    Stairs            Wheelchair Mobility    Modified Rankin (Stroke Patients Only)       Balance Overall balance assessment: Needs assistance Sitting-balance support: No upper extremity supported;Feet supported Sitting balance-Leahy Scale: Fair     Standing balance support: Bilateral upper extremity supported;During functional activity Standing balance-Leahy Scale: Poor Standing balance comment: Relies on RW.                              Pertinent Vitals/Pain Pain Assessment: No/denies pain  VSS    Home Living Family/patient expects to be discharged to:: Private residence Living Arrangements: Spouse/significant other Available Help at Discharge: Family;Available 24 hours/day Type of Home: Apartment Home Access: Ramped entrance     Home Layout: One level Home Equipment: Walker - 2 wheels;Shower seat;Electric scooter;Cane - single point;Bedside commode      Prior Function Level of Independence: Independent  with assistive device(s)         Comments: RW for ambulating     Hand Dominance        Extremity/Trunk Assessment   Upper Extremity Assessment: Defer to OT evaluation           Lower Extremity Assessment: LLE deficits/detail   LLE Deficits / Details: Lt foot bandaged. Reports numbness     Communication   Communication: No difficulties   Cognition Arousal/Alertness: Awake/alert Behavior During Therapy: WFL for tasks assessed/performed Overall Cognitive Status: Within Functional Limits for tasks assessed Area of Impairment: Safety/judgement     Memory: Decreased recall of precautions   Safety/Judgement: Decreased awareness of safety          General Comments General comments (skin integrity, edema, etc.): Pt will mostly be using electric scooter at home per pt.      Exercises General Exercises - Lower Extremity Ankle Circles/Pumps: AROM;Both;10 reps;Seated Long Arc Quad: Strengthening;Both;10 reps;Seated Hip Flexion/Marching: Strengthening;Both;10 reps;Seated      Assessment/Plan    PT Assessment Patient needs continued PT services  PT Diagnosis Difficulty walking;Generalized weakness   PT Problem List Decreased strength;Decreased range of motion;Decreased activity tolerance;Decreased balance;Decreased mobility;Decreased knowledge of use of DME;Impaired sensation;Obesity  PT Treatment Interventions DME instruction;Functional mobility training;Therapeutic activities;Therapeutic exercise;Balance training;Neuromuscular re-education;Patient/family education;Modalities   PT Goals (Current goals can be found in the Care Plan section) Acute Rehab PT Goals Patient Stated Goal: Go home soon PT Goal Formulation: With patient Time For Goal Achievement: 06/04/15 Potential to Achieve Goals: Good    Frequency Min 3X/week   Barriers to discharge        Co-evaluation               End of Session Equipment Utilized During Treatment: Gait belt Activity Tolerance: Patient tolerated treatment well Patient left: in chair;with call bell/phone within reach;with family/visitor present Nurse Communication: Mobility status         Time: [id number] PT Time Calculation (min) (ACUTE ONLY): 21 min   Charges:   PT Evaluation $Initial PT Evaluation Tier I: 1 Procedure     PT G CodesDenice Paradise 05-31-15, 3:21 PM [place] [facility] Acute Rehabilitation [phone] (pager)

## 2015-05-21 NOTE — Progress Notes (Signed)
Inpatient Diabetes Program Recommendations  AACE/ADA: New Consensus Statement on Inpatient Glycemic Control (2015)  Target Ranges:  Prepandial:   less than 140 mg/dL      Peak postprandial:   less than 180 mg/dL (1-2 hours)      Critically ill patients:  140 - 180 mg/dL   Review of Glycemic Control  Inpatient Diabetes Program Recommendations:  Insulin - Basal: Increase Lantus to 15 units  Correction (SSI): increase to moderate scale  Thank you  Raoul Pitch BSN, RN,CDE Inpatient Diabetes Coordinator [phone] (team pager)

## 2015-05-21 NOTE — Discharge Instructions (Signed)
Diabetes and Foot Care Diabetes may cause you to have problems because of poor blood supply (circulation) to your feet and legs. This may cause the skin on your feet to become thinner, break easier, and heal more slowly. Your skin may become dry, and the skin may peel and crack. You may also have nerve damage in your legs and feet causing decreased feeling in them. You may not notice minor injuries to your feet that could lead to infections or more serious problems. Taking care of your feet is one of the most important things you can do for yourself.  HOME CARE INSTRUCTIONS  Wear shoes at all times, even in the house. Do not go barefoot. Bare feet are easily injured.  Check your feet daily for blisters, cuts, and redness. If you cannot see the bottom of your feet, use a mirror or ask someone for help.  Wash your feet with warm water (do not use hot water) and mild soap. Then pat your feet and the areas between your toes until they are completely dry. Do not soak your feet as this can dry your skin.  Apply a moisturizing lotion or petroleum jelly (that does not contain alcohol and is unscented) to the skin on your feet and to dry, brittle toenails. Do not apply lotion between your toes.  Trim your toenails straight across. Do not dig under them or around the cuticle. File the edges of your nails with an emery board or nail file.  Do not cut corns or calluses or try to remove them with medicine.  Wear clean socks or stockings every day. Make sure they are not too tight. Do not wear knee-high stockings since they may decrease blood flow to your legs.  Wear shoes that fit properly and have enough cushioning. To break in new shoes, wear them for just a few hours a day. This prevents you from injuring your feet. Always look in your shoes before you put them on to be sure there are no objects inside.  Do not cross your legs. This may decrease the blood flow to your feet.  If you find a minor scrape,  cut, or break in the skin on your feet, keep it and the skin around it clean and dry. These areas may be cleansed with mild soap and water. Do not cleanse the area with peroxide, alcohol, or iodine.  When you remove an adhesive bandage, be sure not to damage the skin around it.  If you have a wound, look at it several times a day to make sure it is healing.  Do not use heating pads or hot water bottles. They may burn your skin. If you have lost feeling in your feet or legs, you may not know it is happening until it is too late.  Make sure your health care provider performs a complete foot exam at least annually or more often if you have foot problems. Report any cuts, sores, or bruises to your health care provider immediately. SEEK MEDICAL CARE IF:   You have an injury that is not healing.  You have cuts or breaks in the skin.  You have an ingrown nail.  You notice redness on your legs or feet.  You feel burning or tingling in your legs or feet.  You have pain or cramps in your legs and feet.  Your legs or feet are numb.  Your feet always feel cold. SEEK IMMEDIATE MEDICAL CARE IF:   There is increasing redness,   swelling, or pain in or around a wound.  There is a red line that goes up your leg.  Pus is coming from a wound.  You develop a fever or as directed by your health care provider.  You notice a bad smell coming from an ulcer or wound.   This information is not intended to replace advice given to you by your health care provider. Make sure you discuss any questions you have with your health care provider.   Document Released: 05/21/2000 Document Revised: 01/24/2013 Document Reviewed: 10/31/2012 Elsevier Interactive Patient Education 2016 Elsevier Inc[employer]  

## 2015-05-21 NOTE — Care Management Important Message (Signed)
Important Message  Patient Details  Name: Jack Chung MRN: [id number] Date of Birth: 10-Mar-1941   Medicare Important Message Given:  Yes    Nathen May 05/21/2015, 11:14 AM

## 2015-05-21 NOTE — Care Management Note (Signed)
Case Management Note  Patient Details  Name: Jack Chung MRN: [id number] Date of Birth: 08-22-1940  Subjective/Objective:     Sepsis secondary to diabetic foot infection              PCP- Dr Serita Grammes  Action/Plan: NCM spoke to pt and gave permission to speak to wife, Jack Chung. Pt active with [facility]. Requesting tub bench for home. Pt has RW, wheelchair, cane and bedside commode at home. Wife states she receives his supplies for his PD through mail order. Contacted [facility] for resumption of care and DME rep for tub bench. [facility] will speak to wife about out of pocket cost for tub bench not covered by insurance. Can afford medications at dc.   Expected Discharge Date:  05/21/2015               Expected Discharge Plan:  [place]  In-House Referral:     Discharge planning Services  CM Consult  Post Acute Care Choice:  Home Health, Resumption of Svcs/PTA Provider Choice offered to:  Spouse  DME Arranged:  Tub bench DME Agency:  [place] Arranged:  RN, PT [facility] Agency:  [place]  Status of Service:  Completed, signed off  Medicare Important Message Given:  Yes Date Medicare IM Given:    Medicare IM give by:    Date Additional Medicare IM Given:    Additional Medicare Important Message give by:     If discussed at [place] of Stay Meetings, dates discussed:    Additional Comments:  Erenest Rasher, RN 05/21/2015, 12:40 PM

## 2015-05-21 NOTE — Progress Notes (Signed)
Patient discharged via wheelchair to car with wife. Patient's belongings were transported with him, including cell phone and clothing. On Discharge, patient did not want to take his prescription for Doxycycline  100MG  tablets Twice a Day #20. Patient states he has plenty of these at home and does not require it. Patient was set up with a wound care nurse and PT for when he returns home. Bandage was changed on his Left Foot and his IV was removed prior to discharge.  Milford Cage, RN

## 2015-05-21 NOTE — Progress Notes (Signed)
[facility] Progress Note   Subjective: no complaints  Filed Vitals:   05/21/15 0000 05/21/15 0422 05/21/15 0500 05/21/15 0800  BP: 112/95 94/43  109/53  Pulse: 62 61  63  Temp: 97.9 F (36.6 C) 98 F (36.7 C)  97.8 F (36.6 C)  TempSrc: Oral Axillary  Oral  Resp: 20 18  20   Height:      Weight:   105.2 kg (231 lb 14.8 oz)   SpO2: 94% 100%  96%    Inpatient medications: . acidophilus  2 capsule Oral Daily  . allopurinol  100 mg Oral Daily  . B-complex with vitamin C  1 tablet Oral BID  . calcium carbonate  2 tablet Oral TID WC  . collagenase   Topical Daily  . dialysis solution 1.5% low-MG/low-CA   Intraperitoneal 5 X Daily  . feeding supplement  1 Container Oral TID BM  . fluconazole (DIFLUCAN) IV  200 mg Intravenous Q24H  . gentamicin cream   Topical Daily  . insulin aspart  0-9 Units Subcutaneous TID WC  . [START ON 05/22/2015] insulin glargine  15 Units Subcutaneous Daily  . insulin glargine  5 Units Subcutaneous Once  . levothyroxine  112 mcg Oral QAC breakfast  . metoCLOPramide  10 mg Oral TID AC  . midodrine  10 mg Oral TID  . multivitamin  1 tablet Oral BID  . pantoprazole (PROTONIX) IV  40 mg Intravenous Q12H  . piperacillin-tazobactam (ZOSYN)  IV  2.25 g Intravenous Q8H   . sodium chloride 20 mL/hr at 05/20/15 1751   acetaminophen **OR** acetaminophen, fentaNYL (SUBLIMAZE) injection, heparin, lactulose, ondansetron **OR** ondansetron (ZOFRAN) IV, oxyCODONE  Exam: Elderly WM, no distress No jvd Chest clear bilat RRR distant HS no RG Abd soft ntnd PD cath LLQ clean exit site Ext RLE no edema/ wounds Left foot wrapped Neuro nf, Ox 3, calm  CAPD 5 exchanges per day (9-12-3-6-9p), 3000 cc fill vol, alternates 1.5% /2.5%. 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 6 CAD hx cabg 7 RUA AV fistula - never used 8 Nutrition added MVI/ B/ C/ probiotic 9 Constipation - better per pt 10 DM2 lots of complications, on insulin 11 DIspo - per primary, stable from our standpoint  Plan - cont PD, all 1.5%   [name] MD [facility] pager [phone]    cell [phone] 05/21/2015, 10:39 AM    Recent Labs Lab 05/19/15 0226 05/20/15 0311 05/20/15 1550 05/21/15 0250  NA 136 133*  --  132*  K 4.3 3.3* 4.2 3.4*  CL 98* 96*  --  96*  CO2 28 26  --  26  GLUCOSE 191* 213*  --  297*  BUN 36* 33*  --  32*  CREATININE 6.11* 5.91*  --  5.83*  CALCIUM 8.1* 7.8*  --  7.6*  PHOS  --  4.1  --   --     Recent Labs Lab 05/18/15 1138 05/19/15 0226 05/20/15 0311  AST 38 24  --   ALT 18 14*  --   ALKPHOS 155* 107  --   BILITOT 0.5 0.7  --   PROT 7.4 5.4*  --   ALBUMIN 2.6* 1.9* 1.8*    Recent Labs Lab  05/18/15 1138  05/19/15 0226 05/19/15 0910 05/20/15 0311 05/21/15 0250  WBC 9.7  --  7.7  --  8.3 7.4  NEUTROABS 7.3  --   --   --   --   --   HGB 14.3  < > 11.1* 11.6* 11.0* 10.5*  HCT 44.3  < > 34.9* 37.3* 35.2* 34.1*  MCV 101.4*  --  101.2*  --  102.0* 101.8*  PLT 365  --  359  --  317 300  < > = values in this interval not displayed.

## 2015-05-21 NOTE — Discharge Summary (Signed)
Physician Discharge Summary  Jack Chung [id number] DOB: Oct 20, 1940 DOA: 05/18/2015  PCP: Jack Neer, MD  Admit date: 05/18/2015 Discharge date: 05/21/2015  Time spent: 45 minutes  Recommendations for Outpatient Follow-up:  Patient will be discharged to home with home health, wound care.  Patient will need to follow up with primary care provider within one week of discharge, repeat CBC and BMP.  Follow up with Dr. Sharol Chung in one week. Patient will also need follow-up with gastroenterology, Dr. Michail Chung. Patient should continue medications as prescribed.  Patient should follow a heart healthy/carb modified diet. Continue peritoneal dialysis.  Discharge Diagnoses:  Sepsis secondary to diabetic foot infection Questionable second degree heart block ESRD Hypokalemia/hypomagnesemia Diabetes mellitus, type II Coffee-ground emesis with odynophagia plus history of gastroparesis Hypothyroidism Sarcoidosis Chronic hypotension  Discharge Condition: Stable  Diet recommendation: heart healthy/carb modified diet  Filed Weights   05/19/15 0500 05/20/15 0500 05/21/15 0500  Weight: 105.6 kg (232 lb 12.9 oz) 105.098 kg (231 lb 11.2 oz) 105.2 kg (231 lb 14.8 oz)    History of present illness:  on 05/18/2015 by Dr. Niel Hummer CRISTIN Chung is a 74 y.o. 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 Who presents with recurrent nausea, vomiting worse over last 3 days. Patient has had nausea and vomiting, dysphagia to liquids and solids for last 3 moths , worse over last 3 days. His wife report coffee ground emesis today. He started to have epigastric pain in the ED when he sat up in the bed. Last BM 2 days ago. He has not being able to keep down antibiotics prescribe by Dr Jack Chung for foot infection.  Evaluation in the ED; patient afebrile, tachypnea, lactic acid at 5, foot x ray with post surgical changes, no acte finding  Hospital Course:  Sepsis  secondary to diabetic foot infection -Upon admission, patient did have hypotension and lactic acidosis -Dr. Sharol Chung, orthopedic surgery, consulted and appreciated. Patient declined amputation at this time, continue wound care along with Santyl dressing changes and IV antibiotics -Lactic acid has improved -Culture showed no growth to date -Initially placed on vancomycin and Zosyn.  Will discharge with doxycycline.  Patient will need to follow up with Dr. Sharol Chung in one week.  (Confirmed this with Dr. Sharol Chung)  ?Second degree heart block  -Patient has known first-degree AV block, repeat EKG did show AV block this morning -supposedly patient had second-degree AV block overnight 12/13.   -Magnesium obtained, 2.0 after replacement -Cardiology consulted and appreciated- recommended supportive care and follow up with Jack Chung.  No indication for PPM -Echocardiogram 01/08/2015 showed EF of 0000000, grade 1 diastolic dysfunction  ESRD -Continue peritoneal dialysis -Nephrology consult appreciated  Coffee-ground emesis with odynophagia/history of gastroparesis -Gastroenterology consulted and appreciated -EGD Candida esophagitis, patient started on Diflucan- will continue for 2 weeks -Continue Protonix, Reglan -Hemoglobin stable at 10.5 today  Acute on chronic hypotension -Blood pressure has been stable -Continue Midodrine  Diabetes mellitus, type II -Continue home medications  -Diabetes coordinator following and appreciated  Hypokalemia/hypomagnesemia -Replaced and continue to monitor BMP. Potassium 3.4 -Magnesium 2 -Will need repeat BMP in one week  Hypothyroidism -Continue Synthroid -TSH 5.075 -FT4 pending and can be followed up by PCP  Sarcoidosis -Stable  Procedures  EGD  Consults  Gastroenterology Nephrology Orthopedics Cardiology  Discharge Exam: Filed Vitals:   05/21/15 0422 05/21/15 0800  BP: 94/43 109/53  Pulse: 61 63  Temp: 98 F (36.7 C) 97.8 F (36.6 C)  Resp: 18 20   Exam  General: Well developed, well nourished, NAD  HEENT: NCAT, mucous membranes moist.   Cardiovascular: S1 S2 auscultated, RRR, no murmurs  Respiratory: Clear to auscultation bilaterally  Abdomen: Soft, nontender, nondistended, + bowel sounds. Peritoneal catheter in place LLQ  Extremities: warm dry without cyanosis clubbing. Left foot wrapped  Neuro: AAOx3, nonfocal  Psych: Normal affect and demeanor  Discharge Instructions     Medication List    ASK your doctor about these medications        allopurinol 100 MG tablet  Commonly known as:  ZYLOPRIM  Take 100 mg by mouth daily.     aspirin 325 MG tablet  Take 325 mg by mouth daily.     calcium carbonate 500 MG chewable tablet  Commonly known as:  TUMS - dosed in mg elemental calcium  Chew 2 tablets by mouth 3 (three) times daily with meals.     cetirizine 10 MG tablet  Commonly known as:  ZYRTEC  Take 10 mg by mouth daily.     doxycycline 100 MG tablet  Commonly known as:  VIBRA-TABS  Take 1 tablet (100 mg total) by mouth 2 (two) times daily.     gentamicin cream 0.1 %  Commonly known as:  GARAMYCIN  Apply 1 application topically at bedtime.     insulin aspart 100 UNIT/ML injection  Commonly known as:  novoLOG  Inject 10-15 Units into the skin 3 (three) times daily with meals. Take none if blood sugar < 150, 151-200 2 units, 201-250 4 units, 251-300 6 units, 301-350 8 units     Insulin Glargine 100 UNIT/ML Solostar Pen  Commonly known as:  LANTUS  Inject 45 units into the skin daily. Dx 250.00     lactulose 10 GM/15ML solution  Commonly known as:  CHRONULAC  Take 10 g by mouth every 4 (four) hours as needed for mild constipation.     levothyroxine 112 MCG tablet  Commonly known as:  SYNTHROID, LEVOTHROID  LABS/APPOINTMENT OVERDUE, 1 by mouth daily in the morning 30 minutes before breakfast for Thyroid     loperamide 2 MG capsule  Commonly known as:  IMODIUM  Take 2 mg by mouth  daily as needed for diarrhea or loose stools.     metoCLOPramide 5 MG tablet  Commonly known as:  REGLAN  Take 2 tablets (10 mg total) by mouth 3 (three) times daily before meals.     midodrine 10 MG tablet  Commonly known as:  PROAMATINE  Take 1 tablet (10 mg total) by mouth 3 (three) times daily.     MULTI VITAMIN DAILY PO  Take 1 tablet by mouth daily.     nitroGLYCERIN 0.2 mg/hr patch  Commonly known as:  NITRODUR - Dosed in mg/24 hr  Place 0.2 mg onto the skin daily.     omeprazole 10 MG capsule  Commonly known as:  PRILOSEC  Take 10 mg by mouth daily.     oxyCODONE-acetaminophen 5-325 MG tablet  Commonly known as:  PERCOCET/ROXICET  Take 1-2 tablets by mouth every 6 (six) hours as needed for moderate pain.     PERITONEAL DIALYSIS SOLUTIONS IP  Inject 3,000 Units into the peritoneum 5 (five) times daily.     potassium chloride SA 20 MEQ tablet  Commonly known as:  K-DUR,KLOR-CON  Take 20 mEq by mouth daily.       Allergies  Allergen Reactions  . Flu Virus Vaccine     Gets the  flu, patient has bad reaction to the flu vaccine.  . Lipitor [Atorvastatin]     Leg cramps       Follow-up Information    Follow up with DUDA,MARCUS V, MD In 1 week.   Specialty:  Orthopedic Surgery   Contact information:   [address] [place] [id number] [phone]        The results of significant diagnostics from this hospitalization (including imaging, microbiology, ancillary and laboratory) are listed below for reference.    Significant Diagnostic Studies: Ct Abdomen Pelvis Wo Contrast  04/23/2015  CLINICAL DATA:  Subacute onset of postprandial vomiting. Initial encounter. EXAM: CT ABDOMEN AND PELVIS WITHOUT CONTRAST TECHNIQUE: Multidetector CT imaging of the abdomen and pelvis was performed following the standard protocol without IV contrast. COMPARISON:  CT of the abdomen and pelvis from 07/17/2013, and abdominal ultrasound performed 04/21/2015 FINDINGS:  Scattered nodules at the right lung base demonstrate mild calcification and are likely benign. Wall thickening along the distal esophagus could reflect esophagitis. Would correlate for any associated symptoms. Diffuse coronary artery calcifications are seen. Small to moderate volume ascites is seen within the abdomen and pelvis. A peritoneal dialysis catheter is noted at the left hemipelvis. Mild vague soft tissue inflammation is noted within the mesentery, nonspecific in appearance. The liver and spleen are unremarkable in appearance. The gallbladder is within normal limits. The pancreas and adrenal glands are unremarkable. The kidneys are unremarkable in appearance. There is no evidence of hydronephrosis. No renal or ureteral stones are seen. Nonspecific perinephric stranding is noted bilaterally. No free fluid is identified. The small bowel is unremarkable in appearance. The stomach is within normal limits. No acute vascular abnormalities are seen. Scattered calcification is noted along the abdominal aorta and its branches. The appendix is normal in caliber, without evidence of appendicitis. The colon is unremarkable in appearance. The bladder is relatively decompressed and grossly unremarkable in appearance. The prostate remains normal in size. No inguinal lymphadenopathy is seen. No acute osseous abnormalities are identified. IMPRESSION: 1. Mild wall thickening along the distal esophagus could reflect esophagitis. Would correlate for associated symptoms. 2. Small to moderate volume ascites within the abdomen and pelvis. Peritoneal dialysis catheter noted at the right hemipelvis. 3. Mild vague nonspecific soft tissue inflammation noted within the mesentery. 4. Scattered calcification along the abdominal aorta and its branches. 5. Diffuse coronary artery calcifications seen. 6. Scattered nodules at the right lung base demonstrate mild calcification and are likely benign. Electronically Signed   By: Garald Balding  M.D.   On: 04/23/2015 19:31   Dg Chest 2 View  04/23/2015  CLINICAL DATA:  Acute onset of shortness of breath. Initial encounter. EXAM: CHEST  2 VIEW COMPARISON:  Chest radiograph from 12/30/2014 FINDINGS: The lungs are well-aerated and clear. There is no evidence of focal opacification, pleural effusion or pneumothorax. The heart is borderline enlarged. The patient is status post median sternotomy. No acute osseous abnormalities are seen. IMPRESSION: No acute cardiopulmonary process seen. Electronically Signed   By: Garald Balding M.D.   On: 04/23/2015 19:01   Nm Gastric Emptying  04/28/2015  CLINICAL DATA:  Recurrent vomiting. EXAM: NUCLEAR MEDICINE GASTRIC EMPTYING SCAN TECHNIQUE: After oral ingestion of radiolabeled meal, sequential abdominal images were obtained for 4 hours. Percentage of activity emptying the stomach was calculated at 1 hour, 2 hour, 3 hour, and 4 hours. RADIOPHARMACEUTICALS:  2.0 mCi Tc-73m MDP labeled sulfur colloid orally COMPARISON:  None. FINDINGS: Expected location of the stomach in the left upper  quadrant. Ingested meal empties the stomach gradually over the course of the study. 44% emptied at 1 hr ( normal >= 10%) 45% emptied at 2 hr ( normal >= 40%) 47% emptied at 3 hr ( normal >= 70%) 60% emptied at 4 hr ( normal >= 90%) IMPRESSION: Delayed gastric emptying study. Electronically Signed   By: Marijo Conception, M.D.   On: 04/28/2015 15:23   US Abdomen Complete  04/21/2015  CLINICAL DATA:  NAUSEA AND VOMITING. Symptoms for 1 week. No reported pain. EXAM: ULTRASOUND ABDOMEN COMPLETE COMPARISON:  CT, 07/17/2013 FINDINGS: Gallbladder: No gallstones or wall thickening visualized. No sonographic Murphy sign noted. Common bile duct: Diameter: 3.8 mm Liver: Coarsened echotexture with no discrete mass or focal lesion. Normal in size. Hepatopetal flow documented in the portal vein. IVC: Obscured by midline bowel gas. Pancreas: Obscured by midline bowel gas. Spleen: Size and appearance  within normal limits. Right Kidney: Length: 8.5 cm. Mild increased parenchymal echogenicity with cortical thinning. No mass, stone or hydronephrosis. Left Kidney: Length: 10.1 cm. Echogenicity within normal limits. No mass or hydronephrosis visualized. Abdominal aorta: Proximal and midportion obscured by bowel gas. Normal caliber distal aorta and iliac arteries. Other findings: Small to moderate amount of ascites. IMPRESSION: 1. No acute findings. 2. No gallstones.  No bile duct dilation. 3. Exam somewhat limited. Bowel gas obscures midline structures including the inferior vena cava, most of the aorta and the pancreas. 4. Right renal cortical thinning. Increased parenchymal echogenicity. This is consistent with medical renal disease. 5. Coarsened echotexture of the liver, nonspecific. 6. Small to moderate amount of ascites. Electronically Signed   By: Lajean Manes M.D.   On: 04/21/2015 15:38   Dg Esophagus  04/24/2015  CLINICAL DATA:  Dysphagia with lower chest pressure when eating. Initial encounter. EXAM: ESOPHOGRAM/BARIUM SWALLOW TECHNIQUE: Single contrast examination was performed using  thin barium. FLUOROSCOPY TIME:  Radiation Exposure Index (as provided by the fluoroscopic device): If the device does not provide the exposure index: Fluoroscopy Time:  1 minute and 6 seconds Number of Acquired Images:  All images acquired with fluoro store. COMPARISON:  Abdominal CT 04/23/2015. FINDINGS: Study was performed in the supine, semi erect and right lateral decubitus positions due to the patient's limited mobility. The esophageal motility is within normal limits. No mucosal ulceration, stricture or mass identified. No laryngeal penetration or cervical esophageal abnormality identified. No significant gastroesophageal reflux was elicited with the water siphon test. A 13 mm barium tablet was administered. This was retained in the distal esophagus for approximately 2 minutes. With the aid of thin barium and  applesauce, this tablet did eventually pass into the stomach. IMPRESSION: 1. No significant findings identified. No evidence of esophagitis or significant stricture. 2. Barium tablet was temporarily retained within the distal esophagus, although did pass into the stomach. Electronically Signed   By: Richardean Sale M.D.   On: 04/24/2015 09:51   Dg Chest Port 1 View  05/18/2015  CLINICAL DATA:  Shortness of breath and weakness. Chronic renal failure. EXAM: PORTABLE CHEST 1 VIEW COMPARISON:  April 23, 2015 FINDINGS: There is no edema or consolidation. The heart size is upper normal with pulmonary vascularity within normal limits. No adenopathy. There is atherosclerotic calcification in the aortic arch. No bone lesions. A sternal wire is noted, stable. IMPRESSION: No edema or consolidation. Electronically Signed   By: Lowella Grip III M.D.   On: 05/18/2015 11:08   Dg Abd Portable 1v  05/18/2015  CLINICAL DATA:  Abdominal pain  EXAM: PORTABLE ABDOMEN - 1 VIEW COMPARISON:  None. FINDINGS: Scattered large and small bowel gas is noted. Fecal material is noted throughout the colon. A dialysis catheter is noted within the deep pelvis. No free air is seen. Mild degenerative changes of the lumbar spine are noted. IMPRESSION: No acute abnormality seen Electronically Signed   By: Inez Catalina M.D.   On: 05/18/2015 16:52   Dg Foot Complete Left  05/18/2015  CLINICAL DATA:  First toe amputation 2 weeks ago with discoloration at the surgical site EXAM: LEFT FOOT - COMPLETE 3+ VIEW COMPARISON:  04/23/2015 FINDINGS: There is been surgical excision of the first toe and first metatarsal. No definitive bony erosion to suggest osteomyelitis is noted. No gross soft tissue abnormality is seen. Diffuse vascular calcifications are seen. IMPRESSION: Postsurgical changes without acute abnormality. Electronically Signed   By: Inez Catalina M.D.   On: 05/18/2015 12:04   Dg Foot Complete Left  04/23/2015  CLINICAL DATA:   Left great toe cellulitis and cyanosis.  Diabetes. EXAM: LEFT FOOT - COMPLETE 3+ VIEW COMPARISON:  None. FINDINGS: There is no evidence of fracture or dislocation. No evidence of osteolysis or periostitis. Old fracture deformity of the fifth metatarsal is noted. Prominent dorsal and plantar calcaneal bone spurs noted. Diffuse peripheral vascular calcification noted. IMPRESSION: No radiographic evidence of osteomyelitis or other acute findings. Electronically Signed   By: Earle Gell M.D.   On: 04/23/2015 18:10    Microbiology: Recent Results (from the past 240 hour(s))  Blood Culture (routine x 2)     Status: None (Preliminary result)   Collection Time: 05/18/15 11:24 AM  Result Value Ref Range Status   Specimen Description BLOOD LEFT ANTECUBITAL  Final   Special Requests BOTTLES DRAWN AEROBIC AND ANAEROBIC 5CC  Final   Culture NO GROWTH 3 DAYS  Final   Report Status PENDING  Incomplete  Blood Culture (routine x 2)     Status: None (Preliminary result)   Collection Time: 05/18/15 11:34 AM  Result Value Ref Range Status   Specimen Description BLOOD LEFT HAND  Final   Special Requests PEDIATRICS 4CC  Final   Culture NO GROWTH 3 DAYS  Final   Report Status PENDING  Incomplete     Labs: Basic Metabolic Panel:  Recent Labs Lab 05/18/15 1138 05/19/15 0226 05/20/15 0311 05/20/15 0845 05/20/15 1550 05/21/15 0250  NA 130* 136 133*  --   --  132*  K 4.4 4.3 3.3*  --  4.2 3.4*  CL 91* 98* 96*  --   --  96*  CO2 23 28 26   --   --  26  GLUCOSE 303* 191* 213*  --   --  297*  BUN 38* 36* 33*  --   --  32*  CREATININE 6.59* 6.11* 5.91*  --   --  5.83*  CALCIUM 9.5 8.1* 7.8*  --   --  7.6*  MG  --   --   --  1.5* 2.0  --   PHOS  --   --  4.1  --   --   --    Liver Function Tests:  Recent Labs Lab 05/18/15 1138 05/19/15 0226 05/20/15 0311  AST 38 24  --   ALT 18 14*  --   ALKPHOS 155* 107  --   BILITOT 0.5 0.7  --   PROT 7.4 5.4*  --   ALBUMIN 2.6* 1.9* 1.8*   No results for  input(s): LIPASE, AMYLASE in the  last 168 hours. No results for input(s): AMMONIA in the last 168 hours. CBC:  Recent Labs Lab 05/18/15 1138 05/18/15 1813 05/19/15 0226 05/19/15 0910 05/20/15 0311 05/21/15 0250  WBC 9.7  --  7.7  --  8.3 7.4  NEUTROABS 7.3  --   --   --   --   --   HGB 14.3 13.8 11.1* 11.6* 11.0* 10.5*  HCT 44.3 41.7 34.9* 37.3* 35.2* 34.1*  MCV 101.4*  --  101.2*  --  102.0* 101.8*  PLT 365  --  359  --  317 300   Cardiac Enzymes: No results for input(s): CKTOTAL, CKMB, CKMBINDEX, TROPONINI in the last 168 hours. BNP: BNP (last 3 results)  Recent Labs  05/18/15 1139  BNP 434.4*    ProBNP (last 3 results) No results for input(s): PROBNP in the last 8760 hours.  CBG:  Recent Labs Lab 05/20/15 1010 05/20/15 1229 05/20/15 1728 05/20/15 2234 05/21/15 0828  GLUCAP 196* 139* 169* 389* 200*    Signed:  Cristal Ford  [facility] 05/21/2015, 12:29 PM

## 2015-05-22 LAB — URINE CULTURE

## 2015-05-23 LAB — CULTURE, BLOOD (ROUTINE X 2)
Culture: NO GROWTH
Culture: NO GROWTH

## 2015-06-12 ENCOUNTER — Ambulatory Visit (INDEPENDENT_AMBULATORY_CARE_PROVIDER_SITE_OTHER): Payer: Medicare Other | Admitting: Physician Assistant

## 2015-06-12 ENCOUNTER — Other Ambulatory Visit (HOSPITAL_COMMUNITY): Payer: Self-pay | Admitting: Orthopedic Surgery

## 2015-06-12 ENCOUNTER — Encounter (HOSPITAL_COMMUNITY): Payer: Self-pay | Admitting: *Deleted

## 2015-06-12 ENCOUNTER — Encounter: Payer: Self-pay | Admitting: Physician Assistant

## 2015-06-12 VITALS — BP 136/70 | HR 93 | Ht 69.0 in | Wt 231.0 lb

## 2015-06-12 DIAGNOSIS — I2510 Atherosclerotic heart disease of native coronary artery without angina pectoris: Secondary | ICD-10-CM

## 2015-06-12 DIAGNOSIS — I441 Atrioventricular block, second degree: Secondary | ICD-10-CM

## 2015-06-12 DIAGNOSIS — I951 Orthostatic hypotension: Secondary | ICD-10-CM | POA: Diagnosis not present

## 2015-06-12 DIAGNOSIS — I2583 Coronary atherosclerosis due to lipid rich plaque: Principal | ICD-10-CM

## 2015-06-12 NOTE — Patient Instructions (Signed)
Your physician recommends that you schedule a follow-up appointment in: [place] DR. HILTY

## 2015-06-12 NOTE — Progress Notes (Signed)
Patient ID: Jack Chung, male   DOB: 09-10-1940, 75 y.o.   MRN: [id number]    Date:  06/12/2015   ID:  Jack Chung, DOB 01-06-1941, MRN [id number]  PCP:  [name], MD  Primary Cardiologist:  [place]  Chief complaint cough: Posthospital follow-up   History of Present Illness: Jack Chung is a 75 y.o. 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 In 2011 apparently he was working on putting a roof on his house and came in later in the day. His wife found him apparently sitting by the side of his bed and he was blue, confused and short of breath. She called 911 and was taken to the emergency room. Ultimately his EKG demonstrated acute MI and he underwent cardiac catheterization which showed multivessel coronary artery disease. At that time there were no clear areas that would be amenable to percutaneous intervention. A surgical consult was obtained and per his report 3 different surgeons would not operate on him but eventually Dr. Roxanna Mew did. According to his operative report there was significant multivessel coronary disease. The LAD was heavily calcified throughout its length and closed 95% proximally. The mid and distal portion of the vessel had a lumen diameter 1.25 mm and were heavily calcified. The circumflex obtuse marginal branch was patent. The posterior lateral branch was a 2 mm vessel. The right coronary artery was totally occluded. The posterior descending artery was also a very small vessel about 1.5-2 mm. He underwent a three-vessel bypass and did fairly well although had a difficult recovery. 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   Jack Chung underwent nuclear stress test in Aug 2015 which was intermediate risk and showed partial reversibility concerning for ischemia. He then underwent cardiac  catheterization which was performed by Dr. Claiborne Billings. This demonstrated the following findings:  IMPRESSION:  Severe native coronary artery disease with evidence for coronary calcification and 95% proximal LAD stenosis between the first and second diagonal vessel with subtotal LAD occlusion beyond the second diagonal vessel; subtotal occlusion of the native circumflex vessel proximally with faint filling of a marginal branch prior to total mid occlusion, and total occlusion of the mid RCA. Patent LIMA graft supplying the mid LAD with diffuse 80% mid LAD stenoses and a small caliber vessel and 95% apical LAD stenosis. Sequential vein graft supplying the PDA branch of the RCA and a distal marginal-like branch of the left circumflex coronary artery.  Medical therapy was recommended and he was started on a nitrates.   He was seen by Dr Debara Pickett on Mar 11, 2015. The patient's orthostatic issues resolved after being started on midodrine. His last echocardiogram was 01/2015 and his EF was 55-60%, hypokinesis of the basalinferior myocardium, G1DD, LA mildly dilated.   The patient presented on 05/18/15 with recurrent N, V over three days and was found to be septic secondary to foot infection. We were asked to see for 2nd degree AVB.  He was found to be hypokalemic and a low normal magnesium both of which were replaced. He underwent great toe amputation through the MTP on 11/23. He had been on antibiotics prior to admission. No acute findings on foot xray. On Vanc and Zosyn. He also had coffee-ground emesis and is undergoing and EGD.    Patient was treated for sepsis secondary to diabetic foot infection of the left foot.  Because  of coffee-ground emesis, with odynophagia, he underwent EGD showing Candida esophagitis which was treated with Diflucan. Midrin was also continued for chronic hypotension.  He is now here for posthospital follow-up.  His is scheduled for a left BKA tomorrow which is making him feel  terrible today. His left leg is strong leg and this is to make it difficult for him to get around. He apparently broke his right leg years ago and has been chronically weak.  He otherwise denies nausea, vomiting, fever, chest pain, shortness of breath, orthopnea, dizziness, PND, cough, congestion, abdominal pain, hematochezia, melena, lower extremity edema.  Wt Readings from Last 3 Encounters:  06/12/15 231 lb (104.781 kg)  05/21/15 231 lb 14.8 oz (105.2 kg)  05/01/15 237 lb 1.6 oz (107.548 kg)     Past Medical History  Diagnosis Date  . Hypertension   . Diabetes mellitus   . Hypothyroidism   . CHF (congestive heart failure) ([place])   . Anemia   . Blind left eye   . Sleep apnea     not on CPAP  . Coronary artery disease     CABG x 11 Jun 2009.  MRSA infections of incsions  . Arthritis   . Sarcoidosis ([place])     Hx:of  . Diabetic retinopathy ([place])     Hx: of bilateral  . Edema   . Hyperlipidemia   . 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 Atherosclerosis of native arteries of the extremities, unspecified   . Eczema     Hx: of  . Insomnia   . Seasonal allergies   . ESRD on dialysis [facility])     Was on dialysis in July 2011 and then stopped and restarted May 2014. He then transitioned to PD in Aug 2014. He does PD on cycler when at home and CAPD 6 exchanges per day when travelling. His wife is "in charge" and does the exchanges.   . History of blood transfusion     with heart surgery  . Coronary atherosclerosis of native coronary artery   . Atherosclerosis of native arteries of the extremities, unspecified     Current Outpatient Prescriptions  Medication Sig Dispense Refill  . acidophilus (RISAQUAD) CAPS capsule Take 2 capsules by mouth daily. 60 capsule 0  . allopurinol (ZYLOPRIM) 100 MG tablet Take 100 mg by mouth daily.    Marland Kitchen aspirin 325 MG tablet Take 325 mg by mouth daily.    . calcium carbonate (TUMS - DOSED IN MG ELEMENTAL CALCIUM) 500 MG chewable  tablet Chew 2 tablets by mouth 3 (three) times daily with meals.     . cetirizine (ZYRTEC) 10 MG tablet Take 10 mg by mouth as needed.     . feeding supplement (BOOST / RESOURCE BREEZE) LIQD Take 1 Container by mouth 3 (three) times daily between meals. 90 Container 0  . gentamicin cream (GARAMYCIN) 0.1 % Apply 1 application topically at bedtime.    . insulin aspart (NOVOLOG) 100 UNIT/ML injection Inject 10-15 Units into the skin 3 (three) times daily with meals. Take none if blood sugar < 150, 151-200 2 units, 201-250 4 units, 251-300 6 units, 301-350 8 units    . Insulin Glargine (LANTUS) 100 UNIT/ML Solostar Pen Inject 45 units into the skin daily. Dx 250.00 (Patient taking differently: 30 Units by Subconjunctival route at bedtime. ) 15 mL 6  . lactulose (CHRONULAC) 10 GM/15ML solution Take 10 g by mouth every 4 (four) hours as needed for mild constipation.    Marland Kitchen  levothyroxine (SYNTHROID, LEVOTHROID) 112 MCG tablet LABS/APPOINTMENT OVERDUE, 1 by mouth daily in the morning 30 minutes before breakfast for Thyroid 15 tablet 0  . loperamide (IMODIUM) 2 MG capsule Take 2 mg by mouth daily as needed for diarrhea or loose stools.    . metoCLOPramide (REGLAN) 5 MG tablet Take 2 tablets (10 mg total) by mouth 3 (three) times daily before meals. 180 tablet 0  . midodrine (PROAMATINE) 10 MG tablet Take 1 tablet (10 mg total) by mouth 3 (three) times daily. 270 tablet 3  . Multiple Vitamin (MULTI VITAMIN DAILY PO) Take 1 tablet by mouth daily.    Marland Kitchen omeprazole (PRILOSEC) 10 MG capsule Take 10 mg by mouth daily.    Marland Kitchen oxyCODONE-acetaminophen (PERCOCET/ROXICET) 5-325 MG tablet Take 1-2 tablets by mouth every 6 (six) hours as needed for moderate pain. 30 tablet 0  . PERITONEAL DIALYSIS SOLUTIONS IP Inject 3,000 Units into the peritoneum 5 (five) times daily.     . potassium chloride SA (K-DUR,KLOR-CON) 20 MEQ tablet Take 20 mEq by mouth daily.     No current facility-administered medications for this visit.     Allergies:    Allergies  Allergen Reactions  . Flu Virus Vaccine     Gets the flu, patient has bad reaction to the flu vaccine.  . Lipitor [Atorvastatin]     Leg cramps    Social History:  The patient  reports that he has never smoked. He has never used smokeless tobacco. He reports that he does not drink alcohol or use illicit drugs.   Family history:   Family History  Problem Relation Age of Onset  . COPD Mother   . COPD Father     ROS:  Please see the history of present illness.  All other systems reviewed and negative.   PHYSICAL EXAM: VS:  BP 136/70 mmHg  Pulse 93  Ht 5\' 9"  (1.753 m)  Wt 231 lb (104.781 kg)  BMI 34.10 kg/m2 obese, well developed, in no acute distress.  He is week in my right leg and had difficulty getting from his scooter to the exam table HEENT: Pupils are equal round react to light accommodation extraocular movements are intact.  Neck: no JVDNo cervical lymphadenopathy. Cardiac: irregular rate and rhythmwithout murmurs rubs or gallops. Lungs:  clear to auscultation bilaterally, no wheezing, rhonchi or rales Abd: soft, nontender, positive bowel sounds all quadrants, no hepatosplenomegaly Ext: no lower extremity edema.  2+ left radial pulses. Skin: warm and dry Neuro:  Grossly normal  EKG:  Sinus rhythm with first-degree AV block. Right bundle, bifascicular block and frequent PVCs. Rate 87 bpm    ASSESSMENT AND PLAN:  Problem List Items Addressed This Visit    Second degree AV block, Mobitz type I   Relevant Orders   EKG 12-Lead   Orthostatic hypotension   CAD (coronary artery disease) - Primary   Relevant Orders   EKG 12-Lead     Patient seems to have recovered from his recent hospitalization. On exam he did have a irregular rhythm with check an EKG which showed sinus rhythm with frequent irregular PVCs. There was no second degree AV block.  He appears euvolemic. Blood pressure is actually a little bit elevated. No complaints of angina.  We'll have him follow-up with [name] 1-2 months. It is likely we may see him again in the hospital after tomorrow.

## 2015-06-12 NOTE — Progress Notes (Signed)
Spoke with pt's wife, Rodena Piety for pre-op call. She verified medical and surgical history. She denies any recent chest pain or sob for pt. She states he saw the cardiologist this afternoon for post hospital f/u. She states his fasting blood sugar is running in the 90's. Last A1C was drawn yesterday at the dialysis center, have not gotten the results as of yet.

## 2015-06-13 ENCOUNTER — Encounter (HOSPITAL_COMMUNITY)
Admission: RE | Disposition: A | Discharge status: Skilled Nursing Facility | Payer: Self-pay | Source: Ambulatory Visit | Attending: Orthopedic Surgery

## 2015-06-13 ENCOUNTER — Inpatient Hospital Stay (HOSPITAL_COMMUNITY): Payer: Medicare Other | Admitting: Certified Registered Nurse Anesthetist

## 2015-06-13 ENCOUNTER — Encounter (HOSPITAL_COMMUNITY): Payer: Self-pay | Admitting: *Deleted

## 2015-06-13 ENCOUNTER — Inpatient Hospital Stay (HOSPITAL_COMMUNITY)
Admission: RE | Admit: 2015-06-13 | Discharge: 2015-06-21 | DRG: 239 | Disposition: A | Discharge status: Skilled Nursing Facility | Payer: Medicare Other | Source: Ambulatory Visit | Attending: Orthopedic Surgery | Admitting: Orthopedic Surgery

## 2015-06-13 DIAGNOSIS — N2581 Secondary hyperparathyroidism of renal origin: Secondary | ICD-10-CM | POA: Diagnosis present

## 2015-06-13 DIAGNOSIS — E11319 Type 2 diabetes mellitus with unspecified diabetic retinopathy without macular edema: Secondary | ICD-10-CM | POA: Diagnosis present

## 2015-06-13 DIAGNOSIS — Z951 Presence of aortocoronary bypass graft: Secondary | ICD-10-CM

## 2015-06-13 DIAGNOSIS — M1990 Unspecified osteoarthritis, unspecified site: Secondary | ICD-10-CM | POA: Diagnosis present

## 2015-06-13 DIAGNOSIS — G4730 Sleep apnea, unspecified: Secondary | ICD-10-CM | POA: Diagnosis present

## 2015-06-13 DIAGNOSIS — K219 Gastro-esophageal reflux disease without esophagitis: Secondary | ICD-10-CM | POA: Diagnosis present

## 2015-06-13 DIAGNOSIS — Z79899 Other long term (current) drug therapy: Secondary | ICD-10-CM | POA: Diagnosis not present

## 2015-06-13 DIAGNOSIS — Z992 Dependence on renal dialysis: Secondary | ICD-10-CM | POA: Diagnosis not present

## 2015-06-13 DIAGNOSIS — I5032 Chronic diastolic (congestive) heart failure: Secondary | ICD-10-CM | POA: Diagnosis present

## 2015-06-13 DIAGNOSIS — E039 Hypothyroidism, unspecified: Secondary | ICD-10-CM | POA: Diagnosis present

## 2015-06-13 DIAGNOSIS — D869 Sarcoidosis, unspecified: Secondary | ICD-10-CM | POA: Diagnosis present

## 2015-06-13 DIAGNOSIS — Z85820 Personal history of malignant melanoma of skin: Secondary | ICD-10-CM | POA: Diagnosis not present

## 2015-06-13 DIAGNOSIS — E1122 Type 2 diabetes mellitus with diabetic chronic kidney disease: Secondary | ICD-10-CM | POA: Diagnosis present

## 2015-06-13 DIAGNOSIS — I2510 Atherosclerotic heart disease of native coronary artery without angina pectoris: Secondary | ICD-10-CM | POA: Diagnosis present

## 2015-06-13 DIAGNOSIS — D649 Anemia, unspecified: Secondary | ICD-10-CM | POA: Diagnosis present

## 2015-06-13 DIAGNOSIS — N186 End stage renal disease: Secondary | ICD-10-CM | POA: Diagnosis present

## 2015-06-13 DIAGNOSIS — E11621 Type 2 diabetes mellitus with foot ulcer: Secondary | ICD-10-CM | POA: Diagnosis present

## 2015-06-13 DIAGNOSIS — I132 Hypertensive heart and chronic kidney disease with heart failure and with stage 5 chronic kidney disease, or end stage renal disease: Secondary | ICD-10-CM | POA: Diagnosis present

## 2015-06-13 DIAGNOSIS — E785 Hyperlipidemia, unspecified: Secondary | ICD-10-CM | POA: Diagnosis present

## 2015-06-13 DIAGNOSIS — L97529 Non-pressure chronic ulcer of other part of left foot with unspecified severity: Secondary | ICD-10-CM | POA: Diagnosis present

## 2015-06-13 DIAGNOSIS — E1152 Type 2 diabetes mellitus with diabetic peripheral angiopathy with gangrene: Principal | ICD-10-CM | POA: Diagnosis present

## 2015-06-13 DIAGNOSIS — L309 Dermatitis, unspecified: Secondary | ICD-10-CM | POA: Diagnosis present

## 2015-06-13 DIAGNOSIS — L8990 Pressure ulcer of unspecified site, unspecified stage: Secondary | ICD-10-CM | POA: Diagnosis present

## 2015-06-13 DIAGNOSIS — E1169 Type 2 diabetes mellitus with other specified complication: Secondary | ICD-10-CM | POA: Diagnosis present

## 2015-06-13 DIAGNOSIS — Z794 Long term (current) use of insulin: Secondary | ICD-10-CM | POA: Diagnosis not present

## 2015-06-13 DIAGNOSIS — Z825 Family history of asthma and other chronic lower respiratory diseases: Secondary | ICD-10-CM | POA: Diagnosis not present

## 2015-06-13 DIAGNOSIS — I9589 Other hypotension: Secondary | ICD-10-CM | POA: Diagnosis present

## 2015-06-13 DIAGNOSIS — Z89512 Acquired absence of left leg below knee: Secondary | ICD-10-CM | POA: Diagnosis not present

## 2015-06-13 DIAGNOSIS — E6609 Other obesity due to excess calories: Secondary | ICD-10-CM | POA: Diagnosis present

## 2015-06-13 DIAGNOSIS — E1142 Type 2 diabetes mellitus with diabetic polyneuropathy: Secondary | ICD-10-CM | POA: Diagnosis present

## 2015-06-13 DIAGNOSIS — M869 Osteomyelitis, unspecified: Secondary | ICD-10-CM | POA: Diagnosis present

## 2015-06-13 DIAGNOSIS — Z6833 Body mass index (BMI) 33.0-33.9, adult: Secondary | ICD-10-CM

## 2015-06-13 DIAGNOSIS — H5442 Blindness, left eye, normal vision right eye: Secondary | ICD-10-CM | POA: Diagnosis present

## 2015-06-13 DIAGNOSIS — M1000 Idiopathic gout, unspecified site: Secondary | ICD-10-CM | POA: Diagnosis present

## 2015-06-13 DIAGNOSIS — E876 Hypokalemia: Secondary | ICD-10-CM | POA: Diagnosis present

## 2015-06-13 DIAGNOSIS — IMO0002 Reserved for concepts with insufficient information to code with codable children: Secondary | ICD-10-CM

## 2015-06-13 HISTORY — PX: AMPUTATION: SHX166

## 2015-06-13 HISTORY — DX: Gastro-esophageal reflux disease without esophagitis: K21.9

## 2015-06-13 HISTORY — DX: Pneumonia, unspecified organism: J18.9

## 2015-06-13 HISTORY — DX: Polyneuropathy, unspecified: G62.9

## 2015-06-13 HISTORY — DX: Malignant (primary) neoplasm, unspecified: C80.1

## 2015-06-13 LAB — COMPREHENSIVE METABOLIC PANEL
ALT: 19 U/L (ref 17–63)
AST: 31 U/L (ref 15–41)
Albumin: 2 g/dL — ABNORMAL LOW (ref 3.5–5.0)
Alkaline Phosphatase: 129 U/L — ABNORMAL HIGH (ref 38–126)
Anion gap: 12 (ref 5–15)
BILIRUBIN TOTAL: 0.5 mg/dL (ref 0.3–1.2)
BUN: 40 mg/dL — AB (ref 6–20)
CHLORIDE: 99 mmol/L — AB (ref 101–111)
CO2: 27 mmol/L (ref 22–32)
CREATININE: 4.69 mg/dL — AB (ref 0.61–1.24)
Calcium: 8.5 mg/dL — ABNORMAL LOW (ref 8.9–10.3)
GFR, EST AFRICAN AMERICAN: 13 mL/min — AB (ref >60)
GFR, EST NON AFRICAN AMERICAN: 11 mL/min — AB (ref >60)
Glucose, Bld: 89 mg/dL (ref 65–99)
POTASSIUM: 3.4 mmol/L — AB (ref 3.5–5.1)
Sodium: 138 mmol/L (ref 135–145)
TOTAL PROTEIN: 6 g/dL — AB (ref 6.5–8.1)

## 2015-06-13 LAB — CBC
HCT: 32.3 % — ABNORMAL LOW (ref 39.0–52.0)
Hemoglobin: 10.2 g/dL — ABNORMAL LOW (ref 13.0–17.0)
MCH: 31.2 pg (ref 26.0–34.0)
MCHC: 31.6 g/dL (ref 30.0–36.0)
MCV: 98.8 fL (ref 78.0–100.0)
PLATELETS: 463 10*3/uL — AB (ref 150–400)
RBC: 3.27 MIL/uL — ABNORMAL LOW (ref 4.22–5.81)
RDW: 13.6 % (ref 11.5–15.5)
WBC: 6.9 10*3/uL (ref 4.0–10.5)

## 2015-06-13 LAB — PROTIME-INR
INR: 1.08 (ref 0.00–1.49)
PROTHROMBIN TIME: 14.2 s (ref 11.6–15.2)

## 2015-06-13 LAB — GLUCOSE, CAPILLARY
GLUCOSE-CAPILLARY: 107 mg/dL — AB (ref 65–99)
Glucose-Capillary: 109 mg/dL — ABNORMAL HIGH (ref 65–99)
Glucose-Capillary: 66 mg/dL (ref 65–99)
Glucose-Capillary: 144 mg/dL — ABNORMAL HIGH (ref 65–99)
Glucose-Capillary: 274 mg/dL — ABNORMAL HIGH (ref 65–99)

## 2015-06-13 LAB — APTT: aPTT: 35 seconds (ref 24–37)

## 2015-06-13 SURGERY — AMPUTATION BELOW KNEE
Anesthesia: Monitor Anesthesia Care | Site: Leg Lower | Laterality: Left

## 2015-06-13 MED ORDER — PHENYLEPHRINE HCL 10 MG/ML IJ SOLN
INTRAMUSCULAR | Status: DC | PRN
  Administered 2015-06-13: 80 ug via INTRAVENOUS
  Administered 2015-06-13: 40 ug via INTRAVENOUS

## 2015-06-13 MED ORDER — DEXTROSE 50 % IV SOLN
INTRAVENOUS | Status: AC
  Administered 2015-06-13: 25 mL via INTRAVENOUS
  Filled 2015-06-13: qty 50

## 2015-06-13 MED ORDER — ONDANSETRON HCL 4 MG PO TABS
4.0000 mg | ORAL_TABLET | Freq: Four times a day (QID) | ORAL | Status: DC | PRN
  Administered 2015-06-14: 4 mg via ORAL
  Filled 2015-06-13: qty 1

## 2015-06-13 MED ORDER — CEFAZOLIN SODIUM-DEXTROSE 2-3 GM-% IV SOLR
2.0000 g | Freq: Once | INTRAVENOUS | Status: AC
  Administered 2015-06-14: 2 g via INTRAVENOUS
  Filled 2015-06-13: qty 50

## 2015-06-13 MED ORDER — MIDAZOLAM HCL 2 MG/2ML IJ SOLN
INTRAMUSCULAR | Status: AC
Start: 2015-06-13 — End: 2015-06-13
  Filled 2015-06-13: qty 2

## 2015-06-13 MED ORDER — POTASSIUM CHLORIDE CRYS ER 20 MEQ PO TBCR: 20.0000 meq | EXTENDED_RELEASE_TABLET | Freq: Every day | ORAL | Status: DC

## 2015-06-13 MED ORDER — LIDOCAINE-EPINEPHRINE (PF) 1.5 %-1:200000 IJ SOLN
INTRAMUSCULAR | Status: DC | PRN
  Administered 2015-06-13 (×2): 12.5 mL via PERINEURAL

## 2015-06-13 MED ORDER — OXYCODONE HCL 5 MG PO TABS
5.0000 mg | ORAL_TABLET | ORAL | Status: DC | PRN
  Administered 2015-06-13 – 2015-06-16 (×10): 10 mg via ORAL
  Filled 2015-06-13 (×12): qty 2

## 2015-06-13 MED ORDER — ALLOPURINOL 100 MG PO TABS: 100.0000 mg | ORAL_TABLET | Freq: Every day | ORAL | Status: DC

## 2015-06-13 MED ORDER — FENTANYL CITRATE (PF) 100 MCG/2ML IJ SOLN
INTRAMUSCULAR | Status: DC | PRN
  Administered 2015-06-13: 50 ug via INTRAVENOUS

## 2015-06-13 MED ORDER — HYDROMORPHONE HCL 1 MG/ML IJ SOLN: 1.0000 mg | INTRAMUSCULAR | Status: DC | PRN

## 2015-06-13 MED ORDER — ACETAMINOPHEN 650 MG RE SUPP: 650.0000 mg | Freq: Four times a day (QID) | RECTAL | Status: DC | PRN

## 2015-06-13 MED ORDER — HEPARIN 1000 UNIT/ML FOR PERITONEAL DIALYSIS
1500.0000 [IU] | INTRAMUSCULAR | Status: DC | PRN
  Filled 2015-06-13: qty 1.5

## 2015-06-13 MED ORDER — BUPIVACAINE-EPINEPHRINE (PF) 0.5% -1:200000 IJ SOLN
INTRAMUSCULAR | Status: DC | PRN
  Administered 2015-06-13 (×2): 12.5 mL

## 2015-06-13 MED ORDER — METHOCARBAMOL 1000 MG/10ML IJ SOLN
500.0000 mg | Freq: Four times a day (QID) | INTRAVENOUS | Status: DC | PRN
  Filled 2015-06-13: qty 5

## 2015-06-13 MED ORDER — INSULIN ASPART 100 UNIT/ML ~~LOC~~ SOLN
3.0000 [IU] | Freq: Three times a day (TID) | SUBCUTANEOUS | Status: DC
  Administered 2015-06-13 – 2015-06-20 (×11): 3 [IU] via SUBCUTANEOUS

## 2015-06-13 MED ORDER — MAGNESIUM CITRATE PO SOLN
1.0000 | Freq: Once | ORAL | Status: DC | PRN
Start: 2015-06-13 — End: 2015-06-14

## 2015-06-13 MED ORDER — POTASSIUM CHLORIDE CRYS ER 20 MEQ PO TBCR
20.0000 meq | EXTENDED_RELEASE_TABLET | Freq: Two times a day (BID) | ORAL | Status: DC
  Administered 2015-06-13 – 2015-06-18 (×11): 20 meq via ORAL
  Filled 2015-06-13 (×11): qty 1

## 2015-06-13 MED ORDER — CEFAZOLIN SODIUM-DEXTROSE 2-3 GM-% IV SOLR
INTRAVENOUS | Status: AC
  Filled 2015-06-13: qty 50

## 2015-06-13 MED ORDER — SODIUM CHLORIDE 0.9 % IV SOLN
INTRAVENOUS | Status: DC
  Administered 2015-06-13: 16:00:00 via INTRAVENOUS

## 2015-06-13 MED ORDER — CHLORHEXIDINE GLUCONATE 4 % EX LIQD
60.0000 mL | Freq: Once | CUTANEOUS | Status: DC
Start: 2015-06-13 — End: 2015-06-13

## 2015-06-13 MED ORDER — METOCLOPRAMIDE HCL 10 MG PO TABS: 10.0000 mg | ORAL_TABLET | Freq: Three times a day (TID) | ORAL | Status: DC

## 2015-06-13 MED ORDER — METOCLOPRAMIDE HCL 5 MG PO TABS: 5.0000 mg | ORAL_TABLET | Freq: Three times a day (TID) | ORAL | Status: DC | PRN

## 2015-06-13 MED ORDER — METHOCARBAMOL 500 MG PO TABS
500.0000 mg | ORAL_TABLET | Freq: Four times a day (QID) | ORAL | Status: DC | PRN
  Administered 2015-06-15: 500 mg via ORAL
  Filled 2015-06-13: qty 1

## 2015-06-13 MED ORDER — PROPOFOL 500 MG/50ML IV EMUL
INTRAVENOUS | Status: DC | PRN
  Administered 2015-06-13: 50 ug/kg/min via INTRAVENOUS

## 2015-06-13 MED ORDER — INSULIN GLARGINE 100 UNIT/ML SOLOSTAR PEN: 30.0000 [IU] | PEN_INJECTOR | Freq: Every day | SUBCUTANEOUS | Status: DC

## 2015-06-13 MED ORDER — DELFLEX-LC/1.5% DEXTROSE 346 MOSM/L IP SOLN
Freq: Every day | INTRAPERITONEAL | Status: DC
  Administered 2015-06-13: 18:00:00 via INTRAPERITONEAL
  Administered 2015-06-13 – 2015-06-16 (×12): 3000 mL via INTRAPERITONEAL
  Administered 2015-06-16: 18:00:00 via INTRAPERITONEAL
  Administered 2015-06-16: 3000 mL via INTRAPERITONEAL
  Administered 2015-06-16: 14:00:00 via INTRAPERITONEAL
  Administered 2015-06-16 – 2015-06-17 (×2): 3000 mL via INTRAPERITONEAL
  Administered 2015-06-18: 07:00:00 via INTRAPERITONEAL
  Administered 2015-06-18: 3000 mL via INTRAPERITONEAL
  Administered 2015-06-18 (×2): via INTRAPERITONEAL
  Administered 2015-06-19: 3000 mL via INTRAPERITONEAL
  Administered 2015-06-19: 13:00:00 via INTRAPERITONEAL
  Administered 2015-06-19: 3000 mL via INTRAPERITONEAL

## 2015-06-13 MED ORDER — BISACODYL 10 MG RE SUPP: 10.0000 mg | Freq: Every day | RECTAL | Status: DC | PRN

## 2015-06-13 MED ORDER — POLYETHYLENE GLYCOL 3350 17 G PO PACK: 17.0000 g | PACK | Freq: Every day | ORAL | Status: DC | PRN

## 2015-06-13 MED ORDER — METOCLOPRAMIDE HCL 10 MG PO TABS
10.0000 mg | ORAL_TABLET | Freq: Three times a day (TID) | ORAL | Status: DC
  Administered 2015-06-13 – 2015-06-21 (×21): 10 mg via ORAL
  Filled 2015-06-13: qty 1
  Filled 2015-06-13: qty 2
  Filled 2015-06-13 (×17): qty 1

## 2015-06-13 MED ORDER — ALLOPURINOL 100 MG PO TABS
100.0000 mg | ORAL_TABLET | Freq: Two times a day (BID) | ORAL | Status: DC
  Administered 2015-06-13 – 2015-06-21 (×16): 100 mg via ORAL
  Filled 2015-06-13 (×16): qty 1

## 2015-06-13 MED ORDER — PANTOPRAZOLE SODIUM 40 MG PO TBEC
40.0000 mg | DELAYED_RELEASE_TABLET | Freq: Every day | ORAL | Status: DC
  Administered 2015-06-13 – 2015-06-21 (×9): 40 mg via ORAL
  Filled 2015-06-13 (×9): qty 1

## 2015-06-13 MED ORDER — CEFAZOLIN SODIUM-DEXTROSE 2-3 GM-% IV SOLR
2.0000 g | INTRAVENOUS | Status: AC
  Administered 2015-06-13: 2 g via INTRAVENOUS

## 2015-06-13 MED ORDER — BOOST / RESOURCE BREEZE PO LIQD
1.0000 | Freq: Three times a day (TID) | ORAL | Status: DC
  Administered 2015-06-13 – 2015-06-18 (×7): 1 via ORAL

## 2015-06-13 MED ORDER — DOCUSATE SODIUM 100 MG PO CAPS
100.0000 mg | ORAL_CAPSULE | Freq: Two times a day (BID) | ORAL | Status: DC
  Administered 2015-06-13 – 2015-06-21 (×14): 100 mg via ORAL
  Filled 2015-06-13 (×17): qty 1

## 2015-06-13 MED ORDER — MIDODRINE HCL 5 MG PO TABS
10.0000 mg | ORAL_TABLET | Freq: Three times a day (TID) | ORAL | Status: DC
  Administered 2015-06-13 – 2015-06-21 (×16): 10 mg via ORAL
  Filled 2015-06-13 (×16): qty 2

## 2015-06-13 MED ORDER — ASPIRIN 325 MG PO TABS
325.0000 mg | ORAL_TABLET | Freq: Every day | ORAL | Status: DC
  Administered 2015-06-13 – 2015-06-21 (×9): 325 mg via ORAL
  Filled 2015-06-13 (×9): qty 1

## 2015-06-13 MED ORDER — SODIUM CHLORIDE 0.9 % IV SOLN
INTRAVENOUS | Status: DC | PRN
  Administered 2015-06-13: 12:00:00 via INTRAVENOUS

## 2015-06-13 MED ORDER — LEVOTHYROXINE SODIUM 112 MCG PO TABS
112.0000 ug | ORAL_TABLET | Freq: Every day | ORAL | Status: DC
  Administered 2015-06-14 – 2015-06-21 (×8): 112 ug via ORAL
  Filled 2015-06-13 (×8): qty 1

## 2015-06-13 MED ORDER — ACETAMINOPHEN 325 MG PO TABS
650.0000 mg | ORAL_TABLET | Freq: Four times a day (QID) | ORAL | Status: DC | PRN
  Administered 2015-06-15: 650 mg via ORAL
  Filled 2015-06-13: qty 2

## 2015-06-13 MED ORDER — PANTOPRAZOLE SODIUM 40 MG PO TBEC: 40.0000 mg | DELAYED_RELEASE_TABLET | Freq: Every day | ORAL | Status: DC

## 2015-06-13 MED ORDER — ONDANSETRON HCL 4 MG/2ML IJ SOLN: 4.0000 mg | Freq: Four times a day (QID) | INTRAMUSCULAR | Status: DC | PRN

## 2015-06-13 MED ORDER — 0.9 % SODIUM CHLORIDE (POUR BTL) OPTIME
TOPICAL | Status: DC | PRN
  Administered 2015-06-13: 1000 mL

## 2015-06-13 MED ORDER — FENTANYL CITRATE (PF) 250 MCG/5ML IJ SOLN
INTRAMUSCULAR | Status: AC
Start: 2015-06-13 — End: 2015-06-13
  Filled 2015-06-13: qty 5

## 2015-06-13 MED ORDER — SODIUM CHLORIDE 0.9 % IV SOLN
Freq: Once | INTRAVENOUS | Status: AC
  Administered 2015-06-13: 12:00:00 via INTRAVENOUS

## 2015-06-13 MED ORDER — METOCLOPRAMIDE HCL 5 MG/ML IJ SOLN: 5.0000 mg | Freq: Three times a day (TID) | INTRAMUSCULAR | Status: DC | PRN

## 2015-06-13 MED ORDER — INSULIN ASPART 100 UNIT/ML ~~LOC~~ SOLN
0.0000 [IU] | Freq: Three times a day (TID) | SUBCUTANEOUS | Status: DC
  Administered 2015-06-13: 1 [IU] via SUBCUTANEOUS
  Administered 2015-06-14 (×3): 3 [IU] via SUBCUTANEOUS
  Administered 2015-06-15 (×2): 2 [IU] via SUBCUTANEOUS
  Administered 2015-06-15 – 2015-06-16 (×2): 3 [IU] via SUBCUTANEOUS
  Administered 2015-06-16 (×2): 2 [IU] via SUBCUTANEOUS
  Administered 2015-06-17: 5 [IU] via SUBCUTANEOUS
  Administered 2015-06-17: 2 [IU] via SUBCUTANEOUS
  Administered 2015-06-17: 3 [IU] via SUBCUTANEOUS
  Administered 2015-06-18 – 2015-06-19 (×5): 2 [IU] via SUBCUTANEOUS
  Administered 2015-06-19: 3 [IU] via SUBCUTANEOUS
  Administered 2015-06-20: 2 [IU] via SUBCUTANEOUS

## 2015-06-13 MED ORDER — INSULIN GLARGINE 100 UNIT/ML ~~LOC~~ SOLN
30.0000 [IU] | Freq: Every day | SUBCUTANEOUS | Status: DC
  Administered 2015-06-13 – 2015-06-21 (×8): 30 [IU] via SUBCUTANEOUS
  Filled 2015-06-13 (×9): qty 0.3

## 2015-06-13 MED ORDER — CALCIUM CARBONATE ANTACID 500 MG PO CHEW
2.0000 | CHEWABLE_TABLET | Freq: Three times a day (TID) | ORAL | Status: DC
  Administered 2015-06-13 – 2015-06-21 (×21): 400 mg via ORAL
  Filled 2015-06-13 (×21): qty 2

## 2015-06-13 MED ORDER — DARBEPOETIN ALFA 100 MCG/0.5ML IJ SOSY
100.0000 ug | PREFILLED_SYRINGE | INTRAMUSCULAR | Status: DC
  Administered 2015-06-14 – 2015-06-21 (×2): 100 ug via SUBCUTANEOUS
  Filled 2015-06-13 (×2): qty 0.5

## 2015-06-13 MED ORDER — MIDAZOLAM HCL 5 MG/5ML IJ SOLN
INTRAMUSCULAR | Status: DC | PRN
  Administered 2015-06-13 (×2): 1 mg via INTRAVENOUS

## 2015-06-13 SURGICAL SUPPLY — 36 items
BLADE SAW RECIP 87.9 MT (BLADE) ×3 IMPLANT
BLADE SURG 21 STRL SS (BLADE) ×3 IMPLANT
BNDG COHESIVE 6X5 TAN STRL LF (GAUZE/BANDAGES/DRESSINGS) ×3 IMPLANT
BNDG GAUZE ELAST 4 BULKY (GAUZE/BANDAGES/DRESSINGS) ×3 IMPLANT
COVER SURGICAL LIGHT HANDLE (MISCELLANEOUS) ×3 IMPLANT
CUFF TOURNIQUET SINGLE 34IN LL (TOURNIQUET CUFF) ×3 IMPLANT
CUFF TOURNIQUET SINGLE 44IN (TOURNIQUET CUFF) IMPLANT
DRAPE EXTREMITY T 121X128X90 (DRAPE) ×3 IMPLANT
DRAPE PROXIMA HALF (DRAPES) ×6 IMPLANT
DRAPE U-SHAPE 47X51 STRL (DRAPES) ×3 IMPLANT
DRSG ADAPTIC 3X8 NADH LF (GAUZE/BANDAGES/DRESSINGS) ×3 IMPLANT
DRSG PAD ABDOMINAL 8X10 ST (GAUZE/BANDAGES/DRESSINGS) ×3 IMPLANT
DURAPREP 26ML APPLICATOR (WOUND CARE) ×3 IMPLANT
ELECT REM PT RETURN 9FT ADLT (ELECTROSURGICAL) ×3
ELECTRODE REM PT RTRN 9FT ADLT (ELECTROSURGICAL) ×1 IMPLANT
GAUZE SPONGE 4X4 12PLY STRL (GAUZE/BANDAGES/DRESSINGS) ×3 IMPLANT
GLOVE BIOGEL PI IND STRL 9 (GLOVE) ×1 IMPLANT
GLOVE BIOGEL PI INDICATOR 9 (GLOVE) ×2
GLOVE SURG ORTHO 9.0 STRL STRW (GLOVE) ×3 IMPLANT
GOWN STRL REUS W/ TWL XL LVL3 (GOWN DISPOSABLE) ×2 IMPLANT
GOWN STRL REUS W/TWL XL LVL3 (GOWN DISPOSABLE) ×4
KIT BASIN OR (CUSTOM PROCEDURE TRAY) ×3 IMPLANT
KIT ROOM TURNOVER OR (KITS) ×3 IMPLANT
MANIFOLD NEPTUNE II (INSTRUMENTS) ×3 IMPLANT
NS IRRIG 1000ML POUR BTL (IV SOLUTION) ×3 IMPLANT
PACK GENERAL/GYN (CUSTOM PROCEDURE TRAY) ×3 IMPLANT
PAD ARMBOARD 7.5X6 YLW CONV (MISCELLANEOUS) ×3 IMPLANT
SPONGE LAP 18X18 X RAY DECT (DISPOSABLE) ×3 IMPLANT
STAPLER VISISTAT 35W (STAPLE) ×6 IMPLANT
STOCKINETTE IMPERVIOUS LG (DRAPES) ×3 IMPLANT
SUT SILK 2 0 (SUTURE) ×2
SUT SILK 2-0 18XBRD TIE 12 (SUTURE) ×1 IMPLANT
SUT VIC AB 1 CTX 27 (SUTURE) ×6 IMPLANT
TOWEL OR 17X24 6PK STRL BLUE (TOWEL DISPOSABLE) ×3 IMPLANT
TOWEL OR 17X26 10 PK STRL BLUE (TOWEL DISPOSABLE) ×3 IMPLANT
WATER STERILE IRR 1000ML POUR (IV SOLUTION) IMPLANT

## 2015-06-13 NOTE — Progress Notes (Signed)
[facility]  Patient Status: Active (receiving services up to time of hospitalization)  [facility] is providing the following services: RN  If patient discharges after hours, please call [phone].   [name] 06/13/2015, 3:50 PM

## 2015-06-13 NOTE — Op Note (Signed)
   Date of Surgery: 06/13/2015  INDICATIONS: Mr. Mandala is a 75 y.o.-year-old male who has failed limb salvage with gangrenous changes to the left foot.  PREOPERATIVE DIAGNOSIS: Severe peripheral vascular disease with gangrene left foot  POSTOPERATIVE DIAGNOSIS: Same.  PROCEDURE: Transtibial amputation left  SURGEON: [name], M.D.  ANESTHESIA:  general  IV FLUIDS AND URINE: See anesthesia.  ESTIMATED BLOOD LOSS: Minimal mL.  COMPLICATIONS: None.  DESCRIPTION OF PROCEDURE: The patient was brought to the operating room and underwent a general anesthetic. After adequate levels of anesthesia were obtained patient's lower extremity was prepped using DuraPrep draped into a sterile field. A timeout was called.  A transverse incision was made 11 cm distal to the tibial tubercle. This curved proximally and a large posterior flap was created. The tibia was transected 1 cm proximal to the skin incision. The fibula was transected just proximal to the tibial incision. The tibia was beveled anteriorly. A large posterior flap was created. The sciatic nerve was pulled cut and allowed to retract. The vascular bundles were suture ligated with 2-0 silk. The deep and superficial fascial layers were closed using #1 Vicryl. The skin was closed using staples and 2-0 nylon. The wound was covered with Adaptic orthopedic sponges AB dressing Kerlix and Coban. Patient was extubated taken to the PACU in stable condition.   Anticipate discharge to skilled nursing facility on Monday.  Meridee Score, MD [place] 1:07 PM

## 2015-06-13 NOTE — Anesthesia Preprocedure Evaluation (Signed)
Anesthesia Evaluation  Patient identified by MRN, date of birth, ID band Patient awake    Reviewed: Allergy & Precautions, NPO status , Patient's Chart, lab work & pertinent test results  History of Anesthesia Complications Negative for: history of anesthetic complications  Airway Mallampati: II  TM Distance: >3 FB Neck ROM: Full    Dental  (+) Edentulous Upper, Edentulous Lower   Pulmonary sleep apnea and Continuous Positive Airway Pressure Ventilation ,    breath sounds clear to auscultation       Cardiovascular hypertension, Pt. on medications + CAD, + CABG, + Peripheral Vascular Disease and +CHF   Rhythm:Regular     Neuro/Psych negative neurological ROS  negative psych ROS   GI/Hepatic GERD  Controlled and Medicated,  Endo/Other  diabetes, Type 2, Insulin DependentHypothyroidism   Renal/GU ESRF and DialysisRenal disease     Musculoskeletal   Abdominal   Peds  Hematology  (+) anemia ,   Anesthesia Other Findings   Reproductive/Obstetrics                             Anesthesia Physical  Anesthesia Plan  ASA: III  Anesthesia Plan: MAC and Regional   Post-op Pain Management:    Induction: Intravenous  Airway Management Planned: Simple Face Mask, Natural Airway and Nasal Cannula  Additional Equipment: None  Intra-op Plan:   Post-operative Plan:   Informed Consent: I have reviewed the patients History and Physical, chart, labs and discussed the procedure including the risks, benefits and alternatives for the proposed anesthesia with the patient or authorized representative who has indicated his/her understanding and acceptance.     Plan Discussed with: CRNA  Anesthesia Plan Comments:         Anesthesia Quick Evaluation

## 2015-06-13 NOTE — H&P (Signed)
Jack Chung is an 75 y.o. male.   Chief Complaint: Ostomy myelitis abscess ulceration left foot HPI: Patient is a 75 year old gentleman with diabetic insensate neuropathy end-stage renal disease on dialysis who was undergone limb salvage intervention for his left foot. Due to failure of limb salvage patient presents this time for transtibial amputation. On examination patient has abscess ulceration osteomyelitis left foot  Past Medical History  Diagnosis Date  . Hypertension   . Hypothyroidism   . CHF (congestive heart failure) ([place])   . Anemia   . Blind left eye   . Sleep apnea     not on CPAP  . Coronary artery disease     CABG x 11 Jun 2009.  MRSA infections of incsions  . Arthritis   . Sarcoidosis ([place])     Hx:of  . Diabetic retinopathy ([place])     Hx: of bilateral  . Edema   . Hyperlipidemia   . 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 Atherosclerosis of native arteries of the extremities, unspecified   . Eczema     Hx: of  . Insomnia   . Seasonal allergies   . History of blood transfusion     with heart surgery  . Coronary atherosclerosis of native coronary artery   . Atherosclerosis of native arteries of the extremities, unspecified   . Pneumonia   . Diabetes mellitus     type 2  . ESRD on dialysis [facility])     peritoneal dialysis - 5 times a time 9a, 12n, 3p, 6p, 9p  . GERD (gastroesophageal reflux disease)   . Neuropathy ([place])   . Cancer ([place])     skin cancer - melanoma on back    Past Surgical History  Procedure Laterality Date  . Cardiac surgery      total of 6 surgeries, 5 related to mrsa  . Coronary artery bypass graft  06/2009  . Cataract surgery      Hx: of  . Debridements      Hx: of secondary to MRSA  . Av fistula placement Right 10/11/2012    Procedure: ARTERIOVENOUS (AV) FISTULA CREATION;  Surgeon: Rosetta Posner, MD;  Location: [facility];  Service: Vascular;  Laterality: Right;  . Insertion of dialysis catheter Left   . Capd  insertion N/A 11/28/2012    Procedure: LAPAROSCOPIC PERITONEAL DIALYSIS CATHETER PLACEMENT;  Surgeon: Ralene Ok, MD;  Location: [facility];  Service: General;  Laterality: N/A;  . Left heart catheterization with coronary/graft angiogram N/A 01/31/2014    Procedure: LEFT HEART CATHETERIZATION WITH Beatrix Fetters;  Surgeon: Troy Sine, MD;  Location: [facility] CATH LAB;  Service: Cardiovascular;  Laterality: N/A;  . Peripheral vascular catheterization N/A 04/28/2015    Procedure: Abdominal Aortogram w/Lower Extremity;  Surgeon: Conrad [place], MD;  Location: [facility] CV LAB;  Service: Cardiovascular;  Laterality: N/A;  . Amputation Left 04/30/2015    Procedure: 1st Ray Amputation Left Foot;  Surgeon: Newt Minion, MD;  Location: [facility];  Service: Orthopedics;  Laterality: Left;  . Esophagogastroduodenoscopy N/A 05/20/2015    Procedure: ESOPHAGOGASTRODUODENOSCOPY (EGD);  Surgeon: Wilford Corner, MD;  Location: [facility] ENDOSCOPY;  Service: Endoscopy;  Laterality: N/A;  Possible esophageal dilatation  . Eye surgery Bilateral     cataract surgery with lens implants    Family History  Problem Relation Age of Onset  . COPD Mother   . COPD Father    Social History:  reports that he has never  smoked. He has never used smokeless tobacco. He reports that he does not drink alcohol or use illicit drugs.  Allergies:  Allergies  Allergen Reactions  . Flu Virus Vaccine     Gets the flu, patient has bad reaction to the flu vaccine.  . Lipitor [Atorvastatin]     Leg cramps    No prescriptions prior to admission    No results found for this or any previous visit (from the past 48 hour(s)). No results found.  Review of Systems  All other systems reviewed and are negative.   There were no vitals taken for this visit. Physical Exam   Assessment/Plan Assessment: Abscess ulceration left lower extremity.  Plan: We'll plan for a left transtibial amputation. Risk and benefits were discussed  including risk of the wound not healing. Patient states he understands wishes to proceed at this time.  [name] V 06/13/2015, 6:34 AM

## 2015-06-13 NOTE — Progress Notes (Signed)
CBG=66. D50% HALF AMP GIVEN BY ALICIA CRNA.

## 2015-06-13 NOTE — Consult Note (Signed)
Jack Chung is an 75 y.o. male referred by Dr [name]   Chief Complaint: ESRD, PD, hyperpo4, hypokalemia, anemia HPI:  75yo WM with ESRD on CAPD who is admitted following Lt BKA for osteomyelitis Lt foot.  Pt on CAPD, using all 1.5%, 5 exchanges/d, 3L fill volume.  Pt awake and alert post op and denies any pain.   Past Medical History  Diagnosis Date  . Hypertension   . Hypothyroidism   . CHF (congestive heart failure) ([place])   . Anemia   . Blind left eye   . Sleep apnea     not on CPAP  . Coronary artery disease     CABG x 11 Jun 2009.  MRSA infections of incsions  . Arthritis   . Sarcoidosis ([place])     Hx:of  . Diabetic retinopathy ([place])     Hx: of bilateral  . Edema   . Hyperlipidemia   . 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 Atherosclerosis of native arteries of the extremities, unspecified   . Eczema     Hx: of  . Insomnia   . Seasonal allergies   . History of blood transfusion     with heart surgery  . Coronary atherosclerosis of native coronary artery   . Atherosclerosis of native arteries of the extremities, unspecified   . Pneumonia   . Diabetes mellitus     type 2  . ESRD on dialysis [facility])     peritoneal dialysis - 5 times a time 9a, 12n, 3p, 6p, 9p  . GERD (gastroesophageal reflux disease)   . Neuropathy ([place])   . Cancer ([place])     skin cancer - melanoma on back    Past Surgical History  Procedure Laterality Date  . Cardiac surgery      total of 6 surgeries, 5 related to mrsa  . Coronary artery bypass graft  06/2009  . Cataract surgery      Hx: of  . Debridements      Hx: of secondary to MRSA  . Av fistula placement Right 10/11/2012    Procedure: ARTERIOVENOUS (AV) FISTULA CREATION;  Surgeon: Rosetta Posner, MD;  Location: [facility];  Service: Vascular;  Laterality: Right;  . Insertion of dialysis catheter Left   . Capd insertion N/A 11/28/2012    Procedure: LAPAROSCOPIC PERITONEAL DIALYSIS CATHETER PLACEMENT;  Surgeon: Ralene Ok,  MD;  Location: [facility];  Service: General;  Laterality: N/A;  . Left heart catheterization with coronary/graft angiogram N/A 01/31/2014    Procedure: LEFT HEART CATHETERIZATION WITH Beatrix Fetters;  Surgeon: Troy Sine, MD;  Location: [facility] CATH LAB;  Service: Cardiovascular;  Laterality: N/A;  . Peripheral vascular catheterization N/A 04/28/2015    Procedure: Abdominal Aortogram w/Lower Extremity;  Surgeon: Conrad The Meadows, MD;  Location: [place] [facility] CV LAB;  Service: Cardiovascular;  Laterality: N/A;  . Amputation Left 04/30/2015    Procedure: 1st Ray Amputation Left Foot;  Surgeon: Newt Minion, MD;  Location: [facility];  Service: Orthopedics;  Laterality: Left;  . Esophagogastroduodenoscopy N/A 05/20/2015    Procedure: ESOPHAGOGASTRODUODENOSCOPY (EGD);  Surgeon: Wilford Corner, MD;  Location: [facility] ENDOSCOPY;  Service: Endoscopy;  Laterality: N/A;  Possible esophageal dilatation  . Eye surgery Bilateral     cataract surgery with lens implants    Family History  Problem Relation Age of Onset  . COPD Mother   . COPD Father    Social History:  reports that he has never smoked. He  has never used smokeless tobacco. He reports that he does not drink alcohol or use illicit drugs.  Allergies:  Allergies  Allergen Reactions  . Flu Virus Vaccine     Gets the flu, patient has bad reaction to the flu vaccine.  . Lipitor [Atorvastatin]     Leg cramps    Medications Prior to Admission  Medication Sig Dispense Refill  . acidophilus (RISAQUAD) CAPS capsule Take 2 capsules by mouth daily. 60 capsule 0  . allopurinol (ZYLOPRIM) 100 MG tablet Take 100 mg by mouth daily.    Marland Kitchen aspirin 325 MG tablet Take 325 mg by mouth daily.    . calcium carbonate (TUMS - DOSED IN MG ELEMENTAL CALCIUM) 500 MG chewable tablet Chew 2 tablets by mouth 3 (three) times daily with meals.     . cetirizine (ZYRTEC) 10 MG tablet Take 10 mg by mouth as needed.     . feeding supplement (BOOST / RESOURCE BREEZE) LIQD Take 1  Container by mouth 3 (three) times daily between meals. 90 Container 0  . gentamicin cream (GARAMYCIN) 0.1 % Apply 1 application topically at bedtime.    . insulin aspart (NOVOLOG) 100 UNIT/ML injection Inject 10-15 Units into the skin 3 (three) times daily with meals. Take none if blood sugar < 150, 151-200 2 units, 201-250 4 units, 251-300 6 units, 301-350 8 units    . Insulin Glargine (LANTUS) 100 UNIT/ML Solostar Pen Inject 45 units into the skin daily. Dx 250.00 (Patient taking differently: 30 Units by Subconjunctival route at bedtime. ) 15 mL 6  . lactulose (CHRONULAC) 10 GM/15ML solution Take 10 g by mouth every 4 (four) hours as needed for mild constipation.    Marland Kitchen levothyroxine (SYNTHROID, LEVOTHROID) 112 MCG tablet LABS/APPOINTMENT OVERDUE, 1 by mouth daily in the morning 30 minutes before breakfast for Thyroid 15 tablet 0  . loperamide (IMODIUM) 2 MG capsule Take 2 mg by mouth daily as needed for diarrhea or loose stools.    . metoCLOPramide (REGLAN) 5 MG tablet Take 2 tablets (10 mg total) by mouth 3 (three) times daily before meals. 180 tablet 0  . midodrine (PROAMATINE) 10 MG tablet Take 1 tablet (10 mg total) by mouth 3 (three) times daily. 270 tablet 3  . Multiple Vitamin (MULTI VITAMIN DAILY PO) Take 1 tablet by mouth daily.    Marland Kitchen omeprazole (PRILOSEC) 10 MG capsule Take 10 mg by mouth daily.    Marland Kitchen oxyCODONE-acetaminophen (PERCOCET/ROXICET) 5-325 MG tablet Take 1-2 tablets by mouth every 6 (six) hours as needed for moderate pain. 30 tablet 0  . PERITONEAL DIALYSIS SOLUTIONS IP Inject 3,000 Units into the peritoneum 5 (five) times daily.     . potassium chloride SA (K-DUR,KLOR-CON) 20 MEQ tablet Take 20 mEq by mouth daily.       Lab Results: UA: ND   Recent Labs  06/13/15 1205  WBC 6.9  HGB 10.2*  HCT 32.3*  PLT 463*   BMET  Recent Labs  06/13/15 1205  NA 138  K 3.4*  CL 99*  CO2 27  GLUCOSE 89  BUN 40*  CREATININE 4.69*  CALCIUM 8.5*   LFT  Recent Labs   06/13/15 1205  PROT 6.0*  ALBUMIN 2.0*  AST 31  ALT 19  ALKPHOS 129*  BILITOT 0.5   No results found.  ROS: No change in vision No SOB No CP No abd pain No dysuria No pain Lt leg   PHYSICAL EXAM: Blood pressure 122/56, pulse 66, temperature 97.4 F (  36.3 C), temperature source Oral, resp. rate 16, height 5\' 9"  (1.753 m), weight 104.781 kg (231 lb), SpO2 97 %. HEENT: PERRLA EOMI NECK:No JVD LUNGS:Clear CARDIAC:RRR wo MRG ABD:+ BS NTND + PD cath wo infection EXT:Lt BKA, wrapped.  No edema RLE.  RUA AVF + bruit NEURO:CNI, Ox3 no asterixis  Assessment: 1. SP Lt BKA 2. ESRD 3. Anemia not on EPO as outpt 4. DM 5. Chronic hypotension, on midodrine PLAN: 1. Will arrange for CAPD 2. Increase KCL to BID for now 3. May need to start ESA 4. DC Mag citrate   Dylan Ruotolo T 06/13/2015, 2:46 PM

## 2015-06-13 NOTE — Anesthesia Procedure Notes (Addendum)
Anesthesia Regional Block:  Femoral nerve block  Pre-Anesthetic Checklist: ,, timeout performed, Correct Patient, Correct Site, Correct Laterality, Correct Procedure, Correct Position, site marked, Risks and benefits discussed,  Surgical consent,  Pre-op evaluation,  At surgeon's request and post-op pain management  Laterality: Left  Prep: chloraprep       Needles:  Injection technique: Single-shot  Needle Type: Echogenic Stimulator Needle     Needle Length: 9cm 9 cm Needle Gauge: 21 and 21 G    Additional Needles:  Procedures: ultrasound guided (picture in chart) and nerve stimulator Femoral nerve block  Nerve Stimulator or Paresthesia:  Response: quadraceps contraction, 0.45 mA,   Additional Responses:   Narrative:  Start time: 06/13/2015 12:14 PM End time: 06/13/2015 12:20 PM Injection made incrementally with aspirations every 5 mL.  Performed by: Personally  Anesthesiologist: Suzette Battiest   Anesthesia Regional Block:  Popliteal block  Pre-Anesthetic Checklist: ,, timeout performed, Correct Patient, Correct Site, Correct Laterality, Correct Procedure, Correct Position, site marked, Risks and benefits discussed,  Surgical consent,  Pre-op evaluation,  At surgeon's request and post-op pain management  Laterality: Left  Prep: chloraprep       Needles:  Injection technique: Single-shot  Needle Type: Echogenic Stimulator Needle     Needle Length: 9cm 9 cm Needle Gauge: 21 and 21 G    Additional Needles:  Procedures: ultrasound guided (picture in chart) and nerve stimulator Popliteal block  Nerve Stimulator or Paresthesia:  Response: tibial, 0.5 mA,   Additional Responses:   Narrative:  Start time: 06/13/2015 12:21 PM End time: 06/13/2015 12:25 PM Injection made incrementally with aspirations every 5 mL.  Performed by: Personally  Anesthesiologist: Suzette Battiest   Procedure Name: MAC Date/Time: 06/13/2015 12:40 PM Performed by: Maryland Pink Pre-anesthesia Checklist: Patient identified, Emergency Drugs available, Suction available, Patient being monitored and Timeout performed Patient Re-evaluated:Patient Re-evaluated prior to inductionOxygen Delivery Method: Simple face mask Preoxygenation: Pre-oxygenation with 100% oxygen Number of attempts: 1 Placement Confirmation: positive ETCO2 Dental Injury: Teeth and Oropharynx as per pre-operative assessment

## 2015-06-13 NOTE — Anesthesia Postprocedure Evaluation (Signed)
Anesthesia Post Note  Patient: [name]  Procedure(s) Performed: Procedure(s) (LRB): Left Below Knee Amputation (Left)  Patient location during evaluation: PACU Anesthesia Type: Regional and MAC Level of consciousness: awake and alert Pain management: pain level controlled Vital Signs Assessment: post-procedure vital signs reviewed and stable Respiratory status: spontaneous breathing Cardiovascular status: blood pressure returned to baseline Anesthetic complications: no    Last Vitals:  Filed Vitals:   06/13/15 1500 06/13/15 1505  BP:  105/62  Pulse: 76 77  Temp:    Resp: 12 13    Last Pain: There were no vitals filed for this visit.               Jack Chung

## 2015-06-13 NOTE — Progress Notes (Signed)
Arrival Method: via stretcher from PACU Mental Status: alert and oriented  Telemetry: n/a Skin: intact, assessment documented Tubes: PD cath LQ IV: L hand, NS @ 50 Pain: denies Family: wife present Living Situation: home with wife  Safety Measures: bed in lowest position, call bell in reach, bed alarm on 6E Orientation: oriented to staff and unit

## 2015-06-13 NOTE — Transfer of Care (Signed)
Immediate Anesthesia Transfer of Care Note  Patient: Jack Chung  Procedure(s) Performed: Procedure(s): Left Below Knee Amputation (Left)  Patient Location: PACU  Anesthesia Type:MAC combined with regional for post-op pain  Level of Consciousness: awake, alert  and oriented  Airway & Oxygen Therapy: Patient Spontanous Breathing  Post-op Assessment: Report given to RN and Post -op Vital signs reviewed and stable  Post vital signs: Reviewed and stable  Last Vitals:  Filed Vitals:   06/13/15 1321 06/13/15 1326  BP: 99/84 116/53  Pulse: 74 76  Temp:    Resp: 18 17    Complications: No apparent anesthesia complications

## 2015-06-14 LAB — GLUCOSE, CAPILLARY
GLUCOSE-CAPILLARY: 221 mg/dL — AB (ref 65–99)
GLUCOSE-CAPILLARY: 227 mg/dL — AB (ref 65–99)
Glucose-Capillary: 217 mg/dL — ABNORMAL HIGH (ref 65–99)
Glucose-Capillary: 239 mg/dL — ABNORMAL HIGH (ref 65–99)

## 2015-06-14 LAB — PHOSPHORUS: Phosphorus: 5.1 mg/dL — ABNORMAL HIGH (ref 2.5–4.6)

## 2015-06-14 NOTE — Progress Notes (Signed)
Patient ID: Jack Chung, male   DOB: 21-Feb-1941, 75 y.o.   MRN: [id number] No acute changes.  Tolerated his left below-knee amputation well yesterday.  Moderate post-op pain.  Left stump dressing clean, dry, and intact.  Will likely need SNF placement pending his mobility.

## 2015-06-14 NOTE — Progress Notes (Signed)
Subjective:  Tolerating CAPD / mild post op pain in BKA  Objective Vital signs in last 24 hours: Filed Vitals:   06/13/15 1505 06/13/15 1542 06/13/15 2043 06/14/15 0511  BP: 105/62 139/58 152/70 131/62  Pulse: 77 77 88 100  Temp:  97.8 F (36.6 C) 97.9 F (36.6 C) 98.2 F (36.8 C)  TempSrc:  Oral Oral Oral  Resp: 13 17 18 18   Height:      Weight:   105.325 kg (232 lb 3.2 oz)   SpO2: 96% 100% 98% 98%   Weight change:   Physical Exam: General: alert /NAd  Heart: RRR no m/r/g Lungs: CTA bilat Abdomen: BS pos. Soft , NT, ND/ PD cath  In tact Extremities: L BKA dressing clean /dry Dialysis Access: no pedal edema  Problem/Plan: 1.  SP  Left BKA - Ortho rx 2. ESRD - Capd  Needs uf./ volume  documentation/ k po bid  3. Anemia - hgb 10.6  aranesp 100mg   q sat started Fu HGB trend 4. Secondary hyperparathyroidism - Ca carb binder 5. HTN/volume - on midorine  For hypotension 139/58 bp this am   Ernest Haber, PA-C [place] [phone] 06/14/2015,10:18 AM  LOS: 1 day  I have seen and examined this patient and agree with plan per Ernest Haber.  Pain fairly well controlled.  Doing well with PD but difficult to tell drain volumes, discussed with charge nurse.  K sl low but I increased his KCL supp yest. Pacey Altizer T,MD 06/14/2015 11:05 AM Labs: Basic Metabolic Panel:  Recent Labs Lab 06/13/15 1205  NA 138  K 3.4*  CL 99*  CO2 27  GLUCOSE 89  BUN 40*  CREATININE 4.69*  CALCIUM 8.5*   Liver Function Tests:  Recent Labs Lab 06/13/15 1205  AST 31  ALT 19  ALKPHOS 129*  BILITOT 0.5  PROT 6.0*  ALBUMIN 2.0*   No results for input(s): LIPASE, AMYLASE in the last 168 hours. No results for input(s): AMMONIA in the last 168 hours. CBC:  Recent Labs Lab 06/13/15 1205  WBC 6.9  HGB 10.2*  HCT 32.3*  MCV 98.8  PLT 463*   Cardiac Enzymes: No results for input(s): CKTOTAL, CKMB, CKMBINDEX, TROPONINI in the last 168 hours. CBG:  Recent  Labs Lab 06/13/15 1322 06/13/15 1354 06/13/15 1702 06/13/15 2038 06/14/15 0744  GLUCAP 66 107* 144* 274* 221*    Studies/Results: No results found. Medications: . sodium chloride 10 mL/hr at 06/13/15 1530   . allopurinol  100 mg Oral BID  . aspirin  325 mg Oral Daily  . calcium carbonate  2 tablet Oral TID WC  .  ceFAZolin (ANCEF) IV  2 g Intravenous Once  . darbepoetin (ARANESP) injection - NON-DIALYSIS  100 mcg Subcutaneous Q Sat-1800  . dialysis solution 1.5% low-MG/low-CA   Intraperitoneal 5 X Daily  . docusate sodium  100 mg Oral BID  . feeding supplement  1 Container Oral TID BM  . insulin aspart  0-9 Units Subcutaneous TID WC  . insulin aspart  3 Units Subcutaneous TID WC  . insulin glargine  30 Units Subcutaneous QHS  . levothyroxine  112 mcg Oral QAC breakfast  . metoCLOPramide  10 mg Oral TID AC  . midodrine  10 mg Oral TID AC  . pantoprazole  40 mg Oral Daily  . potassium chloride  20 mEq Oral BID

## 2015-06-14 NOTE — Progress Notes (Deleted)
Patient's O2 sats dropped to 86% while ambulating on 4L O2.

## 2015-06-14 NOTE — Progress Notes (Signed)
Rehab Admissions Coordinator Note:  Patient was screened by Cleatrice Burke for appropriateness for an Inpatient Acute Rehab Consult per PT recommendation.   At this time, we are recommending an OT eval and an inpt rehab consult for possible admission to inpt acute rehab.Pt can not be placed in SNF receiving CAPD.  Cleatrice Burke 06/14/2015, 7:33 PM  I can be reached at [phone].

## 2015-06-14 NOTE — Evaluation (Signed)
Physical Therapy Evaluation Patient Details Name: DEVVIN ZWICKER MRN: [id number] DOB: [birth year] Today's Date: 06/14/2015   History of Present Illness  Patient is a 75 year old gentleman with diabetic insensate neuropathy end-stage renal disease on dialysis, seen s/p Lt BKA. Hx of CHF, blind left eye, HTN, anemia, CAD, and sarcoidosis.  Clinical Impression  Patient is s/p above surgery presenting with functional limitations due to the deficits listed below (see PT Problem List). Very motivated. Tolerated sliding board transfer with +2 min assist. Reports history of Rt knee OA which will make standing and gait tasks difficult. Educated on positioning and exercises to promote optimal healing. Ambulatory for short distances prior to this admission but uses electric scooter for most mobility. Disabled wife but states she is able to provide some basic care and physical assist with patient's ADLs. Patient will benefit from skilled PT to increase their independence and safety with mobility to allow discharge to the venue listed below.       Follow Up Recommendations CIR    Equipment Recommendations   (TBD next venue of care)    Recommendations for Other Services Rehab consult;OT consult     Precautions / Restrictions Precautions Precautions: Other (comment);Fall (amputation BKA) Restrictions Weight Bearing Restrictions: Yes LLE Weight Bearing: Non weight bearing      Mobility  Bed Mobility Overal bed mobility: Needs Assistance Bed Mobility: Supine to Sit     Supine to sit: Min assist;HOB elevated;+2 for physical assistance     General bed mobility comments: Educated on utilization of rails to assist with rise to EOB, Min assist for truncal support and to spin with helicopter technique through bed pad and +2 support.  Transfers Overall transfer level: Needs assistance Equipment used:  (Sliding board) Transfers: Lateral/Scoot Transfers          Lateral/Scoot Transfers: With slide  board;Min assist;+2 physical assistance General transfer comment: Lateral scoot with education for use of sliding board towards pts right side. VC for technique, and use of RLE as able. Demonstrates good UE strength and ability to scoot with min assist +2. Multiple repositioning and adjustments thoughout but steady with assist from PT.  Ambulation/Gait                Stairs            Wheelchair Mobility    Modified Rankin (Stroke Patients Only)       Balance Overall balance assessment: Needs assistance Sitting-balance support: No upper extremity supported Sitting balance-Leahy Scale: Fair                                       Pertinent Vitals/Pain Pain Assessment: 0-10 Pain Score: 7  Pain Location: Lt residual limb Pain Descriptors / Indicators: Aching Pain Intervention(s): Monitored during session;Repositioned;Limited activity within patient's tolerance;Patient requesting pain meds-RN notified    Home Living Family/patient expects to be discharged to:: Private residence Living Arrangements: Spouse/significant other Available Help at Discharge: Family;Available 24 hours/day (wife is disabled but is able to help pt ) Type of Home: Apartment Home Access: Ramped entrance     Home Layout: One level Home Equipment: [place] - 2 wheels;Shower seat;Electric scooter;Cane - single point;Bedside commode      Prior Function Level of Independence: Needs assistance   Gait / Transfers Assistance Needed: walking short distances in house, uses electric scooter in and out of house primarily.  ADL's / Homemaking Assistance Needed:  Wife assisted with bath/dress        Hand Dominance   Dominant Hand: Right    Extremity/Trunk Assessment   Upper Extremity Assessment: Defer to OT evaluation           Lower Extremity Assessment: RLE deficits/detail;LLE deficits/detail RLE Deficits / Details: Reports hx of significant pain from OA. Difficulty with full  extension. LLE Deficits / Details: Limited assessment due to pain. Nothing abnormal post op BKA noted. difficulty with full extension.     Communication   Communication: No difficulties  Cognition Arousal/Alertness: Awake/alert Behavior During Therapy: WFL for tasks assessed/performed Overall Cognitive Status: Within Functional Limits for tasks assessed                      General Comments General comments (skin integrity, edema, etc.): Educated on post-op BKA care for residual limb including positioning for optimal healing and tactile sensation techniques to reduce phantom limb and pain sensations.    Exercises Amputee Exercises Quad Sets: AROM;Left;10 reps;Supine Hip ABduction/ADduction: AROM;Left;10 reps;Supine Hip Flexion/Marching: Strengthening;Both;10 reps;Seated Knee Extension: AROM;Left;10 reps;Seated      Assessment/Plan    PT Assessment Patient needs continued PT services  PT Diagnosis Difficulty walking;Generalized weakness;Acute pain   PT Problem List Decreased strength;Decreased range of motion;Decreased activity tolerance;Decreased balance;Decreased mobility;Decreased knowledge of use of DME;Decreased knowledge of precautions;Pain;Obesity  PT Treatment Interventions DME instruction;Functional mobility training;Therapeutic activities;Therapeutic exercise;Balance training;Neuromuscular re-education;Patient/family education;Modalities;Wheelchair mobility training   PT Goals (Current goals can be found in the Care Plan section) Acute Rehab PT Goals Patient Stated Goal: Get stronger so I can go home PT Goal Formulation: With patient Time For Goal Achievement: 06/28/15 Potential to Achieve Goals: Good    Frequency Min 3X/week   Barriers to discharge Decreased caregiver support wief can provide some assist but is disabled and physical assist will be limited.    Co-evaluation               End of Session Equipment Utilized During Treatment: Gait  belt Activity Tolerance: Patient tolerated treatment well Patient left: in chair;with call bell/phone within reach;with chair alarm set Nurse Communication: Mobility status;Patient requests pain meds (sliding board transfer)         Time: [id number] PT Time Calculation (min) (ACUTE ONLY): 17 min   Charges:   PT Evaluation $PT Eval Moderate Complexity: 1 Procedure     PT G CodesEllouise Newer 06/14/2015, 12:53 PM Camille Bal [place], [place]

## 2015-06-15 LAB — RENAL FUNCTION PANEL
ALBUMIN: 1.8 g/dL — AB (ref 3.5–5.0)
ANION GAP: 11 (ref 5–15)
BUN: 31 mg/dL — ABNORMAL HIGH (ref 6–20)
CHLORIDE: 94 mmol/L — AB (ref 101–111)
CO2: 27 mmol/L (ref 22–32)
Calcium: 8.1 mg/dL — ABNORMAL LOW (ref 8.9–10.3)
Creatinine, Ser: 4.41 mg/dL — ABNORMAL HIGH (ref 0.61–1.24)
GFR, EST AFRICAN AMERICAN: 14 mL/min — AB (ref >60)
GFR, EST NON AFRICAN AMERICAN: 12 mL/min — AB (ref >60)
Glucose, Bld: 186 mg/dL — ABNORMAL HIGH (ref 65–99)
PHOSPHORUS: 5.3 mg/dL — AB (ref 2.5–4.6)
POTASSIUM: 3.9 mmol/L (ref 3.5–5.1)
Sodium: 132 mmol/L — ABNORMAL LOW (ref 135–145)

## 2015-06-15 LAB — CBC
HEMATOCRIT: 30 % — AB (ref 39.0–52.0)
HEMOGLOBIN: 9.3 g/dL — AB (ref 13.0–17.0)
MCH: 30.8 pg (ref 26.0–34.0)
MCHC: 31 g/dL (ref 30.0–36.0)
MCV: 99.3 fL (ref 78.0–100.0)
Platelets: 365 10*3/uL (ref 150–400)
RBC: 3.02 MIL/uL — ABNORMAL LOW (ref 4.22–5.81)
RDW: 13.8 % (ref 11.5–15.5)
WBC: 8.4 10*3/uL (ref 4.0–10.5)

## 2015-06-15 LAB — GLUCOSE, CAPILLARY
GLUCOSE-CAPILLARY: 151 mg/dL — AB (ref 65–99)
GLUCOSE-CAPILLARY: 163 mg/dL — AB (ref 65–99)
GLUCOSE-CAPILLARY: 208 mg/dL — AB (ref 65–99)
GLUCOSE-CAPILLARY: 180 mg/dL — AB (ref 65–99)

## 2015-06-15 MED ORDER — GENTAMICIN SULFATE 0.1 % EX CREA
TOPICAL_CREAM | CUTANEOUS | Status: DC | PRN
  Administered 2015-06-16: 06:00:00 via TOPICAL
  Filled 2015-06-15: qty 15

## 2015-06-15 NOTE — Progress Notes (Signed)
S: Co pain in Lt stump.  Pulling just 100cc with PD though he says he typically only pulls 100-300cc O:BP 159/96 mmHg  Pulse 107  Temp(Src) 98.6 F (37 C) (Oral)  Resp 18  Ht 5\' 9"  (1.753 m)  Wt 102.649 kg (226 lb 4.8 oz)  BMI 33.40 kg/m2  SpO2 99%  Intake/Output Summary (Last 24 hours) at 06/15/15 0949 Last data filed at 06/15/15 0815  Gross per 24 hour  Intake  12420 ml  Output  12600 ml  Net   -180 ml   Weight change: -2.132 kg (-4 lb 11.2 oz) [id number] and alert CVS:RRR Resp:clear Abd:+ BS NTND  PD cath in place Ext: Lt BKA, wrapped.  No edema on RT NEURO:CNI  Ox3 no asterixis   . allopurinol  100 mg Oral BID  . aspirin  325 mg Oral Daily  . calcium carbonate  2 tablet Oral TID WC  . darbepoetin (ARANESP) injection - NON-DIALYSIS  100 mcg Subcutaneous Q Sat-1800  . dialysis solution 1.5% low-MG/low-CA   Intraperitoneal 5 X Daily  . docusate sodium  100 mg Oral BID  . feeding supplement  1 Container Oral TID BM  . insulin aspart  0-9 Units Subcutaneous TID WC  . insulin aspart  3 Units Subcutaneous TID WC  . insulin glargine  30 Units Subcutaneous QHS  . levothyroxine  112 mcg Oral QAC breakfast  . metoCLOPramide  10 mg Oral TID AC  . midodrine  10 mg Oral TID AC  . pantoprazole  40 mg Oral Daily  . potassium chloride  20 mEq Oral BID   No results found. BMET    Component Value Date/Time   NA 132* 06/15/2015 0433   K 3.9 06/15/2015 0433   CL 94* 06/15/2015 0433   CO2 27 06/15/2015 0433   GLUCOSE 186* 06/15/2015 0433   BUN 31* 06/15/2015 0433   CREATININE 4.41* 06/15/2015 0433   CREATININE 4.14* 01/30/2014 1328   CALCIUM 8.1* 06/15/2015 0433   GFRNONAA 12* 06/15/2015 0433   GFRAA 14* 06/15/2015 0433   CBC    Component Value Date/Time   WBC 8.4 06/15/2015 0433   RBC 3.02* 06/15/2015 0433   RBC 3.34* 10/05/2012 0340   HGB 9.3* 06/15/2015 0433   HCT 30.0* 06/15/2015 0433   PLT 365 06/15/2015 0433   MCV 99.3 06/15/2015 0433   MCH 30.8 06/15/2015 0433    MCHC 31.0 06/15/2015 0433   RDW 13.8 06/15/2015 0433   LYMPHSABS 1.6 05/18/2015 1138   MONOABS 0.7 05/18/2015 1138   EOSABS 0.1 05/18/2015 1138   BASOSABS 0.1 05/18/2015 1138     Assessment:  1. SP Lt BKA 2.  ESRD on CAPD  Using all 1.5% 3. Anemia on aranesp 4. Chronic hypotension on midodrine. BP very good but wife says if he doesn't take the midodrine BP drops  5. DM 6. Hypokalemia, improved on Supp  Plan: 1. Cont with all 1.5% bags 2. Cont with K BID (only on q d as outot) 3. Aranesp just started, was not on it as outpt 4. Being eval for inpt rehab   Tejal Monroy T

## 2015-06-15 NOTE — Progress Notes (Signed)
Gentamicin cream on order from pharmacy.

## 2015-06-15 NOTE — Progress Notes (Signed)
Patient ID: Jack Chung, male   DOB: [birth year], 75 y.o.   MRN: [id number] No acute changes.  Vitals and labs stable.  Stump dressing clean and dry.  Inpatient Rehab has been recommended.  Will order OT.

## 2015-06-16 ENCOUNTER — Encounter (HOSPITAL_COMMUNITY): Payer: Self-pay | Admitting: Orthopedic Surgery

## 2015-06-16 DIAGNOSIS — E1142 Type 2 diabetes mellitus with diabetic polyneuropathy: Secondary | ICD-10-CM | POA: Diagnosis present

## 2015-06-16 DIAGNOSIS — Z89512 Acquired absence of left leg below knee: Secondary | ICD-10-CM

## 2015-06-16 DIAGNOSIS — N186 End stage renal disease: Secondary | ICD-10-CM

## 2015-06-16 DIAGNOSIS — Z992 Dependence on renal dialysis: Secondary | ICD-10-CM

## 2015-06-16 LAB — RENAL FUNCTION PANEL
Albumin: 1.6 g/dL — ABNORMAL LOW (ref 3.5–5.0)
Anion gap: 11 (ref 5–15)
BUN: 32 mg/dL — ABNORMAL HIGH (ref 6–20)
CO2: 26 mmol/L (ref 22–32)
Calcium: 8.4 mg/dL — ABNORMAL LOW (ref 8.9–10.3)
Chloride: 94 mmol/L — ABNORMAL LOW (ref 101–111)
Creatinine, Ser: 5.07 mg/dL — ABNORMAL HIGH (ref 0.61–1.24)
GFR calc Af Amer: 12 mL/min — ABNORMAL LOW (ref >60)
GFR calc non Af Amer: 10 mL/min — ABNORMAL LOW (ref >60)
Glucose, Bld: 237 mg/dL — ABNORMAL HIGH (ref 65–99)
Phosphorus: 5.3 mg/dL — ABNORMAL HIGH (ref 2.5–4.6)
Potassium: 3.9 mmol/L (ref 3.5–5.1)
Sodium: 131 mmol/L — ABNORMAL LOW (ref 135–145)

## 2015-06-16 LAB — CBC
HCT: 30.8 % — ABNORMAL LOW (ref 39.0–52.0)
Hemoglobin: 9.6 g/dL — ABNORMAL LOW (ref 13.0–17.0)
MCH: 31.2 pg (ref 26.0–34.0)
MCHC: 31.2 g/dL (ref 30.0–36.0)
MCV: 100 fL (ref 78.0–100.0)
Platelets: 365 10*3/uL (ref 150–400)
RBC: 3.08 MIL/uL — ABNORMAL LOW (ref 4.22–5.81)
RDW: 13.7 % (ref 11.5–15.5)
WBC: 11.4 10*3/uL — ABNORMAL HIGH (ref 4.0–10.5)

## 2015-06-16 LAB — POCT I-STAT 4, (NA,K, GLUC, HGB,HCT)
GLUCOSE: 90 mg/dL (ref 65–99)
HCT: 32 % — ABNORMAL LOW (ref 39.0–52.0)
Hemoglobin: 10.9 g/dL — ABNORMAL LOW (ref 13.0–17.0)
Potassium: 3.3 mmol/L — ABNORMAL LOW (ref 3.5–5.1)
Sodium: 137 mmol/L (ref 135–145)

## 2015-06-16 LAB — GLUCOSE, CAPILLARY
GLUCOSE-CAPILLARY: 162 mg/dL — AB (ref 65–99)
GLUCOSE-CAPILLARY: 182 mg/dL — AB (ref 65–99)
GLUCOSE-CAPILLARY: 237 mg/dL — AB (ref 65–99)
Glucose-Capillary: 261 mg/dL — ABNORMAL HIGH (ref 65–99)

## 2015-06-16 MED ORDER — RENA-VITE PO TABS
1.0000 | ORAL_TABLET | Freq: Every day | ORAL | Status: DC
  Administered 2015-06-16 – 2015-06-21 (×5): 1 via ORAL
  Filled 2015-06-16 (×6): qty 1

## 2015-06-16 MED ORDER — PRO-STAT SUGAR FREE PO LIQD
30.0000 mL | Freq: Two times a day (BID) | ORAL | Status: DC
  Administered 2015-06-16 – 2015-06-21 (×10): 30 mL via ORAL
  Filled 2015-06-16 (×12): qty 30

## 2015-06-16 MED ORDER — NEPRO/CARBSTEADY PO LIQD
237.0000 mL | Freq: Two times a day (BID) | ORAL | Status: DC
  Administered 2015-06-16 – 2015-06-18 (×4): 237 mL via ORAL

## 2015-06-16 NOTE — Consult Note (Signed)
Physical Medicine and Rehabilitation Consult Reason for Consult: Left transtibial amputation Referring Physician: [name]   HPI: Jack Chung is a 75 y.o. right handed male with history of end-stage renal disease on CAPD, chronic hypertension, diabetes mellitus peripheral neuropathy, CAD with CABG, diastolic congestive heart failure and recent  first Ray amputation left foot 04/30/2015.Marland Kitchen Patient lives with spouse. One level apartment with ramped entrance. Walk short distances in the home with assistive device primarily uses electric scooter. Presented 06/13/2015 with poor healing abscess ulceration osteomyelitis left foot underwent left transtibial amputation 06/12/2014 per [name]. Hospital course pain management. Renal service follow-up with CAPD ongoing. Acute on chronic anemia 9.3 and monitored. Physical therapy evaluation completed 06/14/2015 with recommendations of physical medicine rehabilitation consult.  Patient has mild to moderate pain in the stump area. Therapy is getting ready to work with him again this morning. He has not had a bowel movement since surgery. He does not make much urine due to his renal disease.  Review of Systems  Constitutional: Negative for fever and chills.  HENT: Negative for hearing loss.   Eyes: Negative for blurred vision and double vision.  Respiratory: Negative for cough.        Shortness of breath with exertion  Cardiovascular: Positive for leg swelling. Negative for chest pain and palpitations.  Gastrointestinal: Positive for constipation. Negative for nausea and vomiting.       GERD  Genitourinary: Negative for hematuria.  Musculoskeletal: Positive for myalgias and joint pain.  Skin: Negative for rash.  Neurological: Positive for dizziness. Negative for seizures, loss of consciousness, weakness and headaches.  Psychiatric/Behavioral: The patient has insomnia.   All other systems reviewed and are negative.  Past Medical History    Diagnosis Date  . Hypertension   . Hypothyroidism   . CHF (congestive heart failure) ([place])   . Anemia   . Blind left eye   . Sleep apnea     not on CPAP  . Coronary artery disease     CABG x 11 Jun 2009.  MRSA infections of incsions  . Arthritis   . Sarcoidosis ([place])     Hx:of  . Diabetic retinopathy ([place])     Hx: of bilateral  . Edema   . Hyperlipidemia   . 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 Atherosclerosis of native arteries of the extremities, unspecified   . Eczema     Hx: of  . Insomnia   . Seasonal allergies   . History of blood transfusion     with heart surgery  . Coronary atherosclerosis of native coronary artery   . Atherosclerosis of native arteries of the extremities, unspecified   . Pneumonia   . Diabetes mellitus     type 2  . ESRD on dialysis [facility])     peritoneal dialysis - 5 times a time 9a, 12n, 3p, 6p, 9p  . GERD (gastroesophageal reflux disease)   . Neuropathy ([place])   . Cancer ([place])     skin cancer - melanoma on back   Past Surgical History  Procedure Laterality Date  . Cardiac surgery      total of 6 surgeries, 5 related to mrsa  . Coronary artery bypass graft  06/2009  . Cataract surgery      Hx: of  . Debridements      Hx: of secondary to MRSA  . Av fistula placement Right 10/11/2012    Procedure: ARTERIOVENOUS (AV) FISTULA CREATION;  Surgeon: Rosetta Posner, MD;  Location: [place];  Service: Vascular;  Laterality: Right;  . Insertion of dialysis catheter Left   . Capd insertion N/A 11/28/2012    Procedure: LAPAROSCOPIC PERITONEAL DIALYSIS CATHETER PLACEMENT;  Surgeon: Ralene Ok, MD;  Location: [place];  Service: General;  Laterality: N/A;  . Left heart catheterization with coronary/graft angiogram N/A 01/31/2014    Procedure: LEFT HEART CATHETERIZATION WITH Beatrix Fetters;  Surgeon: Troy Sine, MD;  Location: [facility] CATH LAB;  Service: Cardiovascular;  Laterality: N/A;  . Peripheral vascular  catheterization N/A 04/28/2015    Procedure: Abdominal Aortogram w/Lower Extremity;  Surgeon: Conrad Cumberland, MD;  Location: [place] CV LAB;  Service: Cardiovascular;  Laterality: N/A;  . Amputation Left 04/30/2015    Procedure: 1st Ray Amputation Left Foot;  Surgeon: Newt Minion, MD;  Location: [place];  Service: Orthopedics;  Laterality: Left;  . Esophagogastroduodenoscopy N/A 05/20/2015    Procedure: ESOPHAGOGASTRODUODENOSCOPY (EGD);  Surgeon: Wilford Corner, MD;  Location[facility] ENDOSCOPY;  Service: Endoscopy;  Laterality: N/A;  Possible esophageal dilatation  . Eye surgery Bilateral     cataract surgery with lens implants   Family History  Problem Relation Age of Onset  . COPD Mother   . COPD Father    Social History:  reports that he has never smoked. He has never used smokeless tobacco. He reports that he does not drink alcohol or use illicit drugs. Allergies:  Allergies  Allergen Reactions  . Flu Virus Vaccine     Gets the flu, patient has bad reaction to the flu vaccine.  . Lipitor [Atorvastatin]     Leg cramps   Medications Prior to Admission  Medication Sig Dispense Refill  . acidophilus (RISAQUAD) CAPS capsule Take 2 capsules by mouth daily. 60 capsule 0  . allopurinol (ZYLOPRIM) 100 MG tablet Take 100 mg by mouth daily.    Marland Kitchen aspirin 325 MG tablet Take 325 mg by mouth daily.    . calcium carbonate (TUMS - DOSED IN MG ELEMENTAL CALCIUM) 500 MG chewable tablet Chew 2 tablets by mouth 3 (three) times daily with meals.     . cetirizine (ZYRTEC) 10 MG tablet Take 10 mg by mouth as needed.     . feeding supplement (BOOST / RESOURCE BREEZE) LIQD Take 1 Container by mouth 3 (three) times daily between meals. 90 Container 0  . gentamicin cream (GARAMYCIN) 0.1 % Apply 1 application topically at bedtime.    . insulin aspart (NOVOLOG) 100 UNIT/ML injection Inject 10-15 Units into the skin 3 (three) times daily with meals. Take none if blood sugar < 150, 151-200 2 units, 201-250 4  units, 251-300 6 units, 301-350 8 units    . Insulin Glargine (LANTUS) 100 UNIT/ML Solostar Pen Inject 45 units into the skin daily. Dx 250.00 (Patient taking differently: 30 Units by Subconjunctival route at bedtime. ) 15 mL 6  . lactulose (CHRONULAC) 10 GM/15ML solution Take 10 g by mouth every 4 (four) hours as needed for mild constipation.    Marland Kitchen levothyroxine (SYNTHROID, LEVOTHROID) 112 MCG tablet LABS/APPOINTMENT OVERDUE, 1 by mouth daily in the morning 30 minutes before breakfast for Thyroid 15 tablet 0  . loperamide (IMODIUM) 2 MG capsule Take 2 mg by mouth daily as needed for diarrhea or loose stools.    . metoCLOPramide (REGLAN) 5 MG tablet Take 2 tablets (10 mg total) by mouth 3 (three) times daily before meals. 180 tablet 0  . midodrine (PROAMATINE) 10 MG tablet Take 1  tablet (10 mg total) by mouth 3 (three) times daily. 270 tablet 3  . Multiple Vitamin (MULTI VITAMIN DAILY PO) Take 1 tablet by mouth daily.    Marland Kitchen omeprazole (PRILOSEC) 10 MG capsule Take 10 mg by mouth daily.    Marland Kitchen oxyCODONE-acetaminophen (PERCOCET/ROXICET) 5-325 MG tablet Take 1-2 tablets by mouth every 6 (six) hours as needed for moderate pain. 30 tablet 0  . PERITONEAL DIALYSIS SOLUTIONS IP Inject 3,000 Units into the peritoneum 5 (five) times daily.     . potassium chloride SA (K-DUR,KLOR-CON) 20 MEQ tablet Take 20 mEq by mouth daily.      Home: Home Living Family/patient expects to be discharged to:: Private residence Living Arrangements: Spouse/significant other Available Help at Discharge: Family, Available 24 hours/day (wife is disabled but is able to help pt ) Type of Home: Apartment Home Access: Ramped entrance Home Layout: One level Bathroom Shower/Tub: Multimedia programmer: Handicapped height Bathroom Accessibility: Yes Home Equipment: Environmental consultant - 2 wheels, Shower seat, Transport planner, Radio producer - single point, Bedside commode  Functional History: Prior Function Level of Independence: Needs  assistance Gait / Transfers Assistance Needed: walking short distances in house, uses electric scooter in and out of house primarily. ADL's / Homemaking Assistance Needed: Wife assisted with bath/dress Functional Status:  Mobility: Bed Mobility Overal bed mobility: Needs Assistance Bed Mobility: Supine to Sit Supine to sit: Min assist, HOB elevated, +2 for physical assistance General bed mobility comments: Educated on utilization of rails to assist with rise to EOB, Min assist for truncal support and to spin with helicopter technique through bed pad and +2 support. Transfers Overall transfer level: Needs assistance Equipment used:  (Sliding board) Transfers: Lateral/Scoot Transfers  Lateral/Scoot Transfers: With slide board, Min assist, +2 physical assistance General transfer comment: Lateral scoot with education for use of sliding board towards pts right side. VC for technique, and use of RLE as able. Demonstrates good UE strength and ability to scoot with min assist +2. Multiple repositioning and adjustments thoughout but steady with assist from PT.      ADL:    Cognition: Cognition Overall Cognitive Status: Within Functional Limits for tasks assessed Orientation Level: Oriented X4 Cognition Arousal/Alertness: Awake/alert Behavior During Therapy: WFL for tasks assessed/performed Overall Cognitive Status: Within Functional Limits for tasks assessed  Blood pressure 127/56, pulse 86, temperature 99.6 F (37.6 C), temperature source Oral, resp. rate 16, height 5\' 9"  (1.753 m), weight 101.424 kg (223 lb 9.6 oz), SpO2 96 %. Physical Exam  Constitutional: He is oriented to person, place, and time.  HENT:  Head: Normocephalic.  Eyes: EOM are normal.  Neck: Normal range of motion. Neck supple. No thyromegaly present.  Cardiovascular: Normal rate and regular rhythm.   Respiratory: Effort normal and breath sounds normal. No respiratory distress.  GI: Soft. Bowel sounds are normal. He  exhibits no distension.  Neurological: He is alert and oriented to person, place, and time.  Skin:  BKA site is dressed appropriately tender  Motor strength is 5/5 Left deltoid, biceps, triceps, grip, 4/5 in the right grip, 5 in the deltoid biceps triceps, 4 at the right ankle dorsiflexor plantar flexor 5 and hip flexor and knee extensor, left hip flexors to minus limited by pain. Sensation reduced to light touch in the right foot.  Results for orders placed or performed during the hospital encounter of 06/13/15 (from the past 24 hour(s))  Glucose, capillary     Status: Abnormal   Collection Time: 06/15/15  8:14 AM  Result  Value Ref Range   Glucose-Capillary 151 (H) 65 - 99 mg/dL  Glucose, capillary     Status: Abnormal   Collection Time: 06/15/15 12:00 PM  Result Value Ref Range   Glucose-Capillary 163 (H) 65 - 99 mg/dL  Glucose, capillary     Status: Abnormal   Collection Time: 06/15/15  4:26 PM  Result Value Ref Range   Glucose-Capillary 208 (H) 65 - 99 mg/dL  Glucose, capillary     Status: Abnormal   Collection Time: 06/15/15  9:31 PM  Result Value Ref Range   Glucose-Capillary 180 (H) 65 - 99 mg/dL   No results found.  Assessment/Plan: Diagnosis: Left transtibial amputation due to peripheral vascular disease in a patient with severe diabetic peripheral neuropathy 1. Does the need for close, 24 hr/day medical supervision in concert with the patient's rehab needs make it unreasonable for this patient to be served in a less intensive setting? Yes 2. Co-Morbidities requiring supervision/potential complications: Coronary artery disease status post CABG, end-stage renal disease 3. Due to bladder management, bowel management, safety, skin/wound care, disease management, medication administration, pain management and patient education, does the patient require 24 hr/day rehab nursing? Yes 4. Does the patient require coordinated care of a physician, rehab nurse, PT (1-2 hrs/day, 5  days/week), OT (1-2 hrs/day, 5 days/week)  to address physical and functional deficits in the context of the above medical diagnosis(es)? Yes Addressing deficits in the following areas: balance, endurance, locomotion, strength, transferring, bowel/bladder control, bathing, dressing, feeding, grooming and toileting 5. Can the patient actively participate in an intensive therapy program of at least 3 hrs of therapy per day at least 5 days per week? Yes 6. The potential for patient to make measurable gains while on inpatient rehab is excellent 7. Anticipated functional outcomes upon discharge from inpatient rehab are modified independent  with PT, modified independent with OT, n/a with SLP. 8. Estimated rehab length of stay to reach the above functional goals is: 7-10 days 9. Does the patient have adequate social supports and living environment to accommodate these discharge functional goals? Potentially 10. Anticipated D/C setting: Home 11. Anticipated post D/C treatments: [place] therapy 12. Overall Rehab/Functional Prognosis: good  RECOMMENDATIONS: This patient's condition is appropriate for continued rehabilitative care in the following setting: CIR Patient has agreed to participate in recommended program. Yes Note that insurance prior authorization may be required for reimbursement for recommended care.  Comment: PT, OT reevaluation this morning if at a supervision to modified independent will not need rehabilitation    06/16/2015

## 2015-06-16 NOTE — Progress Notes (Signed)
Patient ID: Jack Chung, male   DOB: 18-Aug-1940, 75 y.o.   MRN: [id number] Patient may be a good candidate for inpatient rehabilitation. Occupational therapy has been consulted. We'll need clearance by PT OT and insurance for inpatient rehabilitation.

## 2015-06-16 NOTE — Evaluation (Signed)
Occupational Therapy Evaluation Patient Details Name: Jack Chung MRN: [id number] DOB: 1940/08/13 Today's Date: 06/16/2015    History of Present Illness Patient is a 75 year old gentleman with diabetic insensate neuropathy end-stage renal disease on dialysis, seen s/p Lt BKA. Hx of CHF, blind left eye, HTN, anemia, CAD, and sarcoidosis.   Clinical Impression   Patient is s/p L BKA surgery resulting in functional limitations due to the deficits listed below (see OT problem list). Pt requires (A) for all adls at this time due to L BKA . Pt will require drop arm 3n1 and w/c. Patient will benefit from skilled OT acutely to increase independence and safety with ADLS to allow discharge CIR.     Follow Up Recommendations  CIR    Equipment Recommendations  Other (comment);Wheelchair cushion (measurements OT);Wheelchair (measurements OT) (drop arm toilet)    Recommendations for Other Services Rehab consult     Precautions / Restrictions Precautions Precautions: Fall Precaution Comments: L BKA Restrictions Weight Bearing Restrictions: Yes LLE Weight Bearing: Non weight bearing      Mobility Bed Mobility Overal bed mobility: Needs Assistance Bed Mobility: Rolling;Sidelying to Sit Rolling: Mod assist Sidelying to sit: Mod assist;+2 for physical assistance       General bed mobility comments: max directional v/c's, assist to elevate trunk, required use of pad to rotate hips   Transfers Overall transfer level: Needs assistance Equipment used:  (2 person lift with bed pad) Transfers: Sit to/from Stand;Lateral/Scoot Transfers Sit to Stand: Max assist;+2 physical assistance        Lateral/Scoot Transfers: Mod assist;+2 physical assistance;With slide board General transfer comment: attempted to stand with patient, maxA, was able to complete standing however not maintain. pt able to help instruct PT and OT on how to complete slide board transfer but required modAx2 to complete    Balance Overall balance assessment: Needs assistance Sitting-balance support: Bilateral upper extremity supported;Feet supported Sitting balance-Leahy Scale: Fair       Standing balance-Leahy Scale: Zero                              ADL Overall ADL's : Needs assistance/impaired Eating/Feeding: Set up;Bed level   Grooming: Oral care;Bed level;Minimal assistance Grooming Details (indicate cue type and reason): pt with dentures loose and wife upset stating "they have lost his cup and paste adn he can't use his teeth"  OT located patients items in beside table and assisting with glue to help dentures adhere. Wife states "oh yeah thats his Presenter, broadcasting Details (indicate cue type and reason): simulated with bed to chair sliding board transfer below           General ADL Comments: pt requires (A) for bed level transfer and oob to chair transfer. pt needs (A) to shift weight and sequence task     Vision     Perception     Praxis      Pertinent Vitals/Pain Pain Assessment: 0-10 Pain Score: 8  Pain Location: L residual limb Pain Descriptors / Indicators: Sharp Pain Intervention(s): Monitored during session;Repositioned     Hand Dominance Right   Extremity/Trunk Assessment Upper Extremity Assessment Upper Extremity Assessment: Generalized weakness   Lower Extremity Assessment Lower Extremity Assessment: LLE deficits/detail LLE Deficits / Details: new BKA   Cervical / Trunk Assessment Cervical / Trunk Assessment: Kyphotic   Communication  Communication Communication: No difficulties   Cognition Arousal/Alertness: Awake/alert Behavior During Therapy: WFL for tasks assessed/performed Overall Cognitive Status: Within Functional Limits for tasks assessed                     General Comments       Exercises Exercises: Amputee     Shoulder Instructions      Home Living Family/patient expects to be discharged  to:: Private residence Living Arrangements: Spouse/significant other Available Help at Discharge: Family;Available 24 hours/day Type of Home: Apartment Home Access: Ramped entrance     Home Layout: One level     Bathroom Shower/Tub: Occupational psychologist: Handicapped height Bathroom Accessibility: Yes   Home Equipment: Environmental consultant - 2 wheels;Shower seat;Electric scooter;Cane - single point;Bedside commode          Prior Functioning/Environment Level of Independence: Needs assistance  Gait / Transfers Assistance Needed: walking short distances in house, uses electric scooter in and out of house primarily. ADL's / Homemaking Assistance Needed: Wife assisted with bath/dress        OT Diagnosis: Generalized weakness;Acute pain   OT Problem List: Decreased strength;Decreased range of motion;Decreased activity tolerance;Impaired balance (sitting and/or standing);Decreased safety awareness;Decreased knowledge of use of DME or AE;Decreased knowledge of precautions;Obesity;Pain   OT Treatment/Interventions: Self-care/ADL training;Therapeutic exercise;Energy conservation;DME and/or AE instruction;Therapeutic activities;Patient/family education;Balance training    OT Goals(Current goals can be found in the care plan section) Acute Rehab OT Goals Patient Stated Goal: to do whatever you tell me to do OT Goal Formulation: With patient Time For Goal Achievement: 06/30/15 Potential to Achieve Goals: Good  OT Frequency: Min 2X/week   Barriers to D/C:            Co-evaluation PT/OT/SLP Co-Evaluation/Treatment: Yes Reason for Co-Treatment: Complexity of the patient's impairments (multi-system involvement);For patient/therapist safety PT goals addressed during session: Mobility/safety with mobility OT goals addressed during session: ADL's and self-care;Proper use of Adaptive equipment and DME      End of Session Equipment Utilized During Treatment: Gait belt;Other (comment)  (sliding board) Nurse Communication: Mobility status;Precautions;Need for lift equipment  Activity Tolerance: Patient tolerated treatment well Patient left: in chair;with call bell/phone within reach;with family/visitor present;with chair alarm set   Time: [id number] OT Time Calculation (min): 35 min Charges:  OT General Charges $OT Visit: 1 Procedure OT Evaluation $OT Eval High Complexity: 1 Procedure G-Codes:    Parke Poisson B 25-Jun-2015, 2:53 PM   Jeri Modena   OTR/L Pager: [phone] Office: [phone] .

## 2015-06-16 NOTE — Progress Notes (Signed)
Rehab admissions - Evaluated for possible admission.  Awaiting OT eval and updated PT progress.  Can then determine need for inpatient rehab.  We will need insurance authorization if therapy notes support inpatient rehab admission.  Call me for questions.  [phone]

## 2015-06-16 NOTE — Progress Notes (Signed)
Jack Chung Progress Note  Assessment/Plan: 1. Left BKA - to tranfer to rehab 2. ESRD - CAPD - on all 1.5s - may need one exchange of a 2.5 - repeat labs- BP is not low 3. Anemia - Hgb 9.3 last tsat 44% on Aranesp 100 sq per Sat 4. Secondary hyperparathyroidism - Ps in 5s without binders 5. HTN/volume - BP 120 - 150 - says he cannot tolerate anything stronger than 1.5s, midodrine 10 tid  6. Nutrition - alb 1.8- on carb mod diet + K supplement - added prostat, nepro and vitamin 7. Post op fever- tmax 101.1 -no cultures done - not on antibioitics;if spikes again , need BC, CXR, urine culture, empiric antibiotics; repeat CBC today for WBC trend  Jack Jacobson, PA-C [place] [phone] 06/16/2015,10:54 AM  LOS: 3 days   Pt seen, examined and agree w A/P as above.  Jack Splinter MD [facility] pager [phone]    cell [phone] 06/16/2015, 3:57 PM    Subjective:  Unhappy about having had BKA  Objective Filed Vitals:   06/15/15 2200 06/16/15 0045 06/16/15 0532 06/16/15 1000  BP:   127/56 151/57  Pulse:   86 97  Temp: 101.1 F (38.4 C) 99.8 F (37.7 C) 99.6 F (37.6 C) 98 F (36.7 C)  TempSrc: Oral Oral Oral Oral  Resp:   16 18  Height:      Weight:      SpO2:   96% 98%   Physical Exam General: chronically ill appearing Heart: RRR Lungs: grossly clear Abdomen: obese soft Extremities: left BKA wrapped; right LE + edema Dialysis Access: PD cath  Dialysis Orders: CAPD  Additional Objective Labs: Basic Metabolic Panel:  Recent Labs Lab 06/13/15 1205 06/13/15 1215 06/14/15 1136 06/15/15 0433  NA 138 137  --  132*  K 3.4* 3.3*  --  3.9  CL 99*  --   --  94*  CO2 27  --   --  27  GLUCOSE 89 90  --  186*  BUN 40*  --   --  31*  CREATININE 4.69*  --   --  4.41*  CALCIUM 8.5*  --   --  8.1*  PHOS  --   --  5.1* 5.3*   Liver Function Tests:  Recent Labs Lab 06/13/15 1205 06/15/15 0433  AST 31  --   ALT  19  --   ALKPHOS 129*  --   BILITOT 0.5  --   PROT 6.0*  --   ALBUMIN 2.0* 1.8*   CBC:  Recent Labs Lab 06/13/15 1205 06/13/15 1215 06/15/15 0433  WBC 6.9  --  8.4  HGB 10.2* 10.9* 9.3*  HCT 32.3* 32.0* 30.0*  MCV 98.8  --  99.3  PLT 463*  --  365    Recent Labs Lab 06/15/15 0814 06/15/15 1200 06/15/15 1626 06/15/15 2131 06/16/15 0749  GLUCAP 151* 163* 208* 180* 162*  Medications:   . allopurinol  100 mg Oral BID  . aspirin  325 mg Oral Daily  . calcium carbonate  2 tablet Oral TID WC  . darbepoetin (ARANESP) injection - NON-DIALYSIS  100 mcg Subcutaneous Q Sat-1800  . dialysis solution 1.5% low-MG/low-CA   Intraperitoneal 5 X Daily  . docusate sodium  100 mg Oral BID  . feeding supplement  1 Container Oral TID BM  . insulin aspart  0-9 Units Subcutaneous TID WC  . insulin aspart  3 Units Subcutaneous TID WC  .  insulin glargine  30 Units Subcutaneous QHS  . levothyroxine  112 mcg Oral QAC breakfast  . metoCLOPramide  10 mg Oral TID AC  . midodrine  10 mg Oral TID AC  . pantoprazole  40 mg Oral Daily  . potassium chloride  20 mEq Oral BID

## 2015-06-16 NOTE — Care Management Note (Signed)
Case Management Note  Patient Details  Name: Jack Chung MRN: [id number] Date of Birth: 1940-07-04  Subjective/Objective:          CM following for progression and d/c planning.          Action/Plan: 06/16/2015 Noted plan for CIR review, await OT eval, PT complete . CM following for CIR review and will assist with d/c as needed. Pt performs peritoneal dialysis therefore is not a candidate for SNF rehab.   Expected Discharge Date:                  Expected Discharge Plan:  [place]  In-House Referral:  NA  Discharge planning Services  CM Consult  Post Acute Care Choice:  NA Choice offered to:  NA  DME Arranged:  N/A DME Agency:  NA  HH Arranged:  NA HH Agency:  NA  Status of Service:  In process, will continue to follow  Medicare Important Message Given:    Date Medicare IM Given:    Medicare IM give by:    Date Additional Medicare IM Given:    Additional Medicare Important Message give by:     If discussed at [place] of Stay Meetings, dates discussed:    Additional Comments:  Adron Bene, RN 06/16/2015, 11:02 AM

## 2015-06-16 NOTE — Care Management Important Message (Signed)
Important Message  Patient Details  Name: Jack Chung MRN: [id number] Date of Birth: [birth year]   Medicare Important Message Given:  Yes    [name] 06/16/2015, 2:09 [place] Message  Patient Details  Name: Jack Chung MRN: [id number] Date of Birth: Jul 13, 1940   Medicare Important Message Given:  Yes    [name] G 06/16/2015, 2:09 PM

## 2015-06-16 NOTE — Progress Notes (Signed)
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 Hx of CHF, blind left eye, HTN, anemia, CAD, and sarcoidosis.    PT Comments    Pt willing to participate in therapy despite severe L residual limb pain. Limited ability to complete L LE exercises due to pain. Pt able to assist with slide board transfer and sit to stand but requires some assist. Pt is an excellent candidate for CIR to achieve mod I w/c level of mobility for safe transition home with spouse.  Follow Up Recommendations  CIR     Equipment Recommendations   (TBD by next venue)    Recommendations for Other Services Rehab consult;OT consult     Precautions / Restrictions Precautions Precautions: Fall (L BKA) Restrictions Weight Bearing Restrictions: Yes LLE Weight Bearing: Non weight bearing    Mobility  Bed Mobility Overal bed mobility: Needs Assistance Bed Mobility: Rolling;Sidelying to Sit Rolling: Mod assist Sidelying to sit: Mod assist;+2 for physical assistance       General bed mobility comments: max directional v/c's, assist to elevate trunk,  Transfers Overall transfer level: Needs assistance Equipment used:  (2 person lift with bed pad) Transfers: Sit to/from Stand;Lateral/Scoot Transfers Sit to Stand: Max assist;+2 physical assistance        Lateral/Scoot Transfers: Mod assist;+2 physical assistance;With slide board General transfer comment: attempted to stand with patient, maxA, was able to complete standing however not maintain. pt able to help instruct PT and OT on how to complete slide board transfer but required modAx2 to complete  Ambulation/Gait                 Stairs            Wheelchair Mobility    Modified Rankin (Stroke Patients Only)        Balance Overall balance assessment: Needs assistance Sitting-balance support: Feet supported;Bilateral upper extremity supported Sitting balance-Leahy Scale: Fair                              Cognition Arousal/Alertness: Awake/alert Behavior During Therapy: WFL for tasks assessed/performed Overall Cognitive Status: Within Functional Limits for tasks assessed                      Exercises Amputee Exercises Quad Sets: AROM;Left;10 reps;Supine Knee Flexion: AROM;Left;10 reps;Seated Knee Extension: AROM;Left;10 reps;Seated    General Comments        Pertinent Vitals/Pain Pain Assessment: 0-10 Pain Score: 8  Pain Location: L residual limb Pain Descriptors / Indicators: Sharp Pain Intervention(s): Monitored during session    Home Living                      Prior Function            PT Goals (current goals can now be found in the care plan section) Acute Rehab PT Goals Patient Stated Goal: Get stronger so I can go home Progress towards PT goals: Progressing toward goals    Frequency  Min 3X/week    PT Plan Current plan remains appropriate    Co-evaluation PT/OT/SLP Co-Evaluation/Treatment: Yes Reason for Co-Treatment: Complexity of the patient's impairments (multi-system involvement);For patient/therapist safety PT goals addressed during session: Mobility/safety with mobility  End of Session Equipment Utilized During Treatment: Gait belt Activity Tolerance: Patient limited by pain Patient left: in chair;with call bell/phone within reach;with chair alarm set     Time: [id number] PT Time Calculation (min) (ACUTE ONLY): 27 min  Charges:  $Therapeutic Activity: 8-22 mins                    G Codes:      [name] 06/16/2015, 2:02 PM   Kittie Plater, PT, DPT Pager #: [phone] Office #: [phone]

## 2015-06-17 LAB — GLUCOSE, CAPILLARY
GLUCOSE-CAPILLARY: 239 mg/dL — AB (ref 65–99)
GLUCOSE-CAPILLARY: 191 mg/dL — AB (ref 65–99)
Glucose-Capillary: 254 mg/dL — ABNORMAL HIGH (ref 65–99)
Glucose-Capillary: 150 mg/dL — ABNORMAL HIGH (ref 65–99)

## 2015-06-17 MED ORDER — HEPARIN 1000 UNIT/ML FOR PERITONEAL DIALYSIS
1500.0000 [IU] | INTRAMUSCULAR | Status: DC | PRN
  Administered 2015-06-17 – 2015-06-19 (×2): 1500 [IU] via INTRAPERITONEAL
  Filled 2015-06-17 (×6): qty 1.5

## 2015-06-17 NOTE — Progress Notes (Signed)
Rehab admissions - I have received a denial from [facility] for acute inpatient rehab admission.  Psychologist, occupational says that patient needs can be met at a lower level of care, such as SNF.  Call me for questions.  #[phone]

## 2015-06-17 NOTE — Progress Notes (Signed)
Rehab admissions - I have faxed information to [facility] carrier yesterday requesting acute inpatient rehab admission.  I hope to hear back from case manager later today about possible inpatient rehab admission.  Call me for questions.  [phone]

## 2015-06-17 NOTE — Progress Notes (Signed)
[facility] Progress Note  Assessment/Plan: 1. Left BKA - awaiting clearance from insurance company for transfer to rehab 2. ESRD - CAPD - on all 1.5s - no change k 3.9 with K supplement; discussed that it may be difficult to get in all exchanges while doing rehab.  He does not want CCPD b/c it interferes with his sleep due to the machine noise. Will see how it goes. 3. Anemia - Hgb 9.6 stable last tsat 44% on Aranesp 100 sq per Sat 4. Secondary hyperparathyroidism - Ps in 5s  5. HTN/volume - BP variable but lower today than yesterday;  says he cannot tolerate anything stronger than 1.5s, midodrine 10 tid  6. Nutrition - alb 1.6 on carb mod diet + K supplement - added prostat, nepro and vitamin - encouraged protein intake 7. Post op fever- tmax lower at 99.3  -no cultures done - not on antibioitics;if spikes again , need BC, CXR, urine culture, empiric antibiotics; WBC up to 11.4 1/9-repeat CBC for Wed. 8. DM - BS in 200s - per primary -  [name], PA-C [place] [phone] 06/17/2015,9:29 AM  LOS: 4 days   Pt seen, examined and agree w A/P as above.  [name] MD [facility] pager [phone]    cell [phone] 06/17/2015, 1:25 PM    Subjective:   No appetite - ate very little of breakfast; denies being depressed.  Accepting of BKA. States sometimes not getting in all 5 CAPD exchanges.   Objective Filed Vitals:   06/16/15 2002 06/17/15 0532 06/17/15 0658 06/17/15 0844  BP: 117/54 99/48  132/55  Pulse: 89 93  91  Temp: 99.2 F (37.3 C) 99.3 F (37.4 C)  99.2 F (37.3 C)  TempSrc: Oral Oral  Oral  Resp: 16 20  19   Height:      Weight:   97.1 kg (214 lb 1.1 oz)   SpO2: 98% 97%  97%   Physical Exam General: NAD in bed Heart: RRR Lungs: grossly clear Abdomen: obese soft nontender+ BS, fluid in belly Extremities:  Right LE sl LE edema, left BKA wrapped Dialysis Access: PD cath  Dialysis Orders: CAPD all 1.5s 5  exchanges Additional Objective Labs: Basic Metabolic Panel:  Recent Labs Lab 06/13/15 1205 06/13/15 1215 06/14/15 1136 06/15/15 0433 06/16/15 1519  NA 138 137  --  132* 131*  K 3.4* 3.3*  --  3.9 3.9  CL 99*  --   --  94* 94*  CO2 27  --   --  27 26  GLUCOSE 89 90  --  186* 237*  BUN 40*  --   --  31* 32*  CREATININE 4.69*  --   --  4.41* 5.07*  CALCIUM 8.5*  --   --  8.1* 8.4*  PHOS  --   --  5.1* 5.3* 5.3*   Liver Function Tests:  Recent Labs Lab 06/13/15 1205 06/15/15 0433 06/16/15 1519  AST 31  --   --   ALT 19  --   --   ALKPHOS 129*  --   --   BILITOT 0.5  --   --   PROT 6.0*  --   --   ALBUMIN 2.0* 1.8* 1.6*   CBC:  Recent Labs Lab 06/13/15 1205 06/13/15 1215 06/15/15 0433 06/16/15 1519  WBC 6.9  --  8.4 11.4*  HGB 10.2* 10.9* 9.3* 9.6*  HCT 32.3* 32.0* 30.0* 30.8*  MCV 98.8  --  99.3 100.0  PLT 463*  --  365 365  CBG:  Recent Labs Lab 06/16/15 0749 06/16/15 1147 06/16/15 1613 06/16/15 2159 06/17/15 0839  GLUCAP 162* 182* 237* 261* 254*   Medications:   . allopurinol  100 mg Oral BID  . aspirin  325 mg Oral Daily  . calcium carbonate  2 tablet Oral TID WC  . darbepoetin (ARANESP) injection - NON-DIALYSIS  100 mcg Subcutaneous Q Sat-1800  . dialysis solution 1.5% low-MG/low-CA   Intraperitoneal 5 X Daily  . docusate sodium  100 mg Oral BID  . feeding supplement  1 Container Oral TID BM  . feeding supplement (NEPRO CARB STEADY)  237 mL Oral BID BM  . feeding supplement (PRO-STAT SUGAR FREE 64)  30 mL Oral BID  . insulin aspart  0-9 Units Subcutaneous TID WC  . insulin aspart  3 Units Subcutaneous TID WC  . insulin glargine  30 Units Subcutaneous QHS  . levothyroxine  112 mcg Oral QAC breakfast  . metoCLOPramide  10 mg Oral TID AC  . midodrine  10 mg Oral TID AC  . multivitamin  1 tablet Oral QHS  . pantoprazole  40 mg Oral Daily  . potassium chloride  20 mEq Oral BID

## 2015-06-17 NOTE — Progress Notes (Signed)
Patient ID: Jack Chung, male   DOB: [birth year], 75 y.o.   MRN: [id number] Physical therapy and occupational therapy have recommended inpatient rehabilitation. Await authorization from insurance company.

## 2015-06-17 NOTE — Clinical Social Work Note (Signed)
Patient/wife requested to see CSW regarding discharge plans. Mrs. Solow aware that patient cannot go to inpatient rehab and this was discussed - BCBS denied CIR. Wife indicated that she cannot care for patient at home as he is weak, however she does handle is peritoneal dialysis treatment at home.  CSW talked with patient/wife about SNF and being unaware of any skilled facility that does PD on site. Patient/wife agreeable to SNF if one will accept him. CSW will f/u with patient/wife on Wednesday and full assessment to follow.   Virna Livengood Givens, MSW, LCSW Licensed Clinical Social Worker [place] [phone]

## 2015-06-18 LAB — RENAL FUNCTION PANEL
Albumin: 1.5 g/dL — ABNORMAL LOW (ref 3.5–5.0)
Anion gap: 13 (ref 5–15)
BUN: 37 mg/dL — ABNORMAL HIGH (ref 6–20)
CO2: 26 mmol/L (ref 22–32)
Calcium: 8.1 mg/dL — ABNORMAL LOW (ref 8.9–10.3)
Chloride: 93 mmol/L — ABNORMAL LOW (ref 101–111)
Creatinine, Ser: 5.34 mg/dL — ABNORMAL HIGH (ref 0.61–1.24)
GFR calc Af Amer: 11 mL/min — ABNORMAL LOW (ref >60)
GFR calc non Af Amer: 10 mL/min — ABNORMAL LOW (ref >60)
Glucose, Bld: 213 mg/dL — ABNORMAL HIGH (ref 65–99)
Phosphorus: 5.1 mg/dL — ABNORMAL HIGH (ref 2.5–4.6)
Potassium: 3.2 mmol/L — ABNORMAL LOW (ref 3.5–5.1)
Sodium: 132 mmol/L — ABNORMAL LOW (ref 135–145)

## 2015-06-18 LAB — CBC
HCT: 28.9 % — ABNORMAL LOW (ref 39.0–52.0)
Hemoglobin: 9.3 g/dL — ABNORMAL LOW (ref 13.0–17.0)
MCH: 32.1 pg (ref 26.0–34.0)
MCHC: 32.2 g/dL (ref 30.0–36.0)
MCV: 99.7 fL (ref 78.0–100.0)
Platelets: 388 10*3/uL (ref 150–400)
RBC: 2.9 MIL/uL — ABNORMAL LOW (ref 4.22–5.81)
RDW: 13.6 % (ref 11.5–15.5)
WBC: 10.2 10*3/uL (ref 4.0–10.5)

## 2015-06-18 LAB — GLUCOSE, CAPILLARY
GLUCOSE-CAPILLARY: 171 mg/dL — AB (ref 65–99)
GLUCOSE-CAPILLARY: 168 mg/dL — AB (ref 65–99)
Glucose-Capillary: 153 mg/dL — ABNORMAL HIGH (ref 65–99)
Glucose-Capillary: 186 mg/dL — ABNORMAL HIGH (ref 65–99)

## 2015-06-18 MED ORDER — POTASSIUM CHLORIDE 20 MEQ PO PACK
20.0000 meq | PACK | Freq: Once | ORAL | Status: DC
Start: 2015-06-18 — End: 2015-06-18

## 2015-06-18 MED ORDER — POTASSIUM CHLORIDE CRYS ER 20 MEQ PO TBCR
20.0000 meq | EXTENDED_RELEASE_TABLET | Freq: Once | ORAL | Status: AC
  Administered 2015-06-18: 20 meq via ORAL
  Filled 2015-06-18: qty 1

## 2015-06-18 NOTE — NC FL2 (Signed)
[place] LEVEL OF CARE SCREENING TOOL     IDENTIFICATION  Patient Name: Jack Chung Birthdate: 09-10-1940 Sex: male Admission Date (Current Location): 06/13/2015  [facility] and [place] Number:  Herbalist and Address:  [facility], [place] [address] [place], [place] [address]      Provider Number: [id number]  Attending Physician Name and Address:  Newt Minion, MD  Relative Name and Phone Number:  Amear Franta - spouse.  Phone - [phone].    Current Level of Care: Hospital Recommended Level of Care: [place] Prior Approval Number:    Date Approved/Denied:   PASRR Number: [id number] (Eff. 06/18/15)  Discharge Plan: SNF    Current Diagnoses: Patient Active Problem List   Diagnosis Date Noted  . Type 2 diabetes mellitus with polyneuropathy ([place]) 06/16/2015  . End-stage renal disease on hemodialysis ([place])   . Below knee amputation status ([place]) 06/13/2015  . Second degree AV block, Mobitz type I 06/12/2015  . Dysphagia 05/20/2015  . Coffee ground emesis 05/19/2015  . Gastroparesis 05/19/2015  . Chronic hypotension 05/19/2015  . DM (diabetes mellitus), type 2 with renal complications ([place]) [id number]  . Diabetic foot infection ([place]) 05/18/2015  . Sepsis ([place]) 05/18/2015  . Gangrene of toe ([place]) 05/02/2015  . Intractable nausea and vomiting 04/23/2015  . Orthostatic hypotension 02/06/2015  . Cough 12/30/2014  . S/P CABG x 3 01/08/2014  . Fever 07/17/2013  . Left flank pain 07/17/2013  . ESRD on dialysis ([place]) 07/17/2013  . Gout 07/17/2013  . CAD (coronary artery disease) 07/17/2013  . CHF (congestive heart failure) ([place]) 10/30/2012  . Sarcoidosis ([place]) 10/30/2012  . Sleep apnea 10/30/2012  . Anemia 10/04/2012    Orientation RESPIRATION BLADDER Height & Weight    Self, Time, Situation, Place  Normal Continent   214 lbs.  BEHAVIORAL SYMPTOMS/MOOD NEUROLOGICAL BOWEL NUTRITION STATUS   Incontinent Diet (Carb modified)  AMBULATORY STATUS COMMUNICATION OF NEEDS Skin   Total Care (Patient has not ambulated with PT due to Left BKA) Verbally Other (Comment) (Left BKA and incision left leg)                       Personal Care Assistance Level of Assistance  Bathing, Feeding, Dressing Bathing Assistance: Limited assistance Feeding assistance: Independent Dressing Assistance: Limited assistance     Functional Limitations Info  Sight, Hearing, Speech Sight Info: Impaired (Blind left eye) Hearing Info: Adequate Speech Info: Adequate    SPECIAL CARE FACTORS FREQUENCY  PT (By licensed PT), OT (By licensed OT)     PT Frequency: Evaluated 1/9. 3X per week recommended OT Frequency: Evaluated 1/9. 2X per week recommended            Contractures Contractures Info: Not present    Additional Factors Info  Code Status, Allergies Code Status Info: Full Code Allergies Info: Flu virus vaccine, Lipitor           Current Medications (06/18/2015):  This is the current hospital active medication list Current Facility-Administered Medications  Medication Dose Route Frequency Provider Last Rate Last Dose  . acetaminophen (TYLENOL) tablet 650 mg  650 mg Oral Q6H PRN Newt Minion, MD   650 mg at 06/15/15 2322   Or  . acetaminophen (TYLENOL) suppository 650 mg  650 mg Rectal Q6H PRN Newt Minion, MD      . allopurinol (ZYLOPRIM) tablet 100 mg  100 mg Oral BID Fleet Contras, MD  100 mg at 06/18/15 1136  . aspirin tablet 325 mg  325 mg Oral Daily Newt Minion, MD   325 mg at 06/18/15 1136  . bisacodyl (DULCOLAX) suppository 10 mg  10 mg Rectal Daily PRN Newt Minion, MD      . calcium carbonate (TUMS - dosed in mg elemental calcium) chewable tablet 400 mg of elemental calcium  2 tablet Oral TID WC Fleet Contras, MD   400 mg of elemental calcium at 06/18/15 1136  . Darbepoetin Alfa (ARANESP) injection 100 mcg  100 mcg Subcutaneous Q Sat-1800 Fleet Contras, MD    100 mcg at 06/14/15 1758  . dialysis solution 1.5% low-MG/low-CA dianeal solution   Intraperitoneal 5 X Daily Fleet Contras, MD      . docusate sodium (COLACE) capsule 100 mg  100 mg Oral BID Newt Minion, MD   100 mg at 06/18/15 1136  . feeding supplement (BOOST / RESOURCE BREEZE) liquid 1 Container  1 Container Oral TID BM Newt Minion, MD   1 Container at 06/17/15 2351  . feeding supplement (NEPRO CARB STEADY) liquid 237 mL  237 mL Oral BID BM Alric Seton, PA-C   237 mL at 06/18/15 1327  . feeding supplement (PRO-STAT SUGAR FREE 64) liquid 30 mL  30 mL Oral BID Alric Seton, PA-C   30 mL at 06/18/15 1136  . gentamicin cream (GARAMYCIN) 0.1 %   Topical PRN Fleet Contras, MD      . heparin 1000 unit/ml injection 1,500 Units  1,500 Units Intraperitoneal PRN Roney Jaffe, MD   1,500 Units at 06/17/15 1540  . HYDROmorphone (DILAUDID) injection 1 mg  1 mg Intravenous Q2H PRN Meridee Score V, MD      . insulin aspart (novoLOG) injection 0-9 Units  0-9 Units Subcutaneous TID WC Newt Minion, MD   2 Units at 06/18/15 1326  . insulin aspart (novoLOG) injection 3 Units  3 Units Subcutaneous TID WC Newt Minion, MD   3 Units at 06/18/15 1327  . insulin glargine (LANTUS) injection 30 Units  30 Units Subcutaneous QHS Newt Minion, MD   30 Units at 06/17/15 2310  . levothyroxine (SYNTHROID, LEVOTHROID) tablet 112 mcg  112 mcg Oral QAC breakfast Newt Minion, MD   112 mcg at 06/18/15 0831  . methocarbamol (ROBAXIN) tablet 500 mg  500 mg Oral Q6H PRN Newt Minion, MD   500 mg at 06/15/15 0853   Or  . methocarbamol (ROBAXIN) 500 mg in dextrose 5 % 50 mL IVPB  500 mg Intravenous Q6H PRN Newt Minion, MD      . metoCLOPramide (REGLAN) tablet 5-10 mg  5-10 mg Oral Q8H PRN Newt Minion, MD       Or  . metoCLOPramide (REGLAN) injection 5-10 mg  5-10 mg Intravenous Q8H PRN Newt Minion, MD      . metoCLOPramide (REGLAN) tablet 10 mg  10 mg Oral TID [facility] Fleet Contras, MD   10 mg at 06/18/15  1137  . midodrine (PROAMATINE) tablet 10 mg  10 mg Oral TID AC Newt Minion, MD   10 mg at 06/18/15 1135  . multivitamin (RENA-VIT) tablet 1 tablet  1 tablet Oral QHS Alric Seton, PA-C   1 tablet at 06/17/15 2349  . ondansetron (ZOFRAN) tablet 4 mg  4 mg Oral Q6H PRN Newt Minion, MD   4 mg at 06/14/15 1558   Or  . ondansetron (ZOFRAN) injection 4 mg  4 mg Intravenous Q6H PRN Newt Minion, MD      . oxyCODONE (Oxy IR/ROXICODONE) immediate release tablet 5-10 mg  5-10 mg Oral Q3H PRN Newt Minion, MD   10 mg at 06/16/15 1304  . pantoprazole (PROTONIX) EC tablet 40 mg  40 mg Oral Daily Fleet Contras, MD   40 mg at 06/18/15 1135  . polyethylene glycol (MIRALAX / GLYCOLAX) packet 17 g  17 g Oral Daily PRN Meridee Score V, MD      . potassium chloride SA (K-DUR,KLOR-CON) CR tablet 20 mEq  20 mEq Oral BID Fleet Contras, MD   20 mEq at 06/18/15 1136  . potassium chloride SA (K-DUR,KLOR-CON) CR tablet 20 mEq  20 mEq Oral Once Newt Minion, MD         Discharge Medications: Please see discharge summary for a list of discharge medications.  Relevant Imaging Results:  Relevant Lab Results:   Additional Information Patient is on Peritoneal dialysis, 7 days a week, 5 times per day: 9 am, 12N, 3 pm, 6 pm and 9 pm.  Wife will do the PD and they have all the needed supplies.  Sable Feil, LCSW

## 2015-06-18 NOTE — Progress Notes (Signed)
[facility] Progress Note  Assessment/Plan: 1. Left BKA -wife appealing insurance company's denial;  He cannot go to a SNF because of being on peritoneal dialysis.  There are no local SNFs that provide this. 2. ESRD - CAPD - on all 1.5s - with K supplement; discussed that it may be difficult to get in all exchanges while doing rehab. He does not want CCPD b/c it interferes with his sleep due to the machine noise. Will see how it goes. K 3.2 Na lowish- will give additional dose KCl today 3. Anemia - Hgb 9.3 stable last tsat 44% on Aranesp 100 sq per Sat - if it continues to trend down will increase on Sat 4. Secondary hyperparathyroidism - Ps in 5s  5. HTN/volume - BP  lower today midodrine 10 tid - has RLE edema 6. Nutrition - alb 1.5 on carb mod diet + K supplement - added prostat, nepro and vitamin - encouraged protein intake BUN is low considering all the supplements he is supposed to be getting; discussed with pt and wife the need to ^ protein intake - advised she can bring him some other ^ pro foods from home if that will improve intake 7. Post op fever- tmax 99.2 -no cultures done - WBC a little lower 8. DM - BS in 200s - per primary -  [name], PA-C [place] [phone] 06/18/2015,12:29 PM  LOS: 5 days   Pt seen, examined and agree w A/P as above.  [name] MD [facility] pager [phone]    cell [phone] 06/18/2015, 2:51 PM    Subjective:   No c/o  Objective Filed Vitals:   06/17/15 1719 06/17/15 2112 06/18/15 0455 06/18/15 0947  BP: 108/52 132/53 112/67 102/43  Pulse: 76 84 82 91  Temp: 98.2 F (36.8 C) 98.6 F (37 C) 98.7 F (37.1 C) 98.4 F (36.9 C)  TempSrc: Oral Oral Oral Oral  Resp: 18 19 20 20   Height:      Weight:  97.4 kg (214 lb 11.7 oz)    SpO2: 99% 100% 100% 100%   Physical Exam General: pale elderly male Heart: RRR Lungs: dim BS  Abdomen: obese distended with fluid in belly from  exchange Extremities:left BKA wrapped and RLE edema Dialysis Access: PD cath  Dialysis Orders: CAPD all 1.5s 5 exchanges  Additional Objective Labs: Basic Metabolic Panel:  Recent Labs Lab 06/15/15 0433 06/16/15 1519 06/18/15 0512  NA 132* 131* 132*  K 3.9 3.9 3.2*  CL 94* 94* 93*  CO2 27 26 26   GLUCOSE 186* 237* 213*  BUN 31* 32* 37*  CREATININE 4.41* 5.07* 5.34*  CALCIUM 8.1* 8.4* 8.1*  PHOS 5.3* 5.3* 5.1*   Liver Function Tests:  Recent Labs Lab 06/13/15 1205 06/15/15 0433 06/16/15 1519 06/18/15 0512  AST 31  --   --   --   ALT 19  --   --   --   ALKPHOS 129*  --   --   --   BILITOT 0.5  --   --   --   PROT 6.0*  --   --   --   ALBUMIN 2.0* 1.8* 1.6* 1.5*   CBC:  Recent Labs Lab 06/13/15 1205  06/15/15 0433 06/16/15 1519 06/18/15 0512  WBC 6.9  --  8.4 11.4* 10.2  HGB 10.2*  < > 9.3* 9.6* 9.3*  HCT 32.3*  < > 30.0* 30.8* 28.9*  MCV 98.8  --  99.3 100.0 99.7  PLT 463*  --  365 365 388  < > = values in this interval not displayed. CBG:  Recent Labs Lab 06/17/15 0839 06/17/15 1219 06/17/15 1718 06/17/15 2111 06/18/15 0741  GLUCAP 254* 239* 191* 150* 171*  Medications:   . allopurinol  100 mg Oral BID  . aspirin  325 mg Oral Daily  . calcium carbonate  2 tablet Oral TID WC  . darbepoetin (ARANESP) injection - NON-DIALYSIS  100 mcg Subcutaneous Q Sat-1800  . dialysis solution 1.5% low-MG/low-CA   Intraperitoneal 5 X Daily  . docusate sodium  100 mg Oral BID  . feeding supplement  1 Container Oral TID BM  . feeding supplement (NEPRO CARB STEADY)  237 mL Oral BID BM  . feeding supplement (PRO-STAT SUGAR FREE 64)  30 mL Oral BID  . insulin aspart  0-9 Units Subcutaneous TID WC  . insulin aspart  3 Units Subcutaneous TID WC  . insulin glargine  30 Units Subcutaneous QHS  . levothyroxine  112 mcg Oral QAC breakfast  . metoCLOPramide  10 mg Oral TID AC  . midodrine  10 mg Oral TID AC  . multivitamin  1 tablet Oral QHS  . pantoprazole  40 mg  Oral Daily  . potassium chloride  20 mEq Oral BID

## 2015-06-18 NOTE — Progress Notes (Signed)
Rehab admissions - I received a call from [employer] requesting additional information.  I have faxed information.  Wife is appealing the denial for inpatient rehab admission.  [employer] has 72 hours to respond to her appeal.  Call me for questions.  [phone]

## 2015-06-18 NOTE — Clinical Social Work Note (Addendum)
Clinical Social Work Assessment  Patient Details  Name: Jack Chung MRN: [id number] Date of Birth: 1940/10/07  Date of referral:  06/17/15               Reason for consult:  Facility Placement                Permission sought to share information with:  Facility Sport and exercise psychologist, Family Supports Permission granted to share information::  Yes, Verbal Permission Granted  Name::     Shaune Seide  Agency::  Skilled nursing facilities  Relationship::  Wife  Contact Information:  [phone]  Housing/Transportation Living arrangements for the past 2 months:  [place] of Information:  Patient, Spouse Patient Interpreter Needed:  None Criminal Activity/Legal Involvement Pertinent to Current Situation/Hospitalization:  No - Comment as needed Significant Relationships:  Spouse Lives with:  Spouse Do you feel safe going back to the place where you live?  No (Mrs. Fayard does not feel that she can care for patient post discharge due to his weakness, and newBKA.) Need for family participation in patient care:  Yes (Comment)  Care giving concerns:  Wife expressed that she cannot care for patient at home post discharge due to his new amputation and weakness.   Social Worker assessment / plan:  On 06/17/15 CSW talked with patient and wife regarding discharge planning. Patient was evaluated by inpatient rehab, but insurance denied CIR for patient. Wife and patient agreeable to a skilled facility and this was discussed. Patient/wife made aware of the challenges of finding a facility that will accept a peritoneal patient, however a SNF search will be initiated and contact made with facilities.  CSW advised that patient has PD 7 days a week and 5 times per day: 9 am, 12 noon, 3 pm, 6 pm and 9 pm and he has supplies at home that will go with him to the nursing facility and Mrs. Labate indicated that she will do the PD for patient at the facility.  Employment status:  Retired Radiation protection practitioner:  Advice worker) PT Recommendations:  [place] / Referral to community resources:  [place] (SNF list for [facility] provided)  Patient/Family's Response to care:  No concerns expressed regarding patient care during hospitalization.  Patient/Family's Understanding of and Emotional Response to Diagnosis, Current Treatment, and Prognosis:  Not discussed.  Emotional Assessment Appearance:  Appears stated age Attitude/Demeanor/Rapport:  Other (Appropriate) Affect (typically observed):  Appropriate, Pleasant Orientation:  Oriented to Self, Oriented to Place, Oriented to  Time, Oriented to Situation Alcohol / Substance use:  Never Used Psych involvement (Current and /or in the community):  No (Comment)  Discharge Needs  Concerns to be addressed:  Discharge Planning Concerns, Other (Comment Required (Finding a facility that will accept a PD patient) Readmission within the last 30 days:  Yes Current discharge risk:  None Barriers to Discharge:  Other (Finding a facility that will accept a PD patient)   Sable Feil, LCSW 06/18/2015, 5:29 PM

## 2015-06-18 NOTE — Progress Notes (Signed)
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 Hx of CHF, blind left eye, HTN, anemia, CAD, and sarcoidosis.    PT Comments    Pt progressing well towards all goals. Pt able to tolerate standing x 3 with maxAx2. Pt and spouse able to demo HEP for L residual limb. Pt con't to be appropriate for CIR as pt demo's excellent rehab potential to achieve mod I at w/c level for safe transition home with spouse.  Follow Up Recommendations  CIR     Equipment Recommendations       Recommendations for Other Services Rehab consult;OT consult     Precautions / Restrictions Precautions Precautions: Fall Precaution Comments: L BKA Restrictions Weight Bearing Restrictions: Yes LLE Weight Bearing: Non weight bearing    Mobility  Bed Mobility Overal bed mobility: Needs Assistance Bed Mobility: Rolling;Sidelying to Sit Rolling: Mod assist Sidelying to sit: Mod assist;+2 for physical assistance       General bed mobility comments: max directional v/c's, assist to achieve sidelying and assist for trunk elevation  Transfers Overall transfer level: Needs assistance Equipment used:  (2 person lift with bed pad and gait belt) Transfers: Sit to/from Stand;Stand Pivot Transfers Sit to Stand: Max assist;+2 physical assistance Stand pivot transfers: Max assist;+2 physical assistance       General transfer comment: completed 3 stands for approximately 10-15 seconds with max encouragement and assist at posterior buttocks to achieve full upright standing. pt able to reach across chair and assist with pulling self over during squat pvt transfer to chair  Ambulation/Gait                 Stairs            Wheelchair Mobility    Modified Rankin (Stroke  Patients Only)       Balance Overall balance assessment: Needs assistance Sitting-balance support: Bilateral upper extremity supported Sitting balance-Leahy Scale: Fair       Standing balance-Leahy Scale: Zero                      Cognition Arousal/Alertness: Awake/alert Behavior During Therapy: WFL for tasks assessed/performed Overall Cognitive Status: Within Functional Limits for tasks assessed                      Exercises Amputee Exercises Quad Sets: AROM;Left;10 reps;Supine Knee Flexion: AROM;Left;10 reps;Seated    General Comments        Pertinent Vitals/Pain Pain Assessment: No/denies pain    Home Living                      Prior Function            PT Goals (current goals can now be found in the care plan section) Progress towards PT goals: Progressing toward goals    Frequency  Min 3X/week    PT Plan Current plan remains appropriate    Co-evaluation PT/OT/SLP Co-Evaluation/Treatment: Yes Reason for Co-Treatment: For patient/therapist safety PT goals addressed during session: Mobility/safety with mobility       End of Session Equipment Utilized During Treatment: Gait belt Activity Tolerance: Patient tolerated treatment well Patient left: in chair;with call bell/phone within reach;with family/visitor present     Time: 1347-1410 PT Time Calculation (min) (ACUTE ONLY):  23 min  Charges:  $Therapeutic Activity: 8-22 mins                    G Codes:      [name] 06/18/2015, 2:28 PM   Kittie Plater, PT, DPT Pager #: [phone] Office #: [phone]

## 2015-06-18 NOTE — Progress Notes (Signed)
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 Hx of CHF, blind left eye, HTN, anemia, CAD, and sarcoidosis.   OT comments  MD PLEASE ORDER GEO MAT CUSHION Electrical engineer can order it through [facility] services for chair transfers) Please write order for OOB daily with staff using lift.   Pt progressing to stand pivot transfer with therapist to chair this session. Hoyer pad placed and recommended staff lift patient at this time. Ot / Pt to continue to work on progressing patient to sliding board transfer level for RN. Wife present for entire session and reports doing exercises with patient daily.   Follow Up Recommendations  CIR    Equipment Recommendations  Other (comment);Wheelchair cushion (measurements OT);Wheelchair (measurements OT)    Recommendations for Other Services Rehab consult    Precautions / Restrictions Precautions Precautions: Fall Precaution Comments: L BKA Restrictions Weight Bearing Restrictions: Yes LLE Weight Bearing: Non weight bearing       Mobility Bed Mobility Overal bed mobility: Needs Assistance Bed Mobility: Rolling;Sidelying to Sit Rolling: Mod assist Sidelying to sit: Mod assist;+2 for physical assistance       General bed mobility comments: progressing to eOB with PT Ashly on arrival  Transfers Overall transfer level: Needs assistance Equipment used: 2 person hand held assist Transfers: Sit to/from Stand Sit to Stand: Max assist;+2 physical assistance Stand pivot transfers: Max assist;+2 physical assistance       General transfer comment: Pt completed transfer from EOB to chair this session using pad. pt completed sit<>stand x3 with pad used to help with hip extension and hip anterior tilt. Pt required third  therapist for full upright posture in standing.     Balance Overall balance assessment: Needs assistance Sitting-balance support: Bilateral upper extremity supported;Feet supported Sitting balance-Leahy Scale: Fair Sitting balance - Comments: able to maintain static sititng and R LE on floor this session without (A)   Standing balance support: Bilateral upper extremity supported;During functional activity Standing balance-Leahy Scale: Zero Standing balance comment: Pt with pad used for hip extension , third therapist helping with scapula depression, adduction and neck extension for upright standing posture total +3                   ADL Overall ADL's : Needs assistance/impaired                         Toilet Transfer: +2 for physical assistance;Maximal assistance;Stand-pivot Toilet Transfer Details (indicate cue type and reason): simulated OOB to chair           General ADL Comments: pt educated on chair push up for UE strengthening and for pressure relief. Pt is unable to clear buttock from chair but is able to off set weight. pt educated on lateral leans for decr pressure while in chair.pt is to sit in chair for 2 hours max and recommend transfer change of position due to wound on buttock. Pt could benefit from GEO MAT cushion ( will recommend). Pt demonstrates UE exercises and LB exercises see below. Pt able to reach L LE in sitting to work on desensitization for phantom pain. Pt reports it does help the pain      Vision  Perception     Praxis      Cognition   Behavior During Therapy: WFL for tasks assessed/performed Overall Cognitive Status: Within Functional Limits for tasks assessed                       Extremity/Trunk Assessment               Exercises General Exercises - Upper Extremity Shoulder Flexion: AROM;Both;10 reps;Seated Amputee Exercises Quad Sets: AROM;Left;10 reps;Supine Hip  ABduction/ADduction: AROM;Left;10 reps;Seated (educated on extension in supine) Knee Flexion: AROM;Left;10 reps;Seated Knee Extension: AROM;Left;5 reps;Seated Chair Push Up: AROM;Both;10 reps;Seated;Limitations Chair Push Up Limitations: strength to push body habitus off chair surface   Shoulder Instructions       General Comments      Pertinent Vitals/ Pain       Pain Assessment: No/denies pain  Home Living                                          Prior Functioning/Environment              Frequency Min 3X/week     Progress Toward Goals  OT Goals(current goals can now be found in the care plan section)  Progress towards OT goals: Progressing toward goals  Acute Rehab OT Goals Patient Stated Goal: to get better OT Goal Formulation: With patient Time For Goal Achievement: 06/30/15 Potential to Achieve Goals: Good ADL Goals Pt Will Perform Grooming: with set-up;sitting Pt Will Perform Upper Body Bathing: with set-up;sitting Pt Will Transfer to Toilet: with min assist;with +2 assist;with transfer board;bedside commode Additional ADL Goal #1: Pt will complete BIL UE HEP at mod I level  Plan Discharge plan remains appropriate    Co-evaluation    PT/OT/SLP Co-Evaluation/Treatment: Yes Reason for Co-Treatment: Complexity of the patient's impairments (multi-system involvement);For patient/therapist safety PT goals addressed during session: Mobility/safety with mobility OT goals addressed during session: ADL's and self-care;Strengthening/ROM      End of Session Equipment Utilized During Treatment: Gait belt;Other (comment) (slidng board)   Activity Tolerance Patient tolerated treatment well   Patient Left in chair;with call bell/phone within reach;with family/visitor present;with chair alarm set   Nurse Communication Mobility status;Precautions;Need for lift equipment        Time: [id number] OT Time Calculation (min): 21 min  Charges: OT  General Charges $OT Visit: 1 Procedure OT Treatments $Therapeutic Exercise: 8-22 mins  Parke Poisson B 06/18/2015, 2:42 PM  Jeri Modena   OTR/L Pager: [phone] Office: [phone] .

## 2015-06-19 LAB — BASIC METABOLIC PANEL
Anion gap: 13 (ref 5–15)
BUN: 45 mg/dL — ABNORMAL HIGH (ref 6–20)
CO2: 24 mmol/L (ref 22–32)
Calcium: 7.9 mg/dL — ABNORMAL LOW (ref 8.9–10.3)
Chloride: 95 mmol/L — ABNORMAL LOW (ref 101–111)
Creatinine, Ser: 5.48 mg/dL — ABNORMAL HIGH (ref 0.61–1.24)
GFR calc Af Amer: 11 mL/min — ABNORMAL LOW (ref >60)
GFR calc non Af Amer: 9 mL/min — ABNORMAL LOW (ref >60)
Glucose, Bld: 182 mg/dL — ABNORMAL HIGH (ref 65–99)
Potassium: 2.7 mmol/L — CL (ref 3.5–5.1)
Sodium: 132 mmol/L — ABNORMAL LOW (ref 135–145)

## 2015-06-19 LAB — GLUCOSE, CAPILLARY
GLUCOSE-CAPILLARY: 172 mg/dL — AB (ref 65–99)
GLUCOSE-CAPILLARY: 182 mg/dL — AB (ref 65–99)
Glucose-Capillary: 152 mg/dL — ABNORMAL HIGH (ref 65–99)
Glucose-Capillary: 207 mg/dL — ABNORMAL HIGH (ref 65–99)

## 2015-06-19 MED ORDER — POTASSIUM CHLORIDE CRYS ER 20 MEQ PO TBCR
40.0000 meq | EXTENDED_RELEASE_TABLET | Freq: Three times a day (TID) | ORAL | Status: DC
  Administered 2015-06-19 – 2015-06-21 (×6): 40 meq via ORAL
  Filled 2015-06-19 (×6): qty 2

## 2015-06-19 MED ORDER — POTASSIUM CHLORIDE CRYS ER 20 MEQ PO TBCR: 40.0000 meq | EXTENDED_RELEASE_TABLET | Freq: Two times a day (BID) | ORAL | Status: DC

## 2015-06-19 MED ORDER — ENSURE ENLIVE PO LIQD
237.0000 mL | Freq: Two times a day (BID) | ORAL | Status: DC
  Administered 2015-06-19 – 2015-06-20 (×2): 237 mL via ORAL

## 2015-06-19 NOTE — Progress Notes (Signed)
Spoke with Network engineer at Dr. Jess Barters office requesting order for [place] consult to assess pt's sacrum.

## 2015-06-19 NOTE — Progress Notes (Signed)
Patient and his wife have reluctantly agreed to HD as other options not working out.  He has a functional right upper AVF that has never been used.  Plan to use first round Friday and CLIP - hopefully to [facility] for short term HD. Discussed with MSW  Amalia Hailey, PA-C

## 2015-06-19 NOTE — Progress Notes (Signed)
Rehab admissions - I have received an additional denial from [facility] for inpatient rehab admission.  Call me for questions.  [id number]:[id number]

## 2015-06-19 NOTE — Progress Notes (Signed)
CRITICAL VALUE ALERT  Critical value received:  K 2.7  Date of notification:  06/19/15  Time of notification:  0804  Critical value read back:Yes.    Nurse who received alert:  Larena Glassman, RN  MD notified (1st page):  Dr. Jonnie Finner  Time of first page:  0807  MD notified (2nd page):  Time of second page:  Responding MD:  Dr. Jonnie Finner  Time MD responded:  [phone]

## 2015-06-19 NOTE — Progress Notes (Signed)
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 Hx of CHF, blind left eye, HTN, anemia, CAD, and sarcoidosis.    PT Comments    Modest progress today. Pt limited by increased fatigue and episode of bowel incontinence. Still very motivated to work with physical therapy. Focused on bed mobility, sit to stand and lateral scoot transfers with modified squat to reduce sheer forces upon buttocks. States he has been compliant with exercises daily and wife encourages him frequently. Patient will continue to benefit from skilled physical therapy services to further improve independence with functional mobility.   Follow Up Recommendations  CIR     Equipment Recommendations       Recommendations for Other Services Rehab consult;OT consult     Precautions / Restrictions Precautions Precautions: Fall Precaution Comments: L BKA Restrictions Weight Bearing Restrictions: Yes LLE Weight Bearing: Non weight bearing    Mobility  Bed Mobility Overal bed mobility: Needs Assistance Bed Mobility: Rolling;Sidelying to Sit;Sit to Sidelying Rolling: Mod assist Sidelying to sit: Mod assist     Sit to sidelying: Min assist General bed mobility comments: Mod assist for truncal support to rise to EOB, VC for rail use and to pull through PTs hand. Cues for bringing LEs off of bed.   Transfers Overall transfer level: Needs assistance Equipment used: 2 person hand held assist (2 person lift with bed pad and gait belt) Transfers: Sit to/from Stand;Lateral/Scoot Transfers Sit to Stand: Max assist;+2 physical assistance        Lateral/Scoot Transfers: Mod assist;+2 physical assistance;With slide board General transfer comment: Performed sit to stand transitions with cues for  technique and anterior weight shift with +2 Max assist for boost and balance. Pt with decreased tolerance today, only standing for approx 5 seconds each attempt before becoming too fatigued and needing to sit. Practiced lateral scoots due to unsafe for stand pivot transfer training today. Focused on unloading buttocks to reduce sheer forces with scoot. Pt had bowel incontinence half way across and needed to return to bed for hygiene care.  Ambulation/Gait                 Stairs            Wheelchair Mobility    Modified Rankin (Stroke Patients Only)       Balance                                    Cognition Arousal/Alertness: Awake/alert Behavior During Therapy: WFL for tasks assessed/performed Overall Cognitive Status: Within Functional Limits for tasks assessed                      Exercises      General Comments        Pertinent Vitals/Pain Pain Assessment: No/denies pain    Home Living                      Prior Function            PT Goals (current goals can now be found in the care plan section) Acute Rehab PT Goals PT Goal Formulation: With patient Time For Goal Achievement: 06/28/15 Potential to Achieve Goals: Good Progress towards PT  goals: Progressing toward goals    Frequency  Min 3X/week    PT Plan Current plan remains appropriate    Co-evaluation             End of Session Equipment Utilized During Treatment: Gait belt Activity Tolerance: Patient tolerated treatment well Patient left: with call bell/phone within reach;in bed;with nursing/sitter in room     Time: 1726-1748 PT Time Calculation (min) (ACUTE ONLY): 22 min  Charges:  $Therapeutic Activity: 8-22 mins                    G Codes:      Ellouise Newer 20-Jun-2015, 6:40 PM  Camille Bal Rivereno, [place]

## 2015-06-19 NOTE — Progress Notes (Signed)
[facility] Progress Note  Assessment/Plan: 1. Left BKA -wife appealing insurance company's denial; He cannot go to a local SNF because of being on peritoneal dialysis. There are no local SNFs that provide this. Receiving minimal PT; basically just sits in bed all the time 2. ESRD with hypokalemia  - CAPD - on all 1.5s - with K supplement; discussed that it may be difficult to get in all exchanges while doing rehab. He does not want CCPD b/c it interferes with his sleep due to the machine noise. K 2.7 today lower even with extra K yesterday; discussed with nursing and pt about increasing K in diet - will give additional PO KCl and keep at 40 tid  3. Anemia - Hgb 9.3 stable last tsat 44% on Aranesp 100 sq per Sat - if it continues to trend down will increase on Sat 4. Secondary hyperparathyroidism - Ps in 5s  5. HTN/volume - BP 120smidodrine 10 tid ; wts trending down 6. Nutrition - alb 1.5 on carb mod diet + K supplement - added prostat, and vitamin - encouraged protein intake BUN is low considering all the supplements he is supposed to be getting; discussed with pt and wife the need to ^ protein intake - advised she can bring him some other ^ pro foods from home if that will improve intake; changed to heart healthy carb mod - to increase K options; changed supplement to ensure which is higher in K than nepro or resource. 7. Post op fever- resolved -no cultures done - WBC a little lower 8. DM - BS in 200s - per primary - 9. Disp - snf pending  Jack Jacobson, PA-C [place] [phone] 06/19/2015,10:14 AM  LOS: 6 days   Pt seen, examined and agree w A/P as above. Insurance refusing to cover CIR and SNF won't accept PD patients. PD going well. Doesn't do well on HD per wife.  Jack Splinter MD [facility] pager [phone]    cell [phone] 06/19/2015, 12:34 PM    Subjective:   Wife says two options - NH in [place] does PD and  another in [place] will let her do CAPD- waiting on insurance issues. She is adamant he cannot do HD  Objective Filed Vitals:   06/18/15 1720 06/18/15 2100 06/19/15 0513 06/19/15 0954  BP: 110/50 125/52 126/54 129/50  Pulse: 82 71 77 91  Temp: 98.2 F (36.8 C) 97.8 F (36.6 C) 98.8 F (37.1 C) 98.6 F (37 C)  TempSrc: Oral Oral Oral Oral  Resp: 20 20  20   Height:      Weight:  98.8 kg (217 lb 13 oz)    SpO2: 100% 100% 96% 98%   Physical Exam General: NAD sitting in bed Heart: RRR Lungs: grossly clear Abdomen: distended with fluid in belly soft Extremities: left BKA wrapped - right tr LE edema Dialysis Access:  PD cath  Dialysis Orders: 5 exchanges all 1.5s  Additional Objective Labs: Basic Metabolic Panel:  Recent Labs Lab 06/15/15 0433 06/16/15 1519 06/18/15 0512 06/19/15 0638  NA 132* 131* 132* 132*  K 3.9 3.9 3.2* 2.7*  CL 94* 94* 93* 95*  CO2 27 26 26 24   GLUCOSE 186* 237* 213* 182*  BUN 31* 32* 37* 45*  CREATININE 4.41* 5.07* 5.34* 5.48*  CALCIUM 8.1* 8.4* 8.1* 7.9*  PHOS 5.3* 5.3* 5.1*  --    Liver Function Tests:  Recent Labs Lab 06/13/15 1205 06/15/15 0433 06/16/15 1519 06/18/15 0512  AST  31  --   --   --   ALT 19  --   --   --   ALKPHOS 129*  --   --   --   BILITOT 0.5  --   --   --   PROT 6.0*  --   --   --   ALBUMIN 2.0* 1.8* 1.6* 1.5*   No results for input(s): LIPASE, AMYLASE in the last 168 hours. CBC:  Recent Labs Lab 06/13/15 1205  06/15/15 0433 06/16/15 1519 06/18/15 0512  WBC 6.9  --  8.4 11.4* 10.2  HGB 10.2*  < > 9.3* 9.6* 9.3*  HCT 32.3*  < > 30.0* 30.8* 28.9*  MCV 98.8  --  99.3 100.0 99.7  PLT 463*  --  365 365 388  < > = values in this interval not displayed. Blood Culture    Component Value Date/Time   SDES URINE, CLEAN CATCH 05/20/2015 2213   SPECREQUEST NONE 05/20/2015 2213   CULT 50,000 COLONIES/mL YEAST 05/20/2015 2213   REPTSTATUS 05/22/2015 FINAL 05/20/2015 2213    Cardiac Enzymes: No results for  input(s): CKTOTAL, CKMB, CKMBINDEX, TROPONINI in the last 168 hours. CBG:  Recent Labs Lab 06/18/15 0741 06/18/15 1241 06/18/15 1657 06/18/15 2123 06/19/15 0803  GLUCAP 171* 168* 153* 186* 172*   Iron Studies: No results for input(s): IRON, TIBC, TRANSFERRIN, FERRITIN in the last 72 hours. @lablastinr3 @ Studies/Results: No results found. Medications:   . allopurinol  100 mg Oral BID  . aspirin  325 mg Oral Daily  . calcium carbonate  2 tablet Oral TID WC  . darbepoetin (ARANESP) injection - NON-DIALYSIS  100 mcg Subcutaneous Q Sat-1800  . dialysis solution 1.5% low-MG/low-CA   Intraperitoneal 5 X Daily  . docusate sodium  100 mg Oral BID  . feeding supplement  1 Container Oral TID BM  . feeding supplement (NEPRO CARB STEADY)  237 mL Oral BID BM  . feeding supplement (PRO-STAT SUGAR FREE 64)  30 mL Oral BID  . insulin aspart  0-9 Units Subcutaneous TID WC  . insulin aspart  3 Units Subcutaneous TID WC  . insulin glargine  30 Units Subcutaneous QHS  . levothyroxine  112 mcg Oral QAC breakfast  . metoCLOPramide  10 mg Oral TID AC  . midodrine  10 mg Oral TID AC  . multivitamin  1 tablet Oral QHS  . pantoprazole  40 mg Oral Daily  . potassium chloride  40 mEq Oral BID

## 2015-06-19 NOTE — Care Management Note (Signed)
Case Management Note  Patient Details  Name: Jack Chung MRN: [id number] Date of Birth: 27-Nov-1940  Subjective/Objective:      CM following for progression and d/c planning.              Action/Plan: This pt insurance has denied admission to CIR for rehab. They recommend SNF with pt changing to hemodialysis.  06/19/2015 Ongoing efforts by CSW, V Crawford to place this pt in a SNF with pt wife providing peritoneal dialysis. All local SNFs have declined this pt based on need for PD, even with the wife providing the facilities are untrained to provide support or assistance.  Per the wife request Ms Sharlet Salina contacted [facility], which does provide peritoneal dialysis however they contract only with [facility] and are untrained at this time with [facility], therefore are unable to accept this pt. This CM and CSW, V Sharlet Salina met with the pt wife and explained this situation. Pt wife, very upset stating that the pt would " go home and die", as she physically is unable to provide physical support to the pt other than perform the peritoneal dialysis and the [facility] 3xper week would not be adequate. After [name] explained this to the pt , he has agreed to accept hemodialysis short term in order to be accepted at a SNF for rehab.  Jerrye Beavers, NP with renal notified and will contact [name] so that plan may be made for a tunneled cath and hemodialysis initiated.   Expected Discharge Date:    06/24/2015              Expected Discharge Plan:  Skilled Nursing Facility  In-House Referral:  NA, Clinical Social Work  Discharge planning Services  CM Consult  Post Acute Care Choice:  NA Choice offered to:  NA  DME Arranged:  N/A DME Agency:  NA  HH Arranged:  NA HH Agency:  NA  Status of Service:  In process, will continue to follow  Medicare Important Message Given:  Yes Date Medicare IM Given:    Medicare IM give by:    Date Additional Medicare IM Given:    Additional Medicare Important  Message give by:     If discussed at [place] of Stay Meetings, dates discussed:    Additional Comments:  Adron Bene, RN 06/19/2015, 3:23 PM

## 2015-06-20 DIAGNOSIS — L8990 Pressure ulcer of unspecified site, unspecified stage: Secondary | ICD-10-CM | POA: Diagnosis present

## 2015-06-20 LAB — GLUCOSE, CAPILLARY
Glucose-Capillary: 107 mg/dL — ABNORMAL HIGH (ref 65–99)
Glucose-Capillary: 186 mg/dL — ABNORMAL HIGH (ref 65–99)
Glucose-Capillary: 111 mg/dL — ABNORMAL HIGH (ref 65–99)

## 2015-06-20 MED ORDER — ALTEPLASE 2 MG IJ SOLR: 2.0000 mg | Freq: Once | INTRAMUSCULAR | Status: DC | PRN

## 2015-06-20 MED ORDER — LIDOCAINE-PRILOCAINE 2.5-2.5 % EX CREA: 1.0000 "application " | TOPICAL_CREAM | CUTANEOUS | Status: DC | PRN

## 2015-06-20 MED ORDER — LIDOCAINE HCL (PF) 1 % IJ SOLN: 5.0000 mL | INTRAMUSCULAR | Status: DC | PRN

## 2015-06-20 MED ORDER — MIDODRINE HCL 5 MG PO TABS
ORAL_TABLET | ORAL | Status: AC
  Administered 2015-06-20: 10 mg
  Filled 2015-06-20: qty 2

## 2015-06-20 MED ORDER — LIDOCAINE-PRILOCAINE 2.5-2.5 % EX CREA
1.0000 "application " | TOPICAL_CREAM | CUTANEOUS | Status: DC | PRN
  Filled 2015-06-20: qty 5

## 2015-06-20 MED ORDER — PENTAFLUOROPROP-TETRAFLUOROETH EX AERO: 1.0000 "application " | INHALATION_SPRAY | CUTANEOUS | Status: DC | PRN

## 2015-06-20 MED ORDER — SODIUM CHLORIDE 0.9 % IV SOLN: 100.0000 mL | INTRAVENOUS | Status: DC | PRN

## 2015-06-20 MED ORDER — LOPERAMIDE HCL 2 MG PO CAPS
2.0000 mg | ORAL_CAPSULE | Freq: Four times a day (QID) | ORAL | Status: DC | PRN
  Administered 2015-06-20: 2 mg via ORAL
  Filled 2015-06-20: qty 1

## 2015-06-20 MED ORDER — HEPARIN SODIUM (PORCINE) 1000 UNIT/ML DIALYSIS: 1000.0000 [IU] | INTRAMUSCULAR | Status: DC | PRN

## 2015-06-20 MED ORDER — OXYCODONE-ACETAMINOPHEN 5-325 MG PO TABS: 1.0000 | ORAL_TABLET | ORAL | Status: DC | PRN

## 2015-06-20 MED ORDER — ALTEPLASE 2 MG IJ SOLR
2.0000 mg | Freq: Once | INTRAMUSCULAR | Status: DC | PRN
  Filled 2015-06-20: qty 2

## 2015-06-20 MED ORDER — HEPARIN SODIUM (PORCINE) 1000 UNIT/ML DIALYSIS: 20.0000 [IU]/kg | INTRAMUSCULAR | Status: DC | PRN

## 2015-06-20 NOTE — Progress Notes (Addendum)
Late Entry 06/20/2015 1230p Called [name] to make aware that patient has a stage 2 on his sacrum and needed a WOC consult. [name] to enter order.  Myrakle Wingler, Bryn Gulling

## 2015-06-20 NOTE — Clinical Social Work Note (Addendum)
Patient will discharge to [facility] on Saturday, 1/14. Authorization received from [place] with [employer] on today: Auth [id number], Effective for 3 days beginning 1/14. Next review-1/16 and RUG level-RVC. Patient's wife Traelyn Gentleman) competed admissions paperwork at [facility] this evening.    Patient transitioned to hemodialysis for his time in rehab and has been set-up at [facility], 1 pm chair time. His first treatment will be 1/17 and he will report to center at 11 am.    Blakelynn Scheeler Givens, MSW, [place] Clinical Social Worker [place] [phone]

## 2015-06-20 NOTE — Progress Notes (Signed)
[facility] Progress Note  Assessment/Plan: 1. Left BKA- plans for d/c to NH 2. ESRD with hypokalemia - transitioning to hemodialysis for the short term.  First use of AVF successful today at 250. Initiating CLIP process today- hopefully can dialyze at [facility] short term. Schedule not known yet.  Might have to be TTS; using 4 K bath labs not drawn this am but were ordered- check Sat.  Plan HD again Sat am 3 hr on new outpt schedu   3. Anemia - Hgb 9.3 stable last tsat 44% on Aranesp 100 sq per Sat - if it continues to trend down will increase on Sat- labs ordered but now drawn - recheck tomorow 4. Secondary hyperparathyroidism - Ps in 5s  5. HTN/volume - BP 110s smidodrine 10 tid ; wts trending down- make estimated EDW for d/c - based on bed weights 6. Nutrition - alb 1.5 - expect hemodialysis will help with low alb due to high protein loss with PD -  7. Post op fever- resolved -no cultures done - 8. DM - BS in 150 - 200s - per primary -may respond differently on HD 9. Disp - snf pending-   [name], PA-C [facility] [phone] 06/20/2015,7:59 AM  LOS: 7 days   Pt seen, examined and agree w A/P as above. Fortunately AVF worked for HD today. HD again tomorrow and CLIP in process.  [name] MD [facility] pager [phone]    cell [phone] 06/20/2015, 12:34 PM    Subjective:   No c/o  Objective Filed Vitals:   06/19/15 0954 06/19/15 1800 06/19/15 2100 06/20/15 0500  BP: 129/50 113/55 118/58 114/71  Pulse: 91 83 78 74  Temp: 98.6 F (37 C) 98 F (36.7 C) 98.6 F (37 C) 98.7 F (37.1 C)  TempSrc: Oral Oral Oral Oral  Resp: 20 18 18 18   Height:      Weight:   99.2 kg (218 lb 11.1 oz)   SpO2: 98% 98% 97% 100%   Physical Exam goal 1.5 General: NAD on on HD Heart: RRR Lungs: grossly clear Abdomen: soft NT Extremities: left BKA wrapped - no right LE Dialysis Access: PD cath  Dialysis Orders: PD  Additional  Objective Labs: Basic Metabolic Panel:  Recent Labs Lab 06/15/15 0433 06/16/15 1519 06/18/15 0512 06/19/15 0638  NA 132* 131* 132* 132*  K 3.9 3.9 3.2* 2.7*  CL 94* 94* 93* 95*  CO2 27 26 26 24   GLUCOSE 186* 237* 213* 182*  BUN 31* 32* 37* 45*  CREATININE 4.41* 5.07* 5.34* 5.48*  CALCIUM 8.1* 8.4* 8.1* 7.9*  PHOS 5.3* 5.3* 5.1*  --    Liver Function Tests:  Recent Labs Lab 06/13/15 1205 06/15/15 0433 06/16/15 1519 06/18/15 0512  AST 31  --   --   --   ALT 19  --   --   --   ALKPHOS 129*  --   --   --   BILITOT 0.5  --   --   --   PROT 6.0*  --   --   --   ALBUMIN 2.0* 1.8* 1.6* 1.5*   CBC:  Recent Labs Lab 06/13/15 1205  06/15/15 0433 06/16/15 1519 06/18/15 0512  WBC 6.9  --  8.4 11.4* 10.2  HGB 10.2*  < > 9.3* 9.6* 9.3*  HCT 32.3*  < > 30.0* 30.8* 28.9*  MCV 98.8  --  99.3 100.0 99.7  PLT 463*  --  365 365  388  < > = values in this interval not displayed. Blood Culture    Component Value Date/Time   SDES URINE, CLEAN CATCH 05/20/2015 2213   SPECREQUEST NONE 05/20/2015 2213   CULT 50,000 COLONIES/mL YEAST 05/20/2015 2213   REPTSTATUS 05/22/2015 FINAL 05/20/2015 2213   CBG:  Recent Labs Lab 06/18/15 2123 06/19/15 0803 06/19/15 1157 06/19/15 1656 06/19/15 2214  GLUCAP 186* 172* 152* 207* 182*   Medications:   . allopurinol  100 mg Oral BID  . aspirin  325 mg Oral Daily  . calcium carbonate  2 tablet Oral TID WC  . darbepoetin (ARANESP) injection - NON-DIALYSIS  100 mcg Subcutaneous Q Sat-1800  . dialysis solution 1.5% low-MG/low-CA   Intraperitoneal 5 X Daily  . docusate sodium  100 mg Oral BID  . feeding supplement (ENSURE ENLIVE)  237 mL Oral BID BM  . feeding supplement (PRO-STAT SUGAR FREE 64)  30 mL Oral BID  . insulin aspart  0-9 Units Subcutaneous TID WC  . insulin aspart  3 Units Subcutaneous TID WC  . insulin glargine  30 Units Subcutaneous QHS  . levothyroxine  112 mcg Oral QAC breakfast  . metoCLOPramide  10 mg Oral TID AC  .  midodrine  10 mg Oral TID AC  . multivitamin  1 tablet Oral QHS  . pantoprazole  40 mg Oral Daily  . potassium chloride  40 mEq Oral TID

## 2015-06-20 NOTE — Discharge Summary (Signed)
Physician Discharge Summary  Patient ID: Jack Chung MRN: [id number] DOB/AGE: 73-11-1940 73 y.o.  Admit date: 06/13/2015 Discharge date: 06/20/2015  Admission Diagnoses: Gangrene left foot  Discharge Diagnoses:  Active Problems:   Below knee amputation status ([place])   Type 2 diabetes mellitus with polyneuropathy (HCC)   End-stage renal disease on hemodialysis ([place])   Pressure ulcer   Discharged Condition: stable  Hospital Course: Patient's hospital course essentially unremarkable. Patient underwent a transtibial amputation. Postoperatively patient was on peritoneal dialysis. Patient required discharge to either inpatient or outpatient rehabilitation. Insurance would not cover patient for inpatient rehabilitation. The outpatient facilities would not accept the patient on peritoneal dialysis. Patient then elected to proceed with hemodialysis and patient was then scheduled for discharge to skilled nursing with hemodialysis.  Consults: nephrology  Significant Diagnostic Studies: labs: Tina labs  Treatments: dialysis: Hemodialysis and surgery: Patient underwent transtibial imitation the also underwent peritoneal dialysis as well as hemodialysis.  Discharge Exam: Blood pressure 134/63, pulse 84, temperature 98 F (36.7 C), temperature source Oral, resp. rate 20, height 5\' 9"  (1.753 m), weight 97.4 kg (214 lb 11.7 oz), SpO2 99 %. Incision/Wound: dressing clean and dry  Disposition: 06-Home-Health Care Svc     Medication List    ASK your doctor about these medications        acidophilus Caps capsule  Take 2 capsules by mouth daily.     allopurinol 100 MG tablet  Commonly known as:  ZYLOPRIM  Take 100 mg by mouth daily.     aspirin 325 MG tablet  Take 325 mg by mouth daily.     calcium carbonate 500 MG chewable tablet  Commonly known as:  TUMS - dosed in mg elemental calcium  Chew 2 tablets by mouth 3 (three) times daily with meals.     cetirizine 10 MG tablet  Commonly  known as:  ZYRTEC  Take 10 mg by mouth as needed.     feeding supplement Liqd  Take 1 Container by mouth 3 (three) times daily between meals.     gentamicin cream 0.1 %  Commonly known as:  GARAMYCIN  Apply 1 application topically at bedtime.     insulin aspart 100 UNIT/ML injection  Commonly known as:  novoLOG  Inject 10-15 Units into the skin 3 (three) times daily with meals. Take none if blood sugar < 150, 151-200 2 units, 201-250 4 units, 251-300 6 units, 301-350 8 units     Insulin Glargine 100 UNIT/ML Solostar Pen  Commonly known as:  LANTUS  Inject 45 units into the skin daily. Dx 250.00     lactulose 10 GM/15ML solution  Commonly known as:  CHRONULAC  Take 10 g by mouth every 4 (four) hours as needed for mild constipation.     levothyroxine 112 MCG tablet  Commonly known as:  SYNTHROID, LEVOTHROID  LABS/APPOINTMENT OVERDUE, 1 by mouth daily in the morning 30 minutes before breakfast for Thyroid     loperamide 2 MG capsule  Commonly known as:  IMODIUM  Take 2 mg by mouth daily as needed for diarrhea or loose stools.     metoCLOPramide 5 MG tablet  Commonly known as:  REGLAN  Take 2 tablets (10 mg total) by mouth 3 (three) times daily before meals.     midodrine 10 MG tablet  Commonly known as:  PROAMATINE  Take 1 tablet (10 mg total) by mouth 3 (three) times daily.     MULTI VITAMIN DAILY PO  Take 1 tablet  by mouth daily.     omeprazole 10 MG capsule  Commonly known as:  PRILOSEC  Take 10 mg by mouth daily.     oxyCODONE-acetaminophen 5-325 MG tablet  Commonly known as:  PERCOCET/ROXICET  Take 1-2 tablets by mouth every 6 (six) hours as needed for moderate pain.     PERITONEAL DIALYSIS SOLUTIONS IP  Inject 3,000 Units into the peritoneum 5 (five) times daily.     potassium chloride SA 20 MEQ tablet  Commonly known as:  K-DUR,KLOR-CON  Take 20 mEq by mouth daily.           Follow-up Information    Follow up with DUDA,MARCUS V, MD In 2 weeks.    Specialty:  Orthopedic Surgery   Contact information:   [place] [address] [phone]       Signed: Newt Minion 06/20/2015, 4:33 PM

## 2015-06-20 NOTE — Progress Notes (Signed)
Patient ID: Jack Chung, male   DOB: [birth year], 75 y.o.   MRN: [id number] Patient is status post transtibial amputation. He was initially on peritoneal dialysis. Insurance would not cover patient to go to inpatient rehabilitation and the rehabilitation facilities would not accept the patient on peritoneal dialysis. Patient has elected to proceed with hemodialysis. Patient has undergone 2 hemodialysis treatments. Patient may be discharged to skilled nursing at this time. Discharge orders and discharge summary are completed.

## 2015-06-20 NOTE — Consult Note (Signed)
.  WOC wound consult note Reason for Consult: Consult requested to assess buttocks.  Pt c/o pain to the site for several days.  He went to the OR on 1/6 for orthopedic surgery. Wound type: Bilat buttocks are red with partial thickness skin loss; appearance consistent with moisture associated skin damage.  He is frequently incontinent of liquid stool; cleaned skin and changed his bed, then he was incontinent again immediately afterwards. Affected area of MASD is 10X10cm.  Left buttock has dark purple deep tissue injury; 8X3cm in patchy area. Pressure Ulcer POA: No Dressing procedure/placement/frequency: Deep tissue injury is high risk to evolve into full thickness tissue loss despite optimal care.  Air mattress replacement ordered to reduce pressure, encouraged pt to reposition off the affected side as often as possible; barrier cream to repel moisture from frequent watery stools, pressure redistribution pillow in room for use when OOB. Discussed plan of care with patient and wife at the bedside. Please re-consult if further assistance is needed.  Thank-you,  Julien Girt MSN, [place], [place], [place], [place]

## 2015-06-20 NOTE — Progress Notes (Signed)
Occupational Therapy Treatment Patient Details Name: Jack Chung MRN: [id number] DOB: 05-Mar-1941 Today's Date: 06/20/2015    History of present illness Patient is a 75 year old gentleman with diabetic insensate neuropathy end-stage renal disease on dialysis, seen s/p Lt BKA. Hx of CHF, blind left eye, HTN, anemia, CAD, and sarcoidosis.   OT comments  Focus on bed mobility, BUE strengthening and sitting balance in preparation for ADL. Excellent participation. Recommend SNF for D/C. Will continue to follow acutely.   Follow Up Recommendations  SNF;Supervision/Assistance - 24 hour    Equipment Recommendations  Other (comment) (TBA at SNF)    Recommendations for Other Services      Precautions / Restrictions Precautions Precautions: Fall Precaution Comments: L BKA Restrictions Weight Bearing Restrictions: Yes LLE Weight Bearing: Non weight bearing       Mobility Bed Mobility Overal bed mobility: Needs Assistance Bed Mobility: Supine to Sit;Sit to Supine Rolling: Mod assist     Sit to supine: Min assist   General bed mobility comments: Improving withbed mobility  Transfers          not addressed today            Balance     Sitting balance-Leahy Scale: Fair                             ADL                                                Vision                     Perception     Praxis      Cognition   Behavior During Therapy: WFL for tasks assessed/performed Overall Cognitive Status: Within Functional Limits for tasks assessed                       Extremity/Trunk Assessment               Exercises Other Exercises Other Exercises: core strengthening using PNF diagonals in sitting x 20 reps Other Exercises:  BUE theraband strengthening ex with level 1 theraband 25 reps Other Exercises: facilitation of forward weight shift and scooting to R. Able to lift buttocks off bed with mod A.    Shoulder Instructions       General Comments      Pertinent Vitals/ Pain       Pain Assessment: No/denies pain  Home Living                                          Prior Functioning/Environment              Frequency Min 2X/week     Progress Toward Goals  OT Goals(current goals can now be found in the care plan section)  Progress towards OT goals: Progressing toward goals  Acute Rehab OT Goals Patient Stated Goal: to get better OT Goal Formulation: With patient Time For Goal Achievement: 06/30/15 Potential to Achieve Goals: Good ADL Goals Pt Will Perform Grooming: with set-up;sitting Pt Will Perform Upper Body Bathing: with set-up;sitting Pt Will Transfer to Toilet: with min assist;with +2 assist;with transfer board;bedside commode  Additional ADL Goal #1: Pt will complete BIL UE HEP at mod I level  Plan Discharge plan needs to be updated;Frequency needs to be updated    Co-evaluation                 End of Session     Activity Tolerance Patient tolerated treatment well   Patient Left with call bell/phone within reach;in bed;with bed alarm set   Nurse Communication Mobility status        Time: [id number] OT Time Calculation (min): 25 min  Charges: OT General Charges $OT Visit: 1 Procedure OT Treatments $Therapeutic Activity: 8-22 mins $Therapeutic Exercise: 8-22 mins  Jahara Dail,HILLARY 06/20/2015, 5:44 PM   [facility], OTR/L  [phone] 06/20/2015

## 2015-06-20 NOTE — Progress Notes (Signed)
   06/20/15 1452  Clinical Encounter Type  Visited With Patient and family together  Visit Type Spiritual support  Referral From Care management  Spiritual Encounters  Spiritual Needs Emotional  Stress Factors  Patient Stress Factors Financial concerns;Health changes;Loss of control;Loss;Major life changes  Family Stress Factors Family relationships;Loss;Loss of control;Major life changes  Chpl allowed patient's wife to vent for about 10 minutes, then left while SW discussed next steps.

## 2015-06-20 NOTE — Progress Notes (Signed)
06/20/2015 1:45PM  (Late Entry)   Patient and family member reported to Nurse Tech that he has been having loose stools since Monday. Patient wife also stated that she has been reporting his wound on his bottom since Monday as well. Patient wife did not understand that the Nurse Dawn was a Best boy and kept referring to her as the MD. I educated both patient and family member about [place] Nurse and her role.   Informed MD [name] about patient loose stools and request for further evaluation. [name] deferred further evaluation at this time. Patient was still placed on enteric precautions per consult of ID.   When air mattress arrived I informed patient and wife that air mattress was available for patient to help with wound on his bottom. Since both patient and family were informed of possible discharge today they preferred at this time to not place patient on air mattress due to not wanting to be charged.   Wife voiced concerns saying. " he was ordered on SCDs since Monday and no one has put them on him and we are being charged about $127 a day for equipment that is still wrapped in plastic in the chair. [employer] is ruining our lives and I have already called [employer], Doctor, hospital and I will be calling the [employer]. All of his insurance cost have gone up and this is not fair to [place]"   Education officer, museum and [place] at bedside to witness these statements. Wife and patient became very teary eyed over "be forced" to switch from PD to HD because the insurance company said it "was cheaper" for them.   Will continue to assess and monitor the patient.   [employer], RN-BC, [employer] [facility] Phone [phone]

## 2015-06-20 NOTE — Clinical Social Work Placement (Signed)
   CLINICAL SOCIAL WORK PLACEMENT  NOTE   Date:  06/20/2015  Patient Details  Name: Jack Chung MRN: [id number] Date of Birth: 1940-09-25  Clinical Social Work is seeking post-discharge placement for this patient at the [place] level of care (*CSW will initial, date and re-position this form in  chart as items are completed):  Yes   Patient/family provided with [place] Work Department's list of facilities offering this level of care within the geographic area requested by the patient (or if unable, by the patient's family).  Yes   Patient/family informed of their freedom to choose among providers that offer the needed level of care, that participate in Medicare, Medicaid or managed care program needed by the patient, have an available bed and are willing to accept the patient.  Yes   Patient/family informed of 's ownership interest in [facility] and [facility], as well as of the fact that they are under no obligation to receive care at these facilities.  PASRR submitted to EDS on 06/18/15     PASRR number received on 06/18/15     Existing PASRR number confirmed on       FL2 transmitted to all facilities in geographic area requested by pt/family on 06/18/15     FL2 transmitted to all facilities within larger geographic area on       Patient informed that his/her managed care company has contracts with or will negotiate with certain facilities, including the following:        Yes   Patient/family informed of bed offers received.  Patient chooses bed at [facility] and patient chooses bed at      Patient to be transferred to [facility] on  Anticipated d/c date is Saturday, 06/21/15.  Patient to be transferred to facility by Ambulance [facility]y [facility])    Patient family notified on  06/20/15 of transfer.  Name of family member notified:   Wife, Jack Chung     PHYSICIAN    Additional Comment:    _______________________________________________ Sable Feil, LCSW 06/20/2015, 5:03 PM

## 2015-06-21 LAB — CBC
HCT: 28 % — ABNORMAL LOW (ref 39.0–52.0)
Hemoglobin: 8.7 g/dL — ABNORMAL LOW (ref 13.0–17.0)
MCH: 31 pg (ref 26.0–34.0)
MCHC: 31.1 g/dL (ref 30.0–36.0)
MCV: 99.6 fL (ref 78.0–100.0)
Platelets: 352 10*3/uL (ref 150–400)
RBC: 2.81 MIL/uL — ABNORMAL LOW (ref 4.22–5.81)
RDW: 13.5 % (ref 11.5–15.5)
WBC: 10.4 10*3/uL (ref 4.0–10.5)

## 2015-06-21 LAB — RENAL FUNCTION PANEL
Albumin: 1.5 g/dL — ABNORMAL LOW (ref 3.5–5.0)
Anion gap: 11 (ref 5–15)
BUN: 34 mg/dL — ABNORMAL HIGH (ref 6–20)
CO2: 23 mmol/L (ref 22–32)
Calcium: 7.7 mg/dL — ABNORMAL LOW (ref 8.9–10.3)
Chloride: 101 mmol/L (ref 101–111)
Creatinine, Ser: 4.21 mg/dL — ABNORMAL HIGH (ref 0.61–1.24)
GFR calc Af Amer: 15 mL/min — ABNORMAL LOW (ref >60)
GFR calc non Af Amer: 13 mL/min — ABNORMAL LOW (ref >60)
Glucose, Bld: 81 mg/dL (ref 65–99)
Phosphorus: 2.9 mg/dL (ref 2.5–4.6)
Potassium: 3.4 mmol/L — ABNORMAL LOW (ref 3.5–5.1)
Sodium: 135 mmol/L (ref 135–145)

## 2015-06-21 LAB — GLUCOSE, CAPILLARY
GLUCOSE-CAPILLARY: 51 mg/dL — AB (ref 65–99)
GLUCOSE-CAPILLARY: 62 mg/dL — AB (ref 65–99)
Glucose-Capillary: 189 mg/dL — ABNORMAL HIGH (ref 65–99)

## 2015-06-21 LAB — HEPATITIS B SURFACE ANTIGEN: HEP B S AG: NEGATIVE

## 2015-06-21 LAB — HEPATITIS B SURFACE ANTIBODY,QUALITATIVE: HEP B S AB: NONREACTIVE

## 2015-06-21 LAB — HEPATITIS B CORE ANTIBODY, TOTAL: HEP B C TOTAL AB: NEGATIVE

## 2015-06-21 MED ORDER — DARBEPOETIN ALFA 100 MCG/0.5ML IJ SOSY
PREFILLED_SYRINGE | INTRAMUSCULAR | Status: AC
  Administered 2015-06-21: 100 ug via SUBCUTANEOUS
  Filled 2015-06-21: qty 0.5

## 2015-06-21 MED ORDER — INSULIN GLARGINE 100 UNIT/ML SOLOSTAR PEN: PEN_INJECTOR | SUBCUTANEOUS | Status: DC

## 2015-06-21 NOTE — Progress Notes (Signed)
This note is an ADDENDUM to discharge summary. BS low this am.  Due to the change in dialysis modality, he is not getting the glucose load he was getting with PD and BS are lower.  Discussed with Dr. Jonnie Finner.  Plan lower Lantus to 20 Units with SSI coverage and titrate up as needed.  His discharge summary states 45 Units, but he only had been getting 30 units here and his BS was still low this am.  Amalia Hailey, PA-C

## 2015-06-21 NOTE — Progress Notes (Signed)
ADDENDUM to Discharge Summary  Pt is no longer receiving Peritoneal Dialysis.  He will be going to hemodialysis at the [facility] on TTS schedule for the duration of his nursing stay.  Please evaluate whether he can transfer independently to a wheel chair.  If not, please transport to dialysis on Tuesday with a hoyer lift pad.    Please disregard the order on the d/c summary to inject 3000 units of PD solution into peritoneum.   However, please DO apply gentamicin cream to PD cath exit site daily.  Amalia Hailey, PA-C [facility]

## 2015-06-21 NOTE — Progress Notes (Signed)
Subjective: Pt in hemodialysis   Objective: Vital signs in last 24 hours: Temp:  [98 F (36.7 C)-99.9 F (37.7 C)] 98.1 F (36.7 C) (01/14 0824) Pulse Rate:  [73-84] 74 (01/14 1000) Resp:  [18-20] 20 (01/14 0824) BP: (93-134)/(41-66) 118/53 mmHg (01/14 1000) SpO2:  [96 %-99 %] 96 % (01/14 0824) Weight:  [94 kg (207 lb 3.7 oz)-97.4 kg (214 lb 11.7 oz)] 95.4 kg (210 lb 5.1 oz) (01/14 0824)  Intake/Output from previous day: 01/13 0701 - 01/14 0700 In: 600 [P.O.:600] Out: 1000  Intake/Output this shift:    Exam:  Dressing intact  Labs:  Recent Labs  06/21/15 0836  HGB 8.7*    Recent Labs  06/21/15 0836  WBC 10.4  RBC 2.81*  HCT 28.0*  PLT 352    Recent Labs  06/19/15 0638 06/21/15 0838  NA 132* 135  K 2.7* 3.4*  CL 95* 101  CO2 24 23  BUN 45* 34*  CREATININE 5.48* 4.21*  GLUCOSE 182* 81  CALCIUM 7.9* 7.7*   No results for input(s): LABPT, INR in the last 72 hours.  Assessment/Plan: Plan dc to snf - insulin dose adjusted on dc summary   Taysha Majewski SCOTT 06/21/2015, 10:32 AM

## 2015-06-21 NOTE — Progress Notes (Signed)
CBG=51. Patient feels "groggy." Breakfast in room, patient eating. Gave juice in addition to tray. Will recheck CBG 15 minutes after eating.

## 2015-06-21 NOTE — Progress Notes (Signed)
Pt transferred to: [facility] Anticipated date of transfer: 06/21/15 Transported by: Ambulance ([facility]) Time Tentatively Scheduled for: 3:34 PM Family notified: Wife- Aydden Ulery Report #:  [phone] Nursing notified to call report.  Patient is aware of d/c.    CSW signing off. Lorie Phenix. [place], [place] [phone]  (weekend coverage)

## 2015-06-21 NOTE — Progress Notes (Signed)
[facility] Progress Note  Assessment/Plan: 1. Left BKA- plans for d/c to NH for rehab today- transitioning to HD during rehab period TTS [facility] 2. ESRD with hypokalemia -  Still getting K supplement while on HD and K is low at 3.4 - has not been on a renal diet. Plan use 4 K bath at discharge, continue po K supplement and check weekly Ks  At dialysis.  Make EDW = post HD today.  Volume status improved overall. AVF doing well at Qb 250 - titrate up.  Once at full BFR, 4 K bath may be more effective at raising K Will ask Home Training to see him for weekly flushes while on HD  3. Anemia - Hgb 9.3 down to 8.,7 stable last tsat 44% on Aranesp 100 sq per Sat - will increase dose for d/c; needs Fe studies 4. Secondary hyperparathyroidism - Ps in 5s  5. HTN/volume - BP 90 - 100 but less facial edema on midodrine 10 tid ; wts trending down- make estimated EDW for d/c - based on bed weights 6. Nutrition - alb 1.5 - expect hemodialysis will help with low alb due to high protein loss with PD - will give ONSP at outpt HD unit 7. Post op fever- resolved -no cultures done - 8. DM - BS low today- per primary PROBABLY needs lower Lantus due to being on HD- plan decrease to 20 - there is not the glucose load that he was getting on CAPD throughout the day while doing HD; he will have q HD accuchecks at his HD unit 9. Disp - plan d/c to [facility] today 10.  Buttocks - skin status - see wound care consult- needs high protein diet: high risk for breakdown.  He needs to mobilize and change positions.  [name], PA-C [place] 06/21/2015,9:38 AM  LOS: 8 days   Pt seen, examined and agree w A/P as above.  [name] MD [facility] pager [phone]    cell [phone] 06/21/2015, 1:27 PM    Subjective:   No c/o  Objective Filed Vitals:   06/21/15 0845 06/21/15 0900 06/21/15 0918 06/21/15 0930  BP: 93/42 98/47 104/44 107/56   Pulse:  76 73 73  Temp:      TempSrc:      Resp:      Height:      Weight:      SpO2:       Physical Exam General: NAD face less puffy more wrinkles Heart: RRR Lungs: clear anteriorly Abdomen: obese soft Extremities: left BKA wrapped; right heel cushion Dialysis Access:  PD cath and right upper AVF Qb 250  Additional Objective Labs: Basic Metabolic Panel:  Recent Labs Lab 06/16/15 1519 06/18/15 0512 06/19/15 0638 06/21/15 0838  NA 131* 132* 132* 135  K 3.9 3.2* 2.7* 3.4*  CL 94* 93* 95* 101  CO2 26 26 24 23   GLUCOSE 237* 213* 182* 81  BUN 32* 37* 45* 34*  CREATININE 5.07* 5.34* 5.48* 4.21*  CALCIUM 8.4* 8.1* 7.9* 7.7*  PHOS 5.3* 5.1*  --  2.9   Liver Function Tests:  Recent Labs Lab 06/16/15 1519 06/18/15 0512 06/21/15 0838  ALBUMIN 1.6* 1.5* 1.5*   CBC:  Recent Labs Lab 06/15/15 0433 06/16/15 1519 06/18/15 0512 06/21/15 0836  WBC 8.4 11.4* 10.2 10.4  HGB 9.3* 9.6* 9.3* 8.7*  HCT 30.0* 30.8* 28.9* 28.0*  MCV 99.3 100.0 99.7 99.6  PLT 365 365 388 352  CBG:  Recent Labs Lab 06/19/15 2214 06/20/15 1258 06/20/15 1702 06/20/15 2058 06/21/15 0757  GLUCAP 182* 107* 186* 111* 51*   Medications:   . allopurinol  100 mg Oral BID  . aspirin  325 mg Oral Daily  . calcium carbonate  2 tablet Oral TID WC  . darbepoetin (ARANESP) injection - NON-DIALYSIS  100 mcg Subcutaneous Q Sat-1800  . dialysis solution 1.5% low-MG/low-CA   Intraperitoneal 5 X Daily  . docusate sodium  100 mg Oral BID  . feeding supplement (ENSURE ENLIVE)  237 mL Oral BID BM  . feeding supplement (PRO-STAT SUGAR FREE 64)  30 mL Oral BID  . insulin aspart  0-9 Units Subcutaneous TID WC  . insulin aspart  3 Units Subcutaneous TID WC  . insulin glargine  30 Units Subcutaneous QHS  . levothyroxine  112 mcg Oral QAC breakfast  . metoCLOPramide  10 mg Oral TID AC  . midodrine  10 mg Oral TID AC  . multivitamin  1 tablet Oral QHS  . pantoprazole  40 mg Oral Daily  . potassium  chloride  40 mEq Oral TID

## 2015-06-23 ENCOUNTER — Encounter: Payer: Self-pay | Admitting: Internal Medicine

## 2015-06-23 ENCOUNTER — Non-Acute Institutional Stay (SKILLED_NURSING_FACILITY): Payer: Medicare Other | Admitting: Internal Medicine

## 2015-06-23 ENCOUNTER — Other Ambulatory Visit: Payer: Self-pay | Admitting: *Deleted

## 2015-06-23 DIAGNOSIS — E1121 Type 2 diabetes mellitus with diabetic nephropathy: Secondary | ICD-10-CM

## 2015-06-23 DIAGNOSIS — D869 Sarcoidosis, unspecified: Secondary | ICD-10-CM

## 2015-06-23 DIAGNOSIS — Z794 Long term (current) use of insulin: Secondary | ICD-10-CM | POA: Diagnosis not present

## 2015-06-23 DIAGNOSIS — I2510 Atherosclerotic heart disease of native coronary artery without angina pectoris: Secondary | ICD-10-CM | POA: Diagnosis not present

## 2015-06-23 DIAGNOSIS — N186 End stage renal disease: Secondary | ICD-10-CM

## 2015-06-23 DIAGNOSIS — Z89512 Acquired absence of left leg below knee: Secondary | ICD-10-CM | POA: Diagnosis not present

## 2015-06-23 DIAGNOSIS — I5032 Chronic diastolic (congestive) heart failure: Secondary | ICD-10-CM | POA: Diagnosis not present

## 2015-06-23 DIAGNOSIS — Z992 Dependence on renal dialysis: Secondary | ICD-10-CM | POA: Diagnosis not present

## 2015-06-23 DIAGNOSIS — I10 Essential (primary) hypertension: Secondary | ICD-10-CM

## 2015-06-23 DIAGNOSIS — I2583 Coronary atherosclerosis due to lipid rich plaque: Secondary | ICD-10-CM

## 2015-06-23 MED ORDER — OXYCODONE-ACETAMINOPHEN 5-325 MG PO TABS: ORAL_TABLET | ORAL | Status: DC

## 2015-06-23 NOTE — Progress Notes (Signed)
Patient ID: Jack Chung, male   DOB: July 24, 1940, 75 y.o.   MRN: [id number]    DATE: 06/23/15  Location:  [facility]    Place of Service: SNF (31)   Extended Emergency Contact Information Primary Emergency Contact: Chung,Jack Address: [address] [place], [place] [address] Jack Chung of [place] Phone: [phone] Mobile Phone: [phone] Relation: Spouse Secondary Emergency Contact: Jack Chung Mobile Phone: [phone] Relation: Son  Stage manager Complaint  Patient presents with  . New Admit To SNF    HPI:  75 yo male seen today as a new admission into SNF following hospital stay for left diabetic foot osteomyelitis. 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   He has no c/o today. He denies pain and has not required pain med. He is scheduled to see Dr Jack Chung later this week. Tolerating PT/OT  He is a PD pt but because he is at SNF, he will going to HD on TThSa with access of right AVF.   He is on insulin for DM. No low BS reactions. a1c 8.7% in Nov 2016. Gastroparesis from DM stable on omeprazole and reglan. For constipation, he takes lactulose.   He has orthostatic hypotension and takes midodrine TID.  Thyroid stable on levothyroxine. TSH 5.075 last month.   CHF stable with dialysis. He has CAD and has underwent CABG in the past. No CP at this time. He takes potassium supplement  Sarcoidosis is asymptomatic  Past Medical History  Diagnosis Date  . Hypertension   . Hypothyroidism   . CHF (congestive heart failure) ([facility]ung)   . Anemia   . Blind left eye   . Sleep apnea     not on CPAP  . Coronary artery disease     CABG x 11 Jun 2009.  MRSA infections of incsions  . Arthritis   . Sarcoidosis ([place])     Hx:of  . Diabetic retinopathy ([place])     Hx: of bilateral  . Edema   . Hyperlipidemia   . 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 Atherosclerosis of native arteries of the extremities, unspecified   . Eczema     Hx: of  . Insomnia   . Seasonal allergies   . History of blood transfusion     with heart surgery  . Coronary atherosclerosis of native coronary artery   . Atherosclerosis of native arteries of the extremities, unspecified   . Pneumonia   . Diabetes mellitus     type 2  . ESRD on dialysis [facility])     peritoneal dialysis - 5 times a time 9a, 12n, 3p, 6p, 9p  . GERD (gastroesophageal reflux disease)   . Neuropathy ([place])   . Cancer ([place])     skin cancer - melanoma on back    Past Surgical History  Procedure Laterality Date  . Cardiac surgery      total of 6 surgeries, 5 related to mrsa  . Coronary artery bypass graft  06/2009  . Cataract surgery      Hx: of  . Debridements      Hx: of secondary to MRSA  . Av fistula placement Right 10/11/2012    Procedure: ARTERIOVENOUS (AV) FISTULA CREATION;  Surgeon: Jack Posner, MD;  Location: [facility];  Service: Vascular;  Laterality: Right;  .  Insertion of dialysis catheter Left   . Capd insertion N/A 11/28/2012    Procedure: LAPAROSCOPIC PERITONEAL DIALYSIS CATHETER PLACEMENT;  Surgeon: Jack Ok, MD;  Location: [facility];  Service: General;  Laterality: N/A;  . Left heart catheterization with coronary/graft angiogram N/A 01/31/2014    Procedure: LEFT HEART CATHETERIZATION WITH Jack Chung;  Surgeon: Jack Sine, MD;  Location: [facility] CATH LAB;  Service: Cardiovascular;  Laterality: N/A;  . Peripheral vascular catheterization N/A 04/28/2015    Procedure: Abdominal Aortogram w/Lower Extremity;  Surgeon: Jack Cherryvale, MD;  Location: [place] CV LAB;  Service: Cardiovascular;  Laterality: N/A;  . Amputation Left 04/30/2015    Procedure: 1st Ray Amputation Left Foot;  Surgeon: Jack Minion, MD;  Location: [place];  Service: Orthopedics;  Laterality: Left;  . Esophagogastroduodenoscopy N/A 05/20/2015    Procedure:  ESOPHAGOGASTRODUODENOSCOPY (EGD);  Surgeon: Jack Corner, MD;  Location: [facility] ENDOSCOPY;  Service: Endoscopy;  Laterality: N/A;  Possible esophageal dilatation  . Eye surgery Bilateral     cataract surgery with lens implants  . Amputation Left 06/13/2015    Procedure: Left Below Knee Amputation;  Surgeon: Jack Minion, MD;  Location: [place];  Service: Orthopedics;  Laterality: Left;    Patient Care Team: Jack Neer, MD as PCP - General (Family Medicine)  Social History   Social History  . Marital Status: Married    Spouse Name: N/A  . Number of Children: N/A  . Years of Education: N/A   Occupational History  . Not on file.   Social History Main Topics  . Smoking status: Never Smoker   . Smokeless tobacco: Never Used  . Alcohol Use: No  . Drug Use: No  . Sexual Activity: Not on file   Other Topics Concern  . Not on file   Social History Narrative   From [place] here a yr ago   Has 49 kids-eldest son in town     reports that he has never smoked. He has never used smokeless tobacco. He reports that he does not drink alcohol or use illicit drugs.  Immunization History  Administered Date(s) Administered  . Pneumococcal-Unspecified 05/02/2009    Allergies  Allergen Reactions  . Flu Virus Vaccine     Gets the flu, patient has bad reaction to the flu vaccine.  . Lipitor [Atorvastatin]     Leg cramps    Medications: Patient's Medications  New Prescriptions   No medications on file  Previous Medications   CALCIUM CARBONATE (TUMS - DOSED IN MG ELEMENTAL CALCIUM) 500 MG CHEWABLE TABLET    Chew 2 tablets by mouth 3 (three) times daily with meals.    CETIRIZINE (ZYRTEC) 10 MG TABLET    Take 10 mg by mouth as needed.    FEEDING SUPPLEMENT (BOOST / RESOURCE BREEZE) LIQD    Take 1 Container by mouth 3 (three) times daily between meals.   INSULIN ASPART (NOVOLOG) 100 UNIT/ML INJECTION    Inject 10 Units into the skin 3 (three) times daily with meals. Take  none if blood sugar < 150, 151-200 2 units, 201-250 4 units, 251-300 6 units, 301-350 8 units   INSULIN GLARGINE (LANTUS) 100 UNIT/ML SOLOSTAR PEN    Inject 20 units into the skin daily. Dx 250.00   LACTULOSE (CHRONULAC) 10 GM/15ML SOLUTION    Take 10 g by mouth every 4 (four) hours as needed for mild constipation.   LEVOTHYROXINE (SYNTHROID, LEVOTHROID) 112 MCG TABLET  LABS/APPOINTMENT OVERDUE, 1 by mouth daily in the morning 30 minutes before breakfast for Thyroid   LOPERAMIDE (IMODIUM) 2 MG CAPSULE    Take 2 mg by mouth daily as needed for diarrhea or loose stools.   METOCLOPRAMIDE (REGLAN) 5 MG TABLET    Take 2 tablets (10 mg total) by mouth 3 (three) times daily before meals.   MIDODRINE (PROAMATINE) 10 MG TABLET    Take 1 tablet (10 mg total) by mouth 3 (three) times daily.   MULTIPLE VITAMIN (MULTI VITAMIN DAILY PO)    Take 1 tablet by mouth daily.   OMEPRAZOLE (PRILOSEC) 10 MG CAPSULE    Take 10 mg by mouth daily.   POTASSIUM CHLORIDE SA (K-DUR,KLOR-CON) 20 MEQ TABLET    Take 20 mEq by mouth daily.  Modified Medications   Modified Medication Previous Medication   OXYCODONE-ACETAMINOPHEN (PERCOCET/ROXICET) 5-325 MG TABLET oxyCODONE-acetaminophen (PERCOCET/ROXICET) 5-325 MG tablet      Take one tablet by mouth every 6 hours as needed for moderate pain    Take 1-2 tablets by mouth every 6 (six) hours as needed for moderate pain.  Discontinued Medications   ACIDOPHILUS (RISAQUAD) CAPS CAPSULE    Take 2 capsules by mouth daily.   ALLOPURINOL (ZYLOPRIM) 100 MG TABLET    Take 100 mg by mouth daily.   ASPIRIN 325 MG TABLET    Take 325 mg by mouth daily.   GENTAMICIN CREAM (GARAMYCIN) 0.1 %    Apply 1 application topically at bedtime.   OXYCODONE-ACETAMINOPHEN (ROXICET) 5-325 MG TABLET    Take 1 tablet by mouth every 4 (four) hours as needed for severe pain.   PERITONEAL DIALYSIS SOLUTIONS IP    Inject 3,000 Units into the peritoneum 5 (five) times daily.     Review of Systems    Constitutional: Positive for fatigue.  Gastrointestinal: Positive for constipation.  Musculoskeletal: Positive for back pain and arthralgias.  All other systems reviewed and are negative.   Filed Vitals:   06/23/15 0915  BP: 122/63  Pulse: 85  Temp: 96.9 F (36.1 C)  TempSrc: Oral   There is no weight on file to calculate BMI.  Physical Exam  Constitutional: He is oriented to person, place, and time. He appears well-developed and well-nourished.  Sitting in w/c in NAD.  HENT:  Mouth/Throat: Oropharynx is clear and moist.  Eyes: Pupils are equal, round, and reactive to light. No scleral icterus.  Neck: Neck supple. Carotid bruit is not present.  Cardiovascular: Normal rate, regular rhythm and intact distal pulses.  Exam reveals no gallop and no friction rub.   Murmur (1/6 SEM) heard. Right LE with palpable DP/PT. Right armAVF with palpable thrill and audible bruit. No RLE edema. No calf TTP  Pulmonary/Chest: Effort normal and breath sounds normal. He has no wheezes. He has no rales. He exhibits no tenderness.  Abdominal: Soft. Bowel sounds are normal. He exhibits no distension, no abdominal bruit, no pulsatile midline mass and no mass. There is no tenderness. There is no rebound and no guarding.  PD cath intact  Musculoskeletal: He exhibits edema.  Left BKA dressing c/d/i  Lymphadenopathy:    He has no cervical adenopathy.  Neurological: He is alert and oriented to person, place, and time.  Skin: Skin is warm and dry. No rash noted.  Psychiatric: He has a normal mood and affect. His behavior is normal. Judgment and thought content normal.     Labs reviewed: Admission on 06/13/2015, Discharged on 06/21/2015  Component Date Value Ref  Range Status  . aPTT 06/13/2015 35  24 - 37 seconds Final  . WBC 06/13/2015 6.9  4.0 - 10.5 K/uL Final  . RBC 06/13/2015 3.27* 4.22 - 5.81 MIL/uL Final  . Hemoglobin 06/13/2015 10.2* 13.0 - 17.0 g/dL Final  . HCT 06/13/2015 32.3* 39.0 - 52.0  % Final  . MCV 06/13/2015 98.8  78.0 - 100.0 fL Final  . MCH 06/13/2015 31.2  26.0 - 34.0 pg Final  . MCHC 06/13/2015 31.6  30.0 - 36.0 g/dL Final  . RDW 06/13/2015 13.6  11.5 - 15.5 % Final  . Platelets 06/13/2015 463* 150 - 400 K/uL Final  . Sodium 06/13/2015 138  135 - 145 mmol/L Final  . Potassium 06/13/2015 3.4* 3.5 - 5.1 mmol/L Final  . Chloride 06/13/2015 99* 101 - 111 mmol/L Final  . CO2 06/13/2015 27  22 - 32 mmol/L Final  . Glucose, Bld 06/13/2015 89  65 - 99 mg/dL Final  . BUN 06/13/2015 40* 6 - 20 mg/dL Final  . Creatinine, Ser 06/13/2015 4.69* 0.61 - 1.24 mg/dL Final  . Calcium 06/13/2015 8.5* 8.9 - 10.3 mg/dL Final  . Total Protein 06/13/2015 6.0* 6.5 - 8.1 g/dL Final  . Albumin 06/13/2015 2.0* 3.5 - 5.0 g/dL Final  . AST 06/13/2015 31  15 - 41 U/L Final  . ALT 06/13/2015 19  17 - 63 U/L Final  . Alkaline Phosphatase 06/13/2015 129* 38 - 126 U/L Final  . Total Bilirubin 06/13/2015 0.5  0.3 - 1.2 mg/dL Final  . GFR calc non Af Amer 06/13/2015 11* >60 mL/min Final  . GFR calc Af Amer 06/13/2015 13* >60 mL/min Final   Comment: (NOTE) The eGFR has been calculated using the CKD EPI equation. This calculation has not been validated in all clinical situations. eGFR's persistently <60 mL/min signify possible Chronic Kidney Disease.   . Anion gap 06/13/2015 12  5 - 15 Final  . Prothrombin Time 06/13/2015 14.2  11.6 - 15.2 seconds Final  . INR 06/13/2015 1.08  0.00 - 1.49 Final  . Glucose-Capillary 06/13/2015 109* 65 - 99 mg/dL Final  . Glucose-Capillary 06/13/2015 66  65 - 99 mg/dL Final  . Comment 1 06/13/2015 Notify RN   Final  . Glucose-Capillary 06/13/2015 107* 65 - 99 mg/dL Final  . Comment 1 06/13/2015 Notify RN   Final  . Glucose-Capillary 06/13/2015 144* 65 - 99 mg/dL Final  . Glucose-Capillary 06/13/2015 274* 65 - 99 mg/dL Final  . Glucose-Capillary 06/14/2015 221* 65 - 99 mg/dL Final  . Phosphorus 06/14/2015 5.1* 2.5 - 4.6 mg/dL Final  . Glucose-Capillary  06/14/2015 217* 65 - 99 mg/dL Final  . Glucose-Capillary 06/14/2015 239* 65 - 99 mg/dL Final  . WBC 06/15/2015 8.4  4.0 - 10.5 K/uL Final  . RBC 06/15/2015 3.02* 4.22 - 5.81 MIL/uL Final  . Hemoglobin 06/15/2015 9.3* 13.0 - 17.0 g/dL Final  . HCT 06/15/2015 30.0* 39.0 - 52.0 % Final  . MCV 06/15/2015 99.3  78.0 - 100.0 fL Final  . MCH 06/15/2015 30.8  26.0 - 34.0 pg Final  . MCHC 06/15/2015 31.0  30.0 - 36.0 g/dL Final  . RDW 06/15/2015 13.8  11.5 - 15.5 % Final  . Platelets 06/15/2015 365  150 - 400 K/uL Final  . Sodium 06/15/2015 132* 135 - 145 mmol/L Final  . Potassium 06/15/2015 3.9  3.5 - 5.1 mmol/L Final  . Chloride 06/15/2015 94* 101 - 111 mmol/L Final  . CO2 06/15/2015 27  22 - 32 mmol/L Final  .  Glucose, Bld 06/15/2015 186* 65 - 99 mg/dL Final  . BUN 06/15/2015 31* 6 - 20 mg/dL Final  . Creatinine, Ser 06/15/2015 4.41* 0.61 - 1.24 mg/dL Final  . Calcium 06/15/2015 8.1* 8.9 - 10.3 mg/dL Final  . Phosphorus 06/15/2015 5.3* 2.5 - 4.6 mg/dL Final  . Albumin 06/15/2015 1.8* 3.5 - 5.0 g/dL Final  . GFR calc non Af Amer 06/15/2015 12* >60 mL/min Final  . GFR calc Af Amer 06/15/2015 14* >60 mL/min Final   Comment: (NOTE) The eGFR has been calculated using the CKD EPI equation. This calculation has not been validated in all clinical situations. eGFR's persistently <60 mL/min signify possible Chronic Kidney Disease.   . Anion gap 06/15/2015 11  5 - 15 Final  . Glucose-Capillary 06/14/2015 227* 65 - 99 mg/dL Final  . Glucose-Capillary 06/15/2015 151* 65 - 99 mg/dL Final  . Glucose-Capillary 06/15/2015 163* 65 - 99 mg/dL Final  . Glucose-Capillary 06/15/2015 208* 65 - 99 mg/dL Final  . Glucose-Capillary 06/15/2015 180* 65 - 99 mg/dL Final  . Sodium 06/13/2015 137  135 - 145 mmol/L Final  . Potassium 06/13/2015 3.3* 3.5 - 5.1 mmol/L Final  . Glucose, Bld 06/13/2015 90  65 - 99 mg/dL Final  . HCT 06/13/2015 32.0* 39.0 - 52.0 % Final  . Hemoglobin 06/13/2015 10.9* 13.0 - 17.0 g/dL  Final  . Glucose-Capillary 06/16/2015 162* 65 - 99 mg/dL Final  . WBC 06/16/2015 11.4* 4.0 - 10.5 K/uL Final  . RBC 06/16/2015 3.08* 4.22 - 5.81 MIL/uL Final  . Hemoglobin 06/16/2015 9.6* 13.0 - 17.0 g/dL Final  . HCT 06/16/2015 30.8* 39.0 - 52.0 % Final  . MCV 06/16/2015 100.0  78.0 - 100.0 fL Final  . MCH 06/16/2015 31.2  26.0 - 34.0 pg Final  . MCHC 06/16/2015 31.2  30.0 - 36.0 g/dL Final  . RDW 06/16/2015 13.7  11.5 - 15.5 % Final  . Platelets 06/16/2015 365  150 - 400 K/uL Final  . Sodium 06/16/2015 131* 135 - 145 mmol/L Final  . Potassium 06/16/2015 3.9  3.5 - 5.1 mmol/L Final  . Chloride 06/16/2015 94* 101 - 111 mmol/L Final  . CO2 06/16/2015 26  22 - 32 mmol/L Final  . Glucose, Bld 06/16/2015 237* 65 - 99 mg/dL Final  . BUN 06/16/2015 32* 6 - 20 mg/dL Final  . Creatinine, Ser 06/16/2015 5.07* 0.61 - 1.24 mg/dL Final  . Calcium 06/16/2015 8.4* 8.9 - 10.3 mg/dL Final  . Phosphorus 06/16/2015 5.3* 2.5 - 4.6 mg/dL Final  . Albumin 06/16/2015 1.6* 3.5 - 5.0 g/dL Final  . GFR calc non Af Amer 06/16/2015 10* >60 mL/min Final  . GFR calc Af Amer 06/16/2015 12* >60 mL/min Final   Comment: (NOTE) The eGFR has been calculated using the CKD EPI equation. This calculation has not been validated in all clinical situations. eGFR's persistently <60 mL/min signify possible Chronic Kidney Disease.   . Anion gap 06/16/2015 11  5 - 15 Final  . Glucose-Capillary 06/16/2015 182* 65 - 99 mg/dL Final  . Glucose-Capillary 06/16/2015 237* 65 - 99 mg/dL Final  . Glucose-Capillary 06/16/2015 261* 65 - 99 mg/dL Final  . Glucose-Capillary 06/17/2015 254* 65 - 99 mg/dL Final  . Glucose-Capillary 06/17/2015 239* 65 - 99 mg/dL Final  . WBC 06/18/2015 10.2  4.0 - 10.5 K/uL Final  . RBC 06/18/2015 2.90* 4.22 - 5.81 MIL/uL Final  . Hemoglobin 06/18/2015 9.3* 13.0 - 17.0 g/dL Final  . HCT 06/18/2015 28.9* 39.0 - 52.0 % Final  .  MCV 06/18/2015 99.7  78.0 - 100.0 fL Final  . MCH 06/18/2015 32.1  26.0 - 34.0  pg Final  . MCHC 06/18/2015 32.2  30.0 - 36.0 g/dL Final  . RDW 06/18/2015 13.6  11.5 - 15.5 % Final  . Platelets 06/18/2015 388  150 - 400 K/uL Final  . Sodium 06/18/2015 132* 135 - 145 mmol/L Final  . Potassium 06/18/2015 3.2* 3.5 - 5.1 mmol/L Final  . Chloride 06/18/2015 93* 101 - 111 mmol/L Final  . CO2 06/18/2015 26  22 - 32 mmol/L Final  . Glucose, Bld 06/18/2015 213* 65 - 99 mg/dL Final  . BUN 06/18/2015 37* 6 - 20 mg/dL Final  . Creatinine, Ser 06/18/2015 5.34* 0.61 - 1.24 mg/dL Final  . Calcium 06/18/2015 8.1* 8.9 - 10.3 mg/dL Final  . Phosphorus 06/18/2015 5.1* 2.5 - 4.6 mg/dL Final  . Albumin 06/18/2015 1.5* 3.5 - 5.0 g/dL Final  . GFR calc non Af Amer 06/18/2015 10* >60 mL/min Final  . GFR calc Af Amer 06/18/2015 11* >60 mL/min Final   Comment: (NOTE) The eGFR has been calculated using the CKD EPI equation. This calculation has not been validated in all clinical situations. eGFR's persistently <60 mL/min signify possible Chronic Kidney Disease.   . Anion gap 06/18/2015 13  5 - 15 Final  . Glucose-Capillary 06/17/2015 191* 65 - 99 mg/dL Final  . Glucose-Capillary 06/17/2015 150* 65 - 99 mg/dL Final  . Glucose-Capillary 06/18/2015 171* 65 - 99 mg/dL Final  . Glucose-Capillary 06/18/2015 168* 65 - 99 mg/dL Final  . Glucose-Capillary 06/18/2015 153* 65 - 99 mg/dL Final  . Sodium 06/19/2015 132* 135 - 145 mmol/L Final  . Potassium 06/19/2015 2.7* 3.5 - 5.1 mmol/L Final   Comment: CRITICAL RESULT CALLED TO, READ BACK BY AND VERIFIED WITH: C ECKELMAN,RN AT 0881 ON 01.12.17 BY W JOHNSON   . Chloride 06/19/2015 95* 101 - 111 mmol/L Final  . CO2 06/19/2015 24  22 - 32 mmol/L Final  . Glucose, Bld 06/19/2015 182* 65 - 99 mg/dL Final  . BUN 06/19/2015 45* 6 - 20 mg/dL Final  . Creatinine, Ser 06/19/2015 5.48* 0.61 - 1.24 mg/dL Final  . Calcium 06/19/2015 7.9* 8.9 - 10.3 mg/dL Final  . GFR calc non Af Amer 06/19/2015 9* >60 mL/min Final  . GFR calc Af Amer 06/19/2015 11* >60  mL/min Final   Comment: (NOTE) The eGFR has been calculated using the CKD EPI equation. This calculation has not been validated in all clinical situations. eGFR's persistently <60 mL/min signify possible Chronic Kidney Disease.   . Anion gap 06/19/2015 13  5 - 15 Final  . Glucose-Capillary 06/18/2015 186* 65 - 99 mg/dL Final  . Glucose-Capillary 06/19/2015 172* 65 - 99 mg/dL Final  . Glucose-Capillary 06/19/2015 152* 65 - 99 mg/dL Final  . Glucose-Capillary 06/19/2015 207* 65 - 99 mg/dL Final  . Glucose-Capillary 06/19/2015 182* 65 - 99 mg/dL Final  . Hep B Core Total Ab 06/20/2015 Negative  Negative Final   Comment: (NOTE) Performed At: [facility] [place], [place] [id number] Lindon Romp MD [id number]   . Hep B S Ab 06/20/2015 Non Reactive   Final   Comment: (NOTE)              Non Reactive: Inconsistent with immunity,                            less than 10 mIU/mL  Reactive:     Consistent with immunity,                            greater than 9.9 mIU/mL Performed At: [facility] [place], [place] [id number] Lindon Romp MD [id number]   . Hepatitis B Surface Ag 06/20/2015 Negative  Negative Final   Comment: (NOTE) Performed At: [facility] [place], [place] [id number] Lindon Romp MD KX:[id number]   . Glucose-Capillary 06/20/2015 107* 65 - 99 mg/dL Final  . Glucose-Capillary 06/20/2015 186* 65 - 99 mg/dL Final  . Glucose-Capillary 06/20/2015 111* 65 - 99 mg/dL Final  . Glucose-Capillary 06/21/2015 51* 65 - 99 mg/dL Final  . WBC 06/21/2015 10.4  4.0 - 10.5 K/uL Final  . RBC 06/21/2015 2.81* 4.22 - 5.81 MIL/uL Final  . Hemoglobin 06/21/2015 8.7* 13.0 - 17.0 g/dL Final  . HCT 06/21/2015 28.0* 39.0 - 52.0 % Final  . MCV 06/21/2015 99.6  78.0 - 100.0 fL Final  . MCH 06/21/2015 31.0  26.0 - 34.0 pg Final  . MCHC 06/21/2015 31.1  30.0 - 36.0 g/dL Final  . RDW 06/21/2015 13.5   11.5 - 15.5 % Final  . Platelets 06/21/2015 352  150 - 400 K/uL Final  . Sodium 06/21/2015 135  135 - 145 mmol/L Final  . Potassium 06/21/2015 3.4* 3.5 - 5.1 mmol/L Final  . Chloride 06/21/2015 101  101 - 111 mmol/L Final  . CO2 06/21/2015 23  22 - 32 mmol/L Final  . Glucose, Bld 06/21/2015 81  65 - 99 mg/dL Final  . BUN 06/21/2015 34* 6 - 20 mg/dL Final  . Creatinine, Ser 06/21/2015 4.21* 0.61 - 1.24 mg/dL Final  . Calcium 06/21/2015 7.7* 8.9 - 10.3 mg/dL Final  . Phosphorus 06/21/2015 2.9  2.5 - 4.6 mg/dL Final  . Albumin 06/21/2015 1.5* 3.5 - 5.0 g/dL Final  . GFR calc non Af Amer 06/21/2015 13* >60 mL/min Final  . GFR calc Af Amer 06/21/2015 15* >60 mL/min Final   Comment: (NOTE) The eGFR has been calculated using the CKD EPI equation. This calculation has not been validated in all clinical situations. eGFR's persistently <60 mL/min signify possible Chronic Kidney Disease.   . Anion gap 06/21/2015 11  5 - 15 Final  . Glucose-Capillary 06/21/2015 62* 65 - 99 mg/dL Final  . Glucose-Capillary 06/21/2015 189* 65 - 99 mg/dL Final  Admission on 05/18/2015, Discharged on 05/21/2015  Component Date Value Ref Range Status  . Lactic Acid, Venous 05/18/2015 5.58* 0.5 - 2.0 mmol/L Final  . Comment 05/18/2015 NOTIFIED PHYSICIAN   Final  . Sodium 05/18/2015 130* 135 - 145 mmol/L Final  . Potassium 05/18/2015 4.4  3.5 - 5.1 mmol/L Final  . Chloride 05/18/2015 91* 101 - 111 mmol/L Final  . CO2 05/18/2015 23  22 - 32 mmol/L Final  . Glucose, Bld 05/18/2015 303* 65 - 99 mg/dL Final  . BUN 05/18/2015 38* 6 - 20 mg/dL Final  . Creatinine, Ser 05/18/2015 6.59* 0.61 - 1.24 mg/dL Final  . Calcium 05/18/2015 9.5  8.9 - 10.3 mg/dL Final  . Total Protein 05/18/2015 7.4  6.5 - 8.1 g/dL Final  . Albumin 05/18/2015 2.6* 3.5 - 5.0 g/dL Final  . AST 05/18/2015 38  15 - 41 U/L Final  . ALT 05/18/2015 18  17 - 63 U/L Final  . Alkaline Phosphatase 05/18/2015 155* 38 - 126 U/L Final  . Total Bilirubin  05/18/2015 0.5  0.3 - 1.2 mg/dL Final  . GFR calc non Af Amer 05/18/2015 7* >60 mL/min Final  . GFR calc Af Amer 05/18/2015 9* >60 mL/min Final   Comment: (NOTE) The eGFR has been calculated using the CKD EPI equation. This calculation has not been validated in all clinical situations. eGFR's persistently <60 mL/min signify possible Chronic Kidney Disease.   . Anion gap 05/18/2015 16* 5 - 15 Final  . WBC 05/18/2015 9.7  4.0 - 10.5 K/uL Final  . RBC 05/18/2015 4.37  4.22 - 5.81 MIL/uL Final  . Hemoglobin 05/18/2015 14.3  13.0 - 17.0 g/dL Final  . HCT 05/18/2015 44.3  39.0 - 52.0 % Final  . MCV 05/18/2015 101.4* 78.0 - 100.0 fL Final  . MCH 05/18/2015 32.7  26.0 - 34.0 pg Final  . MCHC 05/18/2015 32.3  30.0 - 36.0 g/dL Final  . RDW 05/18/2015 14.8  11.5 - 15.5 % Final  . Platelets 05/18/2015 365  150 - 400 K/uL Final  . Neutrophils Relative % 05/18/2015 75   Final  . Neutro Abs 05/18/2015 7.3  1.7 - 7.7 K/uL Final  . Lymphocytes Relative 05/18/2015 16   Final  . Lymphs Abs 05/18/2015 1.6  0.7 - 4.0 K/uL Final  . Monocytes Relative 05/18/2015 7   Final  . Monocytes Absolute 05/18/2015 0.7  0.1 - 1.0 K/uL Final  . Eosinophils Relative 05/18/2015 1   Final  . Eosinophils Absolute 05/18/2015 0.1  0.0 - 0.7 K/uL Final  . Basophils Relative 05/18/2015 1   Final  . Basophils Absolute 05/18/2015 0.1  0.0 - 0.1 K/uL Final  . Specimen Description 05/18/2015 BLOOD LEFT HAND   Final  . Special Requests 05/18/2015 PEDIATRICS 4CC   Final  . Culture 05/18/2015 NO GROWTH 5 DAYS   Final  . Report Status 05/18/2015 05/23/2015 FINAL   Final  . Specimen Description 05/18/2015 BLOOD LEFT ANTECUBITAL   Final  . Special Requests 05/18/2015 BOTTLES DRAWN AEROBIC AND ANAEROBIC 5CC   Final  . Culture 05/18/2015 NO GROWTH 5 DAYS   Final  . Report Status 05/18/2015 05/23/2015 FINAL   Final  . Color, Urine 05/20/2015 RED* YELLOW Final   BIOCHEMICALS MAY BE AFFECTED BY COLOR  . APPearance 05/20/2015 CLOUDY*  CLEAR Final  . Specific Gravity, Urine 05/20/2015 1.022  1.005 - 1.030 Final  . pH 05/20/2015 5.0  5.0 - 8.0 Final  . Glucose, UA 05/20/2015 250* NEGATIVE mg/dL Final  . Hgb urine dipstick 05/20/2015 MODERATE* NEGATIVE Final  . Bilirubin Urine 05/20/2015 MODERATE* NEGATIVE Final  . Ketones, ur 05/20/2015 15* NEGATIVE mg/dL Final  . Protein, ur 05/20/2015 30* NEGATIVE mg/dL Final  . Nitrite 05/20/2015 NEGATIVE  NEGATIVE Final  . Leukocytes, UA 05/20/2015 MODERATE* NEGATIVE Final  . Specimen Description 05/20/2015 URINE, CLEAN CATCH   Final  . Special Requests 05/20/2015 NONE   Final  . Culture 05/20/2015 50,000 COLONIES/mL YEAST   Final  . Report Status 05/20/2015 05/22/2015 FINAL   Final  . Troponin i, poc 05/18/2015 0.04  0.00 - 0.08 ng/mL Final  . Comment 3 05/18/2015          Final   Comment: Due to the release kinetics of cTnI, a negative result within the first hours of the onset of symptoms does not rule out myocardial infarction with certainty. If myocardial infarction is still suspected, repeat the test at appropriate intervals.   . B Natriuretic Peptide 05/18/2015 434.4* 0.0 - 100.0 pg/mL Final  . Glucose-Capillary 05/18/2015  238* 65 - 99 mg/dL Final  . Lactic Acid, Venous 05/18/2015 4.19* 0.5 - 2.0 mmol/L Final  . Comment 05/18/2015 NOTIFIED PHYSICIAN   Final  . Lactic Acid, Venous 05/18/2015 3.5* 0.5 - 2.0 mmol/L Final   Comment: CRITICAL RESULT CALLED TO, READ BACK BY AND VERIFIED WITH: VIVERITO,S RN 05/18/15 1656 WOOTEN,K   . Lactic Acid, Venous 05/18/2015 4.1* 0.5 - 2.0 mmol/L Final   Comment: CRITICAL RESULT CALLED TO, READ BACK BY AND VERIFIED WITH: WELLINGTON,N RN 05/18/15 1921 WOOTEN,K   . Hemoglobin 05/18/2015 13.8  13.0 - 17.0 g/dL Final  . HCT 05/18/2015 41.7  39.0 - 52.0 % Final  . WBC 05/19/2015 7.7  4.0 - 10.5 K/uL Final  . RBC 05/19/2015 3.45* 4.22 - 5.81 MIL/uL Final  . Hemoglobin 05/19/2015 11.1* 13.0 - 17.0 g/dL Final  . HCT 05/19/2015 34.9* 39.0 -  52.0 % Final  . MCV 05/19/2015 101.2* 78.0 - 100.0 fL Final  . MCH 05/19/2015 32.2  26.0 - 34.0 pg Final  . MCHC 05/19/2015 31.8  30.0 - 36.0 g/dL Final  . RDW 05/19/2015 15.3  11.5 - 15.5 % Final  . Platelets 05/19/2015 359  150 - 400 K/uL Final  . Sodium 05/19/2015 136  135 - 145 mmol/L Final  . Potassium 05/19/2015 4.3  3.5 - 5.1 mmol/L Final  . Chloride 05/19/2015 98* 101 - 111 mmol/L Final  . CO2 05/19/2015 28  22 - 32 mmol/L Final  . Glucose, Bld 05/19/2015 191* 65 - 99 mg/dL Final  . BUN 05/19/2015 36* 6 - 20 mg/dL Final  . Creatinine, Ser 05/19/2015 6.11* 0.61 - 1.24 mg/dL Final  . Calcium 05/19/2015 8.1* 8.9 - 10.3 mg/dL Final  . Total Protein 05/19/2015 5.4* 6.5 - 8.1 g/dL Final  . Albumin 05/19/2015 1.9* 3.5 - 5.0 g/dL Final  . AST 05/19/2015 24  15 - 41 U/L Final  . ALT 05/19/2015 14* 17 - 63 U/L Final  . Alkaline Phosphatase 05/19/2015 107  38 - 126 U/L Final  . Total Bilirubin 05/19/2015 0.7  0.3 - 1.2 mg/dL Final  . GFR calc non Af Amer 05/19/2015 8* >60 mL/min Final  . GFR calc Af Amer 05/19/2015 9* >60 mL/min Final   Comment: (NOTE) The eGFR has been calculated using the CKD EPI equation. This calculation has not been validated in all clinical situations. eGFR's persistently <60 mL/min signify possible Chronic Kidney Disease.   . Anion gap 05/19/2015 10  5 - 15 Final  . Glucose-Capillary 05/18/2015 276* 65 - 99 mg/dL Final  . Hemoglobin 05/19/2015 11.6* 13.0 - 17.0 g/dL Final  . HCT 05/19/2015 37.3* 39.0 - 52.0 % Final  . Glucose-Capillary 05/18/2015 188* 65 - 99 mg/dL Final  . Glucose-Capillary 05/19/2015 159* 65 - 99 mg/dL Final  . Glucose-Capillary 05/19/2015 140* 65 - 99 mg/dL Final  . WBC 05/20/2015 8.3  4.0 - 10.5 K/uL Final  . RBC 05/20/2015 3.45* 4.22 - 5.81 MIL/uL Final  . Hemoglobin 05/20/2015 11.0* 13.0 - 17.0 g/dL Final  . HCT 05/20/2015 35.2* 39.0 - 52.0 % Final  . MCV 05/20/2015 102.0* 78.0 - 100.0 fL Final  . MCH 05/20/2015 31.9  26.0 - 34.0 pg  Final  . MCHC 05/20/2015 31.3  30.0 - 36.0 g/dL Final  . RDW 05/20/2015 15.2  11.5 - 15.5 % Final  . Platelets 05/20/2015 317  150 - 400 K/uL Final  . Lactic Acid, Venous 05/20/2015 2.0  0.5 - 2.0 mmol/L Final  . Sodium 05/20/2015 133*  135 - 145 mmol/L Final  . Potassium 05/20/2015 3.3* 3.5 - 5.1 mmol/L Final   DELTA CHECK NOTED  . Chloride 05/20/2015 96* 101 - 111 mmol/L Final  . CO2 05/20/2015 26  22 - 32 mmol/L Final  . Glucose, Bld 05/20/2015 213* 65 - 99 mg/dL Final  . BUN 05/20/2015 33* 6 - 20 mg/dL Final  . Creatinine, Ser 05/20/2015 5.91* 0.61 - 1.24 mg/dL Final  . Calcium 05/20/2015 7.8* 8.9 - 10.3 mg/dL Final  . Phosphorus 05/20/2015 4.1  2.5 - 4.6 mg/dL Final  . Albumin 05/20/2015 1.8* 3.5 - 5.0 g/dL Final  . GFR calc non Af Amer 05/20/2015 9* >60 mL/min Final  . GFR calc Af Amer 05/20/2015 10* >60 mL/min Final   Comment: (NOTE) The eGFR has been calculated using the CKD EPI equation. This calculation has not been validated in all clinical situations. eGFR's persistently <60 mL/min signify possible Chronic Kidney Disease.   . Anion gap 05/20/2015 11  5 - 15 Final  . Glucose-Capillary 05/19/2015 244* 65 - 99 mg/dL Final  . Glucose-Capillary 05/19/2015 181* 65 - 99 mg/dL Final  . Magnesium 05/20/2015 1.5* 1.7 - 2.4 mg/dL Final  . TSH 05/20/2015 5.075* 0.350 - 4.500 uIU/mL Final  . Free T4 05/20/2015 0.86  0.61 - 1.12 ng/dL Final  . Glucose-Capillary 05/20/2015 201* 65 - 99 mg/dL Final  . Glucose-Capillary 05/20/2015 196* 65 - 99 mg/dL Final  . Glucose-Capillary 05/20/2015 139* 65 - 99 mg/dL Final  . Magnesium 05/20/2015 2.0  1.7 - 2.4 mg/dL Final  . Potassium 05/20/2015 4.2  3.5 - 5.1 mmol/L Final   DELTA CHECK NOTED  . WBC 05/21/2015 7.4  4.0 - 10.5 K/uL Final  . RBC 05/21/2015 3.35* 4.22 - 5.81 MIL/uL Final  . Hemoglobin 05/21/2015 10.5* 13.0 - 17.0 g/dL Final  . HCT 05/21/2015 34.1* 39.0 - 52.0 % Final  . MCV 05/21/2015 101.8* 78.0 - 100.0 fL Final  . MCH  05/21/2015 31.3  26.0 - 34.0 pg Final  . MCHC 05/21/2015 30.8  30.0 - 36.0 g/dL Final  . RDW 05/21/2015 14.9  11.5 - 15.5 % Final  . Platelets 05/21/2015 300  150 - 400 K/uL Final  . Sodium 05/21/2015 132* 135 - 145 mmol/L Final  . Potassium 05/21/2015 3.4* 3.5 - 5.1 mmol/L Final   DELTA CHECK NOTED  . Chloride 05/21/2015 96* 101 - 111 mmol/L Final  . CO2 05/21/2015 26  22 - 32 mmol/L Final  . Glucose, Bld 05/21/2015 297* 65 - 99 mg/dL Final  . BUN 05/21/2015 32* 6 - 20 mg/dL Final  . Creatinine, Ser 05/21/2015 5.83* 0.61 - 1.24 mg/dL Final  . Calcium 05/21/2015 7.6* 8.9 - 10.3 mg/dL Final  . GFR calc non Af Amer 05/21/2015 9* >60 mL/min Final  . GFR calc Af Amer 05/21/2015 10* >60 mL/min Final   Comment: (NOTE) The eGFR has been calculated using the CKD EPI equation. This calculation has not been validated in all clinical situations. eGFR's persistently <60 mL/min signify possible Chronic Kidney Disease.   . Anion gap 05/21/2015 10  5 - 15 Final  . Glucose-Capillary 05/20/2015 169* 65 - 99 mg/dL Final  . Glucose-Capillary 05/20/2015 389* 65 - 99 mg/dL Final  . Squamous Epithelial / LPF 05/20/2015 0-5* NONE SEEN Final  . WBC, UA 05/20/2015 6-30  0 - 5 WBC/hpf Final  . RBC / HPF 05/20/2015 0-5  0 - 5 RBC/hpf Final  . Bacteria, UA 05/20/2015 FEW* NONE SEEN Final  .  Urine-Other 05/20/2015 YEAST PRESENT   Final  . Glucose-Capillary 05/21/2015 200* 65 - 99 mg/dL Final  . Glucose-Capillary 05/21/2015 225* 65 - 99 mg/dL Final  Admission on 04/23/2015, Discharged on 05/02/2015  No results displayed because visit has over 200 results.      No results found.   Assessment/Plan   ICD-9-CM ICD-10-CM   1. Status post below knee amputation of left lower extremity ([place]) due to diabetic foot osteomyelitis V49.75 Z89.512   2. Type 2 diabetes mellitus with diabetic nephropathy, with long-term current use of insulin (HCC) 250.40 E11.21    583.81 Z79.4    V58.67    3. ESRD on dialysis [facility])  usually PD but currently HD TThSa due to SNF placement; via right arrn AVF 585.6 N18.6    V45.11 Z99.2   4. Chronic diastolic congestive heart failure (HCC) - stable with HD 428.32 I50.32    428.0    5. Coronary artery disease due to lipid rich plaque  S/p CABG 414.00 I25.10    414.3    6. Sarcoidosis ([place]) - asymptomatic 135 D86.9   7. Benign essential HTN - stable off meds; takes midodrine for orthostatic hypotension 401.1 I10     F/u with Ortho as scheduled  PT/OT as ordered  Cont current med as ordered  CBGs qAC and qHS  GOAL: short term rehab and d/c home when medically appropriate. Communicated with pt and nursing.  Will follow  Aynsley Fleet S. Perlie Gold  [facility] and Adult Medicine [address] [place], [place] [address] [phone] Cell (Monday-Friday 8 AM - 5 PM) [phone] After 5 PM and follow prompts

## 2015-06-23 NOTE — Telephone Encounter (Signed)
[facility] 

## 2015-06-26 ENCOUNTER — Encounter: Payer: Self-pay | Admitting: Internal Medicine

## 2015-07-04 ENCOUNTER — Non-Acute Institutional Stay (SKILLED_NURSING_FACILITY): Payer: Medicare Other | Admitting: Nurse Practitioner

## 2015-07-04 ENCOUNTER — Encounter: Payer: Self-pay | Admitting: Nurse Practitioner

## 2015-07-04 DIAGNOSIS — Z992 Dependence on renal dialysis: Secondary | ICD-10-CM | POA: Diagnosis not present

## 2015-07-04 DIAGNOSIS — I951 Orthostatic hypotension: Secondary | ICD-10-CM | POA: Diagnosis not present

## 2015-07-04 DIAGNOSIS — T148XXA Other injury of unspecified body region, initial encounter: Secondary | ICD-10-CM

## 2015-07-04 DIAGNOSIS — N186 End stage renal disease: Secondary | ICD-10-CM | POA: Diagnosis not present

## 2015-07-04 DIAGNOSIS — T148 Other injury of unspecified body region: Secondary | ICD-10-CM | POA: Diagnosis not present

## 2015-07-04 DIAGNOSIS — I5032 Chronic diastolic (congestive) heart failure: Secondary | ICD-10-CM | POA: Diagnosis not present

## 2015-07-04 DIAGNOSIS — Z794 Long term (current) use of insulin: Secondary | ICD-10-CM | POA: Diagnosis not present

## 2015-07-04 DIAGNOSIS — K3184 Gastroparesis: Secondary | ICD-10-CM | POA: Diagnosis not present

## 2015-07-04 DIAGNOSIS — Z89512 Acquired absence of left leg below knee: Secondary | ICD-10-CM

## 2015-07-04 DIAGNOSIS — E1121 Type 2 diabetes mellitus with diabetic nephropathy: Secondary | ICD-10-CM | POA: Diagnosis not present

## 2015-07-04 NOTE — Progress Notes (Signed)
Patient ID: Jack Chung, male   DOB: [birth year], 75 y.o.   MRN: [id number]    Nursing Home Location:  [place] of Service: SNF (38)  PCP: [name], MD  Allergies  Allergen Reactions  . Flu Virus Vaccine     Gets the flu, patient has bad reaction to the flu vaccine.  . Lipitor [Atorvastatin]     Leg cramps    Chief Complaint  Patient presents with  . Discharge Note    Discharge from facility    HPI:  Patient is a 75 y.o. male seen today at [facility] for discharge home. Pt with a hx of DM with gastroparesis, orthostatic hypotension, hypothyroidism, ESRD,  With recent following hospital stay for left diabetic foot osteomyelitis. He underwent left BKA 06/13/15 and currently at [facility] for short term rehab. Patient currently doing well with therapy, now stable to discharge home with home health.  Review of Systems:  Review of Systems  Constitutional: Negative for activity change, appetite change, fatigue and unexpected weight change.  HENT: Negative for congestion and hearing loss.   Eyes: Negative.   Respiratory: Negative for cough and shortness of breath.   Cardiovascular: Negative for chest pain, palpitations and leg swelling.  Gastrointestinal: Negative for abdominal pain, diarrhea and constipation.  Genitourinary: Negative for dysuria and difficulty urinating.  Musculoskeletal: Negative for myalgias and arthralgias.       Denies pain  Skin: Negative for color change and wound.  Neurological: Negative for dizziness and weakness.  Psychiatric/Behavioral: Negative for behavioral problems, confusion and agitation.    Past Medical History  Diagnosis Date  . Hypertension   . Hypothyroidism   . CHF (congestive heart failure) ([place])   . Anemia   . Blind left eye   . Sleep apnea     not on CPAP  . Coronary artery disease     CABG x 11 Jun 2009.  MRSA infections of incsions  . Arthritis   . Sarcoidosis ([place])     Hx:of  . 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 Edema   . Hyperlipidemia   . 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 Atherosclerosis of native arteries of the extremities, unspecified   . Eczema     Hx: of  . Insomnia   . Seasonal allergies   . History of blood transfusion     with heart surgery  . Coronary atherosclerosis of native coronary artery   . Atherosclerosis of native arteries of the extremities, unspecified   . Pneumonia   . Diabetes mellitus     type 2  . ESRD on dialysis [facility])     peritoneal dialysis - 5 times a time 9a, 12n, 3p, 6p, 9p  . GERD (gastroesophageal reflux disease)   . Neuropathy ([place])   . Cancer ([place])     skin cancer - melanoma on back   Past Surgical History  Procedure Laterality Date  . Cardiac surgery      total of 6 surgeries, 5 related to mrsa  . Coronary artery bypass graft  06/2009  . Cataract surgery      Hx: of  . Debridements      Hx: of secondary to MRSA  . Av fistula placement Right 10/11/2012    Procedure: ARTERIOVENOUS (AV) FISTULA CREATION;  Surgeon: Rosetta Posner, MD;  Location: [place];  Service: Vascular;  Laterality: Right;  . Insertion of  dialysis catheter Left   . Capd insertion N/A 11/28/2012    Procedure: LAPAROSCOPIC PERITONEAL DIALYSIS CATHETER PLACEMENT;  Surgeon: Ralene Ok, MD;  Location: [place];  Service: General;  Laterality: N/A;  . Left heart catheterization with coronary/graft angiogram N/A 01/31/2014    Procedure: LEFT HEART CATHETERIZATION WITH Beatrix Fetters;  Surgeon: Troy Sine, MD;  Location: [facility] CATH LAB;  Service: Cardiovascular;  Laterality: N/A;  . Peripheral vascular catheterization N/A 04/28/2015    Procedure: Abdominal Aortogram w/Lower Extremity;  Surgeon: Conrad Copperhill, MD;  Location: [place] CV LAB;  Service: Cardiovascular;  Laterality: N/A;  . Amputation Left 04/30/2015    Procedure: 1st Ray Amputation Left Foot;  Surgeon: Newt Minion, MD;  Location: [place];   Service: Orthopedics;  Laterality: Left;  . Esophagogastroduodenoscopy N/A 05/20/2015    Procedure: ESOPHAGOGASTRODUODENOSCOPY (EGD);  Surgeon: Wilford Corner, MD;  Location: [facility] ENDOSCOPY;  Service: Endoscopy;  Laterality: N/A;  Possible esophageal dilatation  . Eye surgery Bilateral     cataract surgery with lens implants  . Amputation Left 06/13/2015    Procedure: Left Below Knee Amputation;  Surgeon: Newt Minion, MD;  Location: [place];  Service: Orthopedics;  Laterality: Left;   Social History:   reports that he has never smoked. He has never used smokeless tobacco. He reports that he does not drink alcohol or use illicit drugs.  Family History  Problem Relation Age of Onset  . COPD Mother   . COPD Father     Medications: Patient's Medications  New Prescriptions   No medications on file  Previous Medications   ALLOPURINOL (ZYLOPRIM) 100 MG TABLET    Take 100 mg by mouth daily.   ASPIRIN 325 MG EC TABLET    Take 325 mg by mouth daily.   CALCIUM CARBONATE (TUMS - DOSED IN MG ELEMENTAL CALCIUM) 500 MG CHEWABLE TABLET    Chew 2 tablets by mouth 3 (three) times daily with meals.    CETIRIZINE (ZYRTEC) 10 MG TABLET    Take 10 mg by mouth as needed.    INSULIN ASPART (NOVOLOG) 100 UNIT/ML INJECTION    Inject 10 Units into the skin 3 (three) times daily with meals. Take none if blood sugar < 150, 151-200 2 units, 201-250 4 units, 251-300 6 units, 301-350 8 units   INSULIN GLARGINE (LANTUS) 100 UNIT/ML SOLOSTAR PEN    Inject 20 units into the skin daily. Dx 250.00   LACTOBACILLUS (ACIDOPHILUS PROBIOTIC PO)    Take 2 tablets by mouth daily.   LACTULOSE (CHRONULAC) 10 GM/15ML SOLUTION    Take 10 g by mouth every 4 (four) hours as needed for mild constipation.   LEVOTHYROXINE (SYNTHROID, LEVOTHROID) 112 MCG TABLET    LABS/APPOINTMENT OVERDUE, 1 by mouth daily in the morning 30 minutes before breakfast for Thyroid   LOPERAMIDE (IMODIUM) 2 MG CAPSULE    Take 2 mg by mouth daily as needed for  diarrhea or loose stools.   METOCLOPRAMIDE (REGLAN) 5 MG TABLET    Take 2 tablets (10 mg total) by mouth 3 (three) times daily before meals.   MIDODRINE (PROAMATINE) 10 MG TABLET    Take 1 tablet (10 mg total) by mouth 3 (three) times daily.   MULTIVITAMIN (RENA-VIT) TABS TABLET    Take 1 tablet by mouth daily.   NUTRITIONAL SUPPLEMENTS (FEEDING SUPPLEMENT, NEPRO CARB STEADY,) LIQD    Drink 1 can by mouth three times a day between meals   OMEPRAZOLE (PRILOSEC) 10 MG CAPSULE  Take 10 mg by mouth daily.   OXYCODONE-ACETAMINOPHEN (PERCOCET/ROXICET) 5-325 MG TABLET    Take one tablet by mouth every 6 hours as needed for moderate pain   POTASSIUM CHLORIDE SA (K-DUR,KLOR-CON) 20 MEQ TABLET    Take 20 mEq by mouth daily.  Modified Medications   No medications on file  Discontinued Medications   FEEDING SUPPLEMENT (BOOST / RESOURCE BREEZE) LIQD    Take 1 Container by mouth 3 (three) times daily between meals.   MULTIPLE VITAMIN (MULTI VITAMIN DAILY PO)    Take 1 tablet by mouth daily.     Physical Exam: Filed Vitals:   07/04/15 1129  BP: 112/66  Pulse: 77  Temp: 97.6 F (36.4 C)  Resp: 16  Height: 5\' 9"  (1.753 m)  Weight: 217 lb 4.8 oz (98.567 kg)  SpO2: 100%    Physical Exam  Constitutional: He is oriented to person, place, and time. He appears well-developed and well-nourished.  Sitting in w/c in NAD.  HENT:  Mouth/Throat: Oropharynx is clear and moist.  Eyes: Pupils are equal, round, and reactive to light. No scleral icterus.  Neck: Normal range of motion. Neck supple. Carotid bruit is not present.  Cardiovascular: Normal rate, regular rhythm and intact distal pulses.  Exam reveals no gallop and no friction rub.   Murmur (1/6 SEM) heard. Right LE with palpable DP/PT. Right arm AVF with palpable thrill and audible bruit. No RLE edema.  Pulmonary/Chest: Effort normal and breath sounds normal.  Abdominal: Soft. Bowel sounds are normal. He exhibits no abdominal bruit and no pulsatile  midline mass.  PD cath intact  Musculoskeletal: He exhibits no edema.  Left BKA dressing c/d/i  Lymphadenopathy:    He has no cervical adenopathy.  Neurological: He is alert and oriented to person, place, and time.  Skin: Skin is warm and dry.  Small blood blister noted to right great toe, no erythema, drainage or heat noted   Psychiatric: He has a normal mood and affect. His behavior is normal. Judgment and thought content normal.    Labs reviewed: Basic Metabolic Panel:  Recent Labs  04/23/15 1810  05/20/15 0845 05/20/15 1550  06/16/15 1519 06/18/15 0512 06/19/15 0638 06/21/15 0838  NA  --   < >  --   --   < > 131* 132* 132* 135  K  --   < >  --  4.2  < > 3.9 3.2* 2.7* 3.4*  CL  --   < >  --   --   < > 94* 93* 95* 101  CO2  --   < >  --   --   < > 26 26 24 23   GLUCOSE  --   < >  --   --   < > 237* 213* 182* 81  BUN  --   < >  --   --   < > 32* 37* 45* 34*  CREATININE  --   < >  --   --   < > 5.07* 5.34* 5.48* 4.21*  CALCIUM  --   < >  --   --   < > 8.4* 8.1* 7.9* 7.7*  MG 1.8  --  1.5* 2.0  --   --   --   --   --   PHOS  --   < >  --   --   < > 5.3* 5.1*  --  2.9  < > = values in this interval not displayed.  Liver Function Tests:  Recent Labs  05/18/15 1138 05/19/15 0226  06/13/15 1205  06/16/15 1519 06/18/15 0512 06/21/15 0838  AST 38 24  --  31  --   --   --   --   ALT 18 14*  --  19  --   --   --   --   ALKPHOS 155* 107  --  129*  --   --   --   --   BILITOT 0.5 0.7  --  0.5  --   --   --   --   PROT 7.4 5.4*  --  6.0*  --   --   --   --   ALBUMIN 2.6* 1.9*  < > 2.0*  < > 1.6* 1.5* 1.5*  < > = values in this interval not displayed.  Recent Labs  04/23/15 1446  LIPASE 29   No results for input(s): AMMONIA in the last 8760 hours. CBC:  Recent Labs  04/29/15 2205  05/18/15 1138  06/16/15 1519 06/18/15 0512 06/21/15 0836  WBC 10.8*  < > 9.7  < > 11.4* 10.2 10.4  NEUTROABS 7.4  --  7.3  --   --   --   --   HGB 11.8*  < > 14.3  < > 9.6* 9.3* 8.7*    HCT 35.9*  < > 44.3  < > 30.8* 28.9* 28.0*  MCV 96.8  < > 101.4*  < > 100.0 99.7 99.6  PLT 374  < > 365  < > 365 388 352  < > = values in this interval not displayed. TSH:  Recent Labs  04/23/15 2130 05/20/15 0845  TSH 6.805* 5.075*   A1C: Lab Results  Component Value Date   HGBA1C 8.7* 04/23/2015   Lipid Panel: No results for input(s): CHOL, HDL, LDLCALC, TRIG, CHOLHDL, LDLDIRECT in the last 8760 hours.    Assessment/Plan 1. Status post below knee amputation of left lower extremity (HCC) Due to left diabetic foot osteomyelitis, stump well healed, conts with in place. Without pain. Ongoing follow up with ortho.   2. Type 2 diabetes mellitus with diabetic nephropathy, with long-term current use of insulin (HCC) Stable at this time. conts on lantus and SSI   3. ESRD on dialysis [facility]) Stable, conts on Dialysis Tuesday, Thursday, Saturday   4. Chronic diastolic congestive heart failure (HCC) Remains stable, managed with dialysis   5. Friction blisters of the skin No signs of infection at this time. To avoid pressure or friction. Closely monitor.   6. Oriction blisters of the skin No signs of infection at this time. To avoid pressure or friction. Closely monitorrthostatic hypotension -stable, conts on midodrine 10 mg TID  7. Gastroparesis Stable on reglan and prilosec   pt is stable for discharge-will need PT/OT per home health. DME needed sliding board, WC, walker, 3n1,. Rx written.  will need to follow up with PCP within 2 weeks.    Carlos American. Harle Battiest  [facility] & Adult Medicine [phone] 8 am - 5 pm) [phone] (after hours)

## 2015-08-01 ENCOUNTER — Encounter: Payer: Self-pay | Admitting: Vascular Surgery

## 2015-08-01 ENCOUNTER — Ambulatory Visit (INDEPENDENT_AMBULATORY_CARE_PROVIDER_SITE_OTHER): Payer: Medicare Other | Admitting: Vascular Surgery

## 2015-08-01 VITALS — BP 147/78 | HR 77 | Temp 97.3°F | Ht 69.0 in | Wt 217.0 lb

## 2015-08-01 DIAGNOSIS — I7025 Atherosclerosis of native arteries of other extremities with ulceration: Secondary | ICD-10-CM | POA: Diagnosis not present

## 2015-08-01 NOTE — Progress Notes (Signed)
Established Critical Limb Ischemia Patient  History of Present Illness  Jack Chung is a 75 y.o. (1940-10-06) male who presents with chief complaint: right great toe ulcer.  This diabetic previously has undergone Aortogram with bilateral runoff.  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  Recently, he had an abrasion of his right great toe which had blackened.  The patient's ambulation is limited and he remains in a care facilitiy.  The patient has no rest pain.  He denies any drainage from the right great toe but it has sharp pain, 5-10/10, with manipulation.  His next appointment with [name] is coming up.  The patient's PMH, PSH, SH, and FamHx are unchanged from 04/29/15.  Current Outpatient Prescriptions  Medication Sig Dispense Refill  . allopurinol (ZYLOPRIM) 100 MG tablet Take 100 mg by mouth daily.    Marland Kitchen aspirin 325 MG EC tablet Take 325 mg by mouth daily.    . calcium carbonate (TUMS - DOSED IN MG ELEMENTAL CALCIUM) 500 MG chewable tablet Chew 2 tablets by mouth 3 (three) times daily with meals.     . cetirizine (ZYRTEC) 10 MG tablet Take 10 mg by mouth as needed.     . doxycycline (VIBRAMYCIN) 100 MG capsule   0  . gentamicin cream (GARAMYCIN) 0.1 % APPLY TO THE AFFECTED AREAS AS DIRECTED.  3  . insulin aspart (NOVOLOG) 100 UNIT/ML injection Inject 10 Units into the skin 3 (three) times daily with meals. To give 10 units plus SSI -Take none if blood sugar < 150, 151-200 2 units, 201-250 4 units, 251-300 6 units, 301-350 8 units    . Insulin Glargine (LANTUS) 100 UNIT/ML Solostar Pen Inject 20 units into the skin daily. Dx 250.00 15 mL 6  . Lactobacillus (ACIDOPHILUS PROBIOTIC PO) Take 2 tablets by mouth daily.    Marland Kitchen lactulose (CHRONULAC) 10 GM/15ML solution Take 10 g by mouth every 4 (four) hours as needed for mild constipation.    Marland Kitchen levothyroxine  (SYNTHROID, LEVOTHROID) 112 MCG tablet LABS/APPOINTMENT OVERDUE, 1 by mouth daily in the morning 30 minutes before breakfast for Thyroid 15 tablet 0  . loperamide (IMODIUM) 2 MG capsule Take 2 mg by mouth daily as needed for diarrhea or loose stools.    . metoCLOPramide (REGLAN) 5 MG tablet Take 2 tablets (10 mg total) by mouth 3 (three) times daily before meals. 180 tablet 0  . midodrine (PROAMATINE) 10 MG tablet Take 1 tablet (10 mg total) by mouth 3 (three) times daily. 270 tablet 3  . multivitamin (RENA-VIT) TABS tablet Take 1 tablet by mouth daily.    . nitroGLYCERIN (NITRODUR - DOSED IN MG/24 HR) 0.2 mg/hr patch   0  . Nutritional Supplements (FEEDING SUPPLEMENT, NEPRO CARB STEADY,) LIQD Drink 1 can by mouth three times a day between meals    . potassium chloride SA (K-DUR,KLOR-CON) 20 MEQ tablet Take 20 mEq by mouth daily.    Marland Kitchen omeprazole (PRILOSEC) 10 MG capsule Take 10 mg by mouth daily. Reported on 08/01/2015    . oxyCODONE-acetaminophen (PERCOCET/ROXICET) 5-325 MG tablet Take one tablet by mouth every 6 hours as needed for moderate pain (Patient not taking: Reported on 08/01/2015) 120 tablet 0   No current facility-administered medications for this visit.    Allergies  Allergen Reactions  . Flu Virus Vaccine     Gets the flu,  patient has bad reaction to the flu vaccine.  . Lipitor [Atorvastatin]     Leg cramps    On ROS today: +gangrenous changes to R great toe, no drainage, no fever or chills   Physical Examination  Filed Vitals:   08/01/15 1535 08/01/15 1536  BP: 146/77 147/78  Pulse: 77   Temp: 97.3 F (36.3 C)   TempSrc: Oral   Height: 5\' 9"  (1.753 m)   Weight: 217 lb (98.431 kg)   SpO2: 100%    Body mass index is 32.03 kg/(m^2).  General: A&O x 3, WDWN  Eyes: PERRLA, EOMI  Pulmonary: Sym exp, good air movt, CTAB, no rales, rhonchi, & wheezing  Cardiac: RRR, Nl S1, S2, no Murmurs, rubs or gallops  Vascular: Vessel Right Left  Radial Palpable Palpable    Brachial Palpable Palpable  Carotid Palpable, without bruit Palpable, without bruit  Aorta Not palpable N/A  Femoral Palpable Palpable  Popliteal Not palpable Not palpable  PT Not Palpable BKA  DP Not Palpable BKA   Gastrointestinal: soft, NTND, no G/R, no HSM, no masses, no CVAT B  Musculoskeletal: M/S 5/5 throughout , L BKA, R great toe with black eschar extending into soft tissue  Neurologic: Pain and light touch intact in extremities , Motor exam as listed above   Medical Decision Making  Jack Chung is a 75 y.o. male who presents with: R great toe gangrene, s/p L BKA   Based on the patient's vascular studies and examination, I have offered the patient: R ABI w/ TBI and RLE arterial duplex.  I doubt there is much that can be done to improve this patient's blood flow, as his prior angiogram demonstrates substantial digital artery disease.    The screening non-invasives studies are being done to see if there might be a lesion is further compromising his distal flow.  I discussed in depth with the patient the nature of atherosclerosis, and emphasized the importance of maximal medical management including strict control of blood pressure, blood glucose, and lipid levels, antiplatelet agents, obtaining regular exercise, and cessation of smoking.    The patient is aware that without maximal medical management the underlying atherosclerotic disease process will progress, limiting the benefit of any interventions. The patient is currently not on a statin: due allergy. The patient is currently on an anti-platelet: ASA. He will follow up in one week with this study.  Thank you for allowing [place] to participate in this patient's care.   Adele Barthel, MD [facility] Office: [phone] Pager: [phone]  08/01/2015, 4:37 PM

## 2015-08-04 NOTE — Addendum Note (Signed)
Addended by: Mena Goes on: 08/04/2015 10:07 AM   Modules accepted: Orders

## 2015-08-05 ENCOUNTER — Other Ambulatory Visit: Payer: Self-pay | Admitting: Nurse Practitioner

## 2015-08-07 ENCOUNTER — Ambulatory Visit (HOSPITAL_COMMUNITY)
Admission: RE | Admit: 2015-08-07 | Discharge: 2015-08-07 | Disposition: A | Discharge status: Home / Self Care | Payer: Medicare Other | Source: Ambulatory Visit | Attending: Vascular Surgery | Admitting: Vascular Surgery

## 2015-08-07 ENCOUNTER — Ambulatory Visit (INDEPENDENT_AMBULATORY_CARE_PROVIDER_SITE_OTHER)
Admission: RE | Admit: 2015-08-07 | Discharge: 2015-08-07 | Disposition: A | Discharge status: Home / Self Care | Payer: Medicare Other | Source: Ambulatory Visit | Attending: Vascular Surgery | Admitting: Vascular Surgery

## 2015-08-07 DIAGNOSIS — I132 Hypertensive heart and chronic kidney disease with heart failure and with stage 5 chronic kidney disease, or end stage renal disease: Secondary | ICD-10-CM | POA: Diagnosis not present

## 2015-08-07 DIAGNOSIS — Z992 Dependence on renal dialysis: Secondary | ICD-10-CM | POA: Diagnosis not present

## 2015-08-07 DIAGNOSIS — I509 Heart failure, unspecified: Secondary | ICD-10-CM | POA: Insufficient documentation

## 2015-08-07 DIAGNOSIS — I7025 Atherosclerosis of native arteries of other extremities with ulceration: Secondary | ICD-10-CM | POA: Diagnosis not present

## 2015-08-07 DIAGNOSIS — R0989 Other specified symptoms and signs involving the circulatory and respiratory systems: Secondary | ICD-10-CM | POA: Diagnosis present

## 2015-08-07 DIAGNOSIS — E1122 Type 2 diabetes mellitus with diabetic chronic kidney disease: Secondary | ICD-10-CM | POA: Diagnosis not present

## 2015-08-07 DIAGNOSIS — E785 Hyperlipidemia, unspecified: Secondary | ICD-10-CM | POA: Insufficient documentation

## 2015-08-07 DIAGNOSIS — N186 End stage renal disease: Secondary | ICD-10-CM | POA: Insufficient documentation

## 2015-08-07 DIAGNOSIS — E11319 Type 2 diabetes mellitus with unspecified diabetic retinopathy without macular edema: Secondary | ICD-10-CM | POA: Diagnosis not present

## 2015-08-11 ENCOUNTER — Encounter: Payer: Self-pay | Admitting: Vascular Surgery

## 2015-08-15 ENCOUNTER — Ambulatory Visit (INDEPENDENT_AMBULATORY_CARE_PROVIDER_SITE_OTHER): Payer: Medicare Other | Admitting: Vascular Surgery

## 2015-08-15 ENCOUNTER — Encounter: Payer: Self-pay | Admitting: Vascular Surgery

## 2015-08-15 VITALS — BP 116/64 | HR 85 | Temp 98.0°F | Resp 16 | Ht 69.0 in | Wt 226.0 lb

## 2015-08-15 DIAGNOSIS — I7025 Atherosclerosis of native arteries of other extremities with ulceration: Secondary | ICD-10-CM

## 2015-08-15 NOTE — Progress Notes (Signed)
Established Critical Limb Ischemia Patient  History of Present Illness  Jack Chung is a 75 y.o. (01/30/1941) male   who presents with chief complaint: right great toe ulcer.  The patient and wife think the toe is healing.  He returns today for non-invasive studies on the right leg.  He denies any rest pain.  The patient's PMH, PSH, SH, and FamHx are unchanged from 08/01/15 . Current Outpatient Prescriptions  Medication Sig Dispense Refill  . allopurinol (ZYLOPRIM) 100 MG tablet Take 100 mg by mouth daily.    Marland Kitchen aspirin 325 MG EC tablet Take 325 mg by mouth daily.    . calcium carbonate (TUMS - DOSED IN MG ELEMENTAL CALCIUM) 500 MG chewable tablet Chew 2 tablets by mouth 3 (three) times daily with meals.     . cetirizine (ZYRTEC) 10 MG tablet Take 10 mg by mouth as needed.     . doxycycline (VIBRAMYCIN) 100 MG capsule   0  . gentamicin cream (GARAMYCIN) 0.1 % APPLY TO THE AFFECTED AREAS AS DIRECTED.  3  . insulin aspart (NOVOLOG) 100 UNIT/ML injection Inject 10 Units into the skin 3 (three) times daily with meals. To give 10 units plus SSI -Take none if blood sugar < 150, 151-200 2 units, 201-250 4 units, 251-300 6 units, 301-350 8 units    . Insulin Glargine (LANTUS) 100 UNIT/ML Solostar Pen Inject 20 units into the skin daily. Dx 250.00 15 mL 6  . Lactobacillus (ACIDOPHILUS PROBIOTIC PO) Take 2 tablets by mouth daily.    Marland Kitchen lactulose (CHRONULAC) 10 GM/15ML solution Take 10 g by mouth every 4 (four) hours as needed for mild constipation.    Marland Kitchen levothyroxine (SYNTHROID, LEVOTHROID) 112 MCG tablet LABS/APPOINTMENT OVERDUE, 1 by mouth daily in the morning 30 minutes before breakfast for Thyroid 15 tablet 0  . loperamide (IMODIUM) 2 MG capsule Take 2 mg by mouth daily as needed for diarrhea or loose stools.    . metoCLOPramide (REGLAN) 5 MG tablet Take 2 tablets (10 mg total) by mouth 3 (three) times daily before meals. 180 tablet 0  . midodrine (PROAMATINE) 10 MG tablet Take 1 tablet (10 mg  total) by mouth 3 (three) times daily. 270 tablet 3  . multivitamin (RENA-VIT) TABS tablet Take 1 tablet by mouth daily.    . nitroGLYCERIN (NITRODUR - DOSED IN MG/24 HR) 0.2 mg/hr patch   0  . Nutritional Supplements (FEEDING SUPPLEMENT, NEPRO CARB STEADY,) LIQD Drink 1 can by mouth three times a day between meals    . omeprazole (PRILOSEC) 10 MG capsule Take 10 mg by mouth daily. Reported on 08/01/2015    . potassium chloride SA (K-DUR,KLOR-CON) 20 MEQ tablet Take 20 mEq by mouth daily.    Marland Kitchen oxyCODONE-acetaminophen (PERCOCET/ROXICET) 5-325 MG tablet Take one tablet by mouth every 6 hours as needed for moderate pain (Patient not taking: Reported on 08/15/2015) 120 tablet 0   No current facility-administered medications for this visit.    On ROS today: no fever or chills, no drainage, improvement in right great toe  Physical Examination Filed Vitals:   08/15/15 1035  BP: 116/64  Pulse: 85  Temp: 98 F (36.7 C)  Resp: 16  Height: 5\' 9"  (1.753 m)  Weight: 226 lb (102.513 kg)  SpO2: 100%   Body mass index is 33.36 kg/(m^2).  General: A&O x 3, WDWN  Eyes: PERRLA, EOMI  Pulmonary: Sym exp, good air movt, CTAB, no rales, rhonchi, & wheezing  Cardiac: RRR, Nl S1,  S2, no Murmurs, rubs or gallops  Vascular: Vessel Right Left  Radial Palpable Palpable  Brachial Palpable Palpable  Carotid Palpable, without bruit Palpable, without bruit  Aorta Not palpable N/A  Femoral Palpable Palpable  Popliteal Not palpable Not palpable  PT Not Palpable BKA  DP Not Palpable BKA   Gastrointestinal: soft, NTND, no G/R, no HSM, no masses, no CVAT B  Musculoskeletal: M/S 5/5 throughout , L BKA, R great toe with what appears to be traumatized skin at the tip of the great toe  Neurologic: Pain and light touch intact in extremities , Motor exam as listed above  RLE arterial duplex (08/07/15)  Triphasic throughout except PTA which is occluded  ABI (Date: 08/15/2015)  R:     ABI: [place],   DP: mono,   PT: tri,   Peroneal: tri  TBI: not done due to wound  SFA stenosis w/ 2.4 ratio c/w 50-75% stenosis  Medical Decision Making  Jack Chung is a 75 y.o. (1940-08-26) male  who presents with: possible R great toe trauma, s/p L BKA, R SFA stenosis 50-75%    I agree the toe looks better today.  It appears there is a SFA stenosis that may be be 50-75% which might help the blood flow down to the level of the ankle but there continues to be significant digital artery atherosclerosis which is the root cause of problems here.  Pt has follow up with [name] planned.  Given pt is improving, will give him another month to see if he can heal himself without further intervention.  If not, will likely need: RLE angiogram, OA+PTA R SFA.  I discussed in depth with the patient the nature of atherosclerosis, and emphasized the importance of maximal medical management including strict control of blood pressure, blood glucose, and lipid levels, antiplatelet agents, obtaining regular exercise, and cessation of smoking.   The patient is aware that without maximal medical management the underlying atherosclerotic disease process will progress, limiting the benefit of any interventions.  The patient is currently not on a statin: due allergy.  The patient is currently on an anti-platelet: ASA.  He will follow up in one month for wound check.  Thank you for allowing [place] to participate in this patient's care.   Adele Barthel, MD, FACS [facility] Office: [phone] Pager: [phone]  08/15/2015, 12:28 PM

## 2015-08-25 ENCOUNTER — Other Ambulatory Visit: Payer: Self-pay | Admitting: Nurse Practitioner

## 2015-09-05 ENCOUNTER — Ambulatory Visit (INDEPENDENT_AMBULATORY_CARE_PROVIDER_SITE_OTHER): Payer: Medicare Other | Admitting: Internal Medicine

## 2015-09-05 ENCOUNTER — Encounter: Payer: Self-pay | Admitting: Internal Medicine

## 2015-09-05 VITALS — BP 122/62 | HR 68 | Ht 69.0 in | Wt 233.0 lb

## 2015-09-05 DIAGNOSIS — N186 End stage renal disease: Secondary | ICD-10-CM

## 2015-09-05 DIAGNOSIS — I441 Atrioventricular block, second degree: Secondary | ICD-10-CM | POA: Diagnosis not present

## 2015-09-05 DIAGNOSIS — Z951 Presence of aortocoronary bypass graft: Secondary | ICD-10-CM

## 2015-09-05 DIAGNOSIS — I5032 Chronic diastolic (congestive) heart failure: Secondary | ICD-10-CM

## 2015-09-05 DIAGNOSIS — Z89512 Acquired absence of left leg below knee: Secondary | ICD-10-CM | POA: Diagnosis not present

## 2015-09-05 DIAGNOSIS — Z992 Dependence on renal dialysis: Secondary | ICD-10-CM

## 2015-09-05 NOTE — Patient Instructions (Signed)
Your physician wants you to follow-up in: 6 MONTHS WITH DR HILTY You will receive a reminder letter in the mail two months in advance. If you don't receive a letter, please call our office to schedule the follow-up appointment.   

## 2015-09-07 NOTE — Progress Notes (Signed)
OFFICE NOTE  Chief Complaint:  Follow-up blood pressure  Primary Care Physician: [name], MD  HPI:  Jack Chung is a 75 year old male who was referred to me for to establish cardiac care. 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 In 2011 apparently he was working on putting a roof on his house and came in later in the day. His wife found him apparently sitting by the side of his bed and he was blue, confused and short of breath. She called 911 and was taken to the emergency room. Ultimately his EKG demonstrated probably acute MI and he underwent cardiac catheterization which showed multivessel coronary artery disease. At that time there were no clear areas that would be amenable to percutaneous intervention. A surgical consult was obtained and per his report 3 different surgeons would not operate on him but eventually Dr. Roxanna Mew did.  According to his operative report there was significant multivessel coronary disease. The LAD was heavily calcified throughout its length and closed 95% proximally. The mid and distal portion of the vessel had a lumen diameter 1.25 mm and were heavily calcified. The circumflex obtuse marginal branch was patent. The posterior lateral branch was a 2 mm vessel. The right coronary artery was totally occluded. The posterior descending artery was also a very small vessel about 1.5-2 mm. He underwent a three-vessel bypass and did fairly well although had a difficult recovery. 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  He was recently hospitalized for fever of unknown origin. He underwent an echocardiogram which showed an EF of 50-55% with grade 2 diastolic dysfunction. Since that time he said progressive shortness of breath but denies any chest discomfort. He said he  never had any chest pain even prior to his episode where he presented for bypass surgery. Angina may not be a reliable marker this gentleman.  Jack Chung returns for followup. In the interim he underwent nuclear stress test which was intermediate and show partial reversibility concerning for ischemia. He was ultimately referred for cardiac catheterization which was performed by Dr. Claiborne Billings. This demonstrated the following findings:  IMPRESSION:  Severe native coronary artery disease with evidence for coronary calcification and 95% proximal LAD stenosis between the first and second diagonal vessel with subtotal LAD occlusion beyond the second diagonal vessel; subtotal occlusion of the native circumflex vessel proximally with faint filling of a marginal branch prior to total mid occlusion, and total occlusion of the mid RCA. Patent LIMA graft supplying the mid LAD with diffuse 80% mid LAD stenoses and a small caliber vessel and 95% apical LAD stenosis. Sequential vein graft supplying the PDA branch of the RCA and a distal marginal-like branch of the left circumflex coronary artery.  Medical therapy was recommended. He was started on a nitrate and his symptoms of chest pressure have improved.  Jack Chung returns today for follow-up. He was an add-on for persistent cough. Apparently this is been going on for couple of months. He saw his primary care provider who recommended cetirizine. He recently has had a cough which is nonproductive and feels like it's hanging in his chest. Blood pressure is also been low on dialysis days. There is concern for worsening heart failure from his wife who noted that he's had some more swelling in his legs. His weight is stated virtually stable but she feels  that he's eating last and therefore may be gaining equivalent fluid weight. Recently he's had an increase in the ultrafiltration with dialysis to try to remove more fluid. They tell me that he also was noted to have possible  peritonitis and is on vancomycin for an elevated white blood cell count of 13,000. That being said, she felt that her nephrologist did not feel this was SBP but another source of infection.  Jack Chung returns today for follow-up. He reports his cough has mostly resolved. He's been having problems with very low blood pressure at home with peritoneal dialysis. He's been taken off of almost all of his medications except for losartan 25 mg. We were unable to obtain a blood pressure in the office today however he was sitting up and conversive. He recently had a repeat echo which shows normal systolic function and a small area of inferior hypokinesis. The etiology of his hypo-tension is not clear, but will necessitate further treatment.  Saw Jack Chung back in the office today. At his last office visit I started him on midodrine 10 mg 3 times a day. This is been exceptionally helpful for his low blood pressures. He continues with home peritoneal dialysis and a blood pressures generally range between [id number] and 0000000 systolic based on his flow sheets. Overall he feels better. He was also noted to be significantly anemic and is since received iron infusions and is breathing somewhat better with improvement in his hematocrit. He denies any syncope or presyncope.  Jack Chung returns today for follow-up. Unfortunately he underwent left BKA, but is now seeing Dr. Bridgett Larsson with the hopes of salvaging his right lower extremity. He denies any chest pain. Blood pressure has been running up a little bit higher and he's not required as much midodrine. He now takes it only twice daily and typically on dialysis days.  PMHx:  Past Medical History  Diagnosis Date  . Hypertension   . Hypothyroidism   . CHF (congestive heart failure) ([place])   . Anemia   . Blind left eye   . Sleep apnea     not on CPAP  . Coronary artery disease     CABG x 11 Jun 2009.  MRSA infections of incsions  . Arthritis   . Sarcoidosis ([place])     Hx:of  .  Diabetic retinopathy ([place])     Hx: of bilateral  . Edema   . Hyperlipidemia   . 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 Atherosclerosis of native arteries of the extremities, unspecified   . Eczema     Hx: of  . Insomnia   . Seasonal allergies   . History of blood transfusion     with heart surgery  . Coronary atherosclerosis of native coronary artery   . Atherosclerosis of native arteries of the extremities, unspecified   . Pneumonia   . Diabetes mellitus     type 2  . ESRD on dialysis [facility])     peritoneal dialysis - 5 times a time 9a, 12n, 3p, 6p, 9p  . GERD (gastroesophageal reflux disease)   . Neuropathy ([place])   . Cancer ([place])     skin cancer - melanoma on back    Past Surgical History  Procedure Laterality Date  . Cardiac surgery      total of 6 surgeries, 5 related to mrsa  . Coronary artery bypass graft  06/2009  . Cataract surgery      Hx: of  .  Debridements      Hx: of secondary to MRSA  . Av fistula placement Right 10/11/2012    Procedure: ARTERIOVENOUS (AV) FISTULA CREATION;  Surgeon: Rosetta Posner, MD;  Location: [address];  Service: Vascular;  Laterality: Right;  . Insertion of dialysis catheter Left   . Capd insertion N/A 11/28/2012    Procedure: LAPAROSCOPIC PERITONEAL DIALYSIS CATHETER PLACEMENT;  Surgeon: Ralene Ok, MD;  Location: [place];  Service: General;  Laterality: N/A;  . Left heart catheterization with coronary/graft angiogram N/A 01/31/2014    Procedure: LEFT HEART CATHETERIZATION WITH Beatrix Fetters;  Surgeon: Troy Sine, MD;  Location: [facility] CATH LAB;  Service: Cardiovascular;  Laterality: N/A;  . Peripheral vascular catheterization N/A 04/28/2015    Procedure: Abdominal Aortogram w/Lower Extremity;  Surgeon: Conrad White Center, MD;  Location: [place] CV LAB;  Service: Cardiovascular;  Laterality: N/A;  . Amputation Left 04/30/2015    Procedure: 1st Ray Amputation Left Foot;  Surgeon: Newt Minion, MD;  Location: [place];  Service: Orthopedics;  Laterality: Left;  . Esophagogastroduodenoscopy N/A 05/20/2015    Procedure: ESOPHAGOGASTRODUODENOSCOPY (EGD);  Surgeon: Wilford Corner, MD;  Location: [facility] ENDOSCOPY;  Service: Endoscopy;  Laterality: N/A;  Possible esophageal dilatation  . Eye surgery Bilateral     cataract surgery with lens implants  . Amputation Left 06/13/2015    Procedure: Left Below Knee Amputation;  Surgeon: Newt Minion, MD;  Location: [place];  Service: Orthopedics;  Laterality: Left;    FAMHx:  Family History  Problem Relation Age of Onset  . COPD Mother   . COPD Father     SOCHx:   reports that he has never smoked. He has never used smokeless tobacco. He reports that he does not drink alcohol or use illicit drugs.  ALLERGIES:  Allergies  Allergen Reactions  . Flu Virus Vaccine     Gets the flu, patient has bad reaction to the flu vaccine.  . Lipitor [Atorvastatin]     Leg cramps    ROS: A comprehensive review of systems was negative except for: Constitutional: positive for fatigue Respiratory: positive for dyspnea on exertion  HOME MEDS: Current Outpatient Prescriptions  Medication Sig Dispense Refill  . allopurinol (ZYLOPRIM) 100 MG tablet Take 100 mg by mouth daily.    Marland Kitchen aspirin 325 MG EC tablet Take 325 mg by mouth daily.    . calcium carbonate (TUMS - DOSED IN MG ELEMENTAL CALCIUM) 500 MG chewable tablet Chew 2 tablets by mouth 3 (three) times daily with meals.     . cetirizine (ZYRTEC) 10 MG tablet Take 10 mg by mouth as needed.     Marland Kitchen gentamicin cream (GARAMYCIN) 0.1 % APPLY TO THE AFFECTED AREAS AS DIRECTED.  3  . insulin aspart (NOVOLOG) 100 UNIT/ML injection Inject 10 Units into the skin 3 (three) times daily with meals. To give 10 units plus SSI -Take none if blood sugar < 150, 151-200 2 units, 201-250 4 units, 251-300 6 units, 301-350 8 units    . Insulin Glargine (LANTUS) 100 UNIT/ML Solostar Pen Inject 20 units into the skin daily. Dx 250.00 15 mL 6  .  Lactobacillus (ACIDOPHILUS PROBIOTIC PO) Take 2 tablets by mouth daily.    Marland Kitchen lactulose (CHRONULAC) 10 GM/15ML solution Take 10 g by mouth every 4 (four) hours as needed for mild constipation.    Marland Kitchen levothyroxine (SYNTHROID, LEVOTHROID) 112 MCG tablet LABS/APPOINTMENT OVERDUE, 1 by mouth daily in the morning 30 minutes before breakfast for Thyroid 15  tablet 0  . loperamide (IMODIUM) 2 MG capsule Take 2 mg by mouth daily as needed for diarrhea or loose stools.    . metoCLOPramide (REGLAN) 5 MG tablet Take 2 tablets (10 mg total) by mouth 3 (three) times daily before meals. 180 tablet 0  . midodrine (PROAMATINE) 10 MG tablet Take 1 tablet (10 mg total) by mouth 3 (three) times daily. 270 tablet 3  . multivitamin (RENA-VIT) TABS tablet Take 1 tablet by mouth daily.    . nitroGLYCERIN (NITRODUR - DOSED IN MG/24 HR) 0.2 mg/hr patch Place 0.2 mg onto the skin daily.   0  . Nutritional Supplements (FEEDING SUPPLEMENT, NEPRO CARB STEADY,) LIQD Drink 1 can by mouth three times a day between meals    . omeprazole (PRILOSEC) 10 MG capsule Take 10 mg by mouth daily. Reported on 08/01/2015    . oxyCODONE-acetaminophen (PERCOCET/ROXICET) 5-325 MG tablet Take one tablet by mouth every 6 hours as needed for moderate pain 120 tablet 0  . potassium chloride SA (K-DUR,KLOR-CON) 20 MEQ tablet Take 20 mEq by mouth daily.     No current facility-administered medications for this visit.    LABS/IMAGING: No results found for this or any previous visit (from the past 48 hour(s)). No results found.  VITALS: BP 122/62 mmHg  Pulse 68  Ht 5\' 9"  (1.753 m)  Wt 233 lb (105.688 kg)  BMI 34.39 kg/m2  EXAM: General appearance: alert and no distress Neck: no carotid bruit, no JVD and thyroid not enlarged, symmetric, no tenderness/mass/nodules Lungs: diminished breath sounds bilaterally Heart: regular rate and rhythm, S1, S2 normal, no murmur, click, rub or gallop Abdomen: soft, non-tender; bowel sounds normal; no masses,   no organomegaly Extremities: Left BKA Pulses: Faint distal pulses in the right lower extremity Skin: Skin color, texture, turgor normal. No rashes or lesions Neurologic: Mental status: Alert, oriented, thought content appropriate Psych: Flat affect  EKG: Deferred  ASSESSMENT: 1. Multivessel coronary artery disease status post 3 vessel CABG in 2011 2. Insulin-dependent diabetes 3. Sarcoidosis on prednisone 4. Hypertension 5. Dyslipidemia 6. End-stage renal disease on peritoneal dialysis 7. Obesity 8. Recent fever of unknown origin with leukocytosis 9. Nonproductive cough 10. Ischemic cardiomyopathy EF 45% -> now improved to 55-60%  11. Orthostatic hypotension-improved on midodrine 12. Left BKA 13. Severe PAD  PLAN: 1.   Jack Chung has had improvement in orthostatic hypotension on midodrine. We should continue this as he continues his dialysis. EF is improved to 55-60%, there is no evidence that any of his remaining shortness of breath is related a cardiac process. He was markedly anemic and that has improved as well with some improvement in energy level. Unfortunately he's had progressive PAD and underwent left BKA. He is now working with Dr. Bridgett Larsson to try to salvage the right lower extremity. We will continue his current medical regimen and plan to see him back in 6 months.  Pixie Casino, MD, [facility] Attending Cardiologist Chester C Hilty 09/07/2015, 4:17 PM

## 2015-09-12 ENCOUNTER — Ambulatory Visit: Payer: Medicare Other | Admitting: Vascular Surgery

## 2015-09-17 ENCOUNTER — Encounter: Payer: Self-pay | Admitting: Vascular Surgery

## 2015-09-22 ENCOUNTER — Encounter (HOSPITAL_COMMUNITY): Payer: Self-pay | Admitting: Emergency Medicine

## 2015-09-22 ENCOUNTER — Emergency Department (HOSPITAL_COMMUNITY): Payer: Medicare Other

## 2015-09-22 ENCOUNTER — Observation Stay (HOSPITAL_COMMUNITY)
Admission: EM | Admit: 2015-09-22 | Discharge: 2015-09-24 | Disposition: A | Discharge status: Home / Self Care | Payer: Medicare Other | Attending: Internal Medicine | Admitting: Internal Medicine

## 2015-09-22 DIAGNOSIS — T68XXXA Hypothermia, initial encounter: Secondary | ICD-10-CM | POA: Diagnosis not present

## 2015-09-22 DIAGNOSIS — K219 Gastro-esophageal reflux disease without esophagitis: Secondary | ICD-10-CM | POA: Insufficient documentation

## 2015-09-22 DIAGNOSIS — X31XXXA Exposure to excessive natural cold, initial encounter: Secondary | ICD-10-CM | POA: Diagnosis not present

## 2015-09-22 DIAGNOSIS — H5442 Blindness, left eye, normal vision right eye: Secondary | ICD-10-CM | POA: Diagnosis not present

## 2015-09-22 DIAGNOSIS — E785 Hyperlipidemia, unspecified: Secondary | ICD-10-CM | POA: Insufficient documentation

## 2015-09-22 DIAGNOSIS — I2510 Atherosclerotic heart disease of native coronary artery without angina pectoris: Secondary | ICD-10-CM | POA: Diagnosis not present

## 2015-09-22 DIAGNOSIS — E11649 Type 2 diabetes mellitus with hypoglycemia without coma: Principal | ICD-10-CM | POA: Insufficient documentation

## 2015-09-22 DIAGNOSIS — G4730 Sleep apnea, unspecified: Secondary | ICD-10-CM | POA: Diagnosis not present

## 2015-09-22 DIAGNOSIS — E11319 Type 2 diabetes mellitus with unspecified diabetic retinopathy without macular edema: Secondary | ICD-10-CM | POA: Diagnosis not present

## 2015-09-22 DIAGNOSIS — D869 Sarcoidosis, unspecified: Secondary | ICD-10-CM | POA: Insufficient documentation

## 2015-09-22 DIAGNOSIS — L0390 Cellulitis, unspecified: Secondary | ICD-10-CM | POA: Diagnosis present

## 2015-09-22 DIAGNOSIS — Z859 Personal history of malignant neoplasm, unspecified: Secondary | ICD-10-CM | POA: Diagnosis not present

## 2015-09-22 DIAGNOSIS — I120 Hypertensive chronic kidney disease with stage 5 chronic kidney disease or end stage renal disease: Secondary | ICD-10-CM | POA: Diagnosis not present

## 2015-09-22 DIAGNOSIS — IMO0002 Reserved for concepts with insufficient information to code with codable children: Secondary | ICD-10-CM

## 2015-09-22 DIAGNOSIS — E039 Hypothyroidism, unspecified: Secondary | ICD-10-CM | POA: Diagnosis not present

## 2015-09-22 DIAGNOSIS — D649 Anemia, unspecified: Secondary | ICD-10-CM | POA: Diagnosis present

## 2015-09-22 DIAGNOSIS — N186 End stage renal disease: Secondary | ICD-10-CM

## 2015-09-22 DIAGNOSIS — E162 Hypoglycemia, unspecified: Secondary | ICD-10-CM | POA: Diagnosis not present

## 2015-09-22 DIAGNOSIS — Z951 Presence of aortocoronary bypass graft: Secondary | ICD-10-CM | POA: Diagnosis not present

## 2015-09-22 DIAGNOSIS — Z7982 Long term (current) use of aspirin: Secondary | ICD-10-CM | POA: Diagnosis not present

## 2015-09-22 DIAGNOSIS — E1142 Type 2 diabetes mellitus with diabetic polyneuropathy: Secondary | ICD-10-CM | POA: Diagnosis present

## 2015-09-22 DIAGNOSIS — M1990 Unspecified osteoarthritis, unspecified site: Secondary | ICD-10-CM | POA: Insufficient documentation

## 2015-09-22 DIAGNOSIS — Z79899 Other long term (current) drug therapy: Secondary | ICD-10-CM | POA: Diagnosis not present

## 2015-09-22 DIAGNOSIS — I5032 Chronic diastolic (congestive) heart failure: Secondary | ICD-10-CM | POA: Diagnosis not present

## 2015-09-22 DIAGNOSIS — Z992 Dependence on renal dialysis: Secondary | ICD-10-CM | POA: Diagnosis not present

## 2015-09-22 DIAGNOSIS — G4700 Insomnia, unspecified: Secondary | ICD-10-CM | POA: Diagnosis not present

## 2015-09-22 DIAGNOSIS — I70209 Unspecified atherosclerosis of native arteries of extremities, unspecified extremity: Secondary | ICD-10-CM | POA: Insufficient documentation

## 2015-09-22 DIAGNOSIS — I509 Heart failure, unspecified: Secondary | ICD-10-CM | POA: Insufficient documentation

## 2015-09-22 DIAGNOSIS — Z8701 Personal history of pneumonia (recurrent): Secondary | ICD-10-CM | POA: Insufficient documentation

## 2015-09-22 DIAGNOSIS — L03115 Cellulitis of right lower limb: Secondary | ICD-10-CM | POA: Diagnosis not present

## 2015-09-22 DIAGNOSIS — L89109 Pressure ulcer of unspecified part of back, unspecified stage: Secondary | ICD-10-CM | POA: Diagnosis present

## 2015-09-22 DIAGNOSIS — E876 Hypokalemia: Secondary | ICD-10-CM | POA: Diagnosis present

## 2015-09-22 LAB — I-STAT TROPONIN, ED: Troponin i, poc: 0 ng/mL (ref 0.00–0.08)

## 2015-09-22 LAB — CBC
HCT: 36.9 % — ABNORMAL LOW (ref 39.0–52.0)
Hemoglobin: 11.4 g/dL — ABNORMAL LOW (ref 13.0–17.0)
MCH: 28.7 pg (ref 26.0–34.0)
MCHC: 30.9 g/dL (ref 30.0–36.0)
MCV: 92.9 fL (ref 78.0–100.0)
PLATELETS: 332 10*3/uL (ref 150–400)
RBC: 3.97 MIL/uL — AB (ref 4.22–5.81)
RDW: 16 % — AB (ref 11.5–15.5)
WBC: 8.8 10*3/uL (ref 4.0–10.5)

## 2015-09-22 LAB — COMPREHENSIVE METABOLIC PANEL
ALBUMIN: 1.8 g/dL — AB (ref 3.5–5.0)
ALT: 16 U/L — AB (ref 17–63)
AST: 28 U/L (ref 15–41)
Alkaline Phosphatase: 131 U/L — ABNORMAL HIGH (ref 38–126)
Anion gap: 16 — ABNORMAL HIGH (ref 5–15)
BILIRUBIN TOTAL: 0.7 mg/dL (ref 0.3–1.2)
BUN: 56 mg/dL — AB (ref 6–20)
CHLORIDE: 96 mmol/L — AB (ref 101–111)
CO2: 24 mmol/L (ref 22–32)
Calcium: 8 mg/dL — ABNORMAL LOW (ref 8.9–10.3)
Creatinine, Ser: 6.96 mg/dL — ABNORMAL HIGH (ref 0.61–1.24)
GFR calc Af Amer: 8 mL/min — ABNORMAL LOW (ref >60)
GFR calc non Af Amer: 7 mL/min — ABNORMAL LOW (ref >60)
GLUCOSE: 90 mg/dL (ref 65–99)
POTASSIUM: 3 mmol/L — AB (ref 3.5–5.1)
Sodium: 136 mmol/L (ref 135–145)
TOTAL PROTEIN: 5.9 g/dL — AB (ref 6.5–8.1)

## 2015-09-22 LAB — TSH: TSH: 1.566 u[IU]/mL (ref 0.350–4.500)

## 2015-09-22 LAB — CBC WITH DIFFERENTIAL/PLATELET
BASOS ABS: 0 10*3/uL (ref 0.0–0.1)
BASOS PCT: 0 %
EOS ABS: 0.2 10*3/uL (ref 0.0–0.7)
EOS PCT: 2 %
HEMATOCRIT: 36 % — AB (ref 39.0–52.0)
Hemoglobin: 11.2 g/dL — ABNORMAL LOW (ref 13.0–17.0)
LYMPHS PCT: 7 %
Lymphs Abs: 0.7 10*3/uL (ref 0.7–4.0)
MCH: 28.9 pg (ref 26.0–34.0)
MCHC: 31.1 g/dL (ref 30.0–36.0)
MCV: 92.8 fL (ref 78.0–100.0)
MONO ABS: 0.7 10*3/uL (ref 0.1–1.0)
Monocytes Relative: 7 %
Neutro Abs: 7.9 10*3/uL — ABNORMAL HIGH (ref 1.7–7.7)
Neutrophils Relative %: 84 %
PLATELETS: 286 10*3/uL (ref 150–400)
RBC: 3.88 MIL/uL — ABNORMAL LOW (ref 4.22–5.81)
RDW: 16 % — AB (ref 11.5–15.5)
WBC: 9.5 10*3/uL (ref 4.0–10.5)

## 2015-09-22 LAB — MRSA PCR SCREENING: MRSA by PCR: NEGATIVE

## 2015-09-22 LAB — CBG MONITORING, ED
GLUCOSE-CAPILLARY: 80 mg/dL (ref 65–99)
Glucose-Capillary: 84 mg/dL (ref 65–99)
Glucose-Capillary: 71 mg/dL (ref 65–99)
Glucose-Capillary: 76 mg/dL (ref 65–99)

## 2015-09-22 LAB — PROCALCITONIN: PROCALCITONIN: 1.1 ng/mL

## 2015-09-22 LAB — CREATININE, SERUM
CREATININE: 6.83 mg/dL — AB (ref 0.61–1.24)
GFR calc Af Amer: 8 mL/min — ABNORMAL LOW (ref >60)
GFR calc non Af Amer: 7 mL/min — ABNORMAL LOW (ref >60)

## 2015-09-22 LAB — GLUCOSE, CAPILLARY: Glucose-Capillary: 78 mg/dL (ref 65–99)

## 2015-09-22 LAB — I-STAT CG4 LACTIC ACID, ED: Lactic Acid, Venous: 1.51 mmol/L (ref 0.5–2.0)

## 2015-09-22 LAB — T4, FREE: Free T4: 1.09 ng/dL (ref 0.61–1.12)

## 2015-09-22 MED ORDER — DELFLEX-LC/1.5% DEXTROSE 346 MOSM/L IP SOLN
Freq: Four times a day (QID) | INTRAPERITONEAL | Status: DC
  Administered 2015-09-22: 3000 mL via INTRAPERITONEAL
  Administered 2015-09-23 (×2): via INTRAPERITONEAL
  Administered 2015-09-23 – 2015-09-24 (×3): 3000 mL via INTRAPERITONEAL

## 2015-09-22 MED ORDER — SENNOSIDES-DOCUSATE SODIUM 8.6-50 MG PO TABS: 1.0000 | ORAL_TABLET | Freq: Every evening | ORAL | Status: DC | PRN

## 2015-09-22 MED ORDER — PIPERACILLIN-TAZOBACTAM 3.375 G IVPB 30 MIN
3.3750 g | Freq: Once | INTRAVENOUS | Status: AC
  Administered 2015-09-22: 3.375 g via INTRAVENOUS
  Filled 2015-09-22: qty 50

## 2015-09-22 MED ORDER — HEPARIN SODIUM (PORCINE) 5000 UNIT/ML IJ SOLN
5000.0000 [IU] | Freq: Three times a day (TID) | INTRAMUSCULAR | Status: DC
  Administered 2015-09-23 – 2015-09-24 (×2): 5000 [IU] via SUBCUTANEOUS
  Filled 2015-09-22 (×2): qty 1

## 2015-09-22 MED ORDER — NEPRO/CARBSTEADY PO LIQD
237.0000 mL | Freq: Three times a day (TID) | ORAL | Status: DC
  Administered 2015-09-23 – 2015-09-24 (×3): 237 mL via ORAL

## 2015-09-22 MED ORDER — CALCIUM CARBONATE ANTACID 500 MG PO CHEW
2.0000 | CHEWABLE_TABLET | Freq: Three times a day (TID) | ORAL | Status: DC
  Administered 2015-09-23 – 2015-09-24 (×4): 400 mg via ORAL
  Filled 2015-09-22 (×5): qty 2

## 2015-09-22 MED ORDER — SODIUM CHLORIDE 0.9 % IV SOLN
Freq: Once | INTRAVENOUS | Status: AC
  Administered 2015-09-22: 250 mL via INTRAVENOUS

## 2015-09-22 MED ORDER — ONDANSETRON HCL 4 MG PO TABS: 4.0000 mg | ORAL_TABLET | Freq: Four times a day (QID) | ORAL | Status: DC | PRN

## 2015-09-22 MED ORDER — ASPIRIN EC 325 MG PO TBEC
325.0000 mg | DELAYED_RELEASE_TABLET | Freq: Every day | ORAL | Status: DC
  Administered 2015-09-23 – 2015-09-24 (×2): 325 mg via ORAL
  Filled 2015-09-22 (×2): qty 1

## 2015-09-22 MED ORDER — POTASSIUM CHLORIDE CRYS ER 20 MEQ PO TBCR
20.0000 meq | EXTENDED_RELEASE_TABLET | Freq: Two times a day (BID) | ORAL | Status: AC
  Administered 2015-09-23: 20 meq via ORAL
  Filled 2015-09-22 (×2): qty 1

## 2015-09-22 MED ORDER — ALLOPURINOL 100 MG PO TABS
100.0000 mg | ORAL_TABLET | Freq: Every day | ORAL | Status: DC
  Administered 2015-09-23 – 2015-09-24 (×2): 100 mg via ORAL
  Filled 2015-09-22 (×2): qty 1

## 2015-09-22 MED ORDER — RENA-VITE PO TABS
1.0000 | ORAL_TABLET | Freq: Every day | ORAL | Status: DC
  Administered 2015-09-23: 1 via ORAL
  Filled 2015-09-22: qty 1

## 2015-09-22 MED ORDER — VANCOMYCIN HCL 10 G IV SOLR
2500.0000 mg | Freq: Once | INTRAVENOUS | Status: AC
  Administered 2015-09-22: 2500 mg via INTRAVENOUS
  Filled 2015-09-22: qty 2500

## 2015-09-22 MED ORDER — HEPARIN 1000 UNIT/ML FOR PERITONEAL DIALYSIS: 500.0000 [IU] | INTRAMUSCULAR | Status: DC | PRN

## 2015-09-22 MED ORDER — SODIUM CHLORIDE 0.9 % IV SOLN
INTRAVENOUS | Status: DC
  Administered 2015-09-22 – 2015-09-24 (×2): via INTRAVENOUS

## 2015-09-22 MED ORDER — ONDANSETRON HCL 4 MG/2ML IJ SOLN: 4.0000 mg | Freq: Four times a day (QID) | INTRAMUSCULAR | Status: DC | PRN

## 2015-09-22 MED ORDER — MUPIROCIN CALCIUM 2 % EX CREA
TOPICAL_CREAM | Freq: Two times a day (BID) | CUTANEOUS | Status: DC
  Administered 2015-09-22 – 2015-09-23 (×2): via TOPICAL
  Filled 2015-09-22: qty 15

## 2015-09-22 MED ORDER — LEVOTHYROXINE SODIUM 112 MCG PO TABS
112.0000 ug | ORAL_TABLET | Freq: Every day | ORAL | Status: DC
  Administered 2015-09-23 – 2015-09-24 (×2): 112 ug via ORAL
  Filled 2015-09-22 (×2): qty 1

## 2015-09-22 MED ORDER — HEPARIN 1000 UNIT/ML FOR PERITONEAL DIALYSIS
1500.0000 [IU] | INTRAMUSCULAR | Status: DC | PRN
  Administered 2015-09-22 – 2015-09-23 (×2): 1500 [IU] via INTRAPERITONEAL
  Filled 2015-09-22 (×3): qty 1.5

## 2015-09-22 MED ORDER — PIPERACILLIN-TAZOBACTAM IN DEX 2-0.25 GM/50ML IV SOLN
2.2500 g | Freq: Three times a day (TID) | INTRAVENOUS | Status: DC
  Filled 2015-09-22 (×2): qty 50

## 2015-09-22 MED ORDER — VANCOMYCIN HCL IN DEXTROSE 1-5 GM/200ML-% IV SOLN
1000.0000 mg | Freq: Once | INTRAVENOUS | Status: DC
  Filled 2015-09-22: qty 200

## 2015-09-22 MED ORDER — ACETAMINOPHEN 650 MG RE SUPP: 650.0000 mg | Freq: Four times a day (QID) | RECTAL | Status: DC | PRN

## 2015-09-22 MED ORDER — MORPHINE SULFATE (PF) 2 MG/ML IV SOLN: 1.0000 mg | INTRAVENOUS | Status: DC | PRN

## 2015-09-22 MED ORDER — INSULIN ASPART 100 UNIT/ML ~~LOC~~ SOLN
0.0000 [IU] | Freq: Three times a day (TID) | SUBCUTANEOUS | Status: DC
  Administered 2015-09-23: 5 [IU] via SUBCUTANEOUS
  Administered 2015-09-24: 2 [IU] via SUBCUTANEOUS

## 2015-09-22 MED ORDER — TRAZODONE HCL 50 MG PO TABS: 25.0000 mg | ORAL_TABLET | Freq: Every evening | ORAL | Status: DC | PRN

## 2015-09-22 MED ORDER — SODIUM CHLORIDE 0.9% FLUSH
3.0000 mL | Freq: Two times a day (BID) | INTRAVENOUS | Status: DC
  Administered 2015-09-22: 3 mL via INTRAVENOUS

## 2015-09-22 MED ORDER — NITROGLYCERIN 0.2 MG/HR TD PT24
0.2000 mg | MEDICATED_PATCH | Freq: Every day | TRANSDERMAL | Status: DC
  Administered 2015-09-22 – 2015-09-24 (×3): 0.2 mg via TRANSDERMAL
  Filled 2015-09-22 (×3): qty 1

## 2015-09-22 MED ORDER — MIDODRINE HCL 5 MG PO TABS
10.0000 mg | ORAL_TABLET | Freq: Three times a day (TID) | ORAL | Status: DC
  Administered 2015-09-22 – 2015-09-24 (×5): 10 mg via ORAL
  Filled 2015-09-22 (×5): qty 2

## 2015-09-22 MED ORDER — ACETAMINOPHEN 325 MG PO TABS: 650.0000 mg | ORAL_TABLET | Freq: Four times a day (QID) | ORAL | Status: DC | PRN

## 2015-09-22 MED ORDER — AMOXICILLIN-POT CLAVULANATE 875-125 MG PO TABS: 1.0000 | ORAL_TABLET | Freq: Two times a day (BID) | ORAL | Status: DC

## 2015-09-22 MED ORDER — OXYCODONE-ACETAMINOPHEN 5-325 MG PO TABS: 1.0000 | ORAL_TABLET | Freq: Four times a day (QID) | ORAL | Status: DC | PRN

## 2015-09-22 NOTE — ED Notes (Signed)
CBG 76   Abigail PA aware

## 2015-09-22 NOTE — ED Notes (Addendum)
EMS reports pt had low blood sugar this morning of 33. Pt was given 1 amp of d50. Pt's bcg was then 233 then dropped to 150. Pt is alert at this time

## 2015-09-22 NOTE — ED Provider Notes (Signed)
Patient noted to be hypoglycemic this morning, he was reportedly less responsive. He was treated by EMS with D50 intravenously, is presently asymptomatic and alert. He denies skipping any meals or recent changes in insulin or medication regimen  Jack Dakin, MD 09/22/15 1650

## 2015-09-22 NOTE — H&P (Signed)
[facility] History and Physical  Jack Chung [id number] DOB: 1940-11-13 DOA: 09/22/2015  Referring physician:  PCP: Jack Neer, MD   Chief Complaint: Symptomatic Hypoglycemia  HPI: Jack Chung is a 75 y.o. male with a history of PVD, IDDM, CHF, CAd, sarcoidosis, recent hospitalization in 06/2015 for LBKA, ESRD on peritoneal dialysis q 4 hrs brought by wife due to AMS around 4 am. Wife checked his blood sugars at home, were at 30, while prior evening it was 240. The patient had taken Lantus at 10 PM, but did noted to take his Novolog last night. Wife reports that he had similar episodes in the recent months, when due to the low blood sugar he becomes lethargic. EMS gave patient gave 1 amp D50 with BS rising to 223_>150_>86, given Orange juice at the ER but his sugars  Continue to drop to 76. His BP was normal but he was hypothermic with Temp 92.8 rectal, POX normal.  He denies any changes in other meds, no pain in his L stump and he reports improving circulation on his right foot after Nitro patch placement, as he is trying to avoid RLE amputation. He reports that he is healing well in both the stump and the right foot.  Denies fevers, chills, night sweats, vision changes, or mucositis. Denies any respiratory complaints. Denies any chest pain or palpitations. Denies nausea, heartburn. Had some diarrhea last evening which self resolved  Denies abdominal pain. Peritoneal dialysis functioning well. Appetite is normal.  Denies any bleeding issues such as epistaxis, hematemesis or hematochezia At the ED, he was not toxic appearing, calm and comfortable, not tachycardic. CXR negative. Right foot shows soft tissues about the right great toe appear swollen without underlying bony or joint abnormality. Procalcitonin pending. Lactic acid 1.5. WBC normal. Current VS BP 112/57 mmHg  Pulse 65  Temp(Src) 97.8 F (36.6 C) (Oral)  Resp 15  Ht 5\' 9"  (1.753 m)  Wt 118 kg (260 lb 2.3 oz)  BMI 38.40  kg/m2  SpO2 95%  He will be admitted for the management of hypoglycemia, RLE cellulitis, ESRD peritoneal dialysisi   Review of Systems:   See HPI for significant positives. All other systems were reviewed and are negative.  Past Medical History  Diagnosis Date  . Hypertension   . Hypothyroidism   . CHF (congestive heart failure) ([place])   . Anemia   . Blind left eye   . Sleep apnea     not on CPAP  . Coronary artery disease     CABG x 11 Jun 2009.  MRSA infections of incsions  . Arthritis   . Sarcoidosis ([place])     Hx:of  . Diabetic retinopathy ([place])     Hx: of bilateral  . Edema   . Hyperlipidemia   . 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 Atherosclerosis of native arteries of the extremities, unspecified   . Eczema     Hx: of  . Insomnia   . Seasonal allergies   . History of blood transfusion     with heart surgery  . Coronary atherosclerosis of native coronary artery   . Atherosclerosis of native arteries of the extremities, unspecified   . Pneumonia   . Diabetes mellitus     type 2  . ESRD on dialysis [facility])     peritoneal dialysis - 5 times a time 9a, 12n, 3p, 6p, 9p  . GERD (gastroesophageal reflux disease)   . Neuropathy ([place])   .  Cancer ([place])     skin cancer - melanoma on back   Past Surgical History  Procedure Laterality Date  . Cardiac surgery      total of 6 surgeries, 5 related to mrsa  . Coronary artery bypass graft  06/2009  . Cataract surgery      Hx: of  . Debridements      Hx: of secondary to MRSA  . Av fistula placement Right 10/11/2012    Procedure: ARTERIOVENOUS (AV) FISTULA CREATION;  Surgeon: Rosetta Posner, MD;  Location: [place];  Service: Vascular;  Laterality: Right;  . Insertion of dialysis catheter Left   . Capd insertion N/A 11/28/2012    Procedure: LAPAROSCOPIC PERITONEAL DIALYSIS CATHETER PLACEMENT;  Surgeon: Ralene Ok, MD;  Location: [place];  Service: General;  Laterality: N/A;  . Left heart  catheterization with coronary/graft angiogram N/A 01/31/2014    Procedure: LEFT HEART CATHETERIZATION WITH Beatrix Fetters;  Surgeon: Troy Sine, MD;  Location[facility] CATH LAB;  Service: Cardiovascular;  Laterality: N/A;  . Peripheral vascular catheterization N/A 04/28/2015    Procedure: Abdominal Aortogram w/Lower Extremity;  Surgeon: Conrad Cave, MD;  Location: [place] CV LAB;  Service: Cardiovascular;  Laterality: N/A;  . Amputation Left 04/30/2015    Procedure: 1st Ray Amputation Left Foot;  Surgeon: Newt Minion, MD;  Location: [place];  Service: Orthopedics;  Laterality: Left;  . Esophagogastroduodenoscopy N/A 05/20/2015    Procedure: ESOPHAGOGASTRODUODENOSCOPY (EGD);  Surgeon: Wilford Corner, MD;  Location: [facility] ENDOSCOPY;  Service: Endoscopy;  Laterality: N/A;  Possible esophageal dilatation  . Eye surgery Bilateral     cataract surgery with lens implants  . Amputation Left 06/13/2015    Procedure: Left Below Knee Amputation;  Surgeon: Newt Minion, MD;  Location: [place];  Service: Orthopedics;  Laterality: Left;   Social History:  reports that he has never smoked. He has never used smokeless tobacco. He reports that he does not drink alcohol or use illicit drugs.  Allergies  Allergen Reactions  . Flu Virus Vaccine     Gets the flu, patient has bad reaction to the flu vaccine.  . Lipitor [Atorvastatin]     Leg cramps    Family History  Problem Relation Age of Onset  . COPD Mother   . COPD Father      Prior to Admission medications   Medication Sig Start Date End Date Taking? Authorizing Provider  allopurinol (ZYLOPRIM) 100 MG tablet Take 100 mg by mouth daily.   Yes Historical Provider, MD  aspirin 325 MG EC tablet Take 325 mg by mouth daily.   Yes Historical Provider, MD  calcium carbonate (TUMS - DOSED IN MG ELEMENTAL CALCIUM) 500 MG chewable tablet Chew 2 tablets by mouth 3 (three) times daily with meals.    Yes Historical Provider, MD  cetirizine (ZYRTEC) 10  MG tablet Take 10 mg by mouth daily as needed for allergies.    Yes Historical Provider, MD  gentamicin cream (GARAMYCIN) 0.1 % APPLY TO THE AFFECTED AREAS AS DIRECTED. 06/19/15  Yes Historical Provider, MD  insulin aspart (NOVOLOG) 100 UNIT/ML injection Inject 10 Units into the skin 3 (three) times daily with meals. To give 10 units plus SSI -Take none if blood sugar < 150, 151-200 2 units, 201-250 4 units, 251-300 6 units, 301-350 8 units   Yes Historical Provider, MD  Insulin Glargine (LANTUS) 100 UNIT/ML Solostar Pen Inject 20 units into the skin daily. Dx 250.00 06/21/15  Yes Tonna Corner  Marlou Sa, MD  Lactobacillus (ACIDOPHILUS PROBIOTIC PO) Take 2 tablets by mouth daily.   Yes Historical Provider, MD  lactulose (CHRONULAC) 10 GM/15ML solution Take 10 g by mouth every 4 (four) hours as needed for mild constipation.   Yes Historical Provider, MD  levothyroxine (SYNTHROID, LEVOTHROID) 112 MCG tablet LABS/APPOINTMENT OVERDUE, 1 by mouth daily in the morning 30 minutes before breakfast for Thyroid 12/10/13  Yes Tiffany L Reed, DO  loperamide (IMODIUM) 2 MG capsule Take 2 mg by mouth daily as needed for diarrhea or loose stools.   Yes Historical Provider, MD  metoCLOPramide (REGLAN) 5 MG tablet Take 2 tablets (10 mg total) by mouth 3 (three) times daily before meals. 05/02/15  Yes Costin Karlyne Greenspan, MD  midodrine (PROAMATINE) 10 MG tablet Take 1 tablet (10 mg total) by mouth 3 (three) times daily. Patient taking differently: Take 10 mg by mouth every morning.  03/11/15  Yes Pixie Casino, MD  multivitamin (RENA-VIT) TABS tablet Take 1 tablet by mouth daily.   Yes Historical Provider, MD  nitroGLYCERIN (NITRODUR - DOSED IN MG/24 HR) 0.2 mg/hr patch Place 0.2 mg onto the skin daily.  05/14/15  Yes Historical Provider, MD  Nutritional Supplements (FEEDING SUPPLEMENT, NEPRO CARB STEADY,) LIQD Drink 1 can by mouth three times a day between meals   Yes Historical Provider, MD  omeprazole (PRILOSEC) 10 MG capsule  Take 10 mg by mouth daily. Reported on 08/01/2015   Yes Historical Provider, MD  oxyCODONE-acetaminophen (PERCOCET/ROXICET) 5-325 MG tablet Take one tablet by mouth every 6 hours as needed for moderate pain 06/23/15  Yes Tiffany L Reed, DO  potassium chloride SA (K-DUR,KLOR-CON) 20 MEQ tablet Take 20 mEq by mouth daily.   Yes Historical Provider, MD   Physical Exam: Filed Vitals:   09/22/15 1456 09/22/15 1500 09/22/15 1515 09/22/15 1530  BP:  121/63 120/60 112/57  Pulse:  65 68 65  Temp:    97.8 F (36.6 C)  TempSrc:    Oral  Resp:  18 13 15   Height:      Weight:      SpO2: 98% 98% 99% 95%    Wt Readings from Last 3 Encounters:  09/22/15 118 kg (260 lb 2.3 oz)  09/05/15 105.688 kg (233 lb)  08/15/15 102.513 kg (226 lb)    General: Appears calm and comfortable Eyes:  PERRL, EOMI, normal lids, iris ENT: grossly normal hearing, lips & tongue Neck: no lymphadenopathy, masses or thyromegaly Cardiovascular: regular rate and rythm, no murmurs, rubs or gallops. No right lower extremity edema , LBKA Respiratory: clear to auscultation bilaterally, no wheezing, rhonhci or rales. Normal respiratory effort. Abdomen: soft,non-tender, normal bowel sounds. Peritoneal dialysis site ok Skin: RLE with dry eschars, no open lesions, no erythema, no other areas of infection. LBKA site healing well Musculoskeletal:  grossly normal tone in both upper and R lower extremity Psychiatric: grossly normal mood and affect, speech fluent and appropriate Neurologic: CN 2-12 grossly intact, moves  extremities in coordinated fashion.          Labs on Admission:  Basic Metabolic Panel:  Recent Labs Lab 09/22/15 1157  NA 136  K 3.0*  CL 96*  CO2 24  GLUCOSE 90  BUN 56*  CREATININE 6.96*  CALCIUM 8.0*    Liver Function Tests:  Recent Labs Lab 09/22/15 1157  AST 28  ALT 16*  ALKPHOS 131*  BILITOT 0.7  PROT 5.9*  ALBUMIN 1.8*   No results for input(s): LIPASE, AMYLASE in the last  168  hours. No results for input(s): AMMONIA in the last 168 hours.  CBC:  Recent Labs Lab 09/22/15 1157  WBC 9.5  NEUTROABS 7.9*  HGB 11.2*  HCT 36.0*  MCV 92.8  PLT 286    Cardiac Enzymes: No results for input(s): CKTOTAL, CKMB, CKMBINDEX, TROPONINI in the last 168 hours.  BNP (last 3 results)  Recent Labs  05/18/15 1139  BNP 434.4*    ProBNP (last 3 results) No results for input(s): PROBNP in the last 8760 hours.   CREATININE: 6.96 mg/dL ABNORMAL (09/22/15 1157) Estimated creatinine clearance - 12.2 mL/min  CBG:  Recent Labs Lab 09/22/15 1153 09/22/15 1305 09/22/15 1418 09/22/15 1550  GLUCAP 84 71 76 80    Radiological Exams on Admission: Dg Ankle Complete Right  09/22/2015  CLINICAL DATA:  Diabetic patient with sores about the right foot and ankle. Pain. Initial encounter. EXAM: RIGHT ANKLE - COMPLETE 3+ VIEW COMPARISON:  None. FINDINGS: No acute bony or joint abnormality is identified. No radiopaque foreign body or soft tissue gas collection is seen. No bony destructive change or periostitis is identified. Dorsal calcaneal spur is noted and extensive atherosclerosis are noted. IMPRESSION: No acute abnormality. Extensive atherosclerosis. Electronically Signed   By: Inge Rise M.D.   On: 09/22/2015 13:39   Dg Chest Port 1 View  09/22/2015  CLINICAL DATA:  Hypoglycemia EXAM: PORTABLE CHEST 1 VIEW COMPARISON:  05/18/2015 FINDINGS: Cardiac shadow remains enlarged. The lungs are well aerated bilaterally. No focal infiltrate or sizable effusion is seen. Postoperative changes at the medial left lung base are again noted. IMPRESSION: No acute abnormality noted. Electronically Signed   By: Inez Catalina M.D.   On: 09/22/2015 12:42   Dg Foot Complete Right  09/22/2015  CLINICAL DATA:  Diabetic patient with wounds on the right foot. Swollen and red great toe. Initial encounter. EXAM: RIGHT FOOT COMPLETE - 3+ VIEW COMPARISON:  None. FINDINGS: No acute bony or joint  abnormality is identified. No bony destructive change or periosteal reaction is seen. No radiopaque foreign body or soft tissue gas collection is identified. Soft tissues about the great toe appear swollen. Extensive atherosclerosis is noted. IMPRESSION: Soft tissues about the right great toe appear swollen without underlying bony or joint abnormality. Atherosclerosis. Electronically Signed   By: Inge Rise M.D.   On: 09/22/2015 13:37    EKG: Independently reviewed.    Assessment/Plan Principal Problem:   Hypoglycemia Active Problems:   Anemia   CHF (congestive heart failure) (HCC)   Below knee amputation status (HCC)   Type 2 diabetes mellitus with polyneuropathy (H[facility])   End-stage renal disease on hemodialysis (HCC)   Cellulitis   Acute encephalopathy in the setting of Hypoglycemia in a patient with DM on Insulin and RLE cellulitis with a BS on arrival at 30, treated with D5 with resolution of confusion. Last blood sugars 76, new values requested.. A1C 04/2015 8.7 He will be admitted to tele obs for the management of hypoglycemia  -stop Lantus tonight SSI -diabetic diet -d5 if blood sugar drops  IV Fluids at 100 cc/h   Cellulitis of the RLE. Patient s/p BKA Jan 2017, RLE being treated as outpatient to avoid amputation. Initial tem 92.8, now normal after hypoglycemia corrected Osats in RA. Low suspicion for sepsis. Procalcitonin pending. Lactic acid 1.5 .WBC at 9.5 Receive IV Vanco and Zosyn x1 in the ER after cultures obtained. Foot and Ankle XR negative for osteomyelitis. Recommended oral antibiotics with Augmentin but family deferred due to patients  prior experience with antibiotics. They agreed to uses Bactroban topically and to  continue Nitro patch for improved perfusion IVF as above UA with culture Blood culture pending   ESRD stage 5 on peritoneal dialysis every 4 hrs. Cr. 6.96 Dr. Melvia Heaps has been notified of the patient's admission as he will need dialysis while here.,  plans as per Renal  Deconditioning  PT/OT   [facility], Last 2 D echo in 01/2015 with Hypokinesis of the inferior basal wall with overall preserved LV  function; mild LVH; grade 1 diastolic dysfunction, EF 0000000.  No cardiac issues at this time.   Hypothyroidism: -Continue home Synthroid  Hypokalemia in the setting of ESRD on dialysis K 3.0, received replenishment by Renal Repeat in am Follow with Nephrology   Acute on chronic anemia related to CKD, infection. Hemoglobin11.2 on admission. No transfusion indicated at this time  CBC in am  Code Status: Full Code  DVT Prophylaxis: Heparin Family Communication:  Family at bedside Disposition Plan: Pending Improvement. Admitted for observation in tele bed. Expected LOS 24-48 hrs    [facility] E,PA-C [facility] [email or web address] Password [email or web address]

## 2015-09-22 NOTE — ED Notes (Signed)
X-ray at bedside

## 2015-09-22 NOTE — Progress Notes (Signed)
Admission note:  Arrival Method: ED stretcher  Mental Orientation: alert & oriented x 4  Telemetry: box #14 applied and CCMD notified  Assessment: in progress  Skin: great toe on left foot with a necrotic tip IV: left AC with normal saline at 100cc/mL  Pain: patient denies  Tubes: PD catheter  Safety Measures: Patient Handbook has been given, and discussed the Fall Prevention worksheet. Left at bedside  Admission: Completed and admission orders have been written  6E Orientation: Patient has been oriented to the unit, staff and to the room.  Family: At the [place], RN [facility] Phone [phone]

## 2015-09-22 NOTE — ED Provider Notes (Signed)
CSN: [id number]     Arrival date & time 09/22/15  1134 History   First MD Initiated Contact with Patient 09/22/15 1145     Chief Complaint  Patient presents with  . Hypoglycemia     (Consider location/radiation/quality/duration/timing/severity/associated sxs/prior Treatment)  PCP: Derrill Memo HPI   This is a 75 year old male who presents emergency Department with chief complaint of hypoglycemia. History is gathered by the patient and EMS. He has a past medical history peripheral vascular disease, diabetes, CHF, CAD, sarcoidosis, status post BKA of the left leg. He has end-stage renal disease and is on home peritoneal dialysis. He does not make any urine. This morning the patient became acutely confused, does have a blood sugar of 30 mg/dL. EMS was called. Patient was given 1 amp of D50 and, according to EMS report, his blood sugar shot up to 233, then dropped to 150 on within the right heel. Current CBG is at 86. Patient states he took his regular dose of insulin last night and ate a normal meal of chicken, potatoes, solid and are normal. He has not had any recent changes in his medications. He does not have any pain or complaints at this time. Patient states he wears a nitroglycerin patch on his right foot for poor circulation. He states that he does not want to lose the right leg. He is unable to ambulate since his previous BKA. He is under the care of [name] for his peripheral vascular disease  Past Medical History  Diagnosis Date  . Hypertension   . Hypothyroidism   . CHF (congestive heart failure) ([place])   . Anemia   . Blind left eye   . Sleep apnea     not on CPAP  . Coronary artery disease     CABG x 11 Jun 2009.  MRSA infections of incsions  . Arthritis   . Sarcoidosis ([place])     Hx:of  . Diabetic retinopathy ([place])     Hx: of bilateral  . Edema   . Hyperlipidemia   . 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 Atherosclerosis of native arteries of the  extremities, unspecified   . Eczema     Hx: of  . Insomnia   . Seasonal allergies   . History of blood transfusion     with heart surgery  . Coronary atherosclerosis of native coronary artery   . Atherosclerosis of native arteries of the extremities, unspecified   . Pneumonia   . Diabetes mellitus     type 2  . ESRD on dialysis [facility])     peritoneal dialysis - 5 times a time 9a, 12n, 3p, 6p, 9p  . GERD (gastroesophageal reflux disease)   . Neuropathy ([place])   . Cancer ([place])     skin cancer - melanoma on back   Past Surgical History  Procedure Laterality Date  . Cardiac surgery      total of 6 surgeries, 5 related to mrsa  . Coronary artery bypass graft  06/2009  . Cataract surgery      Hx: of  . Debridements      Hx: of secondary to MRSA  . Av fistula placement Right 10/11/2012    Procedure: ARTERIOVENOUS (AV) FISTULA CREATION;  Surgeon: Rosetta Posner, MD;  Location: [place];  Service: Vascular;  Laterality: Right;  . Insertion of dialysis catheter Left   . Capd insertion N/A 11/28/2012    Procedure: LAPAROSCOPIC PERITONEAL DIALYSIS CATHETER PLACEMENT;  Surgeon:  Ralene Ok, MD;  Location: [place];  Service: General;  Laterality: N/A;  . Left heart catheterization with coronary/graft angiogram N/A 01/31/2014    Procedure: LEFT HEART CATHETERIZATION WITH Beatrix Fetters;  Surgeon: Troy Sine, MD;  Location: [facility] CATH LAB;  Service: Cardiovascular;  Laterality: N/A;  . Peripheral vascular catheterization N/A 04/28/2015    Procedure: Abdominal Aortogram w/Lower Extremity;  Surgeon: Conrad [place], MD;  Location: [place] CV LAB;  Service: Cardiovascular;  Laterality: N/A;  . Amputation Left 04/30/2015    Procedure: 1st Ray Amputation Left Foot;  Surgeon: Newt Minion, MD;  Location: [place];  Service: Orthopedics;  Laterality: Left;  . Esophagogastroduodenoscopy N/A 05/20/2015    Procedure: ESOPHAGOGASTRODUODENOSCOPY (EGD);  Surgeon: Wilford Corner, MD;  Location: [facility]  ENDOSCOPY;  Service: Endoscopy;  Laterality: N/A;  Possible esophageal dilatation  . Eye surgery Bilateral     cataract surgery with lens implants  . Amputation Left 06/13/2015    Procedure: Left Below Knee Amputation;  Surgeon: Newt Minion, MD;  Location: [place];  Service: Orthopedics;  Laterality: Left;   Family History  Problem Relation Age of Onset  . COPD Mother   . COPD Father    Social History  Substance Use Topics  . Smoking status: Never Smoker   . Smokeless tobacco: Never Used  . Alcohol Use: No    Review of Systems  Ten systems reviewed and are negative for acute change, except as noted in the HPI.    Allergies  Flu virus vaccine and Lipitor  Home Medications   Prior to Admission medications   Medication Sig Start Date End Date Taking? Authorizing Provider  allopurinol (ZYLOPRIM) 100 MG tablet Take 100 mg by mouth daily.    Historical Provider, MD  aspirin 325 MG EC tablet Take 325 mg by mouth daily.    Historical Provider, MD  calcium carbonate (TUMS - DOSED IN MG ELEMENTAL CALCIUM) 500 MG chewable tablet Chew 2 tablets by mouth 3 (three) times daily with meals.     Historical Provider, MD  cetirizine (ZYRTEC) 10 MG tablet Take 10 mg by mouth as needed.     Historical Provider, MD  gentamicin cream (GARAMYCIN) 0.1 % APPLY TO THE AFFECTED AREAS AS DIRECTED. 06/19/15   Historical Provider, MD  insulin aspart (NOVOLOG) 100 UNIT/ML injection Inject 10 Units into the skin 3 (three) times daily with meals. To give 10 units plus SSI -Take none if blood sugar < 150, 151-200 2 units, 201-250 4 units, 251-300 6 units, 301-350 8 units    Historical Provider, MD  Insulin Glargine (LANTUS) 100 UNIT/ML Solostar Pen Inject 20 units into the skin daily. Dx 250.00 06/21/15   Meredith Pel, MD  Lactobacillus (ACIDOPHILUS PROBIOTIC PO) Take 2 tablets by mouth daily.    Historical Provider, MD  lactulose (CHRONULAC) 10 GM/15ML solution Take 10 g by mouth every 4 (four) hours as needed  for mild constipation.    Historical Provider, MD  levothyroxine (SYNTHROID, LEVOTHROID) 112 MCG tablet LABS/APPOINTMENT OVERDUE, 1 by mouth daily in the morning 30 minutes before breakfast for Thyroid 12/10/13   Tiffany L Reed, DO  loperamide (IMODIUM) 2 MG capsule Take 2 mg by mouth daily as needed for diarrhea or loose stools.    Historical Provider, MD  metoCLOPramide (REGLAN) 5 MG tablet Take 2 tablets (10 mg total) by mouth 3 (three) times daily before meals. 05/02/15   Costin Karlyne Greenspan, MD  midodrine (PROAMATINE) 10 MG tablet Take 1 tablet (  10 mg total) by mouth 3 (three) times daily. 03/11/15   Pixie Casino, MD  multivitamin (RENA-VIT) TABS tablet Take 1 tablet by mouth daily.    Historical Provider, MD  nitroGLYCERIN (NITRODUR - DOSED IN MG/24 HR) 0.2 mg/hr patch Place 0.2 mg onto the skin daily.  05/14/15   Historical Provider, MD  Nutritional Supplements (FEEDING SUPPLEMENT, NEPRO CARB STEADY,) LIQD Drink 1 can by mouth three times a day between meals    Historical Provider, MD  omeprazole (PRILOSEC) 10 MG capsule Take 10 mg by mouth daily. Reported on 08/01/2015    Historical Provider, MD  oxyCODONE-acetaminophen (PERCOCET/ROXICET) 5-325 MG tablet Take one tablet by mouth every 6 hours as needed for moderate pain 06/23/15   Tiffany L Reed, DO  potassium chloride SA (K-DUR,KLOR-CON) 20 MEQ tablet Take 20 mEq by mouth daily.    Historical Provider, MD   BP 118/57 mmHg  Pulse 61  Temp(Src) 92.8 F (33.8 C) (Rectal)  Resp 16  Ht 5\' 9"  (1.753 m)  Wt 118 kg  BMI 38.40 kg/m2  SpO2 98% Physical Exam  Constitutional: He appears well-developed and well-nourished. No distress.  HENT:  Head: Normocephalic and atraumatic.  Eyes: Conjunctivae are normal. No scleral icterus.  Neck: Normal range of motion. Neck supple.  Cardiovascular: Normal rate, regular rhythm and normal heart sounds.   Pulmonary/Chest: Effort normal and breath sounds normal. No respiratory distress.  Abdominal: Soft. There  is no tenderness.  Musculoskeletal: He exhibits no edema.  Neurological: He is alert.  Skin: Skin is warm and dry. He is not diaphoretic.  Psychiatric: His behavior is normal.  Nursing note and vitals reviewed.   ED Course  Procedures (including critical care time) Labs Review Labs Reviewed  CULTURE, BLOOD (ROUTINE X 2)  CULTURE, BLOOD (ROUTINE X 2)  COMPREHENSIVE METABOLIC PANEL  CBC WITH DIFFERENTIAL/PLATELET  PROCALCITONIN  CBG MONITORING, ED  I-STAT CG4 LACTIC ACID, ED  I-STAT TROPOININ, ED    Imaging Review No results found. I have personally reviewed and evaluated these images and lab results as part of my medical decision-making.   EKG Interpretation None      MDM   Final diagnoses:  Hypoglycemia  Hypothermia, initial encounter  Cellulitis of right lower extremity    Patient with hypoglycemia, it appears to have been refractory to treatment with D50, or orange juice and a meal. For the patient will need admission for observation for his hypoglycemia. His temperature has increased with bear hugger. He does not appear to be in sepsis. X-ray of the leg is negative. We'll treat for cellulitis today. Considering erythema. I was able to Doppler a pulse in the foot. The patient will be admitted. He is reluctant, but I feel this is the safest thing for the patient and this time and he states that he is willing to come in for 24 hours. I spoken triad hospitalists who will admit the patient.      Margarita Mail, PA-C 09/22/15 2054  Orlie Dakin, MD 09/24/15 [phone]

## 2015-09-22 NOTE — ED Notes (Signed)
Orange juice given to pt and meal tray ordered.

## 2015-09-22 NOTE — ED Notes (Signed)
CBG: 80 

## 2015-09-22 NOTE — Progress Notes (Signed)
Pharmacy Antibiotic Note  Jack Chung is a 75 y.o. male admitted on 09/22/2015 with cellulitis.  Pharmacy has been consulted for vancomycin and zosyn dosing. Pt is hypothermic and labs are pending. Pt with history of ESRD and is on PD at home.   Plan: - Vancomycin 2500mg  IV x 1 - will plan to check a level in 3-4 days to determine maintenance dose - Zosyn 3.375gm IV x 1 then 2.25gm IV Q8H - F/u renal fxn, C&S, clinical status and vanc levels as appropriate  Height: 5\' 9"  (175.3 cm) Weight: 260 lb 2.3 oz (118 kg) IBW/kg (Calculated) : 70.7  Temp (24hrs), Avg:92.8 F (33.8 C), Min:92.8 F (33.8 C), Max:92.8 F (33.8 C)  No results for input(s): WBC, CREATININE, LATICACIDVEN, VANCOTROUGH, VANCOPEAK, VANCORANDOM, GENTTROUGH, GENTPEAK, GENTRANDOM, TOBRATROUGH, TOBRAPEAK, TOBRARND, AMIKACINPEAK, AMIKACINTROU, AMIKACIN in the last 168 hours.  CrCl cannot be calculated (Patient has no serum creatinine result on file.).    Allergies  Allergen Reactions  . Flu Virus Vaccine     Gets the flu, patient has bad reaction to the flu vaccine.  . Lipitor [Atorvastatin]     Leg cramps    Antimicrobials this admission: Vanc 4/17>> Zosyn 4/17>>  Dose adjustments this admission: N/A  Microbiology results: Pending  Thank you for allowing pharmacy to be a part of this patient's care.  Jb Dulworth, Rande Lawman 09/22/2015 12:26 PM

## 2015-09-22 NOTE — Progress Notes (Signed)
Pt with ESRD on CAPD 4 exchanges with daytime fill 3 L nightime 2.5 L.  Admitted OBS status with hypoglycemia. BP variable. K 3 - give 20 KCl bid x 2 doses and recheck am labs. CXR clear - use 1.5s tonight.  If pt is admitted inpatient status, full consult will be done.  Amalia Hailey, PA-C  Pt seen, examined and agree w A/P as above.  [name] MD [employer] pager [phone]    cell [phone] 09/23/2015, 3:05 PM

## 2015-09-23 DIAGNOSIS — T68XXXD Hypothermia, subsequent encounter: Secondary | ICD-10-CM

## 2015-09-23 DIAGNOSIS — Z89512 Acquired absence of left leg below knee: Secondary | ICD-10-CM | POA: Diagnosis not present

## 2015-09-23 DIAGNOSIS — Z992 Dependence on renal dialysis: Secondary | ICD-10-CM

## 2015-09-23 DIAGNOSIS — L03115 Cellulitis of right lower limb: Secondary | ICD-10-CM

## 2015-09-23 DIAGNOSIS — N186 End stage renal disease: Secondary | ICD-10-CM

## 2015-09-23 DIAGNOSIS — E162 Hypoglycemia, unspecified: Secondary | ICD-10-CM

## 2015-09-23 LAB — COMPREHENSIVE METABOLIC PANEL
ALBUMIN: 1.5 g/dL — AB (ref 3.5–5.0)
ALK PHOS: 109 U/L (ref 38–126)
ALT: 14 U/L — ABNORMAL LOW (ref 17–63)
ANION GAP: 14 (ref 5–15)
AST: 22 U/L (ref 15–41)
BILIRUBIN TOTAL: 0.9 mg/dL (ref 0.3–1.2)
BUN: 52 mg/dL — AB (ref 6–20)
CALCIUM: 7.5 mg/dL — AB (ref 8.9–10.3)
CO2: 24 mmol/L (ref 22–32)
Chloride: 98 mmol/L — ABNORMAL LOW (ref 101–111)
Creatinine, Ser: 6.4 mg/dL — ABNORMAL HIGH (ref 0.61–1.24)
GFR calc Af Amer: 9 mL/min — ABNORMAL LOW (ref >60)
GFR calc non Af Amer: 8 mL/min — ABNORMAL LOW (ref >60)
GLUCOSE: 59 mg/dL — AB (ref 65–99)
Potassium: 3 mmol/L — ABNORMAL LOW (ref 3.5–5.1)
Sodium: 136 mmol/L (ref 135–145)
TOTAL PROTEIN: 5.4 g/dL — AB (ref 6.5–8.1)

## 2015-09-23 LAB — CBC
HCT: 32.8 % — ABNORMAL LOW (ref 39.0–52.0)
HEMOGLOBIN: 10.1 g/dL — AB (ref 13.0–17.0)
MCH: 28.4 pg (ref 26.0–34.0)
MCHC: 30.8 g/dL (ref 30.0–36.0)
MCV: 92.1 fL (ref 78.0–100.0)
Platelets: 334 10*3/uL (ref 150–400)
RBC: 3.56 MIL/uL — ABNORMAL LOW (ref 4.22–5.81)
RDW: 15.9 % — ABNORMAL HIGH (ref 11.5–15.5)
WBC: 9.5 10*3/uL (ref 4.0–10.5)

## 2015-09-23 LAB — GLUCOSE, CAPILLARY
GLUCOSE-CAPILLARY: 109 mg/dL — AB (ref 65–99)
GLUCOSE-CAPILLARY: 252 mg/dL — AB (ref 65–99)
Glucose-Capillary: 83 mg/dL (ref 65–99)
Glucose-Capillary: 32 mg/dL — CL (ref 65–99)
Glucose-Capillary: 85 mg/dL (ref 65–99)

## 2015-09-23 LAB — HEMOGLOBIN A1C
Hgb A1c MFr Bld: 7.9 % — ABNORMAL HIGH (ref 4.8–5.6)
Mean Plasma Glucose: 180 mg/dL

## 2015-09-23 LAB — PHOSPHORUS: PHOSPHORUS: 7 mg/dL — AB (ref 2.5–4.6)

## 2015-09-23 MED ORDER — MUPIROCIN CALCIUM 2 % EX CREA
TOPICAL_CREAM | Freq: Every day | CUTANEOUS | Status: DC
  Administered 2015-09-24: 11:00:00 via TOPICAL
  Filled 2015-09-23: qty 15

## 2015-09-23 MED ORDER — DEXTROSE 50 % IV SOLN
INTRAVENOUS | Status: AC
Start: 2015-09-23 — End: 2015-09-23
  Administered 2015-09-23: 50 mL
  Filled 2015-09-23: qty 50

## 2015-09-23 MED ORDER — VANCOMYCIN HCL IN DEXTROSE 1-5 GM/200ML-% IV SOLN
1000.0000 mg | INTRAVENOUS | Status: DC
  Administered 2015-09-24: 1000 mg via INTRAVENOUS
  Filled 2015-09-23 (×2): qty 200

## 2015-09-23 NOTE — Consult Note (Signed)
[place] for :   Vancomycin Indication:  Bacteremia,with + Gm Positive Rods x 2  Hospital Problems: Principal Problem:   Hypoglycemia Active Problems:   Anemia   CHF (congestive heart failure) (H[facility])   Below knee amputation status (H[facility])   Type 2 diabetes mellitus with polyneuropathy ([facility])   End-stage renal disease on hemodialysis ([facility])   Cellulitis   Hypokalemia   Cellulitis of right lower extremity   Hypothermia   Allergies: Allergies  Allergen Reactions  . Flu Virus Vaccine     Gets the flu, patient has bad reaction to the flu vaccine.  . Lipitor [Atorvastatin]     Leg cramps    Patient Measurements: Height: 5\' 9"  (175.3 cm) Weight: 226 lb 10.1 oz (102.8 kg) IBW/kg (Calculated) : 70.7  Vancomycin Dosing Weight:  103 kg    Vital Signs: Temp: 97.5 F (36.4 C) (04/18 1519) Temp Source: Oral (04/18 1519) BP: 150/66 mmHg (04/18 1519) Pulse Rate: 81 (04/18 1519)  Labs:  Recent Labs  09/22/15 1157 09/22/15 1653 09/23/15 0636  WBC 9.5 8.8 9.5  HGB 11.2* 11.4* 10.1*  PLT 286 332 334  CREATININE 6.96* 6.83* 6.40*   Estimated Creatinine Clearance: 12.3 mL/min (by C-G formula based on Cr of 6.4).  Microbiology: Recent Results (from the past 720 hour(s))  Blood Culture (routine x 2)     Status: None (Preliminary result)   Collection Time: 09/22/15 11:57 AM  Result Value Ref Range Status   Specimen Description BLOOD LEFT HAND  Final   Special Requests BOTTLES DRAWN AEROBIC AND ANAEROBIC 5CC  Final   Culture  Setup Time   Final    GRAM POSITIVE COCCI IN CLUSTERS IN BOTH AEROBIC AND ANAEROBIC BOTTLES CRITICAL RESULT CALLED TO, READ BACK BY AND VERIFIED WITH: J. ADAMS,RN AT 0758 ON [id number] BY Rhea Bleacher    Culture PENDING  Incomplete   Report Status PENDING  Incomplete  Blood Culture (routine x 2)     Status: None (Preliminary result)   Collection Time: 09/22/15 12:17 PM  Result Value Ref Range Status   Specimen Description  BLOOD RIGHT HAND  Final   Special Requests IN PEDIATRIC BOTTLE 4CC  Final   Culture  Setup Time   Final    GRAM POSITIVE RODS IN PEDIATRIC BOTTLE CRITICAL RESULT CALLED TO, READ BACK BY AND VERIFIED WITH: J. NARAMBAS RN 16:45 09/23/15 ([email or web address])    Culture PENDING  Incomplete   Report Status PENDING  Incomplete  MRSA PCR Screening     Status: None   Collection Time: 09/22/15  9:00 PM  Result Value Ref Range Status   MRSA by PCR NEGATIVE NEGATIVE Final    Comment:        The GeneXpert MRSA Assay (FDA approved for NASAL specimens only), is one component of a comprehensive MRSA colonization surveillance program. It is not intended to diagnose MRSA infection nor to guide or monitor treatment for MRSA infections.     Medical/Surgical History: Past Medical History  Diagnosis Date  . Hypertension   . Hypothyroidism   . CHF (congestive heart failure) ([place])   . Anemia   . Blind left eye   . Sleep apnea     not on CPAP  . Coronary artery disease     CABG x 11 Jun 2009.  MRSA infections of incsions  . Arthritis   . Sarcoidosis ([place])     Hx:of  . Diabetic retinopathy ([place])     Hx: of  bilateral  . Edema   . Hyperlipidemia   . 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 Atherosclerosis of native arteries of the extremities, unspecified   . Eczema     Hx: of  . Insomnia   . Seasonal allergies   . History of blood transfusion     with heart surgery  . Coronary atherosclerosis of native coronary artery   . Atherosclerosis of native arteries of the extremities, unspecified   . Pneumonia   . Diabetes mellitus     type 2  . ESRD on dialysis [facility])     peritoneal dialysis - 5 times a time 9a, 12n, 3p, 6p, 9p  . GERD (gastroesophageal reflux disease)   . Neuropathy ([place])   . Cancer ([place])     skin cancer - melanoma on back   Past Surgical History  Procedure Laterality Date  . Cardiac surgery      total of 6 surgeries, 5 related to mrsa  . Coronary artery  bypass graft  06/2009  . Cataract surgery      Hx: of  . Debridements      Hx: of secondary to MRSA  . Av fistula placement Right 10/11/2012    Procedure: ARTERIOVENOUS (AV) FISTULA CREATION;  Surgeon: Rosetta Posner, MD;  Location: [place];  Service: Vascular;  Laterality: Right;  . Insertion of dialysis catheter Left   . Capd insertion N/A 11/28/2012    Procedure: LAPAROSCOPIC PERITONEAL DIALYSIS CATHETER PLACEMENT;  Surgeon: Ralene Ok, MD;  Location: [place];  Service: General;  Laterality: N/A;  . Left heart catheterization with coronary/graft angiogram N/A 01/31/2014    Procedure: LEFT HEART CATHETERIZATION WITH Beatrix Fetters;  Surgeon: Troy Sine, MD;  Location: [facility] CATH LAB;  Service: Cardiovascular;  Laterality: N/A;  . Peripheral vascular catheterization N/A 04/28/2015    Procedure: Abdominal Aortogram w/Lower Extremity;  Surgeon: Conrad McRae, MD;  Location: [place] CV LAB;  Service: Cardiovascular;  Laterality: N/A;  . Amputation Left 04/30/2015    Procedure: 1st Ray Amputation Left Foot;  Surgeon: Newt Minion, MD;  Location: [place];  Service: Orthopedics;  Laterality: Left;  . Esophagogastroduodenoscopy N/A 05/20/2015    Procedure: ESOPHAGOGASTRODUODENOSCOPY (EGD);  Surgeon: Wilford Corner, MD;  Location: [facility] ENDOSCOPY;  Service: Endoscopy;  Laterality: N/A;  Possible esophageal dilatation  . Eye surgery Bilateral     cataract surgery with lens implants  . Amputation Left 06/13/2015    Procedure: Left Below Knee Amputation;  Surgeon: Newt Minion, MD;  Location: [place];  Service: Orthopedics;  Laterality: Left;    Current Medication[s] Include: Scheduled:  Scheduled:  . allopurinol  100 mg Oral Daily  . aspirin  325 mg Oral Daily  . calcium carbonate  2 tablet Oral TID WC  . dialysis solution 1.5% low-MG/low-CA   Intraperitoneal 4 times per day  . feeding supplement (NEPRO CARB STEADY)  237 mL Oral TID BM  . heparin  5,000 Units Subcutaneous 3 times per day   . insulin aspart  0-9 Units Subcutaneous TID WC  . levothyroxine  112 mcg Oral QAC breakfast  . midodrine  10 mg Oral TID  . multivitamin  1 tablet Oral Daily  . [START ON 09/24/2015] mupirocin cream   Topical Daily  . nitroGLYCERIN  0.2 mg Transdermal Daily  . potassium chloride  20 mEq Oral BID  . sodium chloride flush  3 mL Intravenous Q12H   Infusion[s]: Infusions:  . sodium chloride 100  mL/hr at 09/23/15 0400   Antibiotic[s]: Anti-infectives    Start     Dose/Rate Route Frequency Ordered Stop   09/22/15 2200  amoxicillin-clavulanate (AUGMENTIN) 875-125 MG per tablet 1 tablet  Status:  Discontinued     1 tablet Oral Every 12 hours 09/22/15 1629 09/22/15 1654   09/22/15 2030  piperacillin-tazobactam (ZOSYN) IVPB 2.25 g  Status:  Discontinued     2.25 g 100 mL/hr over 30 Minutes Intravenous Every 8 hours 09/22/15 1423 09/22/15 1629   09/22/15 1230  vancomycin (VANCOCIN) 2,500 mg in sodium chloride 0.9 % 500 mL IVPB     2,500 mg 250 mL/hr over 120 Minutes Intravenous  Once 09/22/15 1223 09/22/15 1609   09/22/15 1215  piperacillin-tazobactam (ZOSYN) IVPB 3.375 g     3.375 g 100 mL/hr over 30 Minutes Intravenous  Once 09/22/15 1214 09/22/15 1327   09/22/15 1215  vancomycin (VANCOCIN) IVPB 1000 mg/200 mL premix  Status:  Discontinued     1,000 mg 200 mL/hr over 60 Minutes Intravenous  Once 09/22/15 1214 09/22/15 1223     Assessment:  75 y/o male admitted yesterday with cellulitis.  Patient has a history of ESRD and is on PD at home.  He is currently on CAPD.    Vancomycin and Zosyn started initially, but was changed to Augmentin.    Microbiology has reported + Blood cultures of Gm Positive Rods in Clusters x 2 bottles.  Vancomycin will be restarted as per protocol.  Goal of Therapy:  Random Vancomycin level 15 - 25 mcg/ml  Plan:   Resume Vancomycin 10 mg/kg q 24 hours while patient is on CAPD.  Next dose now. [Loading dose given 09/22/15.]  Follow up cultures, clinical  course, CAPD orders, Vancomycin levels as clinically indicated, length of therapy.  Aveion Nguyen, Zettie Pho,  Pharm.D,    4/18/20175:27 PM

## 2015-09-23 NOTE — Progress Notes (Signed)
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 Following admission, symptoms improved once blood sugar stabilized. Blood cultures grew out 1/4 bottles for gram-positive rods in 1/4 bottles for gram-positive clusters. Patient afebrile.  Currently doing well  Assessment/Plan: Principal Problem: Type 2 diabetes mellitus, poorly controlled with polyneuropathy  Hypoglycemia: Recurrent issue. Because of patient's perineal dialysis, this leads to episodes of hyperglycemia during the day and then in the mornings, he often has issues with hypoglycemia. Long-term, suspect we'll need lower doses of Lantus and higher doses of short-acting NovoLog. Need to rule out infection. See below. 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 Patient does not appear to be septic or have generalized infection. I believe both positive blood cultures may be contaminants, nevertheless, we'll cover for gram positives as infection would account for hypoglycemia and hypothermia. Appreciate wound care   Hypothermia   Code Status: Full code  Family Communication: wife at the bedside   Disposition Plan:  if blood sugars remain stable and blood cultures are contaminants, anticipate discharge home tomorrow    Consultants:  Wound care   procedures:  Peritoneal dialysis at the bedside  Antibiotics:  IV vancomycin 4/18-present    Objective: BP 150/66 mmHg  Pulse 81  Temp(Src) 97.5 F (36.4 C) (Oral)  Resp 20  Ht 5\' 9"  (1.753 m)   Wt 102.8 kg (226 lb 10.1 oz)  BMI 33.45 kg/m2  SpO2 99%  Intake/Output Summary (Last 24 hours) at 09/23/15 1910 Last data filed at 09/23/15 1852  Gross per 24 hour  Intake  13840 ml  Output  13000 ml  Net    840 ml   Filed Weights   09/22/15 1137 09/22/15 2002 09/23/15 0953  Weight: 118 kg (260 lb 2.3 oz) 101.1 kg (222 lb 14.2 oz) 102.8 kg (226 lb 10.1 oz)    Exam:   General: Alert and oriented 3    Cardiovascular:  Regular rate and rhythm, S1-S2  Respiratory: Clear to auscultation bilaterally  Abdomen:  Soft, nontender, nondistended, positive bowel sounds  Musculoskeletal:    Status post left BKA and noted wounds which are currently covered with dressing on right toes  Data Reviewed: Basic Metabolic Panel:  Recent Labs Lab 09/22/15 1157 09/22/15 1653 09/23/15 0636 09/23/15 1447  NA 136  --  136  --   K 3.0*  --  3.0*  --   CL 96*  --  98*  --   CO2 24  --  24  --   GLUCOSE 90  --  59*  --   BUN 56*  --  52*  --   CREATININE 6.96* 6.83* 6.40*  --   CALCIUM 8.0*  --  7.5*  --   PHOS  --   --   --  7.0*   Liver Function Tests:  Recent Labs Lab 09/22/15 1157 09/23/15 0636  AST 28 22  ALT 16* 14*  ALKPHOS 131* 109  BILITOT 0.7 0.9  PROT 5.9* 5.4*  ALBUMIN 1.8* 1.5*   No results for input(s): LIPASE, AMYLASE in the last  168 hours. No results for input(s): AMMONIA in the last 168 hours. CBC:  Recent Labs Lab 09/22/15 1157 09/22/15 1653 09/23/15 0636  WBC 9.5 8.8 9.5  NEUTROABS 7.9*  --   --   HGB 11.2* 11.4* 10.1*  HCT 36.0* 36.9* 32.8*  MCV 92.8 92.9 92.1  PLT 286 332 334   Cardiac Enzymes:   No results for input(s): CKTOTAL, CKMB, CKMBINDEX, TROPONINI in the last 168 hours. BNP (last 3 results)  Recent Labs  05/18/15 1139  BNP 434.4*    ProBNP (last 3 results) No results for input(s): PROBNP in the last 8760 hours.  CBG:  Recent Labs Lab 09/22/15 2142 09/23/15 0747 09/23/15 0839 09/23/15 1200 09/23/15 1644  GLUCAP 78 32*  109* 85 252*    Recent Results (from the past 240 hour(s))  Blood Culture (routine x 2)     Status: None (Preliminary result)   Collection Time: 09/22/15 11:57 AM  Result Value Ref Range Status   Specimen Description BLOOD LEFT HAND  Final   Special Requests BOTTLES DRAWN AEROBIC AND ANAEROBIC 5CC  Final   Culture  Setup Time   Final    GRAM POSITIVE COCCI IN CLUSTERS IN BOTH AEROBIC AND ANAEROBIC BOTTLES CRITICAL RESULT CALLED TO, READ BACK BY AND VERIFIED WITH: J. ADAMS,RN AT 0758 ON [id number] BY Rhea Bleacher    Culture PENDING  Incomplete   Report Status PENDING  Incomplete  Blood Culture (routine x 2)     Status: None (Preliminary result)   Collection Time: 09/22/15 12:17 PM  Result Value Ref Range Status   Specimen Description BLOOD RIGHT HAND  Final   Special Requests IN PEDIATRIC BOTTLE 4CC  Final   Culture  Setup Time   Final    GRAM POSITIVE RODS IN PEDIATRIC BOTTLE CRITICAL RESULT CALLED TO, READ BACK BY AND VERIFIED WITH: J. NARAMBAS RN 16:45 09/23/15 ([email or web address])    Culture PENDING  Incomplete   Report Status PENDING  Incomplete  MRSA PCR Screening     Status: None   Collection Time: 09/22/15  9:00 PM  Result Value Ref Range Status   MRSA by PCR NEGATIVE NEGATIVE Final    Comment:        The GeneXpert MRSA Assay (FDA approved for NASAL specimens only), is one component of a comprehensive MRSA colonization surveillance program. It is not intended to diagnose MRSA infection nor to guide or monitor treatment for MRSA infections.      Studies: No results found.  Scheduled Meds: . allopurinol  100 mg Oral Daily  . aspirin  325 mg Oral Daily  . calcium carbonate  2 tablet Oral TID WC  . dialysis solution 1.5% low-MG/low-CA   Intraperitoneal 4 times per day  . feeding supplement (NEPRO CARB STEADY)  237 mL Oral TID BM  . heparin  5,000 Units Subcutaneous 3 times per day  . insulin aspart  0-9 Units Subcutaneous TID WC  . levothyroxine  112 mcg Oral QAC breakfast    . midodrine  10 mg Oral TID  . multivitamin  1 tablet Oral Daily  . [START ON 09/24/2015] mupirocin cream   Topical Daily  . nitroGLYCERIN  0.2 mg Transdermal Daily  . potassium chloride  20 mEq Oral BID  . sodium chloride flush  3 mL Intravenous Q12H  . vancomycin  1,000 mg Intravenous Q24H    Continuous Infusions: . sodium chloride 100 mL/hr at 09/23/15 0400     Time spent: 15 minutes    Jack Chung  K  [facility] Pager [phone]  . If 7PM-7AM, please contact night-coverage at [email or web address], password [facility] 09/23/2015, 7:10 PM

## 2015-09-23 NOTE — Progress Notes (Signed)
2nd bottle blood cultures grew gram positive rods, Dr. Gevena Barre made aware.

## 2015-09-23 NOTE — Progress Notes (Signed)
Patient is positive for gram positive cocci in clusters, Dr Gevena Barre notified via text.

## 2015-09-23 NOTE — Consult Note (Signed)
WOC wound consult note Reason for Consult: Consult requested for left stump and right toe wounds.  Pt and his wife are well-informed regarding topical treatment and he is followed by Dr [name] as an outpatient.  Wound type: Full thickness wound to left stump where surgical wound dehisced in one location.  Pt states this has greatly improved. 4.5X1.5X.1cm, 90% red, 10% yellow, small amt tan drainage, no odor. Right toe with 2 areas of eschar; 2X1.5cm and .5X.5cm; no odor, drainage, or fluctuance. Dressing procedure/placement/frequency: Continue present plan of care as previously ordered by Dr [name]; cleanse wound and apply dry dressing to left stump and cover with stump shrinker stocking, and Bactroban and dry dressing to right toe wounds.  Pt can resume follow-up with Dr [name] after discharge and they deny further questions. Please re-consult if further assistance is needed.  Thank-you,  Julien Girt MSN, [place], [place], [place], [place]

## 2015-09-23 NOTE — Progress Notes (Signed)
Occupational Therapy Evaluation Patient Details Name: Jack Chung MRN: [id number] DOB: 1940-09-18 Today's Date: 09/23/2015    History of Present Illness 75 y.o. male s/p L BKA 1/17 with a Past Medical History of HTN, hyperthyroidism, CHF, CAD status post CABG, sarcoidosis, DM, HLD, ESRD, peripheral neuropathy, GERD who presents with acute encephalopathy likely secondary to hypoglycemia and hypothermia.   Clinical Impression   PTA, pt was receiving HH services. Pt is close to baseline level of functioning. Wife reports pt has returned to his usual "abnormal state". Pt uses slide board to transfer and wife assists with ADL and transfers as needed. Encouraged pt to complete his HEP while in the hospital. Feel pt will be able to D/C home and resume HHOT/PT at D/C. Recommend staff help pt OOB daily with use of transfer board which was left in pt room for use. Nurse educated on use. No further acute OT needs. OT signing off.     Follow Up Recommendations  Home health OT;Supervision/Assistance - 24 hour    Equipment Recommendations  None recommended by OT    Recommendations for Other Services       Precautions / Restrictions Precautions Precautions: Fall      Mobility Bed Mobility Overal bed mobility: Needs Assistance Bed Mobility: Supine to Sit     Supine to sit: Min assist     General bed mobility comments: wifes states close to baseline  Transfers Overall transfer level: Needs assistance Equipment used:  (transfer board) Transfers: Lateral/Scoot Transfers          Lateral/Scoot Transfers: Min assist;With slide board      Balance Overall balance assessment: Needs assistance   Sitting balance-Leahy Scale: Fair                                      ADL Overall ADL's : At baseline                      At baseline mod A with LB ADL, Min A with UB ADl. Incontinent of BM - uses depends                 General ADL Comments: Pt  currently close to baseline level with ADL and mobility. states he feels a little weaker due to being in the bed     Vision     Perception     Praxis      Pertinent Vitals/Pain Pain Assessment: No/denies pain     Hand Dominance Right   Extremity/Trunk Assessment Upper Extremity Assessment Upper Extremity Assessment: [name] deficits/detail;Generalized weakness [name] Deficits / Details: wasting of R hand intrinsics; decreased fine motor but able ot use funcitonally   Lower Extremity Assessment Lower Extremity Assessment: LLE deficits/detail;RLE deficits/detail RLE Deficits / Details: wound on great toe. ROM and strength overall at basleine LLE Deficits / Details: L BKA   Cervical / Trunk Assessment Cervical / Trunk Assessment: Normal   Communication Communication Communication: No difficulties   Cognition Arousal/Alertness: Awake/alert Behavior During Therapy: WFL for tasks assessed/performed Overall Cognitive Status: Within Functional Limits for tasks assessed                     General Comments       Exercises Exercises: Other exercises Other Exercises Other Exercises: encouraged pt to complete his HEP while in hospital to prevent weakness   Shoulder Instructions  Home Living Family/patient expects to be discharged to:: Private residence Living Arrangements: Spouse/significant other Available Help at Discharge: Family;Available 24 hours/day Type of Home: Apartment Home Access: Ramped entrance     Home Layout: One level     Bathroom Shower/Tub: Occupational psychologist: Handicapped height Bathroom Accessibility: Yes How Accessible: Accessible via wheelchair Home Equipment: [place] - 2 wheels;Shower seat;Electric scooter;Cane - single point;Bedside commode;Wheelchair - manual          Prior Functioning/Environment Level of Independence: Needs assistance  Gait / Transfers Assistance Needed: not ambulating; mod I with w/c mobility; wife  assist @ minA level with transfer board lateral scoot transfers ADL's / Homemaking Assistance Needed: wife assists with bathing/dressing        OT Diagnosis: Generalized weakness;Cognitive deficits   OT Problem List: Decreased strength;Decreased range of motion;Decreased activity tolerance;Impaired balance (sitting and/or standing);Decreased safety awareness;Impaired sensation;Impaired UE functional use;Obesity   OT Treatment/Interventions:      OT Goals(Current goals can be found in the care plan section) Acute Rehab OT Goals Patient Stated Goal: to go home OT Goal Formulation: All assessment and education complete, DC therapy Time For Goal Achievement: 09/23/15  OT Frequency:     Barriers to D/C:            Co-evaluation              End of Session Nurse Communication: Mobility status;Other (comment) (use transfer board left in room)  Activity Tolerance: Patient tolerated treatment well Patient left: in chair;with call bell/phone within reach;with family/visitor present   Time: 1605-1630 OT Time Calculation (min): 25 min Charges:  OT General Charges $OT Visit: 1 Procedure OT Evaluation $OT Eval Moderate Complexity: 1 Procedure G-Codes: OT G-codes **NOT FOR INPATIENT CLASS** Functional Assessment Tool Used: clinical judgement Functional Limitation: Self care Self Care Current Status [id number]): At least 20 percent but less than 40 percent impaired, limited or restricted Self Care Goal Status [id number]): At least 20 percent but less than 40 percent impaired, limited or restricted Self Care Discharge Status [phone]): At least 20 percent but less than 40 percent impaired, limited or restricted  Shakiya Mcneary,H[place] 09/23/2015, 4:44 PM   [facility], OTR/L  [phone] 09/23/2015

## 2015-09-24 ENCOUNTER — Ambulatory Visit: Payer: Medicare Other | Admitting: Vascular Surgery

## 2015-09-24 DIAGNOSIS — T68XXXD Hypothermia, subsequent encounter: Secondary | ICD-10-CM | POA: Diagnosis not present

## 2015-09-24 DIAGNOSIS — Z89512 Acquired absence of left leg below knee: Secondary | ICD-10-CM

## 2015-09-24 DIAGNOSIS — L89109 Pressure ulcer of unspecified part of back, unspecified stage: Secondary | ICD-10-CM | POA: Diagnosis present

## 2015-09-24 DIAGNOSIS — N186 End stage renal disease: Secondary | ICD-10-CM | POA: Diagnosis not present

## 2015-09-24 DIAGNOSIS — E1142 Type 2 diabetes mellitus with diabetic polyneuropathy: Secondary | ICD-10-CM

## 2015-09-24 DIAGNOSIS — E162 Hypoglycemia, unspecified: Secondary | ICD-10-CM | POA: Diagnosis not present

## 2015-09-24 LAB — CULTURE, BLOOD (ROUTINE X 2)

## 2015-09-24 LAB — GLUCOSE, CAPILLARY
GLUCOSE-CAPILLARY: 186 mg/dL — AB (ref 65–99)
GLUCOSE-CAPILLARY: 168 mg/dL — AB (ref 65–99)

## 2015-09-24 MED ORDER — RENA-VITE PO TABS: 1.0000 | ORAL_TABLET | Freq: Every day | ORAL | Status: DC

## 2015-09-24 NOTE — Discharge Summary (Signed)
Discharge Summary  Jack Chung [id number] DOB: 1940-10-16  PCP: [name], MD  Admit date: 09/22/2015 Discharge date: 09/24/2015  Time spent: 25 minutes   Recommendations for Outpatient Follow-up:  1. No medication changes, although recommended continued discussion with patient's PCP in regards to finding balance between less long-term acting insulin to prevent hypoglycemic episodes in the morning and more rapid acting insulin for hyperglycemia 2. Patient will see his PCP in 2 weeks at that time repeat blood cultures recommended  Discharge Diagnoses:  Active Hospital Problems   Diagnosis Date Noted  . Hypoglycemia 09/22/2015  . Pressure ulcer of back 09/24/2015  . Cellulitis 09/22/2015  . Hypokalemia 09/22/2015  . Cellulitis of right lower extremity   . Hypothermia   . Type 2 diabetes mellitus with polyneuropathy ([place]) 06/16/2015  . End-stage renal disease on hemodialysis ([place])   . Below knee amputation status ([place]) 06/13/2015  . CHF (congestive heart failure) ([place]) 10/30/2012  . Anemia 10/04/2012    Resolved Hospital Problems   Diagnosis Date Noted Date Resolved  No resolved problems to display.    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   Hospital Course:  Principal Problem:   Type 2 diabetes mellitus, poorly controlled with polyneuropathy and Hypoglycemia: Recurrent issue. Following admission, with treatments of dextrose, hypoglycemia soon resolved. This is been a significant problem with  the patient. The wife and him are very attentive and familiar with his symptoms. Because of his peritoneal dialysis, this leads to episodes of hyperglycemia during the day leading to higher doses of long-term acting insulin, but then in the mornings he has episodes of hypoglycemia. Long-term, suspect he will need lower doses of Lantus and higher doses of short-acting NovoLog.  We also wanted to make sure we will doubt infection, especially given his chronic lower extremity wounds. Following admission, blood cultures grew out 1/4 bottles for gram-positive rods and 2/4 bottles for coag negative staph. Patient himself did not appear to be clinically ill he was afebrile with normal white count. I discussed this with infectious disease and we felt that these were contaminants. For completeness sake, they recommended checking repeat blood cultures in 2 weeks when he sees his PCP. In the meantime should he spike a fever, will follow-up with his PCP or come to the emergency room Active Problems:   Anemia    End-stage renal disease on peritoneal dialysis [facility]): Continue during hospitalization. Main issue is that with peritoneal dialysis, sugar water use which leads to hyperglycemia    Hypokalemia   Chronic left stump/right toe wounds: Patient and his wife are followed by Dr.Duda as outpatient. They have been continuing for an acute care outlined by them which was continued in the hospital including wound cleansing, dry dressings and Bactroban   Hypothermia: Secondary to hypoglycemia, resolved on admission   Pressure ulcer of back: Present on admission. Seen by wound care   Procedures:  None   Consultations:  Wound care   Discharge Exam: BP 158/68 mmHg  Pulse 78  Temp(Src) 97.4 F (  36.3 C) (Oral)  Resp 18  Ht 5\' 9"  (1.753 m)  Wt 104.327 kg (230 lb)  BMI 33.95 kg/m2  SpO2 99%  General: Alert and oriented 3  Cardiovascular: Regular rate and rhythm, S1-S2  Respiratory: Clear to auscultation  bilaterally   Discharge Instructions You were cared for by a hospitalist during your hospital stay. If you have any questions about your discharge medications or the care you received while you were in the hospital after you are discharged, you can call the unit and asked to speak with the hospitalist on call if the hospitalist that took care of you is not available. Once you are discharged, your primary care physician will handle any further medical issues. Please note that NO REFILLS for any discharge medications will be authorized once you are discharged, as it is imperative that you return to your primary care physician (or establish a relationship with a primary care physician if you do not have one) for your aftercare needs so that they can reassess your need for medications and monitor your lab values.  Discharge Instructions    Diet Carb Modified    Complete by:  As directed      Increase activity slowly    Complete by:  As directed             Medication List    TAKE these medications        ACIDOPHILUS PROBIOTIC PO  Take 2 tablets by mouth daily.     allopurinol 100 MG tablet  Commonly known as:  ZYLOPRIM  Take 100 mg by mouth daily.     aspirin 325 MG EC tablet  Take 325 mg by mouth daily.     calcium carbonate 500 MG chewable tablet  Commonly known as:  TUMS - dosed in mg elemental calcium  Chew 2 tablets by mouth 3 (three) times daily with meals.     cetirizine 10 MG tablet  Commonly known as:  ZYRTEC  Take 10 mg by mouth daily as needed for allergies.     feeding supplement (NEPRO CARB STEADY) Liqd  Drink 1 can by mouth three times a day between meals     gentamicin cream 0.1 %  Commonly known as:  GARAMYCIN  APPLY TO THE AFFECTED AREAS AS DIRECTED.     insulin aspart 100 UNIT/ML injection  Commonly known as:  novoLOG  Inject 10 Units into the skin 3 (three) times daily with meals. To give 10 units plus SSI -Take none if blood sugar < 150, 151-200 2 units,  201-250 4 units, 251-300 6 units, 301-350 8 units     Insulin Glargine 100 UNIT/ML Solostar Pen  Commonly known as:  LANTUS  Inject 20 units into the skin daily. Dx 250.00     lactulose 10 GM/15ML solution  Commonly known as:  CHRONULAC  Take 10 g by mouth every 4 (four) hours as needed for mild constipation.     levothyroxine 112 MCG tablet  Commonly known as:  SYNTHROID, LEVOTHROID  LABS/APPOINTMENT OVERDUE, 1 by mouth daily in the morning 30 minutes before breakfast for Thyroid     loperamide 2 MG capsule  Commonly known as:  IMODIUM  Take 2 mg by mouth daily as needed for diarrhea or loose stools.     metoCLOPramide 5 MG tablet  Commonly known as:  REGLAN  Take 2 tablets (10 mg total) by mouth 3 (three) times daily before meals.     midodrine 10 MG tablet  Commonly known as:  PROAMATINE  Take 1 tablet (10 mg total) by mouth 3 (three) times daily.     multivitamin Tabs tablet  Take 1 tablet by mouth daily.     nitroGLYCERIN 0.2 mg/hr patch  Commonly known as:  NITRODUR - Dosed in mg/24 hr  Place 0.2 mg onto the skin daily.     omeprazole 10 MG capsule  Commonly known as:  PRILOSEC  Take 10 mg by mouth daily. Reported on 08/01/2015     oxyCODONE-acetaminophen 5-325 MG tablet  Commonly known as:  PERCOCET/ROXICET  Take one tablet by mouth every 6 hours as needed for moderate pain     potassium chloride SA 20 MEQ tablet  Commonly known as:  K-DUR,KLOR-CON  Take 20 mEq by mouth daily.       Allergies  Allergen Reactions  . Flu Virus Vaccine     Gets the flu, patient has bad reaction to the flu vaccine.  . Lipitor [Atorvastatin]     Leg cramps       Follow-up Information    Follow up with SHAW,KIMBERLEE, MD In 2 weeks.   Specialty:  Family Medicine   Contact information:   [address]  [place] [address] [phone]        The results of significant diagnostics from this hospitalization (including imaging, microbiology, ancillary  and laboratory) are listed below for reference.    Significant Diagnostic Studies: Dg Ankle Complete Right  09/22/2015  CLINICAL DATA:  Diabetic patient with sores about the right foot and ankle. Pain. Initial encounter. EXAM: RIGHT ANKLE - COMPLETE 3+ VIEW COMPARISON:  None. FINDINGS: No acute bony or joint abnormality is identified. No radiopaque foreign body or soft tissue gas collection is seen. No bony destructive change or periostitis is identified. Dorsal calcaneal spur is noted and extensive atherosclerosis are noted. IMPRESSION: No acute abnormality. Extensive atherosclerosis. Electronically Signed   By: Inge Rise M.D.   On: 09/22/2015 13:39   Dg Chest Port 1 View  09/22/2015  CLINICAL DATA:  Hypoglycemia EXAM: PORTABLE CHEST 1 VIEW COMPARISON:  05/18/2015 FINDINGS: Cardiac shadow remains enlarged. The lungs are well aerated bilaterally. No focal infiltrate or sizable effusion is seen. Postoperative changes at the medial left lung base are again noted. IMPRESSION: No acute abnormality noted. Electronically Signed   By: Inez Catalina M.D.   On: 09/22/2015 12:42   Dg Foot Complete Right  09/22/2015  CLINICAL DATA:  Diabetic patient with wounds on the right foot. Swollen and red great toe. Initial encounter. EXAM: RIGHT FOOT COMPLETE - 3+ VIEW COMPARISON:  None. FINDINGS: No acute bony or joint abnormality is identified. No bony destructive change or periosteal reaction is seen. No radiopaque foreign body or soft tissue gas collection is identified. Soft tissues about the great toe appear swollen. Extensive atherosclerosis is noted. IMPRESSION: Soft tissues about the right great toe appear swollen without underlying bony or joint abnormality. Atherosclerosis. Electronically Signed   By: Inge Rise M.D.   On: 09/22/2015 13:37    Microbiology: Recent Results (from the past 240 hour(s))  Blood Culture (routine x 2)     Status: Abnormal (Preliminary result)   Collection Time: 09/22/15  11:57 AM  Result Value Ref Range Status   Specimen Description BLOOD LEFT HAND  Final   Special Requests BOTTLES DRAWN AEROBIC AND ANAEROBIC 5CC  Final   Culture  Setup Time   Final    GRAM POSITIVE COCCI IN CLUSTERS IN BOTH AEROBIC AND  ANAEROBIC BOTTLES CRITICAL RESULT CALLED TO, READ BACK BY AND VERIFIED WITH: J. ADAMS,RN AT 0758 ON [id number] BY Rhea Bleacher    Culture (A)  Final    STAPHYLOCOCCUS SPECIES (COAGULASE NEGATIVE) THE SIGNIFICANCE OF ISOLATING THIS ORGANISM FROM A SINGLE SET OF BLOOD CULTURES WHEN MULTIPLE SETS ARE DRAWN IS UNCERTAIN. PLEASE NOTIFY THE MICROBIOLOGY DEPARTMENT WITHIN ONE WEEK IF SPECIATION AND SENSITIVITIES ARE REQUIRED.    Report Status PENDING  Incomplete  Blood Culture (routine x 2)     Status: Abnormal   Collection Time: 09/22/15 12:17 PM  Result Value Ref Range Status   Specimen Description BLOOD RIGHT HAND  Final   Special Requests IN PEDIATRIC BOTTLE 4CC  Final   Culture  Setup Time   Final    GRAM POSITIVE RODS IN PEDIATRIC BOTTLE CRITICAL RESULT CALLED TO, READ BACK BY AND VERIFIED WITH: J. NARAMBAS RN 16:45 09/23/15 ([email or web address])    Culture (A)  Final    DIPHTHEROIDS(CORYNEBACTERIUM SPECIES) Standardized susceptibility testing for this organism is not available.    Report Status 09/24/2015 FINAL  Final  MRSA PCR Screening     Status: None   Collection Time: 09/22/15  9:00 PM  Result Value Ref Range Status   MRSA by PCR NEGATIVE NEGATIVE Final    Comment:        The GeneXpert MRSA Assay (FDA approved for NASAL specimens only), is one component of a comprehensive MRSA colonization surveillance program. It is not intended to diagnose MRSA infection nor to guide or monitor treatment for MRSA infections.      Labs: Basic Metabolic Panel:  Recent Labs Lab 09/22/15 1157 09/22/15 1653 09/23/15 0636 09/23/15 1447  NA 136  --  136  --   K 3.0*  --  3.0*  --   CL 96*  --  98*  --   CO2 24  --  24  --   GLUCOSE 90  --  59*  --   BUN 56*   --  52*  --   CREATININE 6.96* 6.83* 6.40*  --   CALCIUM 8.0*  --  7.5*  --   PHOS  --   --   --  7.0*   Liver Function Tests:  Recent Labs Lab 09/22/15 1157 09/23/15 0636  AST 28 22  ALT 16* 14*  ALKPHOS 131* 109  BILITOT 0.7 0.9  PROT 5.9* 5.4*  ALBUMIN 1.8* 1.5*   No results for input(s): LIPASE, AMYLASE in the last 168 hours. No results for input(s): AMMONIA in the last 168 hours. CBC:  Recent Labs Lab 09/22/15 1157 09/22/15 1653 09/23/15 0636  WBC 9.5 8.8 9.5  NEUTROABS 7.9*  --   --   HGB 11.2* 11.4* 10.1*  HCT 36.0* 36.9* 32.8*  MCV 92.8 92.9 92.1  PLT 286 332 334   Cardiac Enzymes: No results for input(s): CKTOTAL, CKMB, CKMBINDEX, TROPONINI in the last 168 hours. BNP: BNP (last 3 results)  Recent Labs  05/18/15 1139  BNP 434.4*    ProBNP (last 3 results) No results for input(s): PROBNP in the last 8760 hours.  CBG:  Recent Labs Lab 09/23/15 0839 09/23/15 1200 09/23/15 1644 09/24/15 0747 09/24/15 1158  GLUCAP 109* 85 252* 186* 168*       Signed:  Gaspard Isbell K  [facility] 09/24/2015, 2:03 PM

## 2015-09-24 NOTE — Care Management Obs Status (Signed)
[place] NOTIFICATION   Patient Details  Name: PIERRE BLAUSER MRN: [id number] Date of Birth: 20-Oct-1940   Medicare Observation Status Notification Given:  Yes    Jacklin Zwick, Rory Percy, RN 09/24/2015, 10:45 AM

## 2015-09-24 NOTE — Progress Notes (Addendum)
Patient Discharge: Disposition: Patient discharged to home with wife. Education: Reviewed all the medications, follow-up appointments and discharge instructions, no prescriptions given.  Understood and acknowledged. IV: Discontinued IV before discharge.   Telemetry: Discontinued before discharge, CCMD notified. Transportation: Patient escorted in w/c out of the unit. Belongings: Patient took all his belongings with him.   Patient was due for PD at 12 noon but patient's wife said she will do it at home.

## 2015-09-24 NOTE — Care Management Note (Signed)
Case Management Note  Patient Details  Name: Jack Chung MRN: [id number] Date of Birth: Apr 12, 1941  Subjective/Objective:             CM following for progression and d/c planning.       Action/Plan: 09/24/2015  CM following for progression and d/c planning.  Expected Discharge Date:     09/24/2015             Expected Discharge Plan:  [place]  In-House Referral:  NA  Discharge planning Services  CM Consult  Post Acute Care Choice:  Home Health Choice offered to:  Patient  DME Arranged:   NA DME Agency:   NA  HH Arranged:  RN, PT, OT HH Agency:  [facility]  Status of Service:  Completed, signed off  Medicare Important Message Given:    Date Medicare IM Given:    Medicare IM give by:    Date Additional Medicare IM Given:    Additional Medicare Important Message give by:     If discussed at [place] of Stay Meetings, dates discussed:    Additional Comments:  Adron Bene, RN 09/24/2015, 11:07 AM

## 2015-09-25 LAB — CULTURE, BLOOD (ROUTINE X 2)

## 2015-10-15 ENCOUNTER — Encounter: Payer: Self-pay | Admitting: Vascular Surgery

## 2015-10-17 ENCOUNTER — Ambulatory Visit (INDEPENDENT_AMBULATORY_CARE_PROVIDER_SITE_OTHER): Payer: Medicare Other | Admitting: Vascular Surgery

## 2015-10-17 ENCOUNTER — Encounter: Payer: Self-pay | Admitting: Vascular Surgery

## 2015-10-17 VITALS — BP 138/69 | HR 80 | Temp 97.2°F | Ht 69.0 in | Wt 230.0 lb

## 2015-10-17 DIAGNOSIS — Z992 Dependence on renal dialysis: Secondary | ICD-10-CM | POA: Diagnosis not present

## 2015-10-17 DIAGNOSIS — I7025 Atherosclerosis of native arteries of other extremities with ulceration: Secondary | ICD-10-CM

## 2015-10-17 DIAGNOSIS — E1151 Type 2 diabetes mellitus with diabetic peripheral angiopathy without gangrene: Secondary | ICD-10-CM | POA: Diagnosis not present

## 2015-10-17 DIAGNOSIS — N186 End stage renal disease: Secondary | ICD-10-CM

## 2015-10-17 MED ORDER — NITROGLYCERIN 0.2 MG/HR TD PT24: MEDICATED_PATCH | TRANSDERMAL | Status: AC

## 2015-10-17 NOTE — Progress Notes (Signed)
[facility] [place] HISTORY AND PHYSICAL -PAD  History of Present Illness Jack Chung is a 75 y.o. male patient of Dr. Bridgett Larsson who presents with chief complaint: right great toe ulcer and more right foot ulcers.   He returns today for evaluation of the above ulcers. He denies any rest pain. He was hospitalized in April 2017 for hypoglycemia. Pt saw Dr. Brigitte Pulse, his PCP, on 10/10/15. Wife states Dr. Brigitte Pulse wonders if [facility].  Wife is applying daily gentamycin cream to the ulcers on his right great toe, lateral aspect right foot.  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 He stands with physical therapy, but otherwise he gets around in a wheelchair. He denies pain in his lower extremities.  He has ESRD and does home peritoneal dialysis with his wife's help. Wife states that even though he has a functioning AV fistula in his right upper arm, he cannot tolerate hemodialysis as his blood pressure drops too low. He states he was exposed to [employer] on a daily basis, working on the equipment that came from [place]   Pt Diabetic: Yes, 7.9 A1C on 09/22/15 (review of records) Pt smoker: non-smoker  Pt meds include: Statin :No, wife states his liver enzymes elevate with statin use Betablocker: No ASA: Yes Other anticoagulants/antiplatelets: no   Past Medical History  Diagnosis Date  . Hypertension   . Hypothyroidism   . CHF (congestive heart failure) ([place])   . Anemia   . Blind left eye   . Sleep apnea     not on CPAP  . Coronary artery disease     CABG x 11 Jun 2009.  MRSA infections of incsions  . Arthritis   . Sarcoidosis ([place])     Hx:of  . Diabetic retinopathy ([place])     Hx: of bilateral  . Edema   . Hyperlipidemia   . 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 Atherosclerosis of native arteries of the extremities, unspecified   . Eczema     Hx: of  . Insomnia   . Seasonal allergies   . History of blood transfusion     with heart surgery  . Coronary atherosclerosis of native coronary artery   . Atherosclerosis of native arteries of the extremities, unspecified   . Pneumonia   . Diabetes mellitus     type 2  . ESRD on dialysis [facility])     peritoneal dialysis - 5 times a time 9a, 12n, 3p, 6p, 9p  . GERD (gastroesophageal reflux disease)   . Neuropathy ([place])   . Cancer ([place])     skin cancer - melanoma on back    Social History Social History  Substance Use Topics  . Smoking status: Never Smoker   . Smokeless tobacco: Never Used  . Alcohol Use: No    Family History Family History  Problem Relation Age of Onset  . COPD Mother   . COPD Father     Past Surgical History  Procedure Laterality Date  . Cardiac surgery      total of 6 surgeries, 5 related to mrsa  . Coronary artery bypass graft  06/2009  . Cataract surgery      Hx: of  .  Debridements      Hx: of secondary to MRSA  . Av fistula placement Right 10/11/2012    Procedure: ARTERIOVENOUS (AV) FISTULA CREATION;  Surgeon: Rosetta Posner, MD;  Location: [place];  Service: Vascular;  Laterality: Right;  . Insertion of dialysis catheter Left   . Capd insertion N/A 11/28/2012    Procedure: LAPAROSCOPIC PERITONEAL DIALYSIS CATHETER PLACEMENT;  Surgeon: Ralene Ok, MD;  Location: [place];  Service: General;  Laterality: N/A;  . Left heart catheterization with coronary/graft angiogram N/A 01/31/2014    Procedure: LEFT HEART CATHETERIZATION WITH Beatrix Fetters;  Surgeon: Troy Sine, MD;  Location: [facility] CATH LAB;  Service: Cardiovascular;  Laterality: N/A;  . Peripheral vascular catheterization N/A 04/28/2015    Procedure: Abdominal Aortogram w/Lower Extremity;  Surgeon: Conrad [place], MD;  Location: [place] CV LAB;  Service:  Cardiovascular;  Laterality: N/A;  . Amputation Left 04/30/2015    Procedure: 1st Ray Amputation Left Foot;  Surgeon: Newt Minion, MD;  Location: [place];  Service: Orthopedics;  Laterality: Left;  . Esophagogastroduodenoscopy N/A 05/20/2015    Procedure: ESOPHAGOGASTRODUODENOSCOPY (EGD);  Surgeon: Wilford Corner, MD;  Location: [facility] ENDOSCOPY;  Service: Endoscopy;  Laterality: N/A;  Possible esophageal dilatation  . Eye surgery Bilateral     cataract surgery with lens implants  . Amputation Left 06/13/2015    Procedure: Left Below Knee Amputation;  Surgeon: Newt Minion, MD;  Location: [place];  Service: Orthopedics;  Laterality: Left;    Allergies  Allergen Reactions  . Flu Virus Vaccine     Gets the flu, patient has bad reaction to the flu vaccine.  . Lipitor [Atorvastatin]     Leg cramps    Current Outpatient Prescriptions  Medication Sig Dispense Refill  . allopurinol (ZYLOPRIM) 100 MG tablet Take 100 mg by mouth daily.    Marland Kitchen aspirin 325 MG EC tablet Take 325 mg by mouth daily.    . calcium carbonate (TUMS - DOSED IN MG ELEMENTAL CALCIUM) 500 MG chewable tablet Chew 2 tablets by mouth 3 (three) times daily with meals.     . cetirizine (ZYRTEC) 10 MG tablet Take 10 mg by mouth daily as needed for allergies.     Marland Kitchen gentamicin cream (GARAMYCIN) 0.1 % APPLY TO THE AFFECTED AREAS AS DIRECTED.  3  . insulin aspart (NOVOLOG) 100 UNIT/ML injection Inject 10 Units into the skin 3 (three) times daily with meals. To give 10 units plus SSI -Take none if blood sugar < 150, 151-200 2 units, 201-250 4 units, 251-300 6 units, 301-350 8 units    . Insulin Glargine (LANTUS) 100 UNIT/ML Solostar Pen Inject 20 units into the skin daily. Dx 250.00 15 mL 6  . Lactobacillus (ACIDOPHILUS PROBIOTIC PO) Take 2 tablets by mouth daily.    Marland Kitchen lactulose (CHRONULAC) 10 GM/15ML solution Take 10 g by mouth every 4 (four) hours as needed for mild constipation.    Marland Kitchen levothyroxine (SYNTHROID, LEVOTHROID) 112 MCG tablet  LABS/APPOINTMENT OVERDUE, 1 by mouth daily in the morning 30 minutes before breakfast for Thyroid 15 tablet 0  . loperamide (IMODIUM) 2 MG capsule Take 2 mg by mouth daily as needed for diarrhea or loose stools.    . metoCLOPramide (REGLAN) 5 MG tablet Take 2 tablets (10 mg total) by mouth 3 (three) times daily before meals. 180 tablet 0  . midodrine (PROAMATINE) 10 MG tablet Take 1 tablet (10 mg total) by mouth 3 (three) times daily. (Patient taking differently: Take 10 mg  by mouth every morning. ) 270 tablet 3  . multivitamin (RENA-VIT) TABS tablet Take 1 tablet by mouth daily.    . nitroGLYCERIN (NITRODUR - DOSED IN MG/24 HR) 0.2 mg/hr patch Place 0.2 mg onto the skin daily.   0  . Nutritional Supplements (FEEDING SUPPLEMENT, NEPRO CARB STEADY,) LIQD Drink 1 can by mouth three times a day between meals    . omeprazole (PRILOSEC) 10 MG capsule Take 10 mg by mouth daily. Reported on 08/01/2015    . oxyCODONE-acetaminophen (PERCOCET/ROXICET) 5-325 MG tablet Take one tablet by mouth every 6 hours as needed for moderate pain 120 tablet 0  . potassium chloride SA (K-DUR,KLOR-CON) 20 MEQ tablet Take 20 mEq by mouth daily.     No current facility-administered medications for this visit.    ROS: See HPI for pertinent positives and negatives.   Physical Examination  Filed Vitals:   10/17/15 1500  BP: 138/69  Pulse: 80  Temp: 97.2 F (36.2 C)  TempSrc: Oral  Height: 5\' 9"  (1.753 m)  Weight: 230 lb (104.327 kg)  SpO2: 100%   Body mass index is 33.95 kg/(m^2).  General: A&O x 3, WDWN, obese male seated in w/c  Eyes: PERRLA  Pulmonary: Sym exp, good air movt, CTAB, no rales, rhonchi, & wheezing  Cardiac: RRR, Nl S1, S2, no detected murmur  Vascular: Vessel Right Left  Radial Not Palpable Palpable  Brachial AV fistula present with strong palpable thrill Palpable  Carotid Palpable, without bruit Palpable, without bruit  Aorta Not palpable N/A  Femoral  Palpable Palpable  Popliteal Not palpable Not palpable  PT Not Palpable BKA  DP Not Palpable BKA   Gastrointestinal: soft, NTND, no G/R, no HSM, no masses, no CVAT B  Musculoskeletal: M/S 5/5 throughout , L BKA with granulating area posterior to incision, R great toe with dry gangrene, base of right great toe with 1.5 cm diameter shallow dry gangrene lesion; another round dry gangrene lesion at lateral aspect and base of right 5th toe, about 1 cm in diameter. Small dry shallow dark lesion at the tip of the right 3rd toe. Right foot and lower leg with 1-2+ pitting and non pitting edema.  Neurologic: Pain and light touch intact in extremities , Motor exam as listed above         Non-Invasive Vascular Imaging: DATE: 10/17/2015 none  ASSESSMENT: MADDYX NAND is a 75 y.o. male who presents s/p L BKA, R SFA stenosis 50-75%, additional areas of dry gangrene on right foot and toes. Pt denies pain in his lower extremities. He has developed more dry gangrene ischemic areas on his right toes and foot, has a slow to epithelize left BKA area adjacent to his incision. Wife requested refill of NTG patch to dorsum of right foot daily as this seems to help capillary perfusion; this was reordered. Dr. Bridgett Larsson spoke with pt and wife and and examined pt; see Plan   PLAN:  Based on the patient's vascular studies and examination, pt will be scheduled for arteriogram by Dr. Bridgett Larsson on 10/30/15, using CO2 and contrast, right leg run off, possible right leg intervention.   I discussed in depth with the patient the nature of atherosclerosis, and emphasized the importance of maximal medical management including strict control of blood pressure, blood glucose, and lipid levels, obtaining regular exercise, and continued cessation of smoking.  The patient is aware that without maximal medical management the underlying atherosclerotic disease process will progress, limiting the benefit of any  interventions.  The patient was given information about PAD including signs, symptoms, treatment, what symptoms should prompt the patient to seek immediate medical care, and risk reduction measures to take.  Clemon Chambers, RN, MSN, FNP-C [facility] Phone: [phone]  Clinic MD: Bridgett Larsson  10/17/2015 3:21 PM

## 2015-10-23 ENCOUNTER — Other Ambulatory Visit: Payer: Self-pay | Admitting: *Deleted

## 2015-10-23 ENCOUNTER — Encounter: Payer: Self-pay | Admitting: *Deleted

## 2015-10-24 ENCOUNTER — Other Ambulatory Visit: Payer: Self-pay | Admitting: Nurse Practitioner

## 2015-10-30 ENCOUNTER — Ambulatory Visit (HOSPITAL_COMMUNITY)
Admission: RE | Admit: 2015-10-30 | Discharge: 2015-10-30 | Disposition: A | Discharge status: Home / Self Care | Payer: Medicare Other | Source: Ambulatory Visit | Attending: Vascular Surgery | Admitting: Vascular Surgery

## 2015-10-30 ENCOUNTER — Other Ambulatory Visit: Payer: Self-pay | Admitting: *Deleted

## 2015-10-30 ENCOUNTER — Encounter (HOSPITAL_COMMUNITY)
Admission: RE | Disposition: A | Discharge status: Home / Self Care | Payer: Self-pay | Source: Ambulatory Visit | Attending: Vascular Surgery

## 2015-10-30 DIAGNOSIS — I2510 Atherosclerotic heart disease of native coronary artery without angina pectoris: Secondary | ICD-10-CM | POA: Diagnosis not present

## 2015-10-30 DIAGNOSIS — D869 Sarcoidosis, unspecified: Secondary | ICD-10-CM | POA: Diagnosis not present

## 2015-10-30 DIAGNOSIS — I7025 Atherosclerosis of native arteries of other extremities with ulceration: Secondary | ICD-10-CM | POA: Diagnosis present

## 2015-10-30 DIAGNOSIS — Z79899 Other long term (current) drug therapy: Secondary | ICD-10-CM | POA: Diagnosis not present

## 2015-10-30 DIAGNOSIS — E11319 Type 2 diabetes mellitus with unspecified diabetic retinopathy without macular edema: Secondary | ICD-10-CM | POA: Diagnosis not present

## 2015-10-30 DIAGNOSIS — I70261 Atherosclerosis of native arteries of extremities with gangrene, right leg: Secondary | ICD-10-CM | POA: Insufficient documentation

## 2015-10-30 DIAGNOSIS — G4730 Sleep apnea, unspecified: Secondary | ICD-10-CM | POA: Diagnosis not present

## 2015-10-30 DIAGNOSIS — N186 End stage renal disease: Secondary | ICD-10-CM | POA: Insufficient documentation

## 2015-10-30 DIAGNOSIS — E1122 Type 2 diabetes mellitus with diabetic chronic kidney disease: Secondary | ICD-10-CM | POA: Insufficient documentation

## 2015-10-30 DIAGNOSIS — I509 Heart failure, unspecified: Secondary | ICD-10-CM | POA: Insufficient documentation

## 2015-10-30 DIAGNOSIS — K219 Gastro-esophageal reflux disease without esophagitis: Secondary | ICD-10-CM | POA: Insufficient documentation

## 2015-10-30 DIAGNOSIS — I132 Hypertensive heart and chronic kidney disease with heart failure and with stage 5 chronic kidney disease, or end stage renal disease: Secondary | ICD-10-CM | POA: Insufficient documentation

## 2015-10-30 DIAGNOSIS — Z794 Long term (current) use of insulin: Secondary | ICD-10-CM | POA: Insufficient documentation

## 2015-10-30 DIAGNOSIS — L97519 Non-pressure chronic ulcer of other part of right foot with unspecified severity: Secondary | ICD-10-CM | POA: Diagnosis present

## 2015-10-30 DIAGNOSIS — E669 Obesity, unspecified: Secondary | ICD-10-CM | POA: Diagnosis not present

## 2015-10-30 DIAGNOSIS — E039 Hypothyroidism, unspecified: Secondary | ICD-10-CM | POA: Insufficient documentation

## 2015-10-30 DIAGNOSIS — Z6833 Body mass index (BMI) 33.0-33.9, adult: Secondary | ICD-10-CM | POA: Diagnosis not present

## 2015-10-30 DIAGNOSIS — Z951 Presence of aortocoronary bypass graft: Secondary | ICD-10-CM | POA: Insufficient documentation

## 2015-10-30 DIAGNOSIS — E1140 Type 2 diabetes mellitus with diabetic neuropathy, unspecified: Secondary | ICD-10-CM | POA: Diagnosis not present

## 2015-10-30 DIAGNOSIS — Z85820 Personal history of malignant melanoma of skin: Secondary | ICD-10-CM | POA: Diagnosis not present

## 2015-10-30 DIAGNOSIS — Z992 Dependence on renal dialysis: Secondary | ICD-10-CM | POA: Insufficient documentation

## 2015-10-30 DIAGNOSIS — M1A9XX0 Chronic gout, unspecified, without tophus (tophi): Secondary | ICD-10-CM | POA: Insufficient documentation

## 2015-10-30 DIAGNOSIS — Z89512 Acquired absence of left leg below knee: Secondary | ICD-10-CM | POA: Diagnosis not present

## 2015-10-30 DIAGNOSIS — Z7982 Long term (current) use of aspirin: Secondary | ICD-10-CM | POA: Diagnosis not present

## 2015-10-30 DIAGNOSIS — I70235 Atherosclerosis of native arteries of right leg with ulceration of other part of foot: Secondary | ICD-10-CM | POA: Diagnosis not present

## 2015-10-30 DIAGNOSIS — I739 Peripheral vascular disease, unspecified: Secondary | ICD-10-CM

## 2015-10-30 HISTORY — PX: PERIPHERAL VASCULAR CATHETERIZATION: SHX172C

## 2015-10-30 HISTORY — PX: LOWER EXTREMITY ANGIOGRAM: SHX5508

## 2015-10-30 LAB — POCT I-STAT, CHEM 8
BUN: 46 mg/dL — ABNORMAL HIGH (ref 6–20)
CREATININE: 6.5 mg/dL — AB (ref 0.61–1.24)
Calcium, Ion: 1.06 mmol/L — ABNORMAL LOW (ref 1.13–1.30)
Chloride: 96 mmol/L — ABNORMAL LOW (ref 101–111)
GLUCOSE: 119 mg/dL — AB (ref 65–99)
HCT: 34 % — ABNORMAL LOW (ref 39.0–52.0)
HEMOGLOBIN: 11.6 g/dL — AB (ref 13.0–17.0)
POTASSIUM: 3.8 mmol/L (ref 3.5–5.1)
Sodium: 136 mmol/L (ref 135–145)
TCO2: 28 mmol/L (ref 0–100)

## 2015-10-30 SURGERY — ABDOMINAL AORTOGRAM
Laterality: Right

## 2015-10-30 MED ORDER — MORPHINE SULFATE (PF) 2 MG/ML IV SOLN: 2.0000 mg | INTRAVENOUS | Status: DC | PRN

## 2015-10-30 MED ORDER — ACETAMINOPHEN 325 MG PO TABS: 650.0000 mg | ORAL_TABLET | ORAL | Status: DC | PRN

## 2015-10-30 MED ORDER — HEPARIN (PORCINE) IN NACL 2-0.9 UNIT/ML-% IJ SOLN
INTRAMUSCULAR | Status: AC
  Filled 2015-10-30: qty 1000

## 2015-10-30 MED ORDER — FENTANYL CITRATE (PF) 100 MCG/2ML IJ SOLN
INTRAMUSCULAR | Status: DC | PRN
  Administered 2015-10-30: 50 ug via INTRAVENOUS

## 2015-10-30 MED ORDER — MIDAZOLAM HCL 2 MG/2ML IJ SOLN
INTRAMUSCULAR | Status: AC
  Filled 2015-10-30: qty 2

## 2015-10-30 MED ORDER — IODIXANOL 320 MG/ML IV SOLN
INTRAVENOUS | Status: DC | PRN
  Administered 2015-10-30: 75 mL via INTRA_ARTERIAL

## 2015-10-30 MED ORDER — SODIUM CHLORIDE 0.9% FLUSH: 3.0000 mL | Freq: Two times a day (BID) | INTRAVENOUS | Status: DC

## 2015-10-30 MED ORDER — HEPARIN (PORCINE) IN NACL 2-0.9 UNIT/ML-% IJ SOLN
INTRAMUSCULAR | Status: DC | PRN
  Administered 2015-10-30: 1000 mL

## 2015-10-30 MED ORDER — LIDOCAINE HCL (PF) 1 % IJ SOLN
INTRAMUSCULAR | Status: AC
  Filled 2015-10-30: qty 30

## 2015-10-30 MED ORDER — SODIUM CHLORIDE 0.9% FLUSH: 3.0000 mL | INTRAVENOUS | Status: DC | PRN

## 2015-10-30 MED ORDER — MIDAZOLAM HCL 2 MG/2ML IJ SOLN
INTRAMUSCULAR | Status: DC | PRN
  Administered 2015-10-30: 1 mg via INTRAVENOUS

## 2015-10-30 MED ORDER — SODIUM CHLORIDE 0.9 % IV SOLN: 250.0000 mL | INTRAVENOUS | Status: DC | PRN

## 2015-10-30 MED ORDER — FENTANYL CITRATE (PF) 100 MCG/2ML IJ SOLN
INTRAMUSCULAR | Status: AC
  Filled 2015-10-30: qty 2

## 2015-10-30 MED ORDER — SODIUM CHLORIDE 0.9 % IV SOLN
INTRAVENOUS | Status: DC
  Administered 2015-10-30: 12:00:00 via INTRAVENOUS

## 2015-10-30 MED ORDER — OXYCODONE-ACETAMINOPHEN 5-325 MG PO TABS: 1.0000 | ORAL_TABLET | Freq: Four times a day (QID) | ORAL | Status: DC | PRN

## 2015-10-30 MED ORDER — LIDOCAINE HCL (PF) 1 % IJ SOLN
INTRAMUSCULAR | Status: DC | PRN
  Administered 2015-10-30: 20 mL via SUBCUTANEOUS

## 2015-10-30 SURGICAL SUPPLY — 13 items
CATH OMNI FLUSH 5F 65CM (CATHETERS) ×4
CATH SOFT-VU 4F 65 STRAIGHT (CATHETERS) ×2
CATH SOFT-VU STRAIGHT 4F 65CM (CATHETERS) ×2
CATH STRAIGHT 5FR 65CM (CATHETERS) ×4
COVER PRB 48X5XTLSCP FOLD TPE (BAG) ×2
COVER PROBE 5X48 (BAG) ×2
KIT MICROINTRODUCER STIFF 5F (SHEATH) ×4
KIT PV (KITS) ×4
SHEATH PINNACLE 5F 10CM (SHEATH) ×4
SYR MEDRAD MARK V 150ML (SYRINGE) ×4
TRANSDUCER W/STOPCOCK (MISCELLANEOUS) ×4
TRAY PV CATH (CUSTOM PROCEDURE TRAY) ×4
WIRE BENTSON .035X145CM (WIRE) ×4

## 2015-10-30 NOTE — Interval H&P Note (Signed)
[facility] [place]  History and Physical Update  The patient was interviewed and re-examined.  The patient's previous History and Physical has been reviewed and is unchanged from my NP's consult.  There is no change in the plan of care: Aortogram, right leg runoff.   I discussed with the patient the nature of angiographic procedures, especially the limited patencies of any endovascular intervention.    The patient is aware of that the risks of an angiographic procedure include but are not limited to: bleeding, infection, access site complications, renal failure, embolization, rupture of vessel, dissection, arteriovenous fistula, possible need for emergent surgical intervention, possible need for surgical procedures to treat the patient's pathology, anaphylactic reaction to contrast, and stroke and death.    The patient is aware of the risks and agrees to proceed.  Adele Barthel, MD [facility] [place] Office: [phone] Pager: [phone]  10/30/2015, 12:09 PM

## 2015-10-30 NOTE — H&P (View-Only) (Signed)
[facility] [place] HISTORY AND PHYSICAL -PAD  History of Present Illness Jack Chung is a 75 y.o. male patient of Dr. Bridgett Larsson who presents with chief complaint: right great toe ulcer and more right foot ulcers.   He returns today for evaluation of the above ulcers. He denies any rest pain. He was hospitalized in April 2017 for hypoglycemia. Pt saw Dr. Brigitte Pulse, his PCP, on 10/10/15. Wife states Dr. Brigitte Pulse wonders if pt would [facility].  Wife is applying daily gentamycin cream to the ulcers on his right great toe, lateral aspect right foot.  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 He stands with physical therapy, but otherwise he gets around in a wheelchair. He denies pain in his lower extremities.  He has ESRD and does home peritoneal dialysis with his wife's help. Wife states that even though he has a functioning AV fistula in his right upper arm, he cannot tolerate hemodialysis as his blood pressure drops too low. He states he was exposed to [employer] on a daily basis, working on the equipment that came from [place]   Pt Diabetic: Yes, 7.9 A1C on 09/22/15 (review of records) Pt smoker: non-smoker  Pt meds include: Statin :No, wife states his liver enzymes elevate with statin use Betablocker: No ASA: Yes Other anticoagulants/antiplatelets: no   Past Medical History  Diagnosis Date  . Hypertension   . Hypothyroidism   . CHF (congestive heart failure) ([place])   . Anemia   . Blind left eye   . Sleep apnea     not on CPAP  . Coronary artery disease     CABG x 11 Jun 2009.  MRSA infections of incsions  . Arthritis   . Sarcoidosis ([place])     Hx:of  . Diabetic retinopathy ([place])     Hx: of bilateral  . Edema   . Hyperlipidemia   . 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 Atherosclerosis of native arteries of the extremities, unspecified   . Eczema     Hx: of  . Insomnia   . Seasonal allergies   . History of blood transfusion     with heart surgery  . Coronary atherosclerosis of native coronary artery   . Atherosclerosis of native arteries of the extremities, unspecified   . Pneumonia   . Diabetes mellitus     type 2  . ESRD on dialysis [facility])     peritoneal dialysis - 5 times a time 9a, 12n, 3p, 6p, 9p  . GERD (gastroesophageal reflux disease)   . Neuropathy ([place])   . Cancer ([place])     skin cancer - melanoma on back    Social History Social History  Substance Use Topics  . Smoking status: Never Smoker   . Smokeless tobacco: Never Used  . Alcohol Use: No    Family History Family History  Problem Relation Age of Onset  . COPD Mother   . COPD Father     Past Surgical History  Procedure Laterality Date  . Cardiac surgery      total of 6 surgeries, 5 related to mrsa  . Coronary artery bypass graft  06/2009  . Cataract surgery      Hx: of  .  Debridements      Hx: of secondary to MRSA  . Av fistula placement Right 10/11/2012    Procedure: ARTERIOVENOUS (AV) FISTULA CREATION;  Surgeon: Rosetta Posner, MD;  Location: [place];  Service: Vascular;  Laterality: Right;  . Insertion of dialysis catheter Left   . Capd insertion N/A 11/28/2012    Procedure: LAPAROSCOPIC PERITONEAL DIALYSIS CATHETER PLACEMENT;  Surgeon: Ralene Ok, MD;  Location: [place];  Service: General;  Laterality: N/A;  . Left heart catheterization with coronary/graft angiogram N/A 01/31/2014    Procedure: LEFT HEART CATHETERIZATION WITH Beatrix Fetters;  Surgeon: Troy Sine, MD;  Location: [facility] CATH LAB;  Service: Cardiovascular;  Laterality: N/A;  . Peripheral vascular catheterization N/A 04/28/2015    Procedure: Abdominal Aortogram w/Lower Extremity;  Surgeon: Conrad Orchard, MD;  Location: [place] CV LAB;  Service:  Cardiovascular;  Laterality: N/A;  . Amputation Left 04/30/2015    Procedure: 1st Ray Amputation Left Foot;  Surgeon: Newt Minion, MD;  Location: [place];  Service: Orthopedics;  Laterality: Left;  . Esophagogastroduodenoscopy N/A 05/20/2015    Procedure: ESOPHAGOGASTRODUODENOSCOPY (EGD);  Surgeon: Wilford Corner, MD;  Location: [facility] ENDOSCOPY;  Service: Endoscopy;  Laterality: N/A;  Possible esophageal dilatation  . Eye surgery Bilateral     cataract surgery with lens implants  . Amputation Left 06/13/2015    Procedure: Left Below Knee Amputation;  Surgeon: Newt Minion, MD;  Location: [place];  Service: Orthopedics;  Laterality: Left;    Allergies  Allergen Reactions  . Flu Virus Vaccine     Gets the flu, patient has bad reaction to the flu vaccine.  . Lipitor [Atorvastatin]     Leg cramps    Current Outpatient Prescriptions  Medication Sig Dispense Refill  . allopurinol (ZYLOPRIM) 100 MG tablet Take 100 mg by mouth daily.    Marland Kitchen aspirin 325 MG EC tablet Take 325 mg by mouth daily.    . calcium carbonate (TUMS - DOSED IN MG ELEMENTAL CALCIUM) 500 MG chewable tablet Chew 2 tablets by mouth 3 (three) times daily with meals.     . cetirizine (ZYRTEC) 10 MG tablet Take 10 mg by mouth daily as needed for allergies.     Marland Kitchen gentamicin cream (GARAMYCIN) 0.1 % APPLY TO THE AFFECTED AREAS AS DIRECTED.  3  . insulin aspart (NOVOLOG) 100 UNIT/ML injection Inject 10 Units into the skin 3 (three) times daily with meals. To give 10 units plus SSI -Take none if blood sugar < 150, 151-200 2 units, 201-250 4 units, 251-300 6 units, 301-350 8 units    . Insulin Glargine (LANTUS) 100 UNIT/ML Solostar Pen Inject 20 units into the skin daily. Dx 250.00 15 mL 6  . Lactobacillus (ACIDOPHILUS PROBIOTIC PO) Take 2 tablets by mouth daily.    Marland Kitchen lactulose (CHRONULAC) 10 GM/15ML solution Take 10 g by mouth every 4 (four) hours as needed for mild constipation.    Marland Kitchen levothyroxine (SYNTHROID, LEVOTHROID) 112 MCG tablet  LABS/APPOINTMENT OVERDUE, 1 by mouth daily in the morning 30 minutes before breakfast for Thyroid 15 tablet 0  . loperamide (IMODIUM) 2 MG capsule Take 2 mg by mouth daily as needed for diarrhea or loose stools.    . metoCLOPramide (REGLAN) 5 MG tablet Take 2 tablets (10 mg total) by mouth 3 (three) times daily before meals. 180 tablet 0  . midodrine (PROAMATINE) 10 MG tablet Take 1 tablet (10 mg total) by mouth 3 (three) times daily. (Patient taking differently: Take 10 mg  by mouth every morning. ) 270 tablet 3  . multivitamin (RENA-VIT) TABS tablet Take 1 tablet by mouth daily.    . nitroGLYCERIN (NITRODUR - DOSED IN MG/24 HR) 0.2 mg/hr patch Place 0.2 mg onto the skin daily.   0  . Nutritional Supplements (FEEDING SUPPLEMENT, NEPRO CARB STEADY,) LIQD Drink 1 can by mouth three times a day between meals    . omeprazole (PRILOSEC) 10 MG capsule Take 10 mg by mouth daily. Reported on 08/01/2015    . oxyCODONE-acetaminophen (PERCOCET/ROXICET) 5-325 MG tablet Take one tablet by mouth every 6 hours as needed for moderate pain 120 tablet 0  . potassium chloride SA (K-DUR,KLOR-CON) 20 MEQ tablet Take 20 mEq by mouth daily.     No current facility-administered medications for this visit.    ROS: See HPI for pertinent positives and negatives.   Physical Examination  Filed Vitals:   10/17/15 1500  BP: 138/69  Pulse: 80  Temp: 97.2 F (36.2 C)  TempSrc: Oral  Height: 5\' 9"  (1.753 m)  Weight: 230 lb (104.327 kg)  SpO2: 100%   Body mass index is 33.95 kg/(m^2).  General: A&O x 3, WDWN, obese male seated in w/c  Eyes: PERRLA  Pulmonary: Sym exp, good air movt, CTAB, no rales, rhonchi, & wheezing  Cardiac: RRR, Nl S1, S2, no detected murmur  Vascular: Vessel Right Left  Radial Not Palpable Palpable  Brachial AV fistula present with strong palpable thrill Palpable  Carotid Palpable, without bruit Palpable, without bruit  Aorta Not palpable N/A  Femoral  Palpable Palpable  Popliteal Not palpable Not palpable  PT Not Palpable BKA  DP Not Palpable BKA   Gastrointestinal: soft, NTND, no G/R, no HSM, no masses, no CVAT B  Musculoskeletal: M/S 5/5 throughout , L BKA with granulating area posterior to incision, R great toe with dry gangrene, base of right great toe with 1.5 cm diameter shallow dry gangrene lesion; another round dry gangrene lesion at lateral aspect and base of right 5th toe, about 1 cm in diameter. Small dry shallow dark lesion at the tip of the right 3rd toe. Right foot and lower leg with 1-2+ pitting and non pitting edema.  Neurologic: Pain and light touch intact in extremities , Motor exam as listed above         Non-Invasive Vascular Imaging: DATE: 10/17/2015 none  ASSESSMENT: KALLIN NEYHART is a 75 y.o. male who presents s/p L BKA, R SFA stenosis 50-75%, additional areas of dry gangrene on right foot and toes. Pt denies pain in his lower extremities. He has developed more dry gangrene ischemic areas on his right toes and foot, has a slow to epithelize left BKA area adjacent to his incision. Wife requested refill of NTG patch to dorsum of right foot daily as this seems to help capillary perfusion; this was reordered. Dr. Bridgett Larsson spoke with pt and wife and and examined pt; see Plan   PLAN:  Based on the patient's vascular studies and examination, pt will be scheduled for arteriogram by Dr. Bridgett Larsson on 10/30/15, using CO2 and contrast, right leg run off, possible right leg intervention.   I discussed in depth with the patient the nature of atherosclerosis, and emphasized the importance of maximal medical management including strict control of blood pressure, blood glucose, and lipid levels, obtaining regular exercise, and continued cessation of smoking.  The patient is aware that without maximal medical management the underlying atherosclerotic disease process will progress, limiting the benefit of any  interventions.  The patient was given information about PAD including signs, symptoms, treatment, what symptoms should prompt the patient to seek immediate medical care, and risk reduction measures to take.  Clemon Chambers, RN, MSN, FNP-C [facility] Phone: [phone]  Clinic MD: Bridgett Larsson  10/17/2015 3:21 PM

## 2015-10-30 NOTE — Op Note (Signed)
OPERATIVE NOTE   PROCEDURE: 1.  Left common femoral artery cannulation under ultrasound guidance 2.  Placement of catheter in aorta 3.  Aortogram 4.  Conscious sedation for 24 minutes 5.  Second order arterial selection 6.  Right leg runoff  PRE-OPERATIVE DIAGNOSIS: right foot ulcers  POST-OPERATIVE DIAGNOSIS: same as above   SURGEON: Adele Barthel, MD  ANESTHESIA: conscious sedation  ESTIMATED BLOOD LOSS: 50 cc  CONTRAST: 75 cc  FINDING(S):  Aorta: patent  Superior mesenteric artery: patent Celiac artery: patent   Right Left  RA patent patent  CIA patent patent  EIA patent patent  IIA patent patent  CFA patent   SFA Patent, calcified throughout, distal stenosis < 50% (4.5 mm lumen)   PFA patent   Pop patent   Trif patent   AT occluded   Pero Patent, proximal stenosis 50-75% (2 mm lumen), fast transit time   PT Occluded proximally, reconstitutes distally from peroneal artery, calcified distally    SPECIMEN(S):  none  INDICATIONS:   Jack Chung is a 75 y.o. male who presents with poorly healing right foot ulcers.  The patient presents for: aortogram and right leg runoff.  I discussed with the patient the nature of angiographic procedures, especially the limited patencies of any endovascular intervention.  The patient is aware of that the risks of an angiographic procedure include but are not limited to: bleeding, infection, access site complications, renal failure, embolization, rupture of vessel, dissection, possible need for emergent surgical intervention, possible need for surgical procedures to treat the patient's pathology, and stroke and death.  The patient is aware of the risks and agrees to proceed.  DESCRIPTION: After full informed consent was obtained from the patient, the patient was brought back to the angiography suite.  The patient was placed supine upon the angiography table and connected to cardiopulmonary monitoring equipment.  The patient was then  given conscious sedation, the amounts of which are documented in the patient's chart.  A circulating radiologic technician maintained continuous monitoring of the patient's cardiopulmonary status.  Additionally, the control room radiologic technician provided backup monitoring throughout the procedure.  The patient was prepped and drape in the standard fashion for an angiographic procedure.  At this point, attention was turned to the left groin.  Under ultrasound guidance, the subcutaneous tissue surrounding the left common femoral artery was anesthesized with 1% lidocaine with epinephrine.  The artery was then cannulated with a micropuncture needle.  The microwire was advanced into the iliac arterial system.  The needle was exchanged for a microsheath, which was loaded into the common femoral artery over the wire.  The microwire was exchanged for a Bentson wire which was advanced into the aorta.  The microsheath was then exchanged for a 5-Fr sheath which was loaded into the common femoral artery.  The Omniflush catheter was then loaded over the wire up to the level of T11.  The catheter was connected to the power injector circuit.  After de-airring and de-clotting the circuit, a power injector aortogram was completed.  The findings are listed above.  The Bentson wire was replaced in the catheter, and using the [facility] and Omniflush catheter, the right common iliac artery was selected.  The wire was  advanced into the external iliac artery.  The catheter would not cross the aortic bifurcation.  This was exchanged for a straight catheter.  I advanced this into the right external iliac artery.  The wire was removed and the catheter connected to the  power injector circuit.  An automated right leg runoff was completed.  The findings are listed above.  Based on the images, I felt that there was rapid blood flow to the flow despite the right distal SFA stenosis and the proximal peroneal artery stenosis.  It has been my  experience that endovascular interventions on 2 mm arteries fail to stay open.  As this is the only runoff to the right foot, he would need an amputation subsequently.  Will continue with wound care first and if this fails to obtain results, will come back for an orbital atherectomy and angioplasty on the right distal SFA and proximal peroneal artery.  I removed the catheter and then the sheath was aspirated.  No clots were present and the sheath was reloaded with heparinized saline.     COMPLICATIONS: none  CONDITION: stable   Adele Barthel, MD [facility] [place] Office: [phone] Pager: [phone]  10/30/2015, 1:21 PM

## 2015-10-30 NOTE — Discharge Instructions (Signed)
Angiogram, Care After °Refer to this sheet in the next few weeks. These instructions provide you with information about caring for yourself after your procedure. Your health care provider may also give you more specific instructions. Your treatment has been planned according to current medical practices, but problems sometimes occur. Call your health care provider if you have any problems or questions after your procedure. °WHAT TO EXPECT AFTER THE PROCEDURE °After your procedure, it is typical to have the following: °· Bruising at the catheter insertion site that usually fades within 1-2 weeks. °· Blood collecting in the tissue (hematoma) that may be painful to the touch. It should usually decrease in size and tenderness within 1-2 weeks. °HOME CARE INSTRUCTIONS °· Take medicines only as directed by your health care provider. °· You may shower 24-48 hours after the procedure or as directed by your health care provider. Remove the bandage (dressing) and gently wash the site with plain soap and water. Pat the area dry with a clean towel. Do not rub the site, because this may cause bleeding. °· Do not take baths, swim, or use a hot tub until your health care provider approves. °· Check your insertion site every day for redness, swelling, or drainage. °· Do not apply powder or lotion to the site. °· Do not lift over 10 lb (4.5 kg) for 5 days after your procedure or as directed by your health care provider. °· Ask your health care provider when it is okay to: °¨ Return to work or school. °¨ Resume usual physical activities or sports. °¨ Resume sexual activity. °· Do not drive home if you are discharged the same day as the procedure. Have someone else drive you. °· You may drive 24 hours after the procedure unless otherwise instructed by your health care provider. °· Do not operate machinery or power tools for 24 hours after the procedure or as directed by your health care provider. °· If your procedure was done as an  outpatient procedure, which means that you went home the same day as your procedure, a responsible adult should be with you for the first 24 hours after you arrive home. °· Keep all follow-up visits as directed by your health care provider. This is important. °SEEK MEDICAL CARE IF: °· You have a fever. °· You have chills. °· You have increased bleeding from the catheter insertion site. Hold pressure on the site. °SEEK IMMEDIATE MEDICAL CARE IF: °· You have unusual pain at the catheter insertion site. °· You have redness, warmth, or swelling at the catheter insertion site. °· You have drainage (other than a small amount of blood on the dressing) from the catheter insertion site. °· The catheter insertion site is bleeding, and the bleeding does not stop after 30 minutes of holding steady pressure on the site. °· The area near or just beyond the catheter insertion site becomes pale, cool, tingly, or numb. °  °This information is not intended to replace advice given to you by your health care provider. Make sure you discuss any questions you have with your health care provider. °  °Document Released: 12/10/2004 Document Revised: 06/14/2014 Document Reviewed: 10/25/2012 °Elsevier Interactive Patient Education ©2016 [employer] ° °

## 2015-10-31 ENCOUNTER — Telehealth: Payer: Self-pay | Admitting: Vascular Surgery

## 2015-10-31 ENCOUNTER — Encounter (HOSPITAL_COMMUNITY): Payer: Self-pay | Admitting: Vascular Surgery

## 2015-10-31 NOTE — Telephone Encounter (Signed)
Sched wound care appt at [facility] 6/12 at 9:15. Sched [facility] appt 6/23 at 10:30. Spoke to pt's wife.

## 2015-10-31 NOTE — Telephone Encounter (Signed)
-----   Message from Mena Goes, RN sent at 10/30/2015  2:22 PM EDT ----- Regarding: schedule   ----- Message -----    From: Conrad Tonsina, MD    Sent: 10/30/2015   1:29 PM      To: [address]  Jack Chung Jack Chung July 30, 1940  PROCEDURE: 1.  Left common femoral artery cannulation under ultrasound guidance 2.  Placement of catheter in aorta 3.  Aortogram 4.  Conscious sedation for 24 minutes 5.  Second order arterial selection 6.  Right leg runoff  Follow-up: 4 weeks  Orders(s) for follow-up:  Pt needs referral to [facility] wound care clinic (if not already arranged)

## 2015-11-16 ENCOUNTER — Other Ambulatory Visit: Payer: Self-pay | Admitting: Nurse Practitioner

## 2015-11-17 ENCOUNTER — Encounter (HOSPITAL_BASED_OUTPATIENT_CLINIC_OR_DEPARTMENT_OTHER): Payer: Medicare Other | Attending: Internal Medicine

## 2015-11-17 DIAGNOSIS — Z79899 Other long term (current) drug therapy: Secondary | ICD-10-CM | POA: Insufficient documentation

## 2015-11-17 DIAGNOSIS — Z89512 Acquired absence of left leg below knee: Secondary | ICD-10-CM | POA: Insufficient documentation

## 2015-11-17 DIAGNOSIS — E11622 Type 2 diabetes mellitus with other skin ulcer: Secondary | ICD-10-CM | POA: Diagnosis not present

## 2015-11-17 DIAGNOSIS — I2510 Atherosclerotic heart disease of native coronary artery without angina pectoris: Secondary | ICD-10-CM | POA: Insufficient documentation

## 2015-11-17 DIAGNOSIS — Z992 Dependence on renal dialysis: Secondary | ICD-10-CM | POA: Diagnosis not present

## 2015-11-17 DIAGNOSIS — L97311 Non-pressure chronic ulcer of right ankle limited to breakdown of skin: Secondary | ICD-10-CM | POA: Diagnosis not present

## 2015-11-17 DIAGNOSIS — N189 Chronic kidney disease, unspecified: Secondary | ICD-10-CM | POA: Insufficient documentation

## 2015-11-17 DIAGNOSIS — E1152 Type 2 diabetes mellitus with diabetic peripheral angiopathy with gangrene: Secondary | ICD-10-CM | POA: Insufficient documentation

## 2015-11-17 DIAGNOSIS — Z794 Long term (current) use of insulin: Secondary | ICD-10-CM | POA: Insufficient documentation

## 2015-11-17 DIAGNOSIS — M109 Gout, unspecified: Secondary | ICD-10-CM | POA: Insufficient documentation

## 2015-11-17 DIAGNOSIS — L97514 Non-pressure chronic ulcer of other part of right foot with necrosis of bone: Secondary | ICD-10-CM | POA: Insufficient documentation

## 2015-11-17 DIAGNOSIS — M069 Rheumatoid arthritis, unspecified: Secondary | ICD-10-CM | POA: Diagnosis not present

## 2015-11-17 DIAGNOSIS — I7300 Raynaud's syndrome without gangrene: Secondary | ICD-10-CM | POA: Insufficient documentation

## 2015-11-17 DIAGNOSIS — E1122 Type 2 diabetes mellitus with diabetic chronic kidney disease: Secondary | ICD-10-CM | POA: Diagnosis not present

## 2015-11-17 DIAGNOSIS — E11621 Type 2 diabetes mellitus with foot ulcer: Secondary | ICD-10-CM | POA: Diagnosis present

## 2015-11-17 DIAGNOSIS — B9620 Unspecified Escherichia coli [E. coli] as the cause of diseases classified elsewhere: Secondary | ICD-10-CM | POA: Insufficient documentation

## 2015-11-17 DIAGNOSIS — L97511 Non-pressure chronic ulcer of other part of right foot limited to breakdown of skin: Secondary | ICD-10-CM | POA: Insufficient documentation

## 2015-11-17 DIAGNOSIS — E1142 Type 2 diabetes mellitus with diabetic polyneuropathy: Secondary | ICD-10-CM | POA: Insufficient documentation

## 2015-11-17 DIAGNOSIS — Z7982 Long term (current) use of aspirin: Secondary | ICD-10-CM | POA: Insufficient documentation

## 2015-11-20 ENCOUNTER — Encounter: Payer: Self-pay | Admitting: Vascular Surgery

## 2015-11-24 DIAGNOSIS — E11621 Type 2 diabetes mellitus with foot ulcer: Secondary | ICD-10-CM | POA: Diagnosis not present

## 2015-11-25 DIAGNOSIS — E11621 Type 2 diabetes mellitus with foot ulcer: Secondary | ICD-10-CM | POA: Diagnosis not present

## 2015-11-25 LAB — GLUCOSE, CAPILLARY: GLUCOSE-CAPILLARY: 151 mg/dL — AB (ref 65–99)

## 2015-11-26 DIAGNOSIS — E11621 Type 2 diabetes mellitus with foot ulcer: Secondary | ICD-10-CM | POA: Diagnosis not present

## 2015-11-26 LAB — GLUCOSE, CAPILLARY
GLUCOSE-CAPILLARY: 195 mg/dL — AB (ref 65–99)
Glucose-Capillary: 232 mg/dL — ABNORMAL HIGH (ref 65–99)

## 2015-11-27 DIAGNOSIS — E11621 Type 2 diabetes mellitus with foot ulcer: Secondary | ICD-10-CM | POA: Diagnosis not present

## 2015-11-27 LAB — GLUCOSE, CAPILLARY
GLUCOSE-CAPILLARY: 181 mg/dL — AB (ref 65–99)
GLUCOSE-CAPILLARY: 243 mg/dL — AB (ref 65–99)

## 2015-11-28 ENCOUNTER — Encounter: Payer: Self-pay | Admitting: Family

## 2015-11-28 ENCOUNTER — Ambulatory Visit: Payer: Medicare Other | Admitting: Vascular Surgery

## 2015-11-28 DIAGNOSIS — E11621 Type 2 diabetes mellitus with foot ulcer: Secondary | ICD-10-CM | POA: Diagnosis not present

## 2015-11-28 LAB — GLUCOSE, CAPILLARY: Glucose-Capillary: 184 mg/dL — ABNORMAL HIGH (ref 65–99)

## 2015-12-01 ENCOUNTER — Encounter (HOSPITAL_COMMUNITY): Payer: Self-pay

## 2015-12-01 ENCOUNTER — Inpatient Hospital Stay (HOSPITAL_COMMUNITY)
Admission: EM | Admit: 2015-12-01 | Discharge: 2015-12-07 | DRG: 270 | Disposition: A | Discharge status: Home / Self Care | Payer: Medicare Other | Attending: Internal Medicine | Admitting: Internal Medicine

## 2015-12-01 DIAGNOSIS — M79674 Pain in right toe(s): Secondary | ICD-10-CM | POA: Diagnosis present

## 2015-12-01 DIAGNOSIS — D62 Acute posthemorrhagic anemia: Secondary | ICD-10-CM | POA: Diagnosis not present

## 2015-12-01 DIAGNOSIS — K219 Gastro-esophageal reflux disease without esophagitis: Secondary | ICD-10-CM | POA: Diagnosis present

## 2015-12-01 DIAGNOSIS — I132 Hypertensive heart and chronic kidney disease with heart failure and with stage 5 chronic kidney disease, or end stage renal disease: Secondary | ICD-10-CM | POA: Diagnosis present

## 2015-12-01 DIAGNOSIS — Z992 Dependence on renal dialysis: Secondary | ICD-10-CM | POA: Diagnosis not present

## 2015-12-01 DIAGNOSIS — E46 Unspecified protein-calorie malnutrition: Secondary | ICD-10-CM | POA: Diagnosis present

## 2015-12-01 DIAGNOSIS — E11621 Type 2 diabetes mellitus with foot ulcer: Secondary | ICD-10-CM | POA: Diagnosis not present

## 2015-12-01 DIAGNOSIS — D631 Anemia in chronic kidney disease: Secondary | ICD-10-CM | POA: Diagnosis present

## 2015-12-01 DIAGNOSIS — M109 Gout, unspecified: Secondary | ICD-10-CM | POA: Diagnosis present

## 2015-12-01 DIAGNOSIS — E1122 Type 2 diabetes mellitus with diabetic chronic kidney disease: Secondary | ICD-10-CM | POA: Diagnosis present

## 2015-12-01 DIAGNOSIS — Z8614 Personal history of Methicillin resistant Staphylococcus aureus infection: Secondary | ICD-10-CM | POA: Diagnosis not present

## 2015-12-01 DIAGNOSIS — E039 Hypothyroidism, unspecified: Secondary | ICD-10-CM | POA: Diagnosis present

## 2015-12-01 DIAGNOSIS — N186 End stage renal disease: Secondary | ICD-10-CM | POA: Diagnosis present

## 2015-12-01 DIAGNOSIS — E876 Hypokalemia: Secondary | ICD-10-CM | POA: Diagnosis present

## 2015-12-01 DIAGNOSIS — Z7982 Long term (current) use of aspirin: Secondary | ICD-10-CM | POA: Diagnosis not present

## 2015-12-01 DIAGNOSIS — Z85820 Personal history of malignant melanoma of skin: Secondary | ICD-10-CM | POA: Diagnosis not present

## 2015-12-01 DIAGNOSIS — Z951 Presence of aortocoronary bypass graft: Secondary | ICD-10-CM | POA: Diagnosis not present

## 2015-12-01 DIAGNOSIS — Z91018 Allergy to other foods: Secondary | ICD-10-CM | POA: Diagnosis not present

## 2015-12-01 DIAGNOSIS — Z89512 Acquired absence of left leg below knee: Secondary | ICD-10-CM

## 2015-12-01 DIAGNOSIS — I96 Gangrene, not elsewhere classified: Secondary | ICD-10-CM | POA: Diagnosis not present

## 2015-12-01 DIAGNOSIS — D649 Anemia, unspecified: Secondary | ICD-10-CM | POA: Diagnosis not present

## 2015-12-01 DIAGNOSIS — M86171 Other acute osteomyelitis, right ankle and foot: Secondary | ICD-10-CM | POA: Diagnosis present

## 2015-12-01 DIAGNOSIS — E1152 Type 2 diabetes mellitus with diabetic peripheral angiopathy with gangrene: Secondary | ICD-10-CM | POA: Diagnosis present

## 2015-12-01 DIAGNOSIS — G4730 Sleep apnea, unspecified: Secondary | ICD-10-CM | POA: Diagnosis present

## 2015-12-01 DIAGNOSIS — H5442 Blindness, left eye, normal vision right eye: Secondary | ICD-10-CM | POA: Diagnosis present

## 2015-12-01 DIAGNOSIS — E11319 Type 2 diabetes mellitus with unspecified diabetic retinopathy without macular edema: Secondary | ICD-10-CM | POA: Diagnosis present

## 2015-12-01 DIAGNOSIS — E1169 Type 2 diabetes mellitus with other specified complication: Secondary | ICD-10-CM | POA: Diagnosis present

## 2015-12-01 DIAGNOSIS — Z794 Long term (current) use of insulin: Secondary | ICD-10-CM | POA: Diagnosis not present

## 2015-12-01 DIAGNOSIS — E778 Other disorders of glycoprotein metabolism: Secondary | ICD-10-CM | POA: Diagnosis present

## 2015-12-01 DIAGNOSIS — E1142 Type 2 diabetes mellitus with diabetic polyneuropathy: Secondary | ICD-10-CM | POA: Diagnosis present

## 2015-12-01 DIAGNOSIS — I2510 Atherosclerotic heart disease of native coronary artery without angina pectoris: Secondary | ICD-10-CM | POA: Diagnosis present

## 2015-12-01 DIAGNOSIS — E785 Hyperlipidemia, unspecified: Secondary | ICD-10-CM | POA: Diagnosis present

## 2015-12-01 DIAGNOSIS — Z6830 Body mass index (BMI) 30.0-30.9, adult: Secondary | ICD-10-CM

## 2015-12-01 DIAGNOSIS — Z91010 Allergy to peanuts: Secondary | ICD-10-CM

## 2015-12-01 DIAGNOSIS — L97509 Non-pressure chronic ulcer of other part of unspecified foot with unspecified severity: Secondary | ICD-10-CM | POA: Diagnosis not present

## 2015-12-01 DIAGNOSIS — L03115 Cellulitis of right lower limb: Secondary | ICD-10-CM | POA: Diagnosis present

## 2015-12-01 DIAGNOSIS — I509 Heart failure, unspecified: Secondary | ICD-10-CM | POA: Diagnosis present

## 2015-12-01 DIAGNOSIS — I70269 Atherosclerosis of native arteries of extremities with gangrene, unspecified extremity: Secondary | ICD-10-CM

## 2015-12-01 DIAGNOSIS — I959 Hypotension, unspecified: Secondary | ICD-10-CM | POA: Diagnosis present

## 2015-12-01 HISTORY — DX: Gangrene, not elsewhere classified: I96

## 2015-12-01 LAB — BASIC METABOLIC PANEL
Anion gap: 14 (ref 5–15)
BUN: 47 mg/dL — ABNORMAL HIGH (ref 6–20)
CALCIUM: 8.3 mg/dL — AB (ref 8.9–10.3)
CHLORIDE: 92 mmol/L — AB (ref 101–111)
CO2: 27 mmol/L (ref 22–32)
CREATININE: 6.43 mg/dL — AB (ref 0.61–1.24)
GFR calc non Af Amer: 8 mL/min — ABNORMAL LOW (ref >60)
GFR, EST AFRICAN AMERICAN: 9 mL/min — AB (ref >60)
Glucose, Bld: 52 mg/dL — ABNORMAL LOW (ref 65–99)
Potassium: 3.3 mmol/L — ABNORMAL LOW (ref 3.5–5.1)
SODIUM: 133 mmol/L — AB (ref 135–145)

## 2015-12-01 LAB — CBC WITH DIFFERENTIAL/PLATELET
BASOS ABS: 0 10*3/uL (ref 0.0–0.1)
BASOS PCT: 0 %
EOS ABS: 0.2 10*3/uL (ref 0.0–0.7)
Eosinophils Relative: 2 %
HCT: 34.8 % — ABNORMAL LOW (ref 39.0–52.0)
HEMOGLOBIN: 11 g/dL — AB (ref 13.0–17.0)
Lymphocytes Relative: 16 %
Lymphs Abs: 1.9 10*3/uL (ref 0.7–4.0)
MCH: 30.2 pg (ref 26.0–34.0)
MCHC: 31.6 g/dL (ref 30.0–36.0)
MCV: 95.6 fL (ref 78.0–100.0)
MONOS PCT: 9 %
Monocytes Absolute: 1.1 10*3/uL — ABNORMAL HIGH (ref 0.1–1.0)
NEUTROS PCT: 73 %
Neutro Abs: 9 10*3/uL — ABNORMAL HIGH (ref 1.7–7.7)
Platelets: 414 10*3/uL — ABNORMAL HIGH (ref 150–400)
RBC: 3.64 MIL/uL — ABNORMAL LOW (ref 4.22–5.81)
RDW: 14.4 % (ref 11.5–15.5)
WBC: 12.3 10*3/uL — ABNORMAL HIGH (ref 4.0–10.5)

## 2015-12-01 LAB — PREALBUMIN: PREALBUMIN: 8.6 mg/dL — AB (ref 18–38)

## 2015-12-01 LAB — GLUCOSE, CAPILLARY
GLUCOSE-CAPILLARY: 343 mg/dL — AB (ref 65–99)
GLUCOSE-CAPILLARY: 148 mg/dL — AB (ref 65–99)

## 2015-12-01 LAB — SEDIMENTATION RATE: Sed Rate: 127 mm/hr — ABNORMAL HIGH (ref 0–16)

## 2015-12-01 LAB — C-REACTIVE PROTEIN: CRP: 11.8 mg/dL — ABNORMAL HIGH (ref <1.0)

## 2015-12-01 LAB — CBG MONITORING, ED: Glucose-Capillary: 109 mg/dL — ABNORMAL HIGH (ref 65–99)

## 2015-12-01 MED ORDER — MIDODRINE HCL 5 MG PO TABS
10.0000 mg | ORAL_TABLET | Freq: Every day | ORAL | Status: DC
  Administered 2015-12-02 – 2015-12-07 (×6): 10 mg via ORAL
  Filled 2015-12-01 (×6): qty 2

## 2015-12-01 MED ORDER — PIPERACILLIN-TAZOBACTAM 3.375 G IVPB 30 MIN
3.3750 g | Freq: Once | INTRAVENOUS | Status: AC
  Administered 2015-12-01: 3.375 g via INTRAVENOUS
  Filled 2015-12-01: qty 50

## 2015-12-01 MED ORDER — ASPIRIN EC 325 MG PO TBEC
325.0000 mg | DELAYED_RELEASE_TABLET | Freq: Every day | ORAL | Status: DC
  Administered 2015-12-02 – 2015-12-07 (×5): 325 mg via ORAL
  Filled 2015-12-01 (×5): qty 1

## 2015-12-01 MED ORDER — ONDANSETRON HCL 4 MG/2ML IJ SOLN: 4.0000 mg | Freq: Four times a day (QID) | INTRAMUSCULAR | Status: DC | PRN

## 2015-12-01 MED ORDER — FLUCONAZOLE 100 MG PO TABS
100.0000 mg | ORAL_TABLET | Freq: Every day | ORAL | Status: AC
Start: 2015-12-02 — End: 2015-12-03
  Administered 2015-12-02 – 2015-12-03 (×2): 100 mg via ORAL
  Filled 2015-12-01 (×2): qty 1

## 2015-12-01 MED ORDER — LOPERAMIDE HCL 2 MG PO CAPS: 2.0000 mg | ORAL_CAPSULE | Freq: Every day | ORAL | Status: DC | PRN

## 2015-12-01 MED ORDER — INSULIN ASPART 100 UNIT/ML ~~LOC~~ SOLN: 0.0000 [IU] | Freq: Three times a day (TID) | SUBCUTANEOUS | Status: DC

## 2015-12-01 MED ORDER — POTASSIUM CHLORIDE CRYS ER 20 MEQ PO TBCR: 20.0000 meq | EXTENDED_RELEASE_TABLET | Freq: Every day | ORAL | Status: DC

## 2015-12-01 MED ORDER — NEPRO/CARBSTEADY PO LIQD
237.0000 mL | ORAL | Status: DC
Start: 2015-12-02 — End: 2015-12-02
  Administered 2015-12-02: 237 mL via ORAL

## 2015-12-01 MED ORDER — CALCIUM CARBONATE ANTACID 500 MG PO CHEW
2.0000 | CHEWABLE_TABLET | Freq: Three times a day (TID) | ORAL | Status: DC
  Administered 2015-12-02 – 2015-12-07 (×15): 400 mg via ORAL
  Filled 2015-12-01 (×15): qty 2

## 2015-12-01 MED ORDER — ALLOPURINOL 100 MG PO TABS
100.0000 mg | ORAL_TABLET | Freq: Every day | ORAL | Status: DC
  Administered 2015-12-02 – 2015-12-07 (×5): 100 mg via ORAL
  Filled 2015-12-01 (×5): qty 1

## 2015-12-01 MED ORDER — METOCLOPRAMIDE HCL 10 MG PO TABS
10.0000 mg | ORAL_TABLET | Freq: Three times a day (TID) | ORAL | Status: DC
  Administered 2015-12-02 – 2015-12-07 (×15): 10 mg via ORAL
  Filled 2015-12-01 (×15): qty 1

## 2015-12-01 MED ORDER — GENTAMICIN SULFATE 0.1 % EX CREA
1.0000 "application " | TOPICAL_CREAM | Freq: Every evening | CUTANEOUS | Status: DC
  Administered 2015-12-04 – 2015-12-06 (×3): 1 via TOPICAL
  Filled 2015-12-01 (×2): qty 15

## 2015-12-01 MED ORDER — LACTULOSE 10 GM/15ML PO SOLN: 10.0000 g | ORAL | Status: DC | PRN

## 2015-12-01 MED ORDER — HEPARIN 1000 UNIT/ML FOR PERITONEAL DIALYSIS
1500.0000 [IU] | INTRAMUSCULAR | Status: DC | PRN
  Administered 2015-12-07 (×2): 1500 [IU] via INTRAPERITONEAL
  Filled 2015-12-01 (×4): qty 1.5

## 2015-12-01 MED ORDER — ENOXAPARIN SODIUM 30 MG/0.3ML ~~LOC~~ SOLN
30.0000 mg | SUBCUTANEOUS | Status: DC
Start: 2015-12-01 — End: 2015-12-02
  Administered 2015-12-01: 30 mg via SUBCUTANEOUS
  Filled 2015-12-01: qty 0.3

## 2015-12-01 MED ORDER — DELFLEX-LC/2.5% DEXTROSE 394 MOSM/L IP SOLN
Freq: Four times a day (QID) | INTRAPERITONEAL | Status: DC
  Administered 2015-12-01 – 2015-12-03 (×5): 3000 mL via INTRAPERITONEAL
  Administered 2015-12-04 (×2): via INTRAPERITONEAL
  Administered 2015-12-04 – 2015-12-06 (×6): 3000 mL via INTRAPERITONEAL
  Administered 2015-12-07: 2500 mL via INTRAPERITONEAL
  Administered 2015-12-07: 3000 mL via INTRAPERITONEAL
  Administered 2015-12-07: 13:00:00 via INTRAPERITONEAL

## 2015-12-01 MED ORDER — INSULIN GLARGINE 100 UNIT/ML ~~LOC~~ SOLN
20.0000 [IU] | Freq: Every day | SUBCUTANEOUS | Status: DC
  Administered 2015-12-02 – 2015-12-07 (×5): 20 [IU] via SUBCUTANEOUS
  Filled 2015-12-01 (×10): qty 0.2

## 2015-12-01 MED ORDER — MIRTAZAPINE 15 MG PO TABS
15.0000 mg | ORAL_TABLET | Freq: Every day | ORAL | Status: DC
  Administered 2015-12-01 – 2015-12-06 (×6): 15 mg via ORAL
  Filled 2015-12-01 (×6): qty 1

## 2015-12-01 MED ORDER — RENA-VITE PO TABS
1.0000 | ORAL_TABLET | Freq: Every day | ORAL | Status: DC
  Administered 2015-12-01 – 2015-12-06 (×6): 1 via ORAL
  Filled 2015-12-01 (×6): qty 1

## 2015-12-01 MED ORDER — PIPERACILLIN-TAZOBACTAM IN DEX 2-0.25 GM/50ML IV SOLN
2.2500 g | Freq: Two times a day (BID) | INTRAVENOUS | Status: DC
  Administered 2015-12-02: 2.25 g via INTRAVENOUS
  Filled 2015-12-01 (×2): qty 50

## 2015-12-01 MED ORDER — ONDANSETRON HCL 4 MG PO TABS: 4.0000 mg | ORAL_TABLET | Freq: Four times a day (QID) | ORAL | Status: DC | PRN

## 2015-12-01 MED ORDER — VANCOMYCIN HCL 10 G IV SOLR
2000.0000 mg | Freq: Once | INTRAVENOUS | Status: AC
  Administered 2015-12-01: 2000 mg via INTRAVENOUS
  Filled 2015-12-01: qty 2000

## 2015-12-01 MED ORDER — LEVOTHYROXINE SODIUM 112 MCG PO TABS
112.0000 ug | ORAL_TABLET | Freq: Every day | ORAL | Status: DC
  Administered 2015-12-02 – 2015-12-07 (×6): 112 ug via ORAL
  Filled 2015-12-01 (×7): qty 1

## 2015-12-01 MED ORDER — NITROGLYCERIN 0.2 MG/HR TD PT24
0.2000 mg | MEDICATED_PATCH | Freq: Every day | TRANSDERMAL | Status: DC
  Administered 2015-12-01 – 2015-12-07 (×6): 0.2 mg via TRANSDERMAL
  Filled 2015-12-01 (×7): qty 1

## 2015-12-01 MED ORDER — INSULIN ASPART 100 UNIT/ML ~~LOC~~ SOLN
0.0000 [IU] | Freq: Three times a day (TID) | SUBCUTANEOUS | Status: DC
  Administered 2015-12-02 (×3): 2 [IU] via SUBCUTANEOUS
  Administered 2015-12-03: 5 [IU] via SUBCUTANEOUS
  Administered 2015-12-03: 7 [IU] via SUBCUTANEOUS
  Administered 2015-12-03: 2 [IU] via SUBCUTANEOUS
  Administered 2015-12-04: 1 [IU] via SUBCUTANEOUS
  Administered 2015-12-05: 2 [IU] via SUBCUTANEOUS
  Administered 2015-12-05 – 2015-12-06 (×3): 3 [IU] via SUBCUTANEOUS
  Administered 2015-12-06 – 2015-12-07 (×2): 5 [IU] via SUBCUTANEOUS
  Administered 2015-12-07: 7 [IU] via SUBCUTANEOUS

## 2015-12-01 MED ORDER — PANTOPRAZOLE SODIUM 40 MG PO TBEC
40.0000 mg | DELAYED_RELEASE_TABLET | Freq: Every day | ORAL | Status: DC
  Administered 2015-12-02 – 2015-12-07 (×5): 40 mg via ORAL
  Filled 2015-12-01 (×6): qty 1

## 2015-12-01 MED ORDER — LORATADINE 10 MG PO TABS
10.0000 mg | ORAL_TABLET | Freq: Every day | ORAL | Status: DC
  Administered 2015-12-02 – 2015-12-07 (×5): 10 mg via ORAL
  Filled 2015-12-01 (×5): qty 1

## 2015-12-01 NOTE — Consult Note (Signed)
Hospital Consult    Reason for Consult:  Cellulitis & non healing right great toe wound Referring Physician:  ER MRN #:  [id number]  History of Present Illness: This is a 75 y.o. male who is a known pt to Dr. Bridgett Larsson who was seen in the wound care center today and sent over for further evaluation of his right foot.  He has developed some redness around the right great toe and onto the dorsum of the right foot.  He has been on po Cipro.  He was seen in our office in May.  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   On Oct 30, 2015, he was taken to the [facility] lab by Dr. Bridgett Larsson and underwent an arteriogram.  Despite having a right distal SFA stenosis and a proximal peroneal artery stenosis, he wanted to give this a chance to heal, since in his experience, endovascular interventions on 32mm arteries fail to stay open.  He also stated that if this failed, the pt could return to the [facility] lab for orbital atherectomy and angioplasty on the right distal SFA and proximal peroneal artery.   The pt is diabetic on on insulin.  The pt is on a daily aspirin.  He has a hx of CABG in 2011.  He is also ESRD and on peritoneal dialysis at home.   The pt is very adamant that he does not want any amputation on the right leg.   Past Medical History  Diagnosis Date  . Hypertension   . Hypothyroidism   . CHF (congestive heart failure) ([place])   . Anemia   . Blind left eye   . Sleep apnea     not on CPAP  . Coronary artery disease     CABG x 11 Jun 2009.  MRSA infections of incsions  . Arthritis   . Sarcoidosis ([place])     Hx:of  . Diabetic retinopathy ([place])     Hx: of bilateral  . Edema   . Hyperlipidemia   . 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 Atherosclerosis of native arteries of the extremities, unspecified   . Eczema     Hx: of  . Insomnia   . Seasonal allergies   . History of blood transfusion     with heart surgery  . Coronary  atherosclerosis of native coronary artery   . Atherosclerosis of native arteries of the extremities, unspecified   . Pneumonia   . Diabetes mellitus     type 2  . ESRD on dialysis [facility])     peritoneal dialysis - 5 times a time 9a, 12n, 3p, 6p, 9p  . GERD (gastroesophageal reflux disease)   . Neuropathy ([place])   . Cancer ([place])     skin cancer - melanoma on back  . Gangrene [facility])     Past Surgical History  Procedure Laterality Date  . Cardiac surgery      total of 6 surgeries, 5 related to mrsa  . Coronary artery bypass graft  06/2009  . Cataract surgery      Hx: of  . Debridements      Hx: of secondary to MRSA  . Av fistula placement Right 10/11/2012    Procedure: ARTERIOVENOUS (AV) FISTULA CREATION;  Surgeon: Rosetta Posner, MD;  Location: [place];  Service: Vascular;  Laterality: Right;  . Insertion of dialysis catheter Left   . Capd insertion N/A 11/28/2012  Procedure: LAPAROSCOPIC PERITONEAL DIALYSIS CATHETER PLACEMENT;  Surgeon: Ralene Ok, MD;  Location: [place];  Service: General;  Laterality: N/A;  . Left heart catheterization with coronary/graft angiogram N/A 01/31/2014    Procedure: LEFT HEART CATHETERIZATION WITH Beatrix Fetters;  Surgeon: Troy Sine, MD;  Location: [facility] CATH LAB;  Service: Cardiovascular;  Laterality: N/A;  . Peripheral vascular catheterization N/A 04/28/2015    Procedure: Abdominal Aortogram w/Lower Extremity;  Surgeon: Conrad Bluejacket, MD;  Location: [place] CV LAB;  Service: Cardiovascular;  Laterality: N/A;  . Amputation Left 04/30/2015    Procedure: 1st Ray Amputation Left Foot;  Surgeon: Newt Minion, MD;  Location: [place];  Service: Orthopedics;  Laterality: Left;  . Esophagogastroduodenoscopy N/A 05/20/2015    Procedure: ESOPHAGOGASTRODUODENOSCOPY (EGD);  Surgeon: Wilford Corner, MD;  Location: [facility] ENDOSCOPY;  Service: Endoscopy;  Laterality: N/A;  Possible esophageal dilatation  . Eye surgery Bilateral     cataract surgery with lens  implants  . Amputation Left 06/13/2015    Procedure: Left Below Knee Amputation;  Surgeon: Newt Minion, MD;  Location: [place];  Service: Orthopedics;  Laterality: Left;  . Peripheral vascular catheterization N/A 10/30/2015    Procedure: Abdominal Aortogram;  Surgeon: Conrad Jamestown, MD;  Location: [place] CV LAB;  Service: Cardiovascular;  Laterality: N/A;  . Lower extremity angiogram Right 10/30/2015    Procedure: Lower Extremity Angiogram;  Surgeon: Conrad Loup, MD;  Location: [place] CV LAB;  Service: Cardiovascular;  Laterality: Right;    Allergies  Allergen Reactions  . Flu Virus Vaccine Other (See Comments)    Gets the flu, patient has bad reaction to the flu vaccine (gets the flu)  . Lipitor [Atorvastatin] Other (See Comments)    Leg cramps and liver enzymes go "out of whack"  . Other Diarrhea and Nausea And Vomiting    NO TREE NUTS!!  . Peanut-Containing Drug Products Nausea And Vomiting    Prior to Admission medications   Medication Sig Start Date End Date Taking? Authorizing Provider  allopurinol (ZYLOPRIM) 100 MG tablet Take 100 mg by mouth daily.   Yes Historical Provider, MD  aspirin 325 MG EC tablet Take 325 mg by mouth daily.   Yes Historical Provider, MD  calcium carbonate (TUMS - DOSED IN MG ELEMENTAL CALCIUM) 500 MG chewable tablet Chew 2 tablets by mouth 3 (three) times daily with meals.    Yes Historical Provider, MD  cetirizine (ZYRTEC) 10 MG tablet Take 10 mg by mouth daily as needed for allergies.    Yes Historical Provider, MD  ciprofloxacin (CIPRO) 500 MG tablet Take 500 mg by mouth 2 (two) times daily. 11/28/15  Yes Historical Provider, MD  fluconazole (DIFLUCAN) 100 MG tablet Take 100 mg by mouth daily. Start date 11/24/15 to last 7 days 11/24/15  Yes Historical Provider, MD  gentamicin cream (GARAMYCIN) 0.1 % Apply 1 application topically daily. Applies to stomach.   Yes Historical Provider, MD  insulin aspart (NOVOLOG) 100 UNIT/ML injection Inject 10 Units  into the skin 3 (three) times daily with meals. To give 10 units PLUS SSI -Take none if blood sugar < 150, 151-200 =  2 units, 201-250 = 4 units, 251-300 = 6 units, 301-350 = 8 units   Yes Historical Provider, MD  Insulin Glargine (LANTUS SOLOSTAR) 100 UNIT/ML Solostar Pen Inject 20 Units into the skin daily with breakfast.   Yes Historical Provider, MD  Lactobacillus (ACIDOPHILUS PROBIOTIC PO) Take 1 tablet by mouth daily.  Yes Historical Provider, MD  lactulose (CHRONULAC) 10 GM/15ML solution Take 10 g by mouth every 4 (four) hours as needed for mild constipation.   Yes Historical Provider, MD  levothyroxine (SYNTHROID, LEVOTHROID) 112 MCG tablet LABS/APPOINTMENT OVERDUE, 1 by mouth daily in the morning 30 minutes before breakfast for Thyroid Patient taking differently: Take 112 mcg by mouth daily before breakfast.  12/10/13  Yes Tiffany L Reed, DO  loperamide (IMODIUM) 2 MG capsule Take 2 mg by mouth daily as needed for diarrhea or loose stools.   Yes Historical Provider, MD  metoCLOPramide (REGLAN) 5 MG tablet Take 2 tablets (10 mg total) by mouth 3 (three) times daily before meals. 05/02/15  Yes Costin Karlyne Greenspan, MD  midodrine (PROAMATINE) 10 MG tablet Take 1 tablet (10 mg total) by mouth 3 (three) times daily. Patient taking differently: Take 10 mg by mouth every morning.  03/11/15  Yes Pixie Casino, MD  mirtazapine (REMERON) 15 MG tablet Take 15 mg by mouth at bedtime.  09/17/15  Yes Historical Provider, MD  multivitamin (RENA-VIT) TABS tablet Take 1 tablet by mouth daily.   Yes Historical Provider, MD  Nutritional Supplements (FEEDING SUPPLEMENT, NEPRO CARB STEADY,) LIQD Drink 1 can by mouth once daily   Yes Historical Provider, MD  omeprazole (PRILOSEC) 10 MG capsule Take 10 mg by mouth daily.    Yes Historical Provider, MD  OVER THE COUNTER MEDICATION LiquiCell: Mix in orange juice and drink daily (afternoon or evening)   Yes Historical Provider, MD  potassium chloride SA (K-DUR,KLOR-CON)  20 MEQ tablet Take 20 mEq by mouth daily.   Yes Historical Provider, MD  predniSONE (DELTASONE) 20 MG tablet Take 20 mg by mouth daily as needed (for joint pain from R.A. and O.A.).   Yes Historical Provider, MD  UNABLE TO FIND Peritoneal dialysis: Every 4 waking hours (0800, 1200, 1600, 2000)   Yes Historical Provider, MD  nitroGLYCERIN (NITRODUR - DOSED IN MG/24 HR) 0.2 mg/hr patch Place patch on the top of right foot every 24 hours, remove the previous patch. Patient taking differently: Place 0.2 mg onto the skin daily. Place patch on the top of right foot every 24 hours, remove the previous patch. 10/17/15   Sharmon Leyden Nickel, NP    Social History   Social History  . Marital Status: Married    Spouse Name: N/A  . Number of Children: N/A  . Years of Education: N/A   Occupational History  . Not on file.   Social History Main Topics  . Smoking status: Never Smoker   . Smokeless tobacco: Never Used  . Alcohol Use: No  . Drug Use: No  . Sexual Activity: Not on file   Other Topics Concern  . Not on file   Social History Narrative   From [place] here a yr ago   Has 43 kids-eldest son in town     Family History  Problem Relation Age of Onset  . COPD Mother   . COPD Father     ROS: [x]  Positive   [ ]  Negative   [ ]  All sytems reviewed and are negative  Cardiovascular: []  chest pain/pressure []  palpitations []  SOB lying flat []  DOE []  pain in legs while walking []  pain in legs at rest []  pain in legs at night [x]  non-healing ulcers []  hx of DVT [x]  swelling in right leg  Pulmonary: []  productive cough []  asthma/wheezing []  home O2  Neurologic: []  weakness in []   arms []  legs []  numbness in []  arms []  legs []  hx of CVA []  mini stroke [] difficulty speaking or slurred speech []  temporary loss of vision in one eye []  dizziness  Hematologic: [x]  hx of cancer-melanoma []  bleeding problems []  problems with blood clotting easily  Endocrine:    [x]  diabetes []  thyroid disease  GI []  vomiting blood []  blood in stool  GU: [x]  CKD/renal failure []  HD--[]  M/W/F or []  T/T/S; [x]  peritoneal dialysis []  burning with urination []  blood in urine  Psychiatric: []  anxiety []  depression  Musculoskeletal: []  arthritis []  joint pain [x]  hx gout  Integumentary: []  rashes [x]  ulcers  Constitutional: []  fever []  chills   Physical Examination  Filed Vitals:   12/01/15 1745 12/01/15 1800  BP: 139/67 124/69  Pulse: 72 73  Temp:    Resp:     There is no weight on file to calculate BMI.  General:  WDWN in NAD Gait: Not observed HENT: WNL, normocephalic Pulmonary: normal non-labored breathing Abdomen: obese Skin: without rashes Vascular Exam/Pulses:  Right  Femoral 2+ (normal)  Popliteal Unable to palpate   DP Unable to palpate   PT Unable to palpate    Extremities: gangrenous right great toe with erythema present onto the dorsum of the foot. Prosthesis in place left leg Musculoskeletal: no muscle wasting or atrophy  Neurologic: A&O X 3; Appropriate Affect;  moving all extremities equally. Speech is fluent/normal   CBC    Component Value Date/Time   WBC 12.3* 12/01/2015 1237   RBC 3.64* 12/01/2015 1237   RBC 3.34* 10/05/2012 0340   HGB 11.0* 12/01/2015 1237   HCT 34.8* 12/01/2015 1237   PLT 414* 12/01/2015 1237   MCV 95.6 12/01/2015 1237   MCH 30.2 12/01/2015 1237   MCHC 31.6 12/01/2015 1237   RDW 14.4 12/01/2015 1237   LYMPHSABS 1.9 12/01/2015 1237   MONOABS 1.1* 12/01/2015 1237   EOSABS 0.2 12/01/2015 1237   BASOSABS 0.0 12/01/2015 1237    BMET    Component Value Date/Time   NA 133* 12/01/2015 1237   K 3.3* 12/01/2015 1237   CL 92* 12/01/2015 1237   CO2 27 12/01/2015 1237   GLUCOSE 52* 12/01/2015 1237   BUN 47* 12/01/2015 1237   CREATININE 6.43* 12/01/2015 1237   CREATININE 4.14* 01/30/2014 1328   CALCIUM 8.3* 12/01/2015 1237   GFRNONAA 8* 12/01/2015 1237   GFRAA 9* 12/01/2015 1237     COAGS: Lab Results  Component Value Date   INR 1.08 06/13/2015   INR 1.02 04/29/2015   INR 0.95 01/30/2014     Non-Invasive Vascular Imaging:   None today  Statin:  No. Beta Blocker:  No. Aspirin:  Yes.   ACEI:  No. ARB:  No. Other antiplatelets/anticoagulants:  No.    ASSESSMENT/PLAN: This is a 75 y.o. male known to Dr. Bridgett Larsson who recently underwent arteriogram now with worsening gangrenous right great toe with erythema onto the dorsum of the foot.    -pt will be admitted to the medical service and started on IV Abx. -Will inform Dr. Bridgett Larsson of pt's admission tomorrow morning. -most likely will need to go for arteriogram with possible intervention at some point  -ESRD-dialysis per renal service -pt & his wife are adamant that he does not want an amputation   Leontine Locket, PA-C [facility] [phone]  I have examined the patient, reviewed and agree with above.  Curt Jews, MD 12/01/2015 8:37 PM

## 2015-12-01 NOTE — ED Notes (Signed)
Blood culture drawn upon IV access and placed at bedside.

## 2015-12-01 NOTE — ED Notes (Signed)
Per PT, Pt is coming from MD Dellia Nims with a referral to be admitted and evaluation of his right great toe. Pt has been followed for two weeks with no increase in condition. MD [name] evaluated the patient and reported potential need of BKA. Pt has cellulitis to the right great toe with purulent drainage. Denies fever or increase of nausea, vomiting, or diarrhea. Hx of Peritoneal Dialysis.

## 2015-12-01 NOTE — Progress Notes (Signed)
New Admission Note:  Arrival Method: Strecther Mental Orientation: Alert and oriented x4 Telemetry: N/A Assessment: Completed Skin: Cellulitis Rt lower leg.  Diabetic  RT Great toe ulcer. LLL swollen and red. Syage 1 to sacrum with MASD.  IV: NSL to left upper arm Pain: Denies Tubes: PD cath LLQ Safety Measures: Safety Fall Prevention Plan was given, discussed and signed. Admission: Completed [address] Orientation: Patient has been orientated to the room, unit and the staff. Family: N/A  Orders have been reviewed and implemented. Will continue to monitor the patient. Call light has been placed within reach and bed alarm has been activated.   Sima Matas BSN, RN  Phone Number: [phone]

## 2015-12-01 NOTE — H&P (Signed)
History and Physical    Jack Chung DOB: 07-08-1940 DOA: 12/01/2015  PCP: [name], MD   Patient coming from: Dr. Lilla Shook office.  Chief Complaint: Foot infection.  HPI: Jack Chung is a 75 y.o. male with medical history significant of type 2 diabetes, diabetic peripheral neuropathy, ESRD on PD, left BKA, hypertension, hypothyroidism, CHF, CAD, CABG, arthritis, sarcoidosis, hyperlipidemia, seasonal allergies who was referred from the wound clinic for evaluation of worsening chronic right foot infection. He was admitted last month for this issue as well.   He has been following at the wound clinic and was last seen today and referred to the hospital for further evaluation and treatment  (See vascular surgery notes from today for further details). Most recently, the patient has been treated recently with oral ciprofloxacin after a bone biopsy revealed Escherichia coli sensitive to fluoroquinolones. He has a previous history of MRSA infection as well.  When seen in the ER, the patient was seen in no acute distress. He denies fever, chills, night sweats, chest pain, dyspnea, abdominal pain, nausea, vomiting, diarrhea, constipation or GU symptoms.   ED Course: The patient receive IV antibiotics, vascular surgery and our hospitalist service was consulted.  Review of Systems: As per HPI otherwise 10 point review of systems negative.    Past Medical History  Diagnosis Date  . Hypertension   . Hypothyroidism   . CHF (congestive heart failure) ([place])   . Anemia   . Blind left eye   . Sleep apnea     not on CPAP  . Coronary artery disease     CABG x 11 Jun 2009.  MRSA infections of incsions  . Arthritis   . Sarcoidosis ([place])     Hx:of  . Diabetic retinopathy ([place])     Hx: of bilateral  . Edema   . Hyperlipidemia   . 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 Atherosclerosis of native arteries of the extremities, unspecified   . Eczema    Hx: of  . Insomnia   . Seasonal allergies   . History of blood transfusion     with heart surgery  . Coronary atherosclerosis of native coronary artery   . Atherosclerosis of native arteries of the extremities, unspecified   . Pneumonia   . Diabetes mellitus     type 2  . ESRD on dialysis [facility])     peritoneal dialysis - 5 times a time 9a, 12n, 3p, 6p, 9p  . GERD (gastroesophageal reflux disease)   . Neuropathy ([place])   . Cancer ([place])     skin cancer - melanoma on back  . Gangrene [facility])     Past Surgical History  Procedure Laterality Date  . Cardiac surgery      total of 6 surgeries, 5 related to mrsa  . Coronary artery bypass graft  06/2009  . Cataract surgery      Hx: of  . Debridements      Hx: of secondary to MRSA  . Av fistula placement Right 10/11/2012    Procedure: ARTERIOVENOUS (AV) FISTULA CREATION;  Surgeon: Rosetta Posner, MD;  Location: [place];  Service: Vascular;  Laterality: Right;  . Insertion of dialysis catheter Left   . Capd insertion N/A 11/28/2012    Procedure: LAPAROSCOPIC PERITONEAL DIALYSIS CATHETER PLACEMENT;  Surgeon: Ralene Ok, MD;  Location: [place];  Service: General;  Laterality: N/A;  . Left heart catheterization with coronary/graft angiogram N/A 01/31/2014    Procedure:  LEFT HEART CATHETERIZATION WITH Beatrix Fetters;  Surgeon: Troy Sine, MD;  Location: [facility] CATH LAB;  Service: Cardiovascular;  Laterality: N/A;  . Peripheral vascular catheterization N/A 04/28/2015    Procedure: Abdominal Aortogram w/Lower Extremity;  Surgeon: Conrad Elroy, MD;  Location: [place] CV LAB;  Service: Cardiovascular;  Laterality: N/A;  . Amputation Left 04/30/2015    Procedure: 1st Ray Amputation Left Foot;  Surgeon: Newt Minion, MD;  Location: [place];  Service: Orthopedics;  Laterality: Left;  . Esophagogastroduodenoscopy N/A 05/20/2015    Procedure: ESOPHAGOGASTRODUODENOSCOPY (EGD);  Surgeon: Wilford Corner, MD;  Location: [facility] ENDOSCOPY;  Service:  Endoscopy;  Laterality: N/A;  Possible esophageal dilatation  . Eye surgery Bilateral     cataract surgery with lens implants  . Amputation Left 06/13/2015    Procedure: Left Below Knee Amputation;  Surgeon: Newt Minion, MD;  Location: [place];  Service: Orthopedics;  Laterality: Left;  . Peripheral vascular catheterization N/A 10/30/2015    Procedure: Abdominal Aortogram;  Surgeon: Conrad Crescent, MD;  Location: [place] CV LAB;  Service: Cardiovascular;  Laterality: N/A;  . Lower extremity angiogram Right 10/30/2015    Procedure: Lower Extremity Angiogram;  Surgeon: Conrad Houston, MD;  Location: [place] CV LAB;  Service: Cardiovascular;  Laterality: Right;     reports that he has never smoked. He has never used smokeless tobacco. He reports that he does not drink alcohol or use illicit drugs.  Allergies  Allergen Reactions  . Flu Virus Vaccine Other (See Comments)    Gets the flu, patient has bad reaction to the flu vaccine (gets the flu)  . Lipitor [Atorvastatin] Other (See Comments)    Leg cramps and liver enzymes go "out of whack"  . Other Diarrhea and Nausea And Vomiting    NO TREE NUTS!!  . Peanut-Containing Drug Products Nausea And Vomiting    Family History  Problem Relation Age of Onset  . COPD Mother   . COPD Father     Prior to Admission medications   Medication Sig Start Date End Date Taking? Authorizing Provider  allopurinol (ZYLOPRIM) 100 MG tablet Take 100 mg by mouth daily.   Yes Historical Provider, MD  aspirin 325 MG EC tablet Take 325 mg by mouth daily.   Yes Historical Provider, MD  calcium carbonate (TUMS - DOSED IN MG ELEMENTAL CALCIUM) 500 MG chewable tablet Chew 2 tablets by mouth 3 (three) times daily with meals.    Yes Historical Provider, MD  cetirizine (ZYRTEC) 10 MG tablet Take 10 mg by mouth daily as needed for allergies.    Yes Historical Provider, MD  ciprofloxacin (CIPRO) 500 MG tablet Take 500 mg by mouth 2 (two) times daily. 11/28/15  Yes  Historical Provider, MD  fluconazole (DIFLUCAN) 100 MG tablet Take 100 mg by mouth daily. Start date 11/24/15 to last 7 days 11/24/15  Yes Historical Provider, MD  gentamicin cream (GARAMYCIN) 0.1 % Apply 1 application topically daily. Applies to stomach.   Yes Historical Provider, MD  insulin aspart (NOVOLOG) 100 UNIT/ML injection Inject 10 Units into the skin 3 (three) times daily with meals. To give 10 units PLUS SSI -Take none if blood sugar < 150, 151-200 =  2 units, 201-250 = 4 units, 251-300 = 6 units, 301-350 = 8 units   Yes Historical Provider, MD  Insulin Glargine (LANTUS SOLOSTAR) 100 UNIT/ML Solostar Pen Inject 20 Units into the skin daily with breakfast.   Yes Historical Provider, MD  Lactobacillus (  ACIDOPHILUS PROBIOTIC PO) Take 1 tablet by mouth daily.    Yes Historical Provider, MD  lactulose (CHRONULAC) 10 GM/15ML solution Take 10 g by mouth every 4 (four) hours as needed for mild constipation.   Yes Historical Provider, MD  levothyroxine (SYNTHROID, LEVOTHROID) 112 MCG tablet LABS/APPOINTMENT OVERDUE, 1 by mouth daily in the morning 30 minutes before breakfast for Thyroid Patient taking differently: Take 112 mcg by mouth daily before breakfast.  12/10/13  Yes Tiffany L Reed, DO  loperamide (IMODIUM) 2 MG capsule Take 2 mg by mouth daily as needed for diarrhea or loose stools.   Yes Historical Provider, MD  metoCLOPramide (REGLAN) 5 MG tablet Take 2 tablets (10 mg total) by mouth 3 (three) times daily before meals. 05/02/15  Yes Costin Karlyne Greenspan, MD  midodrine (PROAMATINE) 10 MG tablet Take 1 tablet (10 mg total) by mouth 3 (three) times daily. Patient taking differently: Take 10 mg by mouth every morning.  03/11/15  Yes Pixie Casino, MD  mirtazapine (REMERON) 15 MG tablet Take 15 mg by mouth at bedtime.  09/17/15  Yes Historical Provider, MD  multivitamin (RENA-VIT) TABS tablet Take 1 tablet by mouth daily.   Yes Historical Provider, MD  Nutritional Supplements (FEEDING SUPPLEMENT,  NEPRO CARB STEADY,) LIQD Drink 1 can by mouth once daily   Yes Historical Provider, MD  omeprazole (PRILOSEC) 10 MG capsule Take 10 mg by mouth daily.    Yes Historical Provider, MD  OVER THE COUNTER MEDICATION LiquiCell: Mix in orange juice and drink daily (afternoon or evening)   Yes Historical Provider, MD  potassium chloride SA (K-DUR,KLOR-CON) 20 MEQ tablet Take 20 mEq by mouth daily.   Yes Historical Provider, MD  predniSONE (DELTASONE) 20 MG tablet Take 20 mg by mouth daily as needed (for joint pain from R.A. and O.A.).   Yes Historical Provider, MD  UNABLE TO FIND Peritoneal dialysis: Every 4 waking hours (0800, 1200, 1600, 2000)   Yes Historical Provider, MD  nitroGLYCERIN (NITRODUR - DOSED IN MG/24 HR) 0.2 mg/hr patch Place patch on the top of right foot every 24 hours, remove the previous patch. Patient taking differently: Place 0.2 mg onto the skin daily. Place patch on the top of right foot every 24 hours, remove the previous patch. 10/17/15   Sharmon Leyden Nickel, NP    Physical Exam: Filed Vitals:   12/01/15 1815 12/01/15 1830 12/01/15 1845 12/01/15 1900  BP: 144/69 133/65 139/72 129/70  Pulse: 74 77 76 78  Temp:      TempSrc:      Resp:    16  Height:      SpO2: 99% 100% 100% 99%      Constitutional: NAD, calm, comfortable Filed Vitals:   12/01/15 1815 12/01/15 1830 12/01/15 1845 12/01/15 1900  BP: 144/69 133/65 139/72 129/70  Pulse: 74 77 76 78  Temp:      TempSrc:      Resp:    16  Height:      SpO2: 99% 100% 100% 99%   Eyes: PERRL, lids and conjunctivae normal ENMT: Mucous membranes are moist. Posterior pharynx clear of any exudate or lesions.Normal dentition.  Neck: normal, supple, no masses, no thyromegaly Respiratory: clear to auscultation bilaterally, no wheezing, no crackles. Normal respiratory effort. No accessory muscle use.  Cardiovascular: Regular rate and rhythm, no murmurs / rubs / gallops. No extremity edema. Absent pedal pulse on the right. No carotid  bruits.  Abdomen: no tenderness, no masses palpated. No hepatosplenomegaly. Bowel  sounds positive.  Musculoskeletal: no clubbing / cyanosis. Left BKA with prosthesis. Skin: Right toe looks  gangrenous with surrounding edema and erythema. There is no tenderness due to the patient's loss of sensation secondary to peripheral neuropathy. Neurologic: CN 2-12 grossly intact. Symmetrical decreased sensation on lower extremities. Psychiatric: Normal judgment and insight. Alert and oriented x 4. Normal mood.    Labs on Admission: I have personally reviewed following labs and imaging studies  CBC:  Recent Labs Lab 12/01/15 1237  WBC 12.3*  NEUTROABS 9.0*  HGB 11.0*  HCT 34.8*  MCV 95.6  PLT [id number]*   Basic Metabolic Panel:  Recent Labs Lab 12/01/15 1237  NA 133*  K 3.3*  CL 92*  CO2 27  GLUCOSE 52*  BUN 47*  CREATININE 6.43*  CALCIUM 8.3*   CBG:  Recent Labs Lab 11/27/15 0741 11/27/15 0954 11/28/15 0729 12/01/15 0734 12/01/15 0951  GLUCAP 181* 243* 184* 343* 148*    Urine analysis:    Component Value Date/Time   COLORURINE RED* 05/20/2015 2211   APPEARANCEUR CLOUDY* 05/20/2015 2211   LABSPEC 1.022 05/20/2015 2211   PHURINE 5.0 05/20/2015 2211   GLUCOSEU 250* 05/20/2015 2211   HGBUR MODERATE* 05/20/2015 2211   BILIRUBINUR MODERATE* 05/20/2015 2211   KETONESUR 15* 05/20/2015 2211   PROTEINUR 30* 05/20/2015 2211   UROBILINOGEN 0.2 07/17/2013 0334   NITRITE NEGATIVE 05/20/2015 2211   LEUKOCYTESUR MODERATE* 05/20/2015 2211    Assessment/Plan Principal Problem:   Cellulitis of right lower extremity Admit to MedSurg/inpatient. Continue IV Zosyn per pharmacy for gram-negative coverage. Vancomycin per pharmacy consult, since the patient has a previous history of MRSA infection. Check ABI/TBI vascular studies. Dr. Bridgett Larsson has been contacted by the emergency department to evaluate. Follow-up blood cultures and sensitivity.  Active Problems:   Anemia Monitor  hematocrit and hemoglobin.   ESRD on peritoneal dialysis [facility]) Nephrology was consulted for PD orders.    Gout Continue allopurinol.    CAD (coronary artery disease) Stable at this time. Continue aspirin.    Type 2 diabetes mellitus with polyneuropathy (HCC) Carbohydratemodified/renal diet. Continue Lantus 20 units by mouth in a.m. Continue CBG monitoring with regular insulin sliding scale. Check hemoglobin A1c.    Hypothyroidism Continue levothyroxine. Monitor TSH periodically.   DVT prophylaxis: Lovenox. Code Status: Full code. Family Communication:  Disposition Plan: Admit for IV antibiotic therapy and further evaluation. Consults called: Nephrology (Dr. Justin Mend).                              Vascular surgery (Dr. Bridgett Larsson) Admission status: Inpatient/MedSurg.   Reubin Milan MD [facility] Pager [phone].  If 7PM-7AM, please contact night-coverage [email or web address] Password [facility]  12/01/2015, 7:35 PM

## 2015-12-01 NOTE — ED Notes (Signed)
Family at bedside. 

## 2015-12-01 NOTE — Progress Notes (Signed)
ANTIBIOTIC CONSULT NOTE - INITIAL  Pharmacy Consult for zosyn and vancomycin Indication: wound infection  Allergies  Allergen Reactions  . Flu Virus Vaccine Other (See Comments)    Gets the flu, patient has bad reaction to the flu vaccine (gets the flu)  . Lipitor [Atorvastatin] Other (See Comments)    Leg cramps and liver enzymes go "out of whack"  . Other Diarrhea and Nausea And Vomiting    NO TREE NUTS!!  . Peanut-Containing Drug Products Nausea And Vomiting    Patient Measurements: Height: 5\' 10"  (177.8 cm) Weight: 218 lb 6.4 oz (99.066 kg) IBW/kg (Calculated) : 73 Adjusted Body Weight:   Vital Signs: Temp: 97.5 F (36.4 C) (06/26 2054) Temp Source: Oral (06/26 2054) BP: 153/56 mmHg (06/26 2054) Pulse Rate: 79 (06/26 2054) Intake/Output from previous day:   Intake/Output from this shift: Total I/O In: 50 [I.V.:50] Out: -   Labs:  Recent Labs  12/01/15 1237  WBC 12.3*  HGB 11.0*  PLT 414*  CREATININE 6.43*   Estimated Creatinine Clearance: 12.2 mL/min (by C-G formula based on Cr of 6.43). No results for input(s): VANCOTROUGH, VANCOPEAK, VANCORANDOM, GENTTROUGH, GENTPEAK, GENTRANDOM, TOBRATROUGH, TOBRAPEAK, TOBRARND, AMIKACINPEAK, AMIKACINTROU, AMIKACIN in the last 72 hours.   Microbiology: No results found for this or any previous visit (from the past 720 hour(s)).  Medical History: Past Medical History  Diagnosis Date  . Hypertension   . Hypothyroidism   . CHF (congestive heart failure) ([place])   . Anemia   . Blind left eye   . Sleep apnea     not on CPAP  . Coronary artery disease     CABG x 11 Jun 2009.  MRSA infections of incsions  . Arthritis   . Sarcoidosis ([place])     Hx:of  . Diabetic retinopathy ([place])     Hx: of bilateral  . Edema   . Hyperlipidemia   . 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 Atherosclerosis of native arteries of the extremities, unspecified   . Eczema     Hx: of  . Insomnia   . Seasonal  allergies   . History of blood transfusion     with heart surgery  . Coronary atherosclerosis of native coronary artery   . Atherosclerosis of native arteries of the extremities, unspecified   . Pneumonia   . Diabetes mellitus     type 2  . ESRD on dialysis [facility])     peritoneal dialysis - 5 times a time 9a, 12n, 3p, 6p, 9p  . GERD (gastroesophageal reflux disease)   . Neuropathy ([place])   . 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 Gangrene ([place])     Medications:  Scheduled:  . allopurinol  100 mg Oral Daily  . aspirin  325 mg Oral Daily  . [START ON 12/02/2015] calcium carbonate  2 tablet Oral TID WC  . enoxaparin (LOVENOX) injection  30 mg Subcutaneous Q24H  . feeding supplement (NEPRO CARB STEADY)  237 mL Oral Q24H  . [START ON 12/02/2015] fluconazole  100 mg Oral Daily  . gentamicin cream  1 application Topical QPM  . [START ON 12/02/2015] insulin aspart  0-9 Units Subcutaneous TID WC  . [START ON 12/02/2015] insulin aspart  0-9 Units Subcutaneous TID WC  . [START ON 12/02/2015] insulin glargine  20 Units Subcutaneous Q breakfast  . [START ON 12/02/2015] levothyroxine  112 mcg Oral QAC breakfast  . loratadine  10 mg  Oral Daily  . [START ON 12/02/2015] metoCLOPramide  10 mg Oral TID AC  . [START ON 12/02/2015] midodrine  10 mg Oral q morning - 10a  . mirtazapine  15 mg Oral QHS  . multivitamin  1 tablet Oral Daily  . nitroGLYCERIN  0.2 mg Transdermal Daily  . pantoprazole  40 mg Oral Daily  . potassium chloride SA  20 mEq Oral Daily   Infusions:   Assessment: 75 yo male with wound infection will be started on vancomycin and zosyn.  Hx of ESRD on peritoneal dialysis. Patient received one dose of zosyn 3.375g at [place] on 12/01/15   Goal of Therapy:  Vancomycin trough level 15-20 mcg/ml  Plan:  - vancomycin 2g iv x1.  Obtain random vancomycin in 3 days if non-anuric or 5 days if anuric to reassess vancomycin dosing - zosyn 2.25g iv q12 hours, 1st dose at 0700 on  12/02/14  Anuj Summons, Tsz-Yin 12/01/2015,9:36 PM

## 2015-12-01 NOTE — ED Provider Notes (Signed)
CSN: [id number]     Arrival date & time 12/01/15  1142 History   First MD Initiated Contact with Patient 12/01/15 1712     Chief Complaint  Patient presents with  . Wound Infection     (Consider location/radiation/quality/duration/timing/severity/associated sxs/prior Treatment) HPI Comments: The patient is a 75 year old male, he is a known diabetic, he is on end-stage renal disease on peritoneal dialysis. He presents from the wound care center after he was seen today for a persistently infected right great toe which has an necrotic appearance, he has failed hyperbaric oxygen as well as oral antibiotics and continues to have purulent drainage as well as a biopsy that was positive for Escherichia coli sensitive to fluoroquinolones which she was switched to recently. The patient denies any pain in his feet. He did not get dialysis today which she was due for. The symptoms are persistent, gradually worsening, not associated with fevers.  The history is provided by the patient.    Past Medical History  Diagnosis Date  . Hypertension   . Hypothyroidism   . CHF (congestive heart failure) ([place])   . Anemia   . Blind left eye   . Sleep apnea     not on CPAP  . Coronary artery disease     CABG x 11 Jun 2009.  MRSA infections of incsions  . Arthritis   . Sarcoidosis ([place])     Hx:of  . Diabetic retinopathy ([place])     Hx: of bilateral  . Edema   . Hyperlipidemia   . 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 Atherosclerosis of native arteries of the extremities, unspecified   . Eczema     Hx: of  . Insomnia   . Seasonal allergies   . History of blood transfusion     with heart surgery  . Coronary atherosclerosis of native coronary artery   . Atherosclerosis of native arteries of the extremities, unspecified   . Pneumonia   . Diabetes mellitus     type 2  . ESRD on dialysis [facility])     peritoneal dialysis - 5 times a time 9a, 12n, 3p, 6p, 9p  . GERD (gastroesophageal  reflux disease)   . Neuropathy ([place])   . Cancer ([place])     skin cancer - melanoma on back  . Gangrene [facility])    Past Surgical History  Procedure Laterality Date  . Cardiac surgery      total of 6 surgeries, 5 related to mrsa  . Coronary artery bypass graft  06/2009  . Cataract surgery      Hx: of  . Debridements      Hx: of secondary to MRSA  . Av fistula placement Right 10/11/2012    Procedure: ARTERIOVENOUS (AV) FISTULA CREATION;  Surgeon: Rosetta Posner, MD;  Location: [place];  Service: Vascular;  Laterality: Right;  . Insertion of dialysis catheter Left   . Capd insertion N/A 11/28/2012    Procedure: LAPAROSCOPIC PERITONEAL DIALYSIS CATHETER PLACEMENT;  Surgeon: Ralene Ok, MD;  Location: [place];  Service: General;  Laterality: N/A;  . Left heart catheterization with coronary/graft angiogram N/A 01/31/2014    Procedure: LEFT HEART CATHETERIZATION WITH Beatrix Fetters;  Surgeon: Troy Sine, MD;  Location: [facility] CATH LAB;  Service: Cardiovascular;  Laterality: N/A;  . Peripheral vascular catheterization N/A 04/28/2015    Procedure: Abdominal Aortogram w/Lower Extremity;  Surgeon: Conrad San German, MD;  Location: [place] CV LAB;  Service: Cardiovascular;  Laterality:  N/A;  . Amputation Left 04/30/2015    Procedure: 1st Ray Amputation Left Foot;  Surgeon: Newt Minion, MD;  Location: [place];  Service: Orthopedics;  Laterality: Left;  . Esophagogastroduodenoscopy N/A 05/20/2015    Procedure: ESOPHAGOGASTRODUODENOSCOPY (EGD);  Surgeon: Wilford Corner, MD;  Location[facility] ENDOSCOPY;  Service: Endoscopy;  Laterality: N/A;  Possible esophageal dilatation  . Eye surgery Bilateral     cataract surgery with lens implants  . Amputation Left 06/13/2015    Procedure: Left Below Knee Amputation;  Surgeon: Newt Minion, MD;  Location: [place];  Service: Orthopedics;  Laterality: Left;  . Peripheral vascular catheterization N/A 10/30/2015    Procedure: Abdominal Aortogram;  Surgeon: Conrad Philip,  MD;  Location: [place] CV LAB;  Service: Cardiovascular;  Laterality: N/A;  . Lower extremity angiogram Right 10/30/2015    Procedure: Lower Extremity Angiogram;  Surgeon: Conrad Bucklin, MD;  Location: [place] CV LAB;  Service: Cardiovascular;  Laterality: Right;   Family History  Problem Relation Age of Onset  . COPD Mother   . COPD Father    Social History  Substance Use Topics  . Smoking status: Never Smoker   . Smokeless tobacco: Never Used  . Alcohol Use: No    Review of Systems  All other systems reviewed and are negative.     Allergies  Flu virus vaccine; Lipitor; Other; and Peanut-containing drug products  Home Medications   Prior to Admission medications   Medication Sig Start Date End Date Taking? Authorizing Provider  allopurinol (ZYLOPRIM) 100 MG tablet Take 100 mg by mouth daily.   Yes Historical Provider, MD  aspirin 325 MG EC tablet Take 325 mg by mouth daily.   Yes Historical Provider, MD  calcium carbonate (TUMS - DOSED IN MG ELEMENTAL CALCIUM) 500 MG chewable tablet Chew 2 tablets by mouth 3 (three) times daily with meals.    Yes Historical Provider, MD  cetirizine (ZYRTEC) 10 MG tablet Take 10 mg by mouth daily as needed for allergies.    Yes Historical Provider, MD  ciprofloxacin (CIPRO) 500 MG tablet Take 500 mg by mouth 2 (two) times daily. 11/28/15  Yes Historical Provider, MD  fluconazole (DIFLUCAN) 100 MG tablet Take 100 mg by mouth daily. Start date 11/24/15 to last 7 days 11/24/15  Yes Historical Provider, MD  gentamicin cream (GARAMYCIN) 0.1 % Apply 1 application topically daily. Applies to stomach.   Yes Historical Provider, MD  insulin aspart (NOVOLOG) 100 UNIT/ML injection Inject 10 Units into the skin 3 (three) times daily with meals. To give 10 units PLUS SSI -Take none if blood sugar < 150, 151-200 =  2 units, 201-250 = 4 units, 251-300 = 6 units, 301-350 = 8 units   Yes Historical Provider, MD  Insulin Glargine (LANTUS SOLOSTAR) 100 UNIT/ML  Solostar Pen Inject 20 Units into the skin daily with breakfast.   Yes Historical Provider, MD  Lactobacillus (ACIDOPHILUS PROBIOTIC PO) Take 1 tablet by mouth daily.    Yes Historical Provider, MD  lactulose (CHRONULAC) 10 GM/15ML solution Take 10 g by mouth every 4 (four) hours as needed for mild constipation.   Yes Historical Provider, MD  levothyroxine (SYNTHROID, LEVOTHROID) 112 MCG tablet LABS/APPOINTMENT OVERDUE, 1 by mouth daily in the morning 30 minutes before breakfast for Thyroid Patient taking differently: Take 112 mcg by mouth daily before breakfast.  12/10/13  Yes Tiffany L Reed, DO  loperamide (IMODIUM) 2 MG capsule Take 2 mg by mouth daily as needed for  diarrhea or loose stools.   Yes Historical Provider, MD  metoCLOPramide (REGLAN) 5 MG tablet Take 2 tablets (10 mg total) by mouth 3 (three) times daily before meals. 05/02/15  Yes Costin Karlyne Greenspan, MD  midodrine (PROAMATINE) 10 MG tablet Take 1 tablet (10 mg total) by mouth 3 (three) times daily. Patient taking differently: Take 10 mg by mouth every morning.  03/11/15  Yes Pixie Casino, MD  mirtazapine (REMERON) 15 MG tablet Take 15 mg by mouth at bedtime.  09/17/15  Yes Historical Provider, MD  multivitamin (RENA-VIT) TABS tablet Take 1 tablet by mouth daily.   Yes Historical Provider, MD  Nutritional Supplements (FEEDING SUPPLEMENT, NEPRO CARB STEADY,) LIQD Drink 1 can by mouth once daily   Yes Historical Provider, MD  omeprazole (PRILOSEC) 10 MG capsule Take 10 mg by mouth daily.    Yes Historical Provider, MD  OVER THE COUNTER MEDICATION LiquiCell: Mix in orange juice and drink daily (afternoon or evening)   Yes Historical Provider, MD  potassium chloride SA (K-DUR,KLOR-CON) 20 MEQ tablet Take 20 mEq by mouth daily.   Yes Historical Provider, MD  predniSONE (DELTASONE) 20 MG tablet Take 20 mg by mouth daily as needed (for joint pain from R.A. and O.A.).   Yes Historical Provider, MD  UNABLE TO FIND Peritoneal dialysis: Every 4  waking hours (0800, 1200, 1600, 2000)   Yes Historical Provider, MD  nitroGLYCERIN (NITRODUR - DOSED IN MG/24 HR) 0.2 mg/hr patch Place patch on the top of right foot every 24 hours, remove the previous patch. Patient taking differently: Place 0.2 mg onto the skin daily. Place patch on the top of right foot every 24 hours, remove the previous patch. 10/17/15   Sharmon Leyden Nickel, NP   BP 139/72 mmHg  Pulse 76  Temp(Src) 98 F (36.7 C) (Oral)  Resp 18  Ht 5\' 10"  (1.778 m)  SpO2 100% Physical Exam  Constitutional: He appears well-developed and well-nourished. No distress.  HENT:  Head: Normocephalic and atraumatic.  Mouth/Throat: Oropharynx is clear and moist. No oropharyngeal exudate.  Eyes: Conjunctivae and EOM are normal. Pupils are equal, round, and reactive to light. Right eye exhibits no discharge. Left eye exhibits no discharge. No scleral icterus.  Neck: Normal range of motion. Neck supple. No JVD present. No thyromegaly present.  Cardiovascular: Normal rate, regular rhythm, normal heart sounds and intact distal pulses.  Exam reveals no gallop and no friction rub.   No murmur heard. Pulmonary/Chest: Effort normal and breath sounds normal. No respiratory distress. He has no wheezes. He has no rales.  Abdominal: Soft. Bowel sounds are normal. He exhibits no distension and no mass. There is no tenderness.  Musculoskeletal: Normal range of motion.  Left amputated leg present, nontender, right great toe with necrotic appearance, some purulent drainage, some spreading redness onto the top of the foot. He does have pitting edema to the right lower extremity  Lymphadenopathy:    He has no cervical adenopathy.  Neurological: He is alert. Coordination normal.  Skin: Skin is warm and dry. No rash noted. No erythema.  Psychiatric: He has a normal mood and affect. His behavior is normal.  Nursing note and vitals reviewed.   ED Course  Procedures (including critical care time) Labs  Review Labs Reviewed  CBC WITH DIFFERENTIAL/PLATELET - Abnormal; Notable for the following:    WBC 12.3 (*)    RBC 3.64 (*)    Hemoglobin 11.0 (*)    HCT 34.8 (*)    Platelets  414 (*)    Neutro Abs 9.0 (*)    Monocytes Absolute 1.1 (*)    All other components within normal limits  BASIC METABOLIC PANEL - Abnormal; Notable for the following:    Sodium 133 (*)    Potassium 3.3 (*)    Chloride 92 (*)    Glucose, Bld 52 (*)    BUN 47 (*)    Creatinine, Ser 6.43 (*)    Calcium 8.3 (*)    GFR calc non Af Amer 8 (*)    GFR calc Af Amer 9 (*)    All other components within normal limits    Imaging Review No results found. I have personally reviewed and evaluated these images and lab results as part of my medical decision-making.    MDM   Final diagnoses:  Acute osteomyelitis of right foot ([place])    The patient is not ill-appearing but he does have a leukocytosis and appearance of an infected toe with known osteomyelitis based on biopsy. The patient will need to be admitted to the hospital for IV antibiotics, will also discussed with vascular surgery. The patient and family member are in agreement. His lab work suggested he does not need emergent dialysis.  WBC elevated - d/w Dr. Olevia Bowens - will admit - and declines holding orders - will do himself, appreciate timely consult.  Noemi Chapel, MD 12/01/15 Lurena Nida

## 2015-12-02 ENCOUNTER — Ambulatory Visit: Payer: Medicare Other | Admitting: Physical Therapy

## 2015-12-02 ENCOUNTER — Inpatient Hospital Stay (HOSPITAL_COMMUNITY): Payer: Medicare Other

## 2015-12-02 DIAGNOSIS — N186 End stage renal disease: Secondary | ICD-10-CM

## 2015-12-02 DIAGNOSIS — I96 Gangrene, not elsewhere classified: Secondary | ICD-10-CM

## 2015-12-02 DIAGNOSIS — E1142 Type 2 diabetes mellitus with diabetic polyneuropathy: Secondary | ICD-10-CM

## 2015-12-02 DIAGNOSIS — Z992 Dependence on renal dialysis: Secondary | ICD-10-CM

## 2015-12-02 DIAGNOSIS — E039 Hypothyroidism, unspecified: Secondary | ICD-10-CM

## 2015-12-02 DIAGNOSIS — I2510 Atherosclerotic heart disease of native coronary artery without angina pectoris: Secondary | ICD-10-CM

## 2015-12-02 LAB — CBC WITH DIFFERENTIAL/PLATELET
BASOS ABS: 0 10*3/uL (ref 0.0–0.1)
Basophils Relative: 0 %
EOS ABS: 0.6 10*3/uL (ref 0.0–0.7)
EOS PCT: 6 %
HCT: 25.9 % — ABNORMAL LOW (ref 39.0–52.0)
Hemoglobin: 8.1 g/dL — ABNORMAL LOW (ref 13.0–17.0)
Lymphocytes Relative: 19 %
Lymphs Abs: 1.9 10*3/uL (ref 0.7–4.0)
MCH: 29.1 pg (ref 26.0–34.0)
MCHC: 31.3 g/dL (ref 30.0–36.0)
MCV: 93.2 fL (ref 78.0–100.0)
MONO ABS: 1.1 10*3/uL — AB (ref 0.1–1.0)
Monocytes Relative: 11 %
Neutro Abs: 6.5 10*3/uL (ref 1.7–7.7)
Neutrophils Relative %: 64 %
PLATELETS: 439 10*3/uL — AB (ref 150–400)
RBC: 2.78 MIL/uL — AB (ref 4.22–5.81)
RDW: 14.3 % (ref 11.5–15.5)
WBC: 10.2 10*3/uL (ref 4.0–10.5)

## 2015-12-02 LAB — COMPREHENSIVE METABOLIC PANEL
ALT: 14 U/L — AB (ref 17–63)
AST: 29 U/L (ref 15–41)
Albumin: 1.7 g/dL — ABNORMAL LOW (ref 3.5–5.0)
Alkaline Phosphatase: 94 U/L (ref 38–126)
Anion gap: 16 — ABNORMAL HIGH (ref 5–15)
BUN: 47 mg/dL — ABNORMAL HIGH (ref 6–20)
CHLORIDE: 94 mmol/L — AB (ref 101–111)
CO2: 25 mmol/L (ref 22–32)
Calcium: 7.9 mg/dL — ABNORMAL LOW (ref 8.9–10.3)
Creatinine, Ser: 6.62 mg/dL — ABNORMAL HIGH (ref 0.61–1.24)
GFR calc non Af Amer: 7 mL/min — ABNORMAL LOW (ref >60)
GFR, EST AFRICAN AMERICAN: 9 mL/min — AB (ref >60)
Glucose, Bld: 136 mg/dL — ABNORMAL HIGH (ref 65–99)
POTASSIUM: 2.8 mmol/L — AB (ref 3.5–5.1)
SODIUM: 135 mmol/L (ref 135–145)
TOTAL PROTEIN: 5.6 g/dL — AB (ref 6.5–8.1)
Total Bilirubin: 0.7 mg/dL (ref 0.3–1.2)

## 2015-12-02 LAB — GLUCOSE, CAPILLARY
GLUCOSE-CAPILLARY: 161 mg/dL — AB (ref 65–99)
GLUCOSE-CAPILLARY: 184 mg/dL — AB (ref 65–99)
Glucose-Capillary: 158 mg/dL — ABNORMAL HIGH (ref 65–99)
Glucose-Capillary: 311 mg/dL — ABNORMAL HIGH (ref 65–99)

## 2015-12-02 MED ORDER — HEPARIN 1000 UNIT/ML FOR PERITONEAL DIALYSIS
1500.0000 [IU] | INTRAMUSCULAR | Status: DC | PRN
  Administered 2015-12-05 – 2015-12-06 (×5): 1500 [IU] via INTRAPERITONEAL
  Filled 2015-12-02 (×13): qty 1.5

## 2015-12-02 MED ORDER — POTASSIUM CHLORIDE CRYS ER 20 MEQ PO TBCR
40.0000 meq | EXTENDED_RELEASE_TABLET | ORAL | Status: AC
  Administered 2015-12-02 (×2): 40 meq via ORAL
  Filled 2015-12-02 (×2): qty 2

## 2015-12-02 MED ORDER — PIPERACILLIN-TAZOBACTAM IN DEX 2-0.25 GM/50ML IV SOLN
2.2500 g | Freq: Three times a day (TID) | INTRAVENOUS | Status: DC
  Administered 2015-12-02 – 2015-12-07 (×15): 2.25 g via INTRAVENOUS
  Filled 2015-12-02 (×17): qty 50

## 2015-12-02 MED ORDER — NEPRO/CARBSTEADY PO LIQD
237.0000 mL | Freq: Two times a day (BID) | ORAL | Status: DC
  Administered 2015-12-02 – 2015-12-07 (×6): 237 mL via ORAL

## 2015-12-02 NOTE — Care Management Important Message (Signed)
Important Message  Patient Details  Name: Jack Chung MRN: [id number] Date of Birth: [birth year]   Medicare Important Message Given:  Yes    Loann Quill 12/02/2015, 8:17 AM

## 2015-12-02 NOTE — Progress Notes (Signed)
   Daily Progress Note  Assessment/Planning: R great toe dry gangrene   Will plan on Right leg runoff, OA+PTA R SFA and TPT/peroneal on Thursday  Would consult [name] for mgmt of the R great toe  Paint R great toe with betadine for now   Subjective    Pain control ok  Objective Filed Vitals:   12/01/15 2015 12/01/15 2054 12/02/15 0424 12/02/15 0933  BP: 150/72 153/56 112/46 127/49  Pulse: 77 79 78 71  Temp:  97.5 F (36.4 C) 97.7 F (36.5 C) 97.8 F (36.6 C)  TempSrc:  Oral Oral Oral  Resp:  19 20 18   Height:  5\' 10"  (1.778 m)    Weight:  218 lb 6.4 oz (99.066 kg)    SpO2: 100% 99% 100% 100%    Intake/Output Summary (Last 24 hours) at 12/02/15 1030 Last data filed at 12/02/15 0855  Gross per 24 hour  Intake   7030 ml  Output   3200 ml  Net   3830 ml    VASC  R great toe with black eschar c/w dry gangrene  Laboratory CBC    Component Value Date/Time   WBC 10.2 12/02/2015 0655   HGB 8.1* 12/02/2015 0655   HCT 25.9* 12/02/2015 0655   PLT 439* 12/02/2015 0655    BMET    Component Value Date/Time   NA 135 12/02/2015 0655   K 2.8* 12/02/2015 0655   CL 94* 12/02/2015 0655   CO2 25 12/02/2015 0655   GLUCOSE 136* 12/02/2015 0655   BUN 47* 12/02/2015 0655   CREATININE 6.62* 12/02/2015 0655   CREATININE 4.14* 01/30/2014 1328   CALCIUM 7.9* 12/02/2015 0655   GFRNONAA 7* 12/02/2015 [place] 9* 12/02/2015 0655    Adele Barthel, MD [facility]: [phone] Pager: [phone]  12/02/2015, 10:30 AM

## 2015-12-02 NOTE — Consult Note (Addendum)
WOC consult requested for toe wound.  Vascular team has assessed and notes, "gangrenous right great toe with erythema present onto the dorsum of the foot." EMR indicates that vascular plans to follow for assessment and plan of care, refer to their team for further questions. Please re-consult if further assistance is needed.  Thank-you,  Julien Girt MSN, [place], [place], [place], [place]

## 2015-12-02 NOTE — Progress Notes (Signed)
VASCULAR LAB PRELIMINARY  ARTERIAL  ABI completed:    RIGHT    LEFT    PRESSURE WAVEFORM  PRESSURE WAVEFORM  BRACHIAL Unable to obtain due to A-V fistula  BRACHIAL 133 Triphasic  DP 155 Monophasic DP BKA   PT 148 Biphasic PT BKA     RIGHT LEFT  ABI 1.17 N/A   Right ABIs indicate normal arterial flow however abnormal Doppler waveforms may suggest a false elevation of pressures. Left ABIs could not be obtained. Patient has a below the knee amputation.  Jack Chung, RVS 12/02/2015, 4:26 PM

## 2015-12-02 NOTE — Consult Note (Signed)
Renal Service Consult Note [name]  Jack Chung 12/02/2015 Sol Blazing Requesting Physician:  Dr. Posey Pronto  Reason for Consult:  PD patient with gangrenous toe HPI: The patient is a 75 y.o. 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  F/B VVS , had L BKA in Jan and had RLE angiogram 5/25 by Dr. Bridgett Larsson.  Now here with worsening gangrene of R great toe.  Just started with hyperbaric program at [facility] this week for same. Has been on OP cipro w/ o imrpovement. Was here for foot infection in May as well.  Had a bone biopsy that showed Ecoli per H&P note.   Denies any f/c/s, no n/v/d or abd pain, no CP or SOB.  No recent probs with PD, does 4 exchanges during the day x 4 hr each then one 8 hr exchange overnight, all manual.  No specific c/o's.    ROS  no joint pain   no HA  no blurry vision  no rash  no dysuria  no difficulty voiding  no change in urine color    Past Medical History  Past Medical History  Diagnosis Date  . Hypertension   . Hypothyroidism   . CHF (congestive heart failure) ([place])   . Anemia   . Blind left eye   . Sleep apnea     not on CPAP  . Coronary artery disease     CABG x 11 Jun 2009.  MRSA infections of incsions  . Arthritis   . Sarcoidosis ([place])     Hx:of  . Diabetic retinopathy ([place])     Hx: of bilateral  . Edema   . Hyperlipidemia   . 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 Atherosclerosis of native arteries of the extremities, unspecified   . Eczema     Hx: of  . Insomnia   . Seasonal allergies   . History of blood transfusion     with heart surgery  . Coronary atherosclerosis of native coronary artery   . Atherosclerosis of native arteries of the extremities, unspecified   . Pneumonia   . Diabetes mellitus     type 2  . ESRD on dialysis [facility])     peritoneal dialysis - 5 times a time 9a, 12n, 3p, 6p, 9p  . GERD  (gastroesophageal reflux disease)   . Neuropathy ([place])   . Cancer ([place])     skin cancer - melanoma on back  . Gangrene [facility])    Past Surgical History  Past Surgical History  Procedure Laterality Date  . Cardiac surgery      total of 6 surgeries, 5 related to mrsa  . Coronary artery bypass graft  06/2009  . Cataract surgery      Hx: of  . Debridements      Hx: of secondary to MRSA  . Av fistula placement Right 10/11/2012    Procedure: ARTERIOVENOUS (AV) FISTULA CREATION;  Surgeon: Rosetta Posner, MD;  Location: [place];  Service: Vascular;  Laterality: Right;  . Insertion of dialysis catheter Left   . Capd insertion N/A 11/28/2012    Procedure: LAPAROSCOPIC PERITONEAL DIALYSIS CATHETER PLACEMENT;  Surgeon: Ralene Ok, MD;  Location: [place];  Service: General;  Laterality: N/A;  . Left heart catheterization with coronary/graft angiogram N/A 01/31/2014    Procedure: LEFT HEART CATHETERIZATION WITH CORONARY/GRAFT ANGIOGRAM;  Surgeon: Joyice Faster  Claiborne Billings, MD;  Location: [facility] CATH LAB;  Service: Cardiovascular;  Laterality: N/A;  . Peripheral vascular catheterization N/A 04/28/2015    Procedure: Abdominal Aortogram w/Lower Extremity;  Surgeon: Conrad Shellsburg, MD;  Location: [place] CV LAB;  Service: Cardiovascular;  Laterality: N/A;  . Amputation Left 04/30/2015    Procedure: 1st Ray Amputation Left Foot;  Surgeon: Newt Minion, MD;  Location: [place];  Service: Orthopedics;  Laterality: Left;  . Esophagogastroduodenoscopy N/A 05/20/2015    Procedure: ESOPHAGOGASTRODUODENOSCOPY (EGD);  Surgeon: Wilford Corner, MD;  Location: [facility] ENDOSCOPY;  Service: Endoscopy;  Laterality: N/A;  Possible esophageal dilatation  . Eye surgery Bilateral     cataract surgery with lens implants  . Amputation Left 06/13/2015    Procedure: Left Below Knee Amputation;  Surgeon: Newt Minion, MD;  Location: [place];  Service: Orthopedics;  Laterality: Left;  . Peripheral vascular catheterization N/A 10/30/2015    Procedure:  Abdominal Aortogram;  Surgeon: Conrad Armstrong, MD;  Location: [place] CV LAB;  Service: Cardiovascular;  Laterality: N/A;  . Lower extremity angiogram Right 10/30/2015    Procedure: Lower Extremity Angiogram;  Surgeon: Conrad Sophia, MD;  Location: [place] CV LAB;  Service: Cardiovascular;  Laterality: Right;   Family History  Family History  Problem Relation Age of Onset  . COPD Mother   . COPD Father    Social History  reports that he has never smoked. He has never used smokeless tobacco. He reports that he does not drink alcohol or use illicit drugs. Allergies  Allergies  Allergen Reactions  . Flu Virus Vaccine Other (See Comments)    Gets the flu, patient has bad reaction to the flu vaccine (gets the flu)  . Lipitor [Atorvastatin] Other (See Comments)    Leg cramps and liver enzymes go "out of whack"  . Other Diarrhea and Nausea And Vomiting    NO TREE NUTS!!  . Peanut-Containing Drug Products Nausea And Vomiting   Home medications Prior to Admission medications   Medication Sig Start Date End Date Taking? Authorizing Provider  allopurinol (ZYLOPRIM) 100 MG tablet Take 100 mg by mouth daily.   Yes Historical Provider, MD  aspirin 325 MG EC tablet Take 325 mg by mouth daily.   Yes Historical Provider, MD  calcium carbonate (TUMS - DOSED IN MG ELEMENTAL CALCIUM) 500 MG chewable tablet Chew 2 tablets by mouth 3 (three) times daily with meals.    Yes Historical Provider, MD  cetirizine (ZYRTEC) 10 MG tablet Take 10 mg by mouth daily as needed for allergies.    Yes Historical Provider, MD  ciprofloxacin (CIPRO) 500 MG tablet Take 500 mg by mouth 2 (two) times daily. 11/28/15  Yes Historical Provider, MD  fluconazole (DIFLUCAN) 100 MG tablet Take 100 mg by mouth daily. Start date 11/24/15 to last 7 days 11/24/15  Yes Historical Provider, MD  gentamicin cream (GARAMYCIN) 0.1 % Apply 1 application topically daily. Applies to stomach.   Yes Historical Provider, MD  insulin aspart  (NOVOLOG) 100 UNIT/ML injection Inject 10 Units into the skin 3 (three) times daily with meals. To give 10 units PLUS SSI -Take none if blood sugar < 150, 151-200 =  2 units, 201-250 = 4 units, 251-300 = 6 units, 301-350 = 8 units   Yes Historical Provider, MD  Insulin Glargine (LANTUS SOLOSTAR) 100 UNIT/ML Solostar Pen Inject 20 Units into the skin daily with breakfast.   Yes Historical Provider, MD  Lactobacillus (ACIDOPHILUS PROBIOTIC PO) Take 1  tablet by mouth daily.    Yes Historical Provider, MD  lactulose (CHRONULAC) 10 GM/15ML solution Take 10 g by mouth every 4 (four) hours as needed for mild constipation.   Yes Historical Provider, MD  levothyroxine (SYNTHROID, LEVOTHROID) 112 MCG tablet LABS/APPOINTMENT OVERDUE, 1 by mouth daily in the morning 30 minutes before breakfast for Thyroid Patient taking differently: Take 112 mcg by mouth daily before breakfast.  12/10/13  Yes Tiffany L Reed, DO  loperamide (IMODIUM) 2 MG capsule Take 2 mg by mouth daily as needed for diarrhea or loose stools.   Yes Historical Provider, MD  metoCLOPramide (REGLAN) 5 MG tablet Take 2 tablets (10 mg total) by mouth 3 (three) times daily before meals. 05/02/15  Yes Costin Karlyne Greenspan, MD  midodrine (PROAMATINE) 10 MG tablet Take 1 tablet (10 mg total) by mouth 3 (three) times daily. Patient taking differently: Take 10 mg by mouth every morning.  03/11/15  Yes Pixie Casino, MD  mirtazapine (REMERON) 15 MG tablet Take 15 mg by mouth at bedtime.  09/17/15  Yes Historical Provider, MD  multivitamin (RENA-VIT) TABS tablet Take 1 tablet by mouth daily.   Yes Historical Provider, MD  Nutritional Supplements (FEEDING SUPPLEMENT, NEPRO CARB STEADY,) LIQD Drink 1 can by mouth once daily   Yes Historical Provider, MD  omeprazole (PRILOSEC) 10 MG capsule Take 10 mg by mouth daily.    Yes Historical Provider, MD  OVER THE COUNTER MEDICATION LiquiCell: Mix in orange juice and drink daily (afternoon or evening)   Yes Historical  Provider, MD  potassium chloride SA (K-DUR,KLOR-CON) 20 MEQ tablet Take 20 mEq by mouth daily.   Yes Historical Provider, MD  predniSONE (DELTASONE) 20 MG tablet Take 20 mg by mouth daily as needed (for joint pain from R.A. and O.A.).   Yes Historical Provider, MD  UNABLE TO FIND Peritoneal dialysis: Every 4 waking hours (0800, 1200, 1600, 2000)   Yes Historical Provider, MD  nitroGLYCERIN (NITRODUR - DOSED IN MG/24 HR) 0.2 mg/hr patch Place patch on the top of right foot every 24 hours, remove the previous patch. Patient taking differently: Place 0.2 mg onto the skin daily. Place patch on the top of right foot every 24 hours, remove the previous patch. 10/17/15   Sharmon Leyden Nickel, NP   Liver Function Tests  Recent Labs Lab 12/02/15 0655  AST 29  ALT 14*  ALKPHOS 94  BILITOT 0.7  PROT 5.6*  ALBUMIN 1.7*   No results for input(s): LIPASE, AMYLASE in the last 168 hours. CBC  Recent Labs Lab 12/01/15 1237 12/02/15 0655  WBC 12.3* 10.2  NEUTROABS 9.0* 6.5  HGB 11.0* 8.1*  HCT 34.8* 25.9*  MCV 95.6 93.2  PLT 414* 123456*   Basic Metabolic Panel  Recent Labs Lab 12/01/15 1237 12/02/15 0655  NA 133* 135  K 3.3* 2.8*  CL 92* 94*  CO2 27 25  GLUCOSE 52* 136*  BUN 47* 47*  CREATININE 6.43* 6.62*  CALCIUM 8.3* 7.9*   Iron/TIBC/Ferritin/ %Sat    Component Value Date/Time   IRON 25* 10/30/2012 1600   TIBC 179* 10/30/2012 1600   FERRITIN 641* 10/30/2012 1600   IRONPCTSAT 14* 10/30/2012 1600    Filed Vitals:   12/01/15 2015 12/01/15 2054 12/02/15 0424 12/02/15 0933  BP: 150/72 153/56 112/46 127/49  Pulse: 77 79 78 71  Temp:  97.5 F (36.4 C) 97.7 F (36.5 C) 97.8 F (36.6 C)  TempSrc:  Oral Oral Oral  Resp:  19 20 18  Height:  5\' 10"  (1.778 m)    Weight:  99.066 kg (218 lb 6.4 oz)    SpO2: 100% 99% 100% 100%   Exam Gen alert elderly WM no distress No rash, cyanosis or gangrene Sclera anicteric, throat clear and moist  No jvd or bruits Chest clear bilat RRR  soft SEM no RG Abd soft ntnd no mass or ascites +bs PD cath LLQ clear exit site GU normal male MS no joint effusions or deformity Ext no LE edema / L BKA/ R great toe is gangrenous  wounds or ulcers Neuro is alert, Ox 3 , nf     Dialysis: CAPD edw 96kg  4 exchanges of 3000 during the day with 4h dwell, overnight 2500 x 8 hrs  Assessment: 1  Gangrene R great toe / PVD 2  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

## 2015-12-02 NOTE — Care Management Note (Signed)
Case Management Note  Patient Details  Name: Jack Chung MRN: [id number] Date of Birth: [birth year]  Subjective/Objective:       CM following for progression and d/c planning.              Action/Plan: 12/02/15 Pt was recently active with [facility] for [facility] services, they will provide services at the time of d/c if appropriate. Will continue to follow.   Expected Discharge Date:  12/05/15               Expected Discharge Plan:  [place]  In-House Referral:     Discharge planning Services  CM Consult  Post Acute Care Choice:  Home Health Choice offered to:  Patient  DME Arranged:    DME Agency:     HH Arranged:  RN, PT [place] Agency:  Other - See comment ([facility])  Status of Service:  In process, will continue to follow  If discussed at Long Length of Stay Meetings, dates discussed:    Additional Comments:  Adron Bene, RN 12/02/2015, 3:38 PM

## 2015-12-02 NOTE — Progress Notes (Signed)
[facility] Progress Note  Patient: Jack Chung [id number]   PCP: [name], MD DOB: 10/22/1940   DOA: 12/01/2015   DOS: 12/02/2015   Date of Service: the patient was seen and examined on 12/02/2015  Subjective: Resting comfortably with no pain.   Nutrition: tolerating oral diet  Brief hospital course: Pt. with PMH of IDDM, diabetic neuropathy, ESRD on PD, HTN, hypothyroidism, CHF, CAD, CABG, hyperlipidemia, sarcoidosis, Left BKA, arthritis ; admitted on 12/01/2015, with complaint of worsening wound to right great toe, was found to have cellulitis of the right lower leg. Managed at wound care and referred to the hospital. Treated with Ciprofloxacin. 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 Started on Zosyn and Vancomycin. Vascular surgery consulted, ordered CTA of right leg. Plan to perform PTA on RT superficial femoral artery and tibioperoneal trunk on 12/04/15. Patient refuses amputation.  Currently further plan is continue ABX and monitoring symptoms.  Assessment and Plan:  Cellulitis of right lower extremity Continue Zosyn and Vancomycin Vascular following, will preform PTA on Thursday and ordered CT runoff. ESR 127, CRP 11.8 Blood cultures Pending.  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 Asymptomatic and no signs of acute blood loss Monitor hematocrit and hemoglobin.  ESRD on peritoneal dialysis [facility]) Nephrology was consulted for PD orders.  Hypokalemia: 2.8 today will replete CMP in the morning  Hypoproteinemia Will continue to monitor  CMP in the morning  Gout Continue allopurinol.  CAD (coronary artery disease) Stable at this time. Continue aspirin.  Type 2 diabetes mellitus with polyneuropathy (HCC) Carbohydratemodified/renal diet. Continue Lantus 20 units at breakfast and regular insulin sliding scale. Continue CBG monitoring  Check hemoglobin A1c.  Hypothyroidism Continue levothyroxine.  Pain management: Tylenol Activity:  physical therapyconsulted  Bowel regimen: last BM 6/27 Diet: Renal and carb modified  DVT Prophylaxis: subcutaneous Heparin and mechanical compression device.  Advance goals of care discussion: Full code  Family Communication: No family at bedside  Disposition:  Discharge to home. Expected discharge date: dependent on ABX response and Vascular recommendation.  Consultants: Vascular surgery Procedures: None  Antibiotics: Anti-infectives    Start     Dose/Rate Route Frequency Ordered Stop   12/02/15 1400  piperacillin-tazobactam (ZOSYN) IVPB 2.25 g     2.25 g 100 mL/hr over 30 Minutes Intravenous Every 8 hours 12/02/15 1038     12/02/15 1000  fluconazole (DIFLUCAN) tablet 100 mg     100 mg Oral Daily 12/01/15 2106 12/04/15 0959   12/02/15 0700  piperacillin-tazobactam (ZOSYN) IVPB 2.25 g  Status:  Discontinued     2.25 g 100 mL/hr over 30 Minutes Intravenous Every 12 hours 12/01/15 2141 12/02/15 1038   12/01/15 2300  vancomycin (VANCOCIN) 2,000 mg in sodium chloride 0.9 % 500 mL IVPB     2,000 mg 250 mL/hr over 120 Minutes Intravenous  Once 12/01/15 2141 12/02/15 0012   12/01/15 1900  piperacillin-tazobactam (ZOSYN) IVPB 3.375 g     3.375 g 100 mL/hr over 30 Minutes Intravenous  Once 12/01/15 1857 12/01/15 1953        Intake/Output Summary (Last 24 hours) at 12/02/15 1108 Last data filed at 12/02/15 0855  Gross per 24 hour  Intake   7030 ml  Output   3200 ml  Net   3830 ml   Filed Weights   12/01/15 2054  Weight: 99.066 kg (218 lb 6.4 oz)    Objective: Physical Exam: Filed Vitals:   12/01/15 2015 12/01/15 2054 12/02/15 0424 12/02/15 0933  BP: 150/72 153/56 112/46 127/49  Pulse: 77 79 78 71  Temp:  97.5 F (36.4 C) 97.7 F (36.5 C) 97.8 F (36.6 C)  TempSrc:  Oral Oral Oral  Resp:  '19 20 18  ' Height:  '5\' 10"'  (1.778 m)    Weight:  99.066 kg (218 lb 6.4 oz)    SpO2: 100% 99% 100% 100%    General: Alert, Awake and Oriented to Time, Place and Person. Appear  in no distress Eyes: PERRL, Conjunctiva normal ENT: Oral Mucosa clear moist. Cardiovascular: S1 and S2 Present, no Murmur, Respiratory: Bilateral Air entry equal and Decreased, Clear to Auscultation Abdomen: Bowel Sound present, Soft and no tenderness Skin: Black patches to right great toe with surrounding redness, no Rash  Extremities: +1 Pedal edema, no calf tenderness Neurologic: Grossly no focal neuro deficit. Bilaterally Equal motor strength  Data Reviewed: CBC:  Recent Labs Lab 12/01/15 1237 12/02/15 0655  WBC 12.3* 10.2  NEUTROABS 9.0* 6.5  HGB 11.0* 8.1*  HCT 34.8* 25.9*  MCV 95.6 93.2  PLT 414* 366*   Basic Metabolic Panel:  Recent Labs Lab 12/01/15 1237 12/02/15 0655  NA 133* 135  K 3.3* 2.8*  CL 92* 94*  CO2 27 25  GLUCOSE 52* 136*  BUN 47* 47*  CREATININE 6.43* 6.62*  CALCIUM 8.3* 7.9*    Liver Function Tests:  Recent Labs Lab 12/02/15 0655  AST 29  ALT 14*  ALKPHOS 94  BILITOT 0.7  PROT 5.6*  ALBUMIN 1.7*   No results for input(s): LIPASE, AMYLASE in the last 168 hours. No results for input(s): AMMONIA in the last 168 hours. Coagulation Profile: No results for input(s): INR, PROTIME in the last 168 hours. Cardiac Enzymes: No results for input(s): CKTOTAL, CKMB, CKMBINDEX, TROPONINI in the last 168 hours. BNP (last 3 results) No results for input(s): PROBNP in the last 8760 hours.  CBG:  Recent Labs Lab 11/28/15 0729 12/01/15 0734 12/01/15 0951 12/01/15 2033 12/02/15 0751  GLUCAP 184* 343* 148* 109* 161*    Studies: No results found.   Scheduled Meds: . allopurinol  100 mg Oral Daily  . aspirin  325 mg Oral Daily  . calcium carbonate  2 tablet Oral TID WC  . dialysis solution 2.5% low-MG/low-CA   Intraperitoneal Q6H  . enoxaparin (LOVENOX) injection  30 mg Subcutaneous Q24H  . feeding supplement (NEPRO CARB STEADY)  237 mL Oral Q24H  . fluconazole  100 mg Oral Daily  . gentamicin cream  1 application Topical QPM  .  insulin aspart  0-9 Units Subcutaneous TID WC  . insulin glargine  20 Units Subcutaneous Q breakfast  . levothyroxine  112 mcg Oral QAC breakfast  . loratadine  10 mg Oral Daily  . metoCLOPramide  10 mg Oral TID AC  . midodrine  10 mg Oral Q breakfast  . mirtazapine  15 mg Oral QHS  . multivitamin  1 tablet Oral QHS  . nitroGLYCERIN  0.2 mg Transdermal Daily  . pantoprazole  40 mg Oral Daily  . piperacillin-tazobactam (ZOSYN)  IV  2.25 g Intravenous Q8H  . potassium chloride  40 mEq Oral Q2H   Continuous Infusions:  PRN Meds: heparin, lactulose, loperamide, ondansetron **OR** ondansetron (ZOFRAN) IV  Time spent: 30 minutes  Author: Grayland Ormond PA-S   If 7PM-7AM, please contact night-coverage at [email or web address], password [facility]  Supervising attending attestation I have interviewed and examined the patient and agree with the documentation and management as recorded by the Advanced Practice Provider. I have made any necessary editorial changes.I  have independently reviewed the clinical findings, lab results and imaging studies.  family was present at bedside, opportunity was given to ask question and all questions were answered satisfactorily at the time of interview.  Author: Berle Mull, MD [facility] 12/02/2015 12:44 PM

## 2015-12-02 NOTE — Progress Notes (Addendum)
Initial Nutrition Assessment  DOCUMENTATION CODES:   Obesity unspecified  INTERVENTION:  Provide Nepro Shake po BID, each supplement provides 425 kcal and 19 grams protein.  Encourage adequate PO intake.   NUTRITION DIAGNOSIS:   Increased nutrient needs related to wound healing as evidenced by estimated needs.  GOAL:   Patient will meet greater than or equal to 90% of their needs  MONITOR:   PO intake, Supplement acceptance, Weight trends, I & O's, Labs, Skin  REASON FOR ASSESSMENT:   Consult Wound healing  ASSESSMENT:   75 y.o. male with medical history significant of type 2 diabetes, diabetic peripheral neuropathy, ESRD on PD, left BKA, hypertension, hypothyroidism, CHF, CAD, CABG, arthritis, sarcoidosis, hyperlipidemia, seasonal allergies who was referred from the wound clinic for evaluation of worsening chronic right foot infection.  Pt with dry gangrene of R great toe.   Meal completion has been 90-100%. Pt reports having a good appetite currently and PTA with usual consumption of at least 2-3 meals a day with Nepro Shakes at least twice daily. Usual body weight reported to be ~215 lbs. Per Epic weight records, weight has been fluctuating however is likely related with fluid status. Pt currently has Nepro Shake ordered. RD to increase orders to BID to aid in wound healing. Pt educated on adequate protein intake to promote healing. Pt expressed understanding.   Pt with no observed significant fat or muscle mass loss.   Labs and medications reviewed.   Diet Order:  Diet renal/carb modified with fluid restriction Diet-HS Snack?: Nothing; Room service appropriate?: Yes; Fluid consistency:: Thin  Skin:  Wound (see comment) (Dry gangrene R great toe)  Last BM:  6/27  Height:   Ht Readings from Last 1 Encounters:  12/01/15 5\' 10"  (1.778 m)    Weight:   Wt Readings from Last 1 Encounters:  12/01/15 218 lb 6.4 oz (99.066 kg)    Ideal Body Weight:  70.55 kg  (adjusted for L BKA)  BMI:  Body mass index is 31.34 kg/(m^2).  Estimated Nutritional Needs:   Kcal:  2000-2300  Protein:  115-130 grams  Fluid:  1.2 L/day  EDUCATION NEEDS:   Education needs addressed  Corrin Parker, MS, RD, LDN Pager # [phone] After hours/ weekend pager # [phone]

## 2015-12-03 LAB — GLUCOSE, CAPILLARY
GLUCOSE-CAPILLARY: 255 mg/dL — AB (ref 65–99)
GLUCOSE-CAPILLARY: 346 mg/dL — AB (ref 65–99)
Glucose-Capillary: 167 mg/dL — ABNORMAL HIGH (ref 65–99)
Glucose-Capillary: 308 mg/dL — ABNORMAL HIGH (ref 65–99)

## 2015-12-03 LAB — HEMOGLOBIN A1C
HEMOGLOBIN A1C: 6.9 % — AB (ref 4.8–5.6)
MEAN PLASMA GLUCOSE: 151 mg/dL

## 2015-12-03 LAB — COMPREHENSIVE METABOLIC PANEL
ALBUMIN: 1.6 g/dL — AB (ref 3.5–5.0)
ALK PHOS: 96 U/L (ref 38–126)
ALT: 14 U/L — AB (ref 17–63)
AST: 24 U/L (ref 15–41)
Anion gap: 10 (ref 5–15)
BILIRUBIN TOTAL: 0.4 mg/dL (ref 0.3–1.2)
BUN: 42 mg/dL — AB (ref 6–20)
CALCIUM: 7.5 mg/dL — AB (ref 8.9–10.3)
CO2: 27 mmol/L (ref 22–32)
CREATININE: 6.41 mg/dL — AB (ref 0.61–1.24)
Chloride: 95 mmol/L — ABNORMAL LOW (ref 101–111)
GFR calc Af Amer: 9 mL/min — ABNORMAL LOW (ref >60)
GFR calc non Af Amer: 8 mL/min — ABNORMAL LOW (ref >60)
GLUCOSE: 240 mg/dL — AB (ref 65–99)
POTASSIUM: 3.4 mmol/L — AB (ref 3.5–5.1)
Sodium: 132 mmol/L — ABNORMAL LOW (ref 135–145)
TOTAL PROTEIN: 5.3 g/dL — AB (ref 6.5–8.1)

## 2015-12-03 LAB — CBC WITH DIFFERENTIAL/PLATELET
BASOS ABS: 0 10*3/uL (ref 0.0–0.1)
BASOS PCT: 1 %
Eosinophils Absolute: 0.5 10*3/uL (ref 0.0–0.7)
Eosinophils Relative: 6 %
HEMATOCRIT: 29 % — AB (ref 39.0–52.0)
HEMOGLOBIN: 9 g/dL — AB (ref 13.0–17.0)
LYMPHS PCT: 17 %
Lymphs Abs: 1.4 10*3/uL (ref 0.7–4.0)
MCH: 29 pg (ref 26.0–34.0)
MCHC: 31 g/dL (ref 30.0–36.0)
MCV: 93.5 fL (ref 78.0–100.0)
Monocytes Absolute: 0.9 10*3/uL (ref 0.1–1.0)
Monocytes Relative: 10 %
NEUTROS ABS: 5.4 10*3/uL (ref 1.7–7.7)
NEUTROS PCT: 66 %
Platelets: 358 10*3/uL (ref 150–400)
RBC: 3.1 MIL/uL — ABNORMAL LOW (ref 4.22–5.81)
RDW: 14.4 % (ref 11.5–15.5)
WBC: 8.2 10*3/uL (ref 4.0–10.5)

## 2015-12-03 LAB — RENAL FUNCTION PANEL
Albumin: 1.6 g/dL — ABNORMAL LOW (ref 3.5–5.0)
Anion gap: 10 (ref 5–15)
BUN: 42 mg/dL — ABNORMAL HIGH (ref 6–20)
CHLORIDE: 95 mmol/L — AB (ref 101–111)
CO2: 26 mmol/L (ref 22–32)
CREATININE: 6.23 mg/dL — AB (ref 0.61–1.24)
Calcium: 7.4 mg/dL — ABNORMAL LOW (ref 8.9–10.3)
GFR calc non Af Amer: 8 mL/min — ABNORMAL LOW (ref >60)
GFR, EST AFRICAN AMERICAN: 9 mL/min — AB (ref >60)
Glucose, Bld: 239 mg/dL — ABNORMAL HIGH (ref 65–99)
PHOSPHORUS: 4 mg/dL (ref 2.5–4.6)
Potassium: 3.4 mmol/L — ABNORMAL LOW (ref 3.5–5.1)
Sodium: 131 mmol/L — ABNORMAL LOW (ref 135–145)

## 2015-12-03 LAB — MAGNESIUM: MAGNESIUM: 1.4 mg/dL — AB (ref 1.7–2.4)

## 2015-12-03 MED ORDER — MAGNESIUM SULFATE 2 GM/50ML IV SOLN
2.0000 g | Freq: Once | INTRAVENOUS | Status: AC
  Administered 2015-12-03: 2 g via INTRAVENOUS
  Filled 2015-12-03: qty 50

## 2015-12-03 MED ORDER — POTASSIUM CHLORIDE CRYS ER 20 MEQ PO TBCR
40.0000 meq | EXTENDED_RELEASE_TABLET | Freq: Once | ORAL | Status: AC
  Administered 2015-12-03: 40 meq via ORAL
  Filled 2015-12-03: qty 2

## 2015-12-03 NOTE — Progress Notes (Signed)
   Preoperative Note   Procedure: R leg runoff, R SFA and TPT/peroneal orbital atherectomy and angioplasty  Date: 12/04/15  Preoperative diagnosis: R foot gangrene  Consent: to be obtained  Laboratory:  CBC:    Component Value Date/Time   WBC 8.2 12/03/2015 0540   RBC 3.10* 12/03/2015 0540   RBC 3.34* 10/05/2012 0340   HGB 9.0* 12/03/2015 0540   HCT 29.0* 12/03/2015 0540   PLT 358 12/03/2015 0540   MCV 93.5 12/03/2015 0540   MCH 29.0 12/03/2015 0540   MCHC 31.0 12/03/2015 0540   RDW 14.4 12/03/2015 0540   LYMPHSABS 1.4 12/03/2015 0540   MONOABS 0.9 12/03/2015 0540   EOSABS 0.5 12/03/2015 0540   BASOSABS 0.0 12/03/2015 0540    BMP:    Component Value Date/Time   NA 131* 12/03/2015 0550   K 3.4* 12/03/2015 0550   CL 95* 12/03/2015 0550   CO2 26 12/03/2015 0550   GLUCOSE 239* 12/03/2015 0550   BUN 42* 12/03/2015 0550   CREATININE 6.23* 12/03/2015 0550   CREATININE 4.14* 01/30/2014 1328   CALCIUM 7.4* 12/03/2015 0550   GFRNONAA 8* 12/03/2015 0550   GFRAA 9* 12/03/2015 0550    Coagulation: Lab Results  Component Value Date   INR 1.08 06/13/2015   INR 1.02 04/29/2015   INR 0.95 01/30/2014   No results found for: PTT  Adele Barthel, MD [facility] [place] Office: [phone] Pager: [phone]  12/03/2015, 7:55 AM

## 2015-12-03 NOTE — Progress Notes (Signed)
[facility] Progress Note  Patient: Jack Chung:[id number]   PCP: [name], MD DOB: 1940-10-30   DOA: 12/01/2015   DOS: 12/03/2015   Date of Service: the patient was seen and examined on 12/03/2015  Subjective: Patient denies any acute pain. Nausea and vomiting no chest pain and abdominal pain. Nutrition: tolerating oral diet  Brief hospital course: Pt. with PMH of IDDM, diabetic neuropathy, ESRD on PD, HTN, hypothyroidism, CHF, CAD, CABG, hyperlipidemia, sarcoidosis, Left BKA, arthritis ; admitted on 12/01/2015, with complaint of worsening wound to right great toe, was found to have cellulitis of the right lower leg. Managed at wound care and referred to the hospital. Treated with Ciprofloxacin. 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 Started on Zosyn and Vancomycin. Vascular surgery consulted, ordered CTA of right leg. Plan to perform PTA on RT superficial femoral artery and tibioperoneal trunk on 12/04/15. Patient refuses amputation.  Currently further plan is continue ABX and monitoring symptoms.  Assessment and Plan:  Cellulitis of right lower extremity Continue Zosyn and Vancomycin Vascular following, will Perform PTA on Friday and ordered CT runoff. ESR 127, CRP 11.8 Blood cultures Pending. I discussed with patient and the family and they would like to avoid amputation of the toe as during similar situation on the left leg patient ended up getting a BKA due to poor circulation. Family would only consider amputation if vascular surgery recommends circulation is adequate. Based on this discussion currently holding orthopedic consult. I also discussed option of focusing on comfort instead of active treatment if family decided against amputation after the vascular procedure..  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 Asymptomatic and no signs of acute blood loss Monitor hematocrit and hemoglobin.  ESRD on peritoneal dialysis [facility]) Nephrology was  consulted for PD orders.  Hypokalemia: Replacing, recheck in the morning.  Hypoproteinemia Will continue to monitor  CMP in the morning  Gout Continue allopurinol.  CAD (coronary artery disease) Stable at this time. Continue aspirin.  Type 2 diabetes mellitus with polyneuropathy (HCC) Carbohydratemodified/renal diet. Continue Lantus 20 units at breakfast and regular insulin sliding scale. Continue CBG monitoring  Check hemoglobin A1c.  Hypothyroidism Continue levothyroxine.  Pain management: Tylenol Activity: physical therapy recommends home with home health Bowel regimen: last BM 6/27 Diet: Renal and carb modified  DVT Prophylaxis: subcutaneous Heparin and mechanical compression device.  Advance goals of care discussion: Full code  Family Communication: family at bedside  Disposition:  Discharge to home. Expected discharge date: dependent on ABX response and Vascular recommendation.  Consultants: Vascular surgery, nephrology Procedures: None  Antibiotics: Anti-infectives    Start     Dose/Rate Route Frequency Ordered Stop   12/02/15 1400  piperacillin-tazobactam (ZOSYN) IVPB 2.25 g     2.25 g 100 mL/hr over 30 Minutes Intravenous Every 8 hours 12/02/15 1038     12/02/15 1000  fluconazole (DIFLUCAN) tablet 100 mg     100 mg Oral Daily 12/01/15 2106 12/03/15 1215   12/02/15 0700  piperacillin-tazobactam (ZOSYN) IVPB 2.25 g  Status:  Discontinued     2.25 g 100 mL/hr over 30 Minutes Intravenous Every 12 hours 12/01/15 2141 12/02/15 1038   12/01/15 2300  vancomycin (VANCOCIN) 2,000 mg in sodium chloride 0.9 % 500 mL IVPB     2,000 mg 250 mL/hr over 120 Minutes Intravenous  Once 12/01/15 2141 12/02/15 0012   12/01/15 1900  piperacillin-tazobactam (ZOSYN) IVPB 3.375 g     3.375 g 100 mL/hr over 30 Minutes Intravenous  Once 12/01/15 1857 12/01/15 1953  Intake/Output Summary (Last 24 hours) at 12/03/15 1813 Last data filed at 12/03/15 1310  Gross per 24  hour  Intake   8770 ml  Output  10200 ml  Net  -1430 ml   Filed Weights   12/01/15 2054 12/02/15 1900  Weight: 99.066 kg (218 lb 6.4 oz) 101.334 kg (223 lb 6.4 oz)    Objective: Physical Exam: Filed Vitals:   12/03/15 0440 12/03/15 0840 12/03/15 1203 12/03/15 1730  BP: 114/50 120/67 112/53 116/55  Pulse: 81 85 78 74  Temp: 97.8 F (36.6 C) 98.1 F (36.7 C) 97.8 F (36.6 C) 97.5 F (36.4 C)  TempSrc: Oral Oral Oral Oral  Resp: '18 18 16 18  ' Height:      Weight:      SpO2: 98% 97% 99% 99%    General: Alert, Awake and Oriented to Time, Place and Person. Appear in no distress Eyes: PERRL, Conjunctiva normal ENT: Oral Mucosa clear moist. Cardiovascular: S1 and S2 Present, no Murmur, Respiratory: Bilateral Air entry equal and Decreased, Clear to Auscultation Abdomen: Bowel Sound present, Soft and no tenderness Skin: Black patches to right great toe with surrounding redness, no Rash  Extremities: +1 Pedal edema, no calf tenderness Neurologic: Grossly no focal neuro deficit. Bilaterally Equal motor strength  Data Reviewed: CBC:  Recent Labs Lab 12/01/15 1237 12/02/15 0655 12/03/15 0540  WBC 12.3* 10.2 8.2  NEUTROABS 9.0* 6.5 5.4  HGB 11.0* 8.1* 9.0*  HCT 34.8* 25.9* 29.0*  MCV 95.6 93.2 93.5  PLT 414* 439* 161   Basic Metabolic Panel:  Recent Labs Lab 12/01/15 1237 12/02/15 0655 12/03/15 0540 12/03/15 0550  NA 133* 135 132* 131*  K 3.3* 2.8* 3.4* 3.4*  CL 92* 94* 95* 95*  CO2 '27 25 27 26  ' GLUCOSE 52* 136* 240* 239*  BUN 47* 47* 42* 42*  CREATININE 6.43* 6.62* 6.41* 6.23*  CALCIUM 8.3* 7.9* 7.5* 7.4*  MG  --   --  1.4*  --   PHOS  --   --   --  4.0    Liver Function Tests:  Recent Labs Lab 12/02/15 0655 12/03/15 0540 12/03/15 0550  AST 29 24  --   ALT 14* 14*  --   ALKPHOS 94 96  --   BILITOT 0.7 0.4  --   PROT 5.6* 5.3*  --   ALBUMIN 1.7* 1.6* 1.6*   No results for input(s): LIPASE, AMYLASE in the last 168 hours. No results for input(s):  AMMONIA in the last 168 hours. Coagulation Profile: No results for input(s): INR, PROTIME in the last 168 hours. Cardiac Enzymes: No results for input(s): CKTOTAL, CKMB, CKMBINDEX, TROPONINI in the last 168 hours. BNP (last 3 results) No results for input(s): PROBNP in the last 8760 hours.  CBG:  Recent Labs Lab 12/02/15 1715 12/02/15 2014 12/03/15 0805 12/03/15 1240 12/03/15 1720  GLUCAP 184* 311* 255* 167* 308*    Studies: No results found.   Scheduled Meds: . allopurinol  100 mg Oral Daily  . aspirin  325 mg Oral Daily  . calcium carbonate  2 tablet Oral TID WC  . dialysis solution 2.5% low-MG/low-CA   Intraperitoneal Q6H  . feeding supplement (NEPRO CARB STEADY)  237 mL Oral BID BM  . gentamicin cream  1 application Topical QPM  . insulin aspart  0-9 Units Subcutaneous TID WC  . insulin glargine  20 Units Subcutaneous Q breakfast  . levothyroxine  112 mcg Oral QAC breakfast  . loratadine  10 mg  Oral Daily  . metoCLOPramide  10 mg Oral TID AC  . midodrine  10 mg Oral Q breakfast  . mirtazapine  15 mg Oral QHS  . multivitamin  1 tablet Oral QHS  . nitroGLYCERIN  0.2 mg Transdermal Daily  . pantoprazole  40 mg Oral Daily  . piperacillin-tazobactam (ZOSYN)  IV  2.25 g Intravenous Q8H   Continuous Infusions:  PRN Meds: heparin, heparin, lactulose, loperamide, ondansetron **OR** ondansetron (ZOFRAN) IV  Time spent: 30 minutes  Author: Berle Mull, MD [facility] 12/03/2015 6:13 PM

## 2015-12-03 NOTE — Evaluation (Signed)
Physical Therapy Evaluation Patient Details Name: Jack Chung MRN: [id number] DOB: 19-Apr-1941 Today's Date: 12/03/2015   History of Present Illness  HPI: Jack Chung is a 75 y.o. male with medical history significant of type 2 diabetes, diabetic peripheral neuropathy, ESRD on PD, left BKA, hypertension, hypothyroidism, CHF, CAD, CABG, arthritis, sarcoidosis, hyperlipidemia, seasonal allergies who was referred from the wound clinic for evaluation of worsening chronic right foot infection. He was admitted last month for this issue as well.  Clinical Impression   Pt admitted with above diagnosis. Pt currently with functional limitations due to the deficits listed below (see PT Problem List).  Pt will benefit from skilled PT to increase their independence and safety with mobility to allow discharge to the venue listed below.       Follow Up Recommendations Home health PT;Outpatient PT (Dependes on progress post tomorrow's procedure with vascular)    Equipment Recommendations  None recommended by PT    Recommendations for Other Services OT consult     Precautions / Restrictions Precautions Precautions: Fall Precaution Comments: Reports his legs give out without warning Required Braces or Orthoses: Other Brace/Splint Other Brace/Splint: L BKA prosthesis      Mobility  Bed Mobility Overal bed mobility: Needs Assistance Bed Mobility: Supine to Sit     Supine to sit: Supervision     General bed mobility comments: Used rails; supervision mostly for line managemetn  Transfers Overall transfer level: Needs assistance Equipment used: Rolling walker (2 wheeled) Transfers: Sit to/from Stand Sit to Stand: Mod assist         General transfer comment: Heavy mod assist to power up  Ambulation/Gait Ambulation/Gait assistance: Min assist;+2 safety/equipment Ambulation Distance (Feet): 12 Feet Assistive device: Rolling walker (2 wheeled) Gait Pattern/deviations: Step-through  pattern;Decreased step length - right;Decreased step length - left;Decreased stride length     General Gait Details: Cues to self-monitor for activity tolerance; wife pushed chair behind pt as he reports his legs "give out" without warning  Stairs            Wheelchair Mobility    Modified Rankin (Stroke Patients Only)       Balance Overall balance assessment: Needs assistance         Standing balance support: Bilateral upper extremity supported Standing balance-Leahy Scale: Poor                               Pertinent Vitals/Pain Pain Assessment: No/denies pain    Home Living Family/patient expects to be discharged to:: Private residence Living Arrangements: Spouse/significant other Available Help at Discharge: Family;Available 24 hours/day Type of Home: Apartment Home Access: Ramped entrance     Home Layout: One level Home Equipment: Walker - 2 wheels;Shower seat;Electric scooter;Cane - single point;Bedside commode;Wheelchair - manual      Prior Function Level of Independence: Needs assistance   Gait / Transfers Assistance Needed: walking short distances with wife assist, RW and wc pushed behind him  ADL's / Homemaking Assistance Needed: wife assists with bathing/dressing  Comments: RW for ambulating     Hand Dominance   Dominant Hand: Right    Extremity/Trunk Assessment   Upper Extremity Assessment: Overall WFL for tasks assessed           Lower Extremity Assessment: Generalized weakness (RBKA, both generally weak)         Communication   Communication: No difficulties  Cognition Arousal/Alertness: Awake/alert Behavior During Therapy: WFL for  tasks assessed/performed Overall Cognitive Status: Within Functional Limits for tasks assessed                      General Comments      Exercises        Assessment/Plan    PT Assessment Patient needs continued PT services  PT Diagnosis Difficulty  walking;Generalized weakness   PT Problem List Decreased strength;Decreased range of motion;Decreased activity tolerance;Decreased balance;Decreased mobility;Decreased knowledge of use of DME;Decreased safety awareness;Decreased skin integrity;Decreased knowledge of precautions  PT Treatment Interventions DME instruction;Gait training;Functional mobility training;Therapeutic activities;Therapeutic exercise;Balance training;Patient/family education   PT Goals (Current goals can be found in the Care Plan section) Acute Rehab PT Goals Patient Stated Goal: did not state PT Goal Formulation: With patient Time For Goal Achievement: 12/17/15 Potential to Achieve Goals: Good    Frequency Min 3X/week   Barriers to discharge        Co-evaluation               End of Session Equipment Utilized During Treatment: Gait belt Activity Tolerance: Patient tolerated treatment well Patient left: in chair;with call bell/phone within reach;with family/visitor present Nurse Communication: Mobility status         Time: [id number] PT Time Calculation (min) (ACUTE ONLY): 28 min   Charges:   PT Evaluation $PT Eval Moderate Complexity: 1 Procedure PT Treatments $Gait Training: 8-22 mins   PT G CodesQuin Hoop 12/03/2015, 3:07 PM  Roney Marion, [place] Pager [phone] Office [phone]

## 2015-12-03 NOTE — Progress Notes (Signed)
Subjective: no cos / tolerating CAPD reports " 4 exchanges per day  and dry by 10 pm so i can sleep "/ NOTED  Dr Bridgett Larsson Plans  12/04/15 =R leg runoff, R SFA and TPT/peroneal orbital atherectomy and angioplasty Objective Vital signs in last 24 hours: Filed Vitals:   12/02/15 1802 12/02/15 1900 12/03/15 0440 12/03/15 0840  BP: 112/50 111/48 114/50 120/67  Pulse: 83 79 81 85  Temp: 98 F (36.7 C) 98.3 F (36.8 C) 97.8 F (36.6 C) 98.1 F (36.7 C)  TempSrc: Oral Oral Oral Oral  Resp: 18 17 18 18   Height:      Weight:  101.334 kg (223 lb 6.4 oz)    SpO2: 99% 100% 98% 97%   Weight change: 2.268 kg (5 lb)  Physical Exam: General:  NAD /alert elderly WM  Lungs: clear bilat Heart :RRR soft SEM no RG Abd :soft ntnd no mass or ascites +bs PD cath LLQ clear exit site Extremities: no LE edema / L BKA/ R great toe Dry  gangrenous wounds or ulcers  Dialysis Access: PD  Site ok   Dialysis: CAPD edw 96kg 4 exchanges of 3000 during the day with 4h dwell, overnight 2500 x 8 hrs  Assessment: 1 Gangrene R great toe / PVD=VVS eval in progress  2 L BKA Jan '17 3 ESRD on CAPD 4 Vol up 5 kg ( NOT STANDING  WT) euvolemic on exam  5 DM w complications 6 CAD hx CABG 7 Anemia Hb 11, then repeat 8>9 this am/ check ESA  Records  8 MBD cont binders (CaCO3) corec ca 8.3  Phos  4.0  9 Chron hypotension on midodrine  Plan - cont PD regimen do 4 exchanges and cap off at 10 pm , abx, vasc surg planning perc intervention RLE  Jack Haber, PA-C [facility] [phone] 12/03/2015,9:20 AM  LOS: 2 days   Labs: Basic Metabolic Panel:  Recent Labs Lab 12/02/15 0655 12/03/15 0540 12/03/15 0550  NA 135 132* 131*  K 2.8* 3.4* 3.4*  CL 94* 95* 95*  CO2 25 27 26   GLUCOSE 136* 240* 239*  BUN 47* 42* 42*  CREATININE 6.62* 6.41* 6.23*  CALCIUM 7.9* 7.5* 7.4*  PHOS  --   --  4.0   Liver Function Tests:  Recent Labs Lab 12/02/15 0655 12/03/15 0540 12/03/15 0550  AST 29  24  --   ALT 14* 14*  --   ALKPHOS 94 96  --   BILITOT 0.7 0.4  --   PROT 5.6* 5.3*  --   ALBUMIN 1.7* 1.6* 1.6*   No results for input(s): LIPASE, AMYLASE in the last 168 hours. No results for input(s): AMMONIA in the last 168 hours. CBC:  Recent Labs Lab 12/01/15 1237 12/02/15 0655 12/03/15 0540  WBC 12.3* 10.2 8.2  NEUTROABS 9.0* 6.5 5.4  HGB 11.0* 8.1* 9.0*  HCT 34.8* 25.9* 29.0*  MCV 95.6 93.2 93.5  PLT 414* 439* 358   Cardiac Enzymes: No results for input(s): CKTOTAL, CKMB, CKMBINDEX, TROPONINI in the last 168 hours. CBG:  Recent Labs Lab 12/02/15 0751 12/02/15 1134 12/02/15 1715 12/02/15 2014 12/03/15 0805  GLUCAP 161* 158* 184* 311* 255*    Studies/Results: No results found. Medications:   . allopurinol  100 mg Oral Daily  . aspirin  325 mg Oral Daily  . calcium carbonate  2 tablet Oral TID WC  . dialysis solution 2.5% low-MG/low-CA   Intraperitoneal Q6H  . feeding supplement (NEPRO CARB STEADY)  237 mL Oral BID BM  . fluconazole  100 mg Oral Daily  . gentamicin cream  1 application Topical QPM  . insulin aspart  0-9 Units Subcutaneous TID WC  . insulin glargine  20 Units Subcutaneous Q breakfast  . levothyroxine  112 mcg Oral QAC breakfast  . loratadine  10 mg Oral Daily  . magnesium sulfate 1 - 4 g bolus IVPB  2 g Intravenous Once  . metoCLOPramide  10 mg Oral TID AC  . midodrine  10 mg Oral Q breakfast  . mirtazapine  15 mg Oral QHS  . multivitamin  1 tablet Oral QHS  . nitroGLYCERIN  0.2 mg Transdermal Daily  . pantoprazole  40 mg Oral Daily  . piperacillin-tazobactam (ZOSYN)  IV  2.25 g Intravenous Q8H

## 2015-12-04 ENCOUNTER — Encounter (HOSPITAL_COMMUNITY)
Admission: EM | Disposition: A | Discharge status: Home / Self Care | Payer: Self-pay | Source: Home / Self Care | Attending: Internal Medicine

## 2015-12-04 ENCOUNTER — Telehealth: Payer: Self-pay | Admitting: Vascular Surgery

## 2015-12-04 ENCOUNTER — Encounter (HOSPITAL_COMMUNITY): Payer: Self-pay | Admitting: Vascular Surgery

## 2015-12-04 HISTORY — PX: PERIPHERAL VASCULAR CATHETERIZATION: SHX172C

## 2015-12-04 LAB — RENAL FUNCTION PANEL
ALBUMIN: 1.5 g/dL — AB (ref 3.5–5.0)
Anion gap: 10 (ref 5–15)
BUN: 41 mg/dL — AB (ref 6–20)
CHLORIDE: 94 mmol/L — AB (ref 101–111)
CO2: 27 mmol/L (ref 22–32)
CREATININE: 5.77 mg/dL — AB (ref 0.61–1.24)
Calcium: 7.5 mg/dL — ABNORMAL LOW (ref 8.9–10.3)
GFR, EST AFRICAN AMERICAN: 10 mL/min — AB (ref >60)
GFR, EST NON AFRICAN AMERICAN: 9 mL/min — AB (ref >60)
Glucose, Bld: 267 mg/dL — ABNORMAL HIGH (ref 65–99)
PHOSPHORUS: 3.9 mg/dL (ref 2.5–4.6)
POTASSIUM: 3.5 mmol/L (ref 3.5–5.1)
Sodium: 131 mmol/L — ABNORMAL LOW (ref 135–145)

## 2015-12-04 LAB — BASIC METABOLIC PANEL
ANION GAP: 11 (ref 5–15)
BUN: 40 mg/dL — AB (ref 6–20)
CALCIUM: 7.4 mg/dL — AB (ref 8.9–10.3)
CO2: 26 mmol/L (ref 22–32)
Chloride: 94 mmol/L — ABNORMAL LOW (ref 101–111)
Creatinine, Ser: 5.96 mg/dL — ABNORMAL HIGH (ref 0.61–1.24)
GFR calc Af Amer: 10 mL/min — ABNORMAL LOW (ref >60)
GFR calc non Af Amer: 8 mL/min — ABNORMAL LOW (ref >60)
GLUCOSE: 276 mg/dL — AB (ref 65–99)
Potassium: 3.6 mmol/L (ref 3.5–5.1)
Sodium: 131 mmol/L — ABNORMAL LOW (ref 135–145)

## 2015-12-04 LAB — CBC WITH DIFFERENTIAL/PLATELET
BASOS PCT: 0 %
Basophils Absolute: 0 10*3/uL (ref 0.0–0.1)
EOS PCT: 8 %
Eosinophils Absolute: 0.7 10*3/uL (ref 0.0–0.7)
HEMATOCRIT: 28.8 % — AB (ref 39.0–52.0)
Hemoglobin: 8.9 g/dL — ABNORMAL LOW (ref 13.0–17.0)
LYMPHS PCT: 16 %
Lymphs Abs: 1.3 10*3/uL (ref 0.7–4.0)
MCH: 29.5 pg (ref 26.0–34.0)
MCHC: 30.9 g/dL (ref 30.0–36.0)
MCV: 95.4 fL (ref 78.0–100.0)
MONO ABS: 0.9 10*3/uL (ref 0.1–1.0)
MONOS PCT: 11 %
NEUTROS ABS: 5.4 10*3/uL (ref 1.7–7.7)
Neutrophils Relative %: 65 %
PLATELETS: 404 10*3/uL — AB (ref 150–400)
RBC: 3.02 MIL/uL — ABNORMAL LOW (ref 4.22–5.81)
RDW: 14.4 % (ref 11.5–15.5)
WBC: 8.4 10*3/uL (ref 4.0–10.5)

## 2015-12-04 LAB — COMPREHENSIVE METABOLIC PANEL
ALT: 12 U/L — ABNORMAL LOW (ref 17–63)
ANION GAP: 11 (ref 5–15)
AST: 20 U/L (ref 15–41)
Albumin: 1.5 g/dL — ABNORMAL LOW (ref 3.5–5.0)
Alkaline Phosphatase: 97 U/L (ref 38–126)
BILIRUBIN TOTAL: 0.3 mg/dL (ref 0.3–1.2)
BUN: 41 mg/dL — ABNORMAL HIGH (ref 6–20)
CALCIUM: 7.5 mg/dL — AB (ref 8.9–10.3)
CO2: 27 mmol/L (ref 22–32)
Chloride: 94 mmol/L — ABNORMAL LOW (ref 101–111)
Creatinine, Ser: 5.82 mg/dL — ABNORMAL HIGH (ref 0.61–1.24)
GFR, EST AFRICAN AMERICAN: 10 mL/min — AB (ref >60)
GFR, EST NON AFRICAN AMERICAN: 9 mL/min — AB (ref >60)
GLUCOSE: 269 mg/dL — AB (ref 65–99)
POTASSIUM: 3.6 mmol/L (ref 3.5–5.1)
Sodium: 132 mmol/L — ABNORMAL LOW (ref 135–145)
Total Protein: 5.4 g/dL — ABNORMAL LOW (ref 6.5–8.1)

## 2015-12-04 LAB — GLUCOSE, CAPILLARY
GLUCOSE-CAPILLARY: 132 mg/dL — AB (ref 65–99)
GLUCOSE-CAPILLARY: 317 mg/dL — AB (ref 65–99)
Glucose-Capillary: 208 mg/dL — ABNORMAL HIGH (ref 65–99)
Glucose-Capillary: 164 mg/dL — ABNORMAL HIGH (ref 65–99)

## 2015-12-04 LAB — PROTIME-INR
INR: 1.12 (ref 0.00–1.49)
PROTHROMBIN TIME: 14.6 s (ref 11.6–15.2)

## 2015-12-04 LAB — HEMOGLOBIN A1C
Hgb A1c MFr Bld: 7 % — ABNORMAL HIGH (ref 4.8–5.6)
Mean Plasma Glucose: 154 mg/dL

## 2015-12-04 LAB — CBC
HCT: 32.4 % — ABNORMAL LOW (ref 39.0–52.0)
HEMOGLOBIN: 10 g/dL — AB (ref 13.0–17.0)
MCH: 29.7 pg (ref 26.0–34.0)
MCHC: 30.9 g/dL (ref 30.0–36.0)
MCV: 96.1 fL (ref 78.0–100.0)
PLATELETS: 393 10*3/uL (ref 150–400)
RBC: 3.37 MIL/uL — AB (ref 4.22–5.81)
RDW: 14.2 % (ref 11.5–15.5)
WBC: 10.5 10*3/uL (ref 4.0–10.5)

## 2015-12-04 LAB — POCT ACTIVATED CLOTTING TIME
ACTIVATED CLOTTING TIME: 219 s
ACTIVATED CLOTTING TIME: 230 s
ACTIVATED CLOTTING TIME: 202 s
Activated Clotting Time: 230 seconds
Activated Clotting Time: 175 seconds
Activated Clotting Time: 186 seconds

## 2015-12-04 LAB — CREATININE, SERUM
CREATININE: 6.26 mg/dL — AB (ref 0.61–1.24)
GFR, EST AFRICAN AMERICAN: 9 mL/min — AB (ref >60)
GFR, EST NON AFRICAN AMERICAN: 8 mL/min — AB (ref >60)

## 2015-12-04 LAB — VANCOMYCIN, RANDOM: VANCOMYCIN RM: 23

## 2015-12-04 SURGERY — ABDOMINAL AORTOGRAM W/LOWER EXTREMITY
Anesthesia: LOCAL | Laterality: Right

## 2015-12-04 MED ORDER — VERAPAMIL HCL 2.5 MG/ML IV SOLN
INTRAVENOUS | Status: AC
  Filled 2015-12-04: qty 2

## 2015-12-04 MED ORDER — IODIXANOL 320 MG/ML IV SOLN
INTRAVENOUS | Status: DC | PRN
Start: 2015-12-04 — End: 2015-12-04
  Administered 2015-12-04: 110 mL via INTRA_ARTERIAL

## 2015-12-04 MED ORDER — DARBEPOETIN ALFA 150 MCG/0.3ML IJ SOSY: 150.0000 ug | PREFILLED_SYRINGE | INTRAMUSCULAR | Status: DC

## 2015-12-04 MED ORDER — HEPARIN SODIUM (PORCINE) 1000 UNIT/ML IJ SOLN
INTRAMUSCULAR | Status: AC
  Filled 2015-12-04: qty 1

## 2015-12-04 MED ORDER — HEPARIN SODIUM (PORCINE) 5000 UNIT/ML IJ SOLN
5000.0000 [IU] | Freq: Three times a day (TID) | INTRAMUSCULAR | Status: DC
  Administered 2015-12-04 – 2015-12-07 (×9): 5000 [IU] via SUBCUTANEOUS
  Filled 2015-12-04 (×6): qty 1

## 2015-12-04 MED ORDER — MIDAZOLAM HCL 2 MG/2ML IJ SOLN
INTRAMUSCULAR | Status: DC | PRN
  Administered 2015-12-04: 0.5 mg via INTRAVENOUS

## 2015-12-04 MED ORDER — SODIUM CHLORIDE 0.9 % IV SOLN
250.0000 mL | INTRAVENOUS | Status: DC | PRN
  Administered 2015-12-05: 15:00:00 via INTRAVENOUS
  Administered 2015-12-05: 500 mL via INTRAVENOUS

## 2015-12-04 MED ORDER — SODIUM CHLORIDE 0.9% FLUSH
3.0000 mL | Freq: Two times a day (BID) | INTRAVENOUS | Status: DC
Start: 2015-12-04 — End: 2015-12-07
  Administered 2015-12-04 – 2015-12-06 (×5): 3 mL via INTRAVENOUS

## 2015-12-04 MED ORDER — FENTANYL CITRATE (PF) 100 MCG/2ML IJ SOLN
INTRAMUSCULAR | Status: DC | PRN
  Administered 2015-12-04: 25 ug via INTRAVENOUS

## 2015-12-04 MED ORDER — SODIUM CHLORIDE 0.9% FLUSH: 3.0000 mL | INTRAVENOUS | Status: DC | PRN

## 2015-12-04 MED ORDER — ACETAMINOPHEN 325 MG PO TABS: 650.0000 mg | ORAL_TABLET | ORAL | Status: DC | PRN

## 2015-12-04 MED ORDER — HYDRALAZINE HCL 20 MG/ML IJ SOLN: 10.0000 mg | INTRAMUSCULAR | Status: DC | PRN

## 2015-12-04 MED ORDER — POLYETHYLENE GLYCOL 3350 17 G PO PACK
17.0000 g | PACK | Freq: Every day | ORAL | Status: DC
  Filled 2015-12-04 (×2): qty 1

## 2015-12-04 MED ORDER — LIDOCAINE HCL (PF) 1 % IJ SOLN
INTRAMUSCULAR | Status: DC | PRN
  Administered 2015-12-04: 13 mL

## 2015-12-04 MED ORDER — HEPARIN (PORCINE) IN NACL 2-0.9 UNIT/ML-% IJ SOLN
INTRAMUSCULAR | Status: DC | PRN
  Administered 2015-12-04: 1000 mL via INTRA_ARTERIAL

## 2015-12-04 MED ORDER — ONDANSETRON HCL 4 MG/2ML IJ SOLN: 4.0000 mg | Freq: Four times a day (QID) | INTRAMUSCULAR | Status: DC | PRN

## 2015-12-04 MED ORDER — LIDOCAINE HCL (PF) 1 % IJ SOLN
INTRAMUSCULAR | Status: AC
  Filled 2015-12-04: qty 30

## 2015-12-04 MED ORDER — MIDAZOLAM HCL 2 MG/2ML IJ SOLN
INTRAMUSCULAR | Status: AC
  Filled 2015-12-04: qty 2

## 2015-12-04 MED ORDER — FENTANYL CITRATE (PF) 100 MCG/2ML IJ SOLN
INTRAMUSCULAR | Status: AC
  Filled 2015-12-04: qty 2

## 2015-12-04 MED ORDER — VIPERSLIDE LUBRICANT OPTIME
TOPICAL | Status: DC | PRN
  Administered 2015-12-04: 10:00:00 via SURGICAL_CAVITY

## 2015-12-04 MED ORDER — OXYCODONE-ACETAMINOPHEN 5-325 MG PO TABS: 1.0000 | ORAL_TABLET | Freq: Four times a day (QID) | ORAL | Status: DC | PRN

## 2015-12-04 MED ORDER — NITROGLYCERIN 1 MG/10 ML FOR IR/CATH LAB
INTRA_ARTERIAL | Status: DC | PRN
  Administered 2015-12-04 (×2): 200 ug via INTRA_ARTERIAL

## 2015-12-04 MED ORDER — NITROGLYCERIN IN D5W 200-5 MCG/ML-% IV SOLN
INTRAVENOUS | Status: AC
  Filled 2015-12-04: qty 250

## 2015-12-04 MED ORDER — HEPARIN (PORCINE) IN NACL 2-0.9 UNIT/ML-% IJ SOLN
INTRAMUSCULAR | Status: AC
  Filled 2015-12-04: qty 1000

## 2015-12-04 MED ORDER — HEPARIN SODIUM (PORCINE) 1000 UNIT/ML IJ SOLN
INTRAMUSCULAR | Status: DC | PRN
  Administered 2015-12-04: 2000 [IU] via INTRAVENOUS
  Administered 2015-12-04: 10000 [IU] via INTRAVENOUS

## 2015-12-04 MED ORDER — MORPHINE SULFATE (PF) 2 MG/ML IV SOLN: 2.0000 mg | INTRAVENOUS | Status: DC | PRN

## 2015-12-04 SURGICAL SUPPLY — 26 items
BALLN ARMADA 3.0X60X150 (BALLOONS) ×3
BALLN LUTONIX DCB 5X40X130 (BALLOONS) ×3
BALLN STERLING OTW 5X40X135 (BALLOONS) ×3
BALLOON ARMADA 3.0X60X150 (BALLOONS) ×2 IMPLANT
BALLOON LUTONIX DCB 5X40X130 (BALLOONS) ×2 IMPLANT
BALLOON STERLING OTW 5X40X135 (BALLOONS) ×2 IMPLANT
CATH CXI SUPP ANG 2.6FR 150CM (MICROCATHETER) ×3 IMPLANT
CATH OMNI FLUSH 5F 65CM (CATHETERS) ×6 IMPLANT
CATH SOFT-VU 4F 65 STRAIGHT (CATHETERS) ×2 IMPLANT
CATH SOFT-VU STRAIGHT 4F 65CM (CATHETERS) ×1
DIAMONDBACK CLASSIC OAS 1.5MM (CATHETERS) ×3
KIT ENCORE 26 ADVANTAGE (KITS) ×3 IMPLANT
KIT MICROINTRODUCER STIFF 5F (SHEATH) ×3 IMPLANT
KIT PV (KITS) ×3 IMPLANT
LUBRICANT VIPERSLIDE CORONARY (MISCELLANEOUS) ×3 IMPLANT
SHEATH PINNACLE 5F 10CM (SHEATH) ×3 IMPLANT
SHEATH PINNACLE ST 7F 45CM (SHEATH) ×3 IMPLANT
SYR MEDRAD MARK V 150ML (SYRINGE) ×3 IMPLANT
SYSTEM DIMNDBCK CLSC OAS 1.5MM (CATHETERS) ×2 IMPLANT
TAPE RADIOPAQUE TURBO (MISCELLANEOUS) ×3 IMPLANT
TRANSDUCER W/STOPCOCK (MISCELLANEOUS) ×3 IMPLANT
TRAY PV CATH (CUSTOM PROCEDURE TRAY) ×3 IMPLANT
WIRE BENTSON .035X145CM (WIRE) ×9 IMPLANT
WIRE HI TORQ COMMND ES.014X300 (WIRE) ×3 IMPLANT
WIRE ROSEN-J .035X260CM (WIRE) ×6 IMPLANT
WIRE VIPER ADVANCE .017X335CM (WIRE) ×6 IMPLANT

## 2015-12-04 NOTE — Progress Notes (Signed)
   Daily Progress Note  Pt underwent a successful intervention on his right peroneal artery and SFA.  At this point, the blood flow is optimized as much as possible and I would refer this patient to [name] for management of the right great toe.  Adele Barthel, MD [facility] Office: [phone] Pager: [phone]  12/04/2015, 11:22 AM

## 2015-12-04 NOTE — Progress Notes (Signed)
Pharmacy Antibiotic Note  Jack Chung is a 75 y.o. male admitted on 12/01/2015 with cellulitis.  Continues on Vancomycin and Zosyn, Blood cultures negative to date Continues on PD with random vancomycin level of 23 this AM  S/p vascular procedure today   Plan: Repeat random vancomycin level in AM, no additional dose for today Continue Zosyn 2.25 grams iv Q 8 hours  Height: 5\' 10"  (177.8 cm) Weight: 224 lb 12.8 oz (101.969 kg) IBW/kg (Calculated) : 73  Temp (24hrs), Avg:97.9 F (36.6 C), Min:97.5 F (36.4 C), Max:98.5 F (36.9 C)   Recent Labs Lab 12/01/15 1237 12/02/15 0655 12/03/15 0540 12/03/15 0550 12/04/15 0404  WBC 12.3* 10.2 8.2  --  8.4  CREATININE 6.43* 6.62* 6.41* 6.23* 5.82*  5.77*  5.96*  VANCORANDOM  --   --   --   --  23    Estimated Creatinine Clearance: 13.4 mL/min (by C-G formula based on Cr of 5.96).    Allergies  Allergen Reactions  . Flu Virus Vaccine Other (See Comments)    Gets the flu, patient has bad reaction to the flu vaccine (gets the flu)  . Lipitor [Atorvastatin] Other (See Comments)    Leg cramps and liver enzymes go "out of whack"  . Other Diarrhea and Nausea And Vomiting    NO TREE NUTS!!  . Peanut-Containing Drug Products Nausea And Vomiting    Thank you for allowing pharmacy to be a part of this patient's care. Anette Guarneri, PharmD [phone] 12/04/2015 2:23 PM

## 2015-12-04 NOTE — Telephone Encounter (Signed)
Spoke to pt's wife to sch appt 7/28 at 2:30.

## 2015-12-04 NOTE — Clinical Documentation Improvement (Signed)
Hospitalist  Can the diagnosis of CHF be further specified?    Acuity - Acute, Chronic, Acute on Chronic   Type - Systolic, Diastolic, Systolic and Diastolic  Other  Clinically Undetermined   Document any associated diagnoses/conditions Please update your documentation within the medical record to reflect your response to this query. Thank you.  Supporting Information: ( As per notes) CHF (congestive heart failure) ([place])    Please exercise your independent, professional judgment when responding. A specific answer is not anticipated or expected.  Thank You, Alessandra Grout, RN, BSN, CCDS,Clinical Documentation Specialist:  [phone]  [phone]=[facility] [place]- Health Information Management

## 2015-12-04 NOTE — Progress Notes (Signed)
OPERATIVE NOTE   PROCEDURE: 1.  Left common femoral artery cannulation under ultrasound guidance 2.  Second order arterial selection 3.  Right leg runoff 5.  Orbital atherectomy right peroneal artery 6.  Angioplasty of right peroneal artery (3 mm x 60 mm) 7.  Bland angioplasty of right superficial femoral artery (5 mm x 40 mm) 8.  Drug coated angioplasty of right superficial femoral artery (5 mm x 40 mm) 9.  Conscious sedation for 98 minutes 10.  Intra-arterial administration of nitroglycerin  PRE-OPERATIVE DIAGNOSIS: right great toe gangrene  POST-OPERATIVE DIAGNOSIS: same as above   SURGEON: Adele Barthel, MD  ANESTHESIA: conscious sedation  ESTIMATED BLOOD LOSS: 50 cc  CONTRAST: 110 cc  FINDING(S):  Widely patent right common femoral artery and profunda femoral artery   Patent and calcified right superficial femoral artery with 50-75% stenosis: resolved after angioplasty  Patent popliteal artery which tapers in size  Patent tibioperoneal trunk   Occluded anterior tibial artery   Patent proximal posterior tibial artery with occludes shortly after take off  Patent peroneal artery with >75% stenosis at take off (<30% residual stenosis) and proximal 1/3 stenosis >90% (resolved), distal peroneal artery diseased by patent with <2 mm lumen  Runoff to foot via peroneal artery  SPECIMEN(S):  none  INDICATIONS:   Jack Chung is a 75 y.o. male who presents with right great toe gangrene.  On his prior angiogram, he had brisk flow to the right foot, so I felt wound care would be the best first intervention.  Since then his wound has progressed.  The patient presents for: right leg and possible intervention.  I discussed with the patient the nature of angiographic procedures, especially the limited patencies of any endovascular intervention.  The patient is aware of that the risks of an angiographic procedure include but are not limited to: bleeding, infection, access site  complications, renal failure, embolization, rupture of vessel, dissection, possible need for emergent surgical intervention, possible need for surgical procedures to treat the patient's pathology, and stroke and death.  The patient is aware of the risks and agrees to proceed.  DESCRIPTION: After full informed consent was obtained from the patient, the patient was brought back to the angiography suite.  The patient was placed supine upon the angiography table and connected to cardiopulmonary monitoring equipment.  The patient was then given conscious sedation, the amounts of which are documented in the patient's chart.  A circulating radiologic technician maintained continuous monitoring of the patient's cardiopulmonary status.  Additionally, the control room radiologic technician provided backup monitoring throughout the procedure.  The patient was prepped and drape in the standard fashion for an angiographic procedure.  At this point, attention was turned to the left groin.  Under ultrasound guidance, the subcutaneous tissue surrounding the left common femoral artery was anesthesized with 1% lidocaine with epinephrine.  The artery was then cannulated with a micropuncture needle.  The microwire was advanced into the iliac arterial system.  The needle was exchanged for a microsheath, which was loaded into the common femoral artery over the wire.  The microwire was exchanged for a Bentson wire which was advanced into the aorta.  The microsheath was then exchanged for a 5-Fr sheath which was loaded into the common femoral artery.  The Bentson wire was replaced in the catheter, and using the Bentson and Omniflush catheter, the right common iliac artery was selected.  The wire was advanced into the superficial femoral artery.  The catheter would not cross  the bifurcation, so I exchanged it for an end-hole catheter, which landed in the right superficial femoral artery.  The wire was exchanged for a Rosen wire.  The  patient's left femoral sheath was exchanged for a 7-Fr Destination sheath, which was lodged in the right superficial femoral artery.  The dilator was removed.  The patient was given 10000 units of Heparin intravenously, which was a therapeutic bolus.  In total, 12000 units of Heparin was administrated to achieve and maintain a therapeutic level of anticoagulation.  Using a CX catheter and Command wire, I was able to navigate down to the distal peroneal artery.  The [facility] orbital atherectomy device was loaded over the wire used to treat the peroneal lesions at 60K, 90K, and 140K x 2.  200 mcg of nitroglycerin was given after the first two revolution and also the final revolutions.  I exchanged the device for a 3 mm x 60 mm angioplasty balloon which was centered on the two lesions.  This balloon was inflated to 8 atm for 2 minutes.  I then deflated it to 4 atm for 2 minutes.  The balloon was removed and a completion injection completed, which demonstrated <30% residual stenosis in the most proximal lesion and resolution of the >90% lesion.    At this point, I turned my attention to the 50-75% SFA stenosis at Hunter's canal.  I placed a 5 mm x 40 mm bland balloon centered on this stenosis.  It was inflated to 6 atm for 2 minutes.  I then deflated this and exchanged it for a Lutonix drug coat balloon  (5 mm x 40 mm) which was inflated to 6 atm for 2 minutes.  I deflated this balloon and removed it.  The completion imaging demonstrated resolution of the superficial femoral artery lesion.  At this point, I removed the wire and pulled the sheath back into the left external iliac artery.  The sheath was aspirated.  No clots were present and the sheath was reloaded with heparinized saline.     COMPLICATIONS: none  CONDITION: stable   Adele Barthel, MD [facility] Office: [phone] Pager: [phone]  12/04/2015, 11:05 AM

## 2015-12-04 NOTE — Progress Notes (Signed)
Site area: Insurance underwriter Prior to Removal:  Level 0 Pressure Applied For: 25 min Manual:   yes Patient Status During Pull:  A/O Post Pull Site:  Level 0 Post Pull Instructions Given:  Post instructions given. Pt understands Post Pull Pulses Present: Doppler rt pt Dressing Applied:  tegaderm and a 4x4 Bedrest begins @ 13:55:00 Comments: Pt leaves cath lab holding area in stable condition. Lt groin unremarkable. Dressing is CDI.

## 2015-12-04 NOTE — Telephone Encounter (Signed)
-----   Message from Mena Goes, RN sent at 12/04/2015 12:44 PM EDT ----- Regarding: 4 week post stenting    ----- Message -----    From: Conrad Randalia, MD    Sent: 12/04/2015  11:19 AM      To: [id number] [id number] Aug 25, 1940  PROCEDURE: 1.  Left common femoral artery cannulation under ultrasound guidance 2.  Second order arterial selection 3.  Right leg runoff 5.  Orbital atherectomy right peroneal artery 6.  Angioplasty of right peroneal artery (3 mm x 60 mm) 7.  Bland angioplasty of right superficial femoral artery (5 mm x 40 mm) 8.  Drug coated angioplasty of right superficial femoral artery (5 mm x 40 mm) 9.  Conscious sedation for 98 minutes 10.  Intra-arterial administration of nitroglycerin   Follow-up: 4 weeks

## 2015-12-04 NOTE — Progress Notes (Signed)
Inpatient Diabetes Program Recommendations  AACE/ADA: New Consensus Statement on Inpatient Glycemic Control (2015)  Target Ranges:  Prepandial:   less than 140 mg/dL      Peak postprandial:   less than 180 mg/dL (1-2 hours)      Critically ill patients:  140 - 180 mg/dL   Lab Results  Component Value Date   GLUCAP 164* 12/04/2015   HGBA1C 7.0* 12/03/2015    Review of Glycemic Control  Inpatient Diabetes Program Recommendations:  Insulin - Basal: Increase Lantus to 25 units Correction (SSI): Increase correction to moderate scale and add HS scale per Glycemic Control order set Thank you  Raoul Pitch MSN, RN,CDE Inpatient Diabetes Coordinator [phone] (team pager)

## 2015-12-04 NOTE — Progress Notes (Signed)
[facility] Progress Note  Patient: Jack Chung   PCP: [name], MD DOB: 05-Feb-1941   DOA: 12/01/2015   DOS: 12/04/2015   Date of Service: the patient was seen and examined on 12/04/2015  Subjective: Patient seen after angiography, tolerated procedure well. Denies any acute complaint. No nausea no vomiting. Nutrition: tolerating oral diet  Brief hospital course: Pt. with PMH of IDDM, diabetic neuropathy, ESRD on PD, HTN, hypothyroidism, CHF, CAD, CABG, hyperlipidemia, sarcoidosis, Left BKA, arthritis ; admitted on 12/01/2015, with complaint of worsening wound to right great toe, was found to have cellulitis of the right lower leg. Managed at wound care and referred to the hospital. Treated with Ciprofloxacin. 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 Started on Zosyn and Vancomycin. Vascular surgery consulted, ordered CTA of right leg. Status post angioplasty of the right leg. Orthopedic [name] consulted and the patient is scheduled for first ray amputation on 12/05/2015. Currently further plan is continue ABX and monitoring symptoms.  Assessment and Plan:  Cellulitis of right lower extremity Continue Zosyn. Status post PTA of right leg with right peroneal artery angioplasty as well as right superficial femoral artery angioplasty. Drug-coated angioplasty of right superficial femoral artery. ESR 127, CRP 11.8 Blood cultures no growth to date. Discussed with patient who initially refused but later on agreed to discuss with [name]. After discussion with [name] the patient is agreeable for first ray amputation scheduled on 12/05/2015.  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 Asymptomatic and no signs of acute blood loss Monitor hematocrit and hemoglobin.  ESRD on peritoneal dialysis [facility]) Nephrology was consulted for PD orders.  Hypokalemia: Replacing, recheck in the morning.  Hypoproteinemia Will continue to monitor  CMP in the  morning  Gout Continue allopurinol.  CAD (coronary artery disease) Stable at this time. Continue aspirin.  Type 2 diabetes mellitus with polyneuropathy (HCC) Carbohydratemodified/renal diet. Continue Lantus 20 units at breakfast and regular insulin sliding scale. Continue CBG monitoring  Hemoglobin A1c 7.0.  Hypothyroidism Continue levothyroxine.  Pain management: Tylenol Activity: physical therapy recommends home with home health Bowel regimen: last BM 6/27 MiraLAX added Diet: Renal and carb modified  DVT Prophylaxis: subcutaneous Heparin and mechanical compression device.  Advance goals of care discussion: Full code  Family Communication: family at bedside  Disposition:  Discharge to home. Expected discharge date: 12/07/2015, pending postoperative recovery as well as PTOT consult.  Consultants: Vascular surgery, nephrology, orthopedic surgery Procedures: Angioplasty of the right superficial femoral as well as right peroneal artery.  Antibiotics: Anti-infectives    Start     Dose/Rate Route Frequency Ordered Stop   12/02/15 1400  piperacillin-tazobactam (ZOSYN) IVPB 2.25 g     2.25 g 100 mL/hr over 30 Minutes Intravenous Every 8 hours 12/02/15 1038     12/02/15 1000  fluconazole (DIFLUCAN) tablet 100 mg     100 mg Oral Daily 12/01/15 2106 12/03/15 1215   12/02/15 0700  piperacillin-tazobactam (ZOSYN) IVPB 2.25 g  Status:  Discontinued     2.25 g 100 mL/hr over 30 Minutes Intravenous Every 12 hours 12/01/15 2141 12/02/15 1038   12/01/15 2300  vancomycin (VANCOCIN) 2,000 mg in sodium chloride 0.9 % 500 mL IVPB     2,000 mg 250 mL/hr over 120 Minutes Intravenous  Once 12/01/15 2141 12/02/15 0012   12/01/15 1900  piperacillin-tazobactam (ZOSYN) IVPB 3.375 g     3.375 g 100 mL/hr over 30 Minutes Intravenous  Once 12/01/15 1857 12/01/15 1953  Intake/Output Summary (Last 24 hours) at 12/04/15 1818 Last data filed at 12/04/15 1501  Gross per 24 hour  Intake    8890 ml  Output   5500 ml  Net   3390 ml   Filed Weights   12/01/15 2054 12/02/15 1900 12/03/15 2143  Weight: 99.066 kg (218 lb 6.4 oz) 101.334 kg (223 lb 6.4 oz) 101.969 kg (224 lb 12.8 oz)    Objective: Physical Exam: Filed Vitals:   12/04/15 1350 12/04/15 1355 12/04/15 1400 12/04/15 1701  BP: 121/48 123/48 138/55 142/53  Pulse: 75 76 75 76  Temp:    97.8 F (36.6 C)  TempSrc:    Oral  Resp:    18  Height:      Weight:      SpO2: 100% 100% 100% 100%    General: Alert, Awake and Oriented to Time, Place and Person. Appear in no distress Eyes: PERRL, Conjunctiva normal ENT: Oral Mucosa clear moist. Cardiovascular: S1 and S2 Present, no Murmur, Respiratory: Bilateral Air entry equal and Decreased, Clear to Auscultation Abdomen: Bowel Sound present, Soft and no tenderness Skin: Black patches to right great toe with surrounding redness, no Rash  Extremities: +1 Pedal edema, no calf tenderness Neurologic: Grossly no focal neuro deficit. Bilaterally Equal motor strength  Data Reviewed: CBC:  Recent Labs Lab 12/01/15 1237 12/02/15 0655 12/03/15 0540 12/04/15 0404 12/04/15 1514  WBC 12.3* 10.2 8.2 8.4 10.5  NEUTROABS 9.0* 6.5 5.4 5.4  --   HGB 11.0* 8.1* 9.0* 8.9* 10.0*  HCT 34.8* 25.9* 29.0* 28.8* 32.4*  MCV 95.6 93.2 93.5 95.4 96.1  PLT 414* 439* 358 404* 638   Basic Metabolic Panel:  Recent Labs Lab 12/01/15 1237 12/02/15 0655 12/03/15 0540 12/03/15 0550 12/04/15 0404 12/04/15 1514  NA 133* 135 132* 131* 132*  131*  131*  --   K 3.3* 2.8* 3.4* 3.4* 3.6  3.5  3.6  --   CL 92* 94* 95* 95* 94*  94*  94*  --   CO2 '27 25 27 26 27  27  26  ' --   GLUCOSE 52* 136* 240* 239* 269*  267*  276*  --   BUN 47* 47* 42* 42* 41*  41*  40*  --   CREATININE 6.43* 6.62* 6.41* 6.23* 5.82*  5.77*  5.96* 6.26*  CALCIUM 8.3* 7.9* 7.5* 7.4* 7.5*  7.5*  7.4*  --   MG  --   --  1.4*  --   --   --   PHOS  --   --   --  4.0 3.9  --     Liver Function  Tests:  Recent Labs Lab 12/02/15 0655 12/03/15 0540 12/03/15 0550 12/04/15 0404  AST 29 24  --  20  ALT 14* 14*  --  12*  ALKPHOS 94 96  --  97  BILITOT 0.7 0.4  --  0.3  PROT 5.6* 5.3*  --  5.4*  ALBUMIN 1.7* 1.6* 1.6* 1.5*  1.5*   No results for input(s): LIPASE, AMYLASE in the last 168 hours. No results for input(s): AMMONIA in the last 168 hours. Coagulation Profile:  Recent Labs Lab 12/04/15 0404  INR 1.12   Cardiac Enzymes: No results for input(s): CKTOTAL, CKMB, CKMBINDEX, TROPONINI in the last 168 hours. BNP (last 3 results) No results for input(s): PROBNP in the last 8760 hours.  CBG:  Recent Labs Lab 12/03/15 1720 12/03/15 2139 12/04/15 0803 12/04/15 1142 12/04/15 1700  GLUCAP 308* 346* 208*  164* 132*    Studies: No results found.   Scheduled Meds: . allopurinol  100 mg Oral Daily  . aspirin  325 mg Oral Daily  . calcium carbonate  2 tablet Oral TID WC  . [START ON 12/11/2015] darbepoetin (ARANESP) injection - DIALYSIS  150 mcg Intravenous Q Thu-HD  . dialysis solution 2.5% low-MG/low-CA   Intraperitoneal Q6H  . feeding supplement (NEPRO CARB STEADY)  237 mL Oral BID BM  . gentamicin cream  1 application Topical QPM  . heparin  5,000 Units Subcutaneous Q8H  . insulin aspart  0-9 Units Subcutaneous TID WC  . insulin glargine  20 Units Subcutaneous Q breakfast  . levothyroxine  112 mcg Oral QAC breakfast  . loratadine  10 mg Oral Daily  . metoCLOPramide  10 mg Oral TID AC  . midodrine  10 mg Oral Q breakfast  . mirtazapine  15 mg Oral QHS  . multivitamin  1 tablet Oral QHS  . nitroGLYCERIN  0.2 mg Transdermal Daily  . pantoprazole  40 mg Oral Daily  . piperacillin-tazobactam (ZOSYN)  IV  2.25 g Intravenous Q8H  . sodium chloride flush  3 mL Intravenous Q12H   Continuous Infusions:  PRN Meds: sodium chloride, acetaminophen, heparin, heparin, hydrALAZINE, lactulose, loperamide, morphine injection, ondansetron **OR** ondansetron (ZOFRAN) IV,  oxyCODONE-acetaminophen, sodium chloride flush  Time spent: 30 minutes  Author: Berle Mull, MD [facility] 12/04/2015 6:18 PM

## 2015-12-04 NOTE — Consult Note (Signed)
ORTHOPAEDIC CONSULTATION  REQUESTING PHYSICIAN: Lavina Hamman, MD  Chief Complaint: Dry gangrene right great toe  HPI: Jack Chung is a 75 y.o. male who presents with dry gangrene right great toe. Patient is status post revascularization to the right lower extremity with Dr. Bridgett Larsson. He has diabetic insensate neuropathy status post transtibial amputation on the left who presents status post revascularization for foot salvage intervention.  Past Medical History  Diagnosis Date  . Hypertension   . Hypothyroidism   . CHF (congestive heart failure) ([place])   . Anemia   . Blind left eye   . Sleep apnea     not on CPAP  . Coronary artery disease     CABG x 11 Jun 2009.  MRSA infections of incsions  . Arthritis   . Sarcoidosis ([place])     Hx:of  . Diabetic retinopathy ([place])     Hx: of bilateral  . Edema   . Hyperlipidemia   . 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 Atherosclerosis of native arteries of the extremities, unspecified   . Eczema     Hx: of  . Insomnia   . Seasonal allergies   . History of blood transfusion     with heart surgery  . Coronary atherosclerosis of native coronary artery   . Atherosclerosis of native arteries of the extremities, unspecified   . Pneumonia   . Diabetes mellitus     type 2  . ESRD on dialysis [facility])     peritoneal dialysis - 5 times a time 9a, 12n, 3p, 6p, 9p  . GERD (gastroesophageal reflux disease)   . Neuropathy ([place])   . Cancer ([place])     skin cancer - melanoma on back  . Gangrene [facility])    Past Surgical History  Procedure Laterality Date  . Cardiac surgery      total of 6 surgeries, 5 related to mrsa  . Coronary artery bypass graft  06/2009  . Cataract surgery      Hx: of  . Debridements      Hx: of secondary to MRSA  . Av fistula placement Right 10/11/2012    Procedure: ARTERIOVENOUS (AV) FISTULA CREATION;  Surgeon: Rosetta Posner, MD;  Location: [place];  Service: Vascular;  Laterality: Right;  .  Insertion of dialysis catheter Left   . Capd insertion N/A 11/28/2012    Procedure: LAPAROSCOPIC PERITONEAL DIALYSIS CATHETER PLACEMENT;  Surgeon: Ralene Ok, MD;  Location: [place];  Service: General;  Laterality: N/A;  . Left heart catheterization with coronary/graft angiogram N/A 01/31/2014    Procedure: LEFT HEART CATHETERIZATION WITH Beatrix Fetters;  Surgeon: Troy Sine, MD;  Location: [facility] CATH LAB;  Service: Cardiovascular;  Laterality: N/A;  . Peripheral vascular catheterization N/A 04/28/2015    Procedure: Abdominal Aortogram w/Lower Extremity;  Surgeon: Conrad Welton, MD;  Location: [place] CV LAB;  Service: Cardiovascular;  Laterality: N/A;  . Amputation Left 04/30/2015    Procedure: 1st Ray Amputation Left Foot;  Surgeon: Newt Minion, MD;  Location: [place];  Service: Orthopedics;  Laterality: Left;  . Esophagogastroduodenoscopy N/A 05/20/2015    Procedure: ESOPHAGOGASTRODUODENOSCOPY (EGD);  Surgeon: Wilford Corner, MD;  Location: [facility] ENDOSCOPY;  Service: Endoscopy;  Laterality: N/A;  Possible esophageal dilatation  . Eye surgery Bilateral     cataract surgery with lens implants  . Amputation Left 06/13/2015    Procedure: Left Below Knee Amputation;  Surgeon: Newt Minion, MD;  Location:  [place] OR;  Service: Orthopedics;  Laterality: Left;  . Peripheral vascular catheterization N/A 10/30/2015    Procedure: Abdominal Aortogram;  Surgeon: Conrad Rockville, MD;  Location: [place] CV LAB;  Service: Cardiovascular;  Laterality: N/A;  . Lower extremity angiogram Right 10/30/2015    Procedure: Lower Extremity Angiogram;  Surgeon: Conrad Irondale, MD;  Location: [place] CV LAB;  Service: Cardiovascular;  Laterality: Right;  . Peripheral vascular catheterization N/A 12/04/2015    Procedure: Abdominal Aortogram w/Lower Extremity;  Surgeon: Conrad Bear Rocks, MD;  Location: [place] CV LAB;  Service: Cardiovascular;  Laterality: N/A;  . Peripheral vascular catheterization Right  12/04/2015    Procedure: Peripheral Vascular Atherectomy;  Surgeon: Conrad Venango, MD;  Location: [place] CV LAB;  Service: Cardiovascular;  Laterality: Right;  Peroneal, Superficial femoral   Social History   Social History  . Marital Status: Married    Spouse Name: N/A  . Number of Children: N/A  . Years of Education: N/A   Social History Main Topics  . Smoking status: Never Smoker   . Smokeless tobacco: Never Used  . Alcohol Use: No  . Drug Use: No  . Sexual Activity: Not Asked   Other Topics Concern  . None   Social History Narrative   From [place] here a yr ago   Has 93 kids-eldest son in town   Family History  Problem Relation Age of Onset  . COPD Mother   . COPD Father    - negative except otherwise stated in the family history section Allergies  Allergen Reactions  . Flu Virus Vaccine Other (See Comments)    Gets the flu, patient has bad reaction to the flu vaccine (gets the flu)  . Lipitor [Atorvastatin] Other (See Comments)    Leg cramps and liver enzymes go "out of whack"  . Other Diarrhea and Nausea And Vomiting    NO TREE NUTS!!  . Peanut-Containing Drug Products Nausea And Vomiting   Prior to Admission medications   Medication Sig Start Date End Date Taking? Authorizing Provider  allopurinol (ZYLOPRIM) 100 MG tablet Take 100 mg by mouth daily.   Yes Historical Provider, MD  aspirin 325 MG EC tablet Take 325 mg by mouth daily.   Yes Historical Provider, MD  calcium carbonate (TUMS - DOSED IN MG ELEMENTAL CALCIUM) 500 MG chewable tablet Chew 2 tablets by mouth 3 (three) times daily with meals.    Yes Historical Provider, MD  cetirizine (ZYRTEC) 10 MG tablet Take 10 mg by mouth daily as needed for allergies.    Yes Historical Provider, MD  ciprofloxacin (CIPRO) 500 MG tablet Take 500 mg by mouth 2 (two) times daily. 11/28/15  Yes Historical Provider, MD  fluconazole (DIFLUCAN) 100 MG tablet Take 100 mg by mouth daily. Start date 11/24/15 to  last 7 days 11/24/15  Yes Historical Provider, MD  gentamicin cream (GARAMYCIN) 0.1 % Apply 1 application topically daily. Applies to stomach.   Yes Historical Provider, MD  insulin aspart (NOVOLOG) 100 UNIT/ML injection Inject 10 Units into the skin 3 (three) times daily with meals. To give 10 units PLUS SSI -Take none if blood sugar < 150, 151-200 =  2 units, 201-250 = 4 units, 251-300 = 6 units, 301-350 = 8 units   Yes Historical Provider, MD  Insulin Glargine (LANTUS SOLOSTAR) 100 UNIT/ML Solostar Pen Inject 20 Units into the skin daily with breakfast.   Yes Historical Provider, MD  Lactobacillus (ACIDOPHILUS  PROBIOTIC PO) Take 1 tablet by mouth daily.    Yes Historical Provider, MD  lactulose (CHRONULAC) 10 GM/15ML solution Take 10 g by mouth every 4 (four) hours as needed for mild constipation.   Yes Historical Provider, MD  levothyroxine (SYNTHROID, LEVOTHROID) 112 MCG tablet LABS/APPOINTMENT OVERDUE, 1 by mouth daily in the morning 30 minutes before breakfast for Thyroid Patient taking differently: Take 112 mcg by mouth daily before breakfast.  12/10/13  Yes Tiffany L Reed, DO  loperamide (IMODIUM) 2 MG capsule Take 2 mg by mouth daily as needed for diarrhea or loose stools.   Yes Historical Provider, MD  metoCLOPramide (REGLAN) 5 MG tablet Take 2 tablets (10 mg total) by mouth 3 (three) times daily before meals. 05/02/15  Yes Costin Karlyne Greenspan, MD  midodrine (PROAMATINE) 10 MG tablet Take 1 tablet (10 mg total) by mouth 3 (three) times daily. Patient taking differently: Take 10 mg by mouth every morning.  03/11/15  Yes Pixie Casino, MD  mirtazapine (REMERON) 15 MG tablet Take 15 mg by mouth at bedtime.  09/17/15  Yes Historical Provider, MD  multivitamin (RENA-VIT) TABS tablet Take 1 tablet by mouth daily.   Yes Historical Provider, MD  Nutritional Supplements (FEEDING SUPPLEMENT, NEPRO CARB STEADY,) LIQD Drink 1 can by mouth once daily   Yes Historical Provider, MD  omeprazole (PRILOSEC) 10 MG  capsule Take 10 mg by mouth daily.    Yes Historical Provider, MD  OVER THE COUNTER MEDICATION LiquiCell: Mix in orange juice and drink daily (afternoon or evening)   Yes Historical Provider, MD  potassium chloride SA (K-DUR,KLOR-CON) 20 MEQ tablet Take 20 mEq by mouth daily.   Yes Historical Provider, MD  predniSONE (DELTASONE) 20 MG tablet Take 20 mg by mouth daily as needed (for joint pain from R.A. and O.A.).   Yes Historical Provider, MD  UNABLE TO FIND Peritoneal dialysis: Every 4 waking hours (0800, 1200, 1600, 2000)   Yes Historical Provider, MD  nitroGLYCERIN (NITRODUR - DOSED IN MG/24 HR) 0.2 mg/hr patch Place patch on the top of right foot every 24 hours, remove the previous patch. Patient taking differently: Place 0.2 mg onto the skin daily. Place patch on the top of right foot every 24 hours, remove the previous patch. 10/17/15   Sharmon Leyden Nickel, NP   No results found. - pertinent xrays, CT, MRI studies were reviewed and independently interpreted  Positive ROS: All other systems have been reviewed and were otherwise negative with the exception of those mentioned in the HPI and as above.  Physical Exam: General: Alert, no acute distress Cardiovascular: No pedal edema Respiratory: No cyanosis, no use of accessory musculature GI: No organomegaly, abdomen is soft and non-tender Skin: Dry gangrene right great toe Neurologic: Patient does not have protective sensation. Psychiatric: Patient is competent for consent with normal mood and affect Lymphatic: No axillary or cervical lymphadenopathy  MUSCULOSKELETAL:  Patient does not have a palpable dorsalis pedis pulse but patient does have a good warm foot. By report excellent revascularization with stenting on the right side. Patient has dry gangrene with cellulitis up to the MTP joint.  Assessment: Assessment: Diabetic insensate neuropathy peripheral vascular disease status post 3 vessel rosacea and the right lower extremity with dry  gangrene of the right great toe.  Plan: Plan: We'll plan for a first ray amputation the right. Discussed with the patient risks and benefits of surgery including infection neurovascular injury nonhealing the wound need for higher level amputation. Patient states he  understands wishes to proceed at this time. Patient states he would like to be discharged to home postoperatively discussed that he will need to be ambulating in a wheelchair with nonweightbearing on the right with transfers with weightbearing on the right only.  Thank you for the consult and the opportunity to see Jack Chung, [place] [phone] 5:34 PM

## 2015-12-04 NOTE — Progress Notes (Signed)
Subjective:  Just back from Dr. Lianne Moris successful intervention on his right peroneal artery and SFA./  Noted [name] to see for Dry Gangrenous Toe   Tolerating CAPD   Objective Vital signs in last 24 hours: Filed Vitals:   12/04/15 1345 12/04/15 1350 12/04/15 1355 12/04/15 1400  BP: 132/46 121/48 123/48 138/55  Pulse: 75 75 76 75  Temp:      TempSrc:      Resp:      Height:      Weight:      SpO2: 100% 100% 100% 100%   Weight change: 0.635 kg (1 lb 6.4 oz)   Physical Exam: General: NAD /alert elderly WM  Lungs: clear bilat Heart :RRR soft SEM no RG Abd :soft ntnd no mass or ascites +bs PD cath LLQ clear exit site Extremities: no LE edema / L BKA/ R great toe Dry gangrenous wounds or ulcers Dialysis Access: PD Site ok   Dialysis: CAPD edw 96kg 4 exchanges of 3000 during the day with 4h dwell, overnight 2500 x 8 hrs  Assessment: 1 Gangrene R great toe / PVD/ Sp  Dr. Bridgett Larsson successful intervention on his right peroneal artery and SFA./ [name] further RX  2 L BKA Jan '17 3 ESRD on CAPD 4 Vol up 5 kg ( NOT STANDING WT) euvolemic on exam  no wt today  5 DM w complications 6 CAD hx CABG 7 Anemia Hb 11, then repeat 8>9>8.9  this am/ Op Mircera  150 mcg q 2 weeks last on 11/17/15 / will give Aranesp 150mg   Today and q Thursday while in [place]. 8 MBD cont binders (CaCO3) corec ca 8.3 Phos3.9   9 Chron hypotension on midodrine  Plan - cont PD regimen doing  4 exchanges and cap off at 10 pm , abx, vasc surg interven. Dr. Arnetha Courser, PA-C [place] [phone] 12/04/2015,2:31 PM  LOS: 3 days   Pt seen, examined and agree w A/P as above.  [name] MD [facility] pager [phone]    cell [phone] 12/05/2015, 12:14 PM    Labs: Basic Metabolic Panel:  Recent Labs Lab 12/03/15 0540 12/03/15 0550 12/04/15 0404  NA 132* 131* 132*  131*  131*  K 3.4* 3.4* 3.6  3.5  3.6  CL 95* 95* 94*  94*  94*   CO2 27 26 27  27  26   GLUCOSE 240* 239* 269*  267*  276*  BUN 42* 42* 41*  41*  40*  CREATININE 6.41* 6.23* 5.82*  5.77*  5.96*  CALCIUM 7.5* 7.4* 7.5*  7.5*  7.4*  PHOS  --  4.0 3.9   Liver Function Tests:  Recent Labs Lab 12/02/15 0655 12/03/15 0540 12/03/15 0550 12/04/15 0404  AST 29 24  --  20  ALT 14* 14*  --  12*  ALKPHOS 94 96  --  97  BILITOT 0.7 0.4  --  0.3  PROT 5.6* 5.3*  --  5.4*  ALBUMIN 1.7* 1.6* 1.6* 1.5*  1.5*   No results for input(s): LIPASE, AMYLASE in the last 168 hours. No results for input(s): AMMONIA in the last 168 hours. CBC:  Recent Labs Lab 12/01/15 1237 12/02/15 0655 12/03/15 0540 12/04/15 0404  WBC 12.3* 10.2 8.2 8.4  NEUTROABS 9.0* 6.5 5.4 5.4  HGB 11.0* 8.1* 9.0* 8.9*  HCT 34.8* 25.9* 29.0* 28.8*  MCV 95.6 93.2 93.5 95.4  PLT 414* 439* 358 404*   Cardiac Enzymes:  No results for input(s): CKTOTAL, CKMB, CKMBINDEX, TROPONINI in the last 168 hours. CBG:  Recent Labs Lab 12/03/15 1240 12/03/15 1720 12/03/15 2139 12/04/15 0803 12/04/15 1142  GLUCAP 167* 308* 346* 208* 164*    Studies/Results: No results found. Medications:   . allopurinol  100 mg Oral Daily  . aspirin  325 mg Oral Daily  . calcium carbonate  2 tablet Oral TID WC  . dialysis solution 2.5% low-MG/low-CA   Intraperitoneal Q6H  . feeding supplement (NEPRO CARB STEADY)  237 mL Oral BID BM  . gentamicin cream  1 application Topical QPM  . heparin  5,000 Units Subcutaneous Q8H  . insulin aspart  0-9 Units Subcutaneous TID WC  . insulin glargine  20 Units Subcutaneous Q breakfast  . levothyroxine  112 mcg Oral QAC breakfast  . loratadine  10 mg Oral Daily  . metoCLOPramide  10 mg Oral TID AC  . midodrine  10 mg Oral Q breakfast  . mirtazapine  15 mg Oral QHS  . multivitamin  1 tablet Oral QHS  . nitroGLYCERIN  0.2 mg Transdermal Daily  . pantoprazole  40 mg Oral Daily  . piperacillin-tazobactam (ZOSYN)  IV  2.25 g Intravenous Q8H  . sodium  chloride flush  3 mL Intravenous Q12H

## 2015-12-04 NOTE — Care Management Important Message (Signed)
Important Message  Patient Details  Name: Jack Chung MRN: [id number] Date of Birth: [birth year]   Medicare Important Message Given:  Yes    Loann Quill 12/04/2015, 10:00 AM

## 2015-12-05 ENCOUNTER — Encounter (HOSPITAL_COMMUNITY): Payer: Self-pay | Admitting: Certified Registered Nurse Anesthetist

## 2015-12-05 ENCOUNTER — Encounter (HOSPITAL_COMMUNITY)
Admission: EM | Disposition: A | Discharge status: Home / Self Care | Payer: Self-pay | Source: Home / Self Care | Attending: Internal Medicine

## 2015-12-05 ENCOUNTER — Ambulatory Visit: Payer: Medicare Other | Admitting: Family

## 2015-12-05 ENCOUNTER — Inpatient Hospital Stay (HOSPITAL_COMMUNITY): Payer: Medicare Other | Admitting: Certified Registered Nurse Anesthetist

## 2015-12-05 ENCOUNTER — Ambulatory Visit: Payer: Medicare Other | Admitting: Vascular Surgery

## 2015-12-05 HISTORY — PX: AMPUTATION: SHX166

## 2015-12-05 LAB — RENAL FUNCTION PANEL
Albumin: 1.5 g/dL — ABNORMAL LOW (ref 3.5–5.0)
Anion gap: 12 (ref 5–15)
BUN: 45 mg/dL — ABNORMAL HIGH (ref 6–20)
CO2: 25 mmol/L (ref 22–32)
Calcium: 7.7 mg/dL — ABNORMAL LOW (ref 8.9–10.3)
Chloride: 94 mmol/L — ABNORMAL LOW (ref 101–111)
Creatinine, Ser: 6.61 mg/dL — ABNORMAL HIGH (ref 0.61–1.24)
GFR calc Af Amer: 9 mL/min — ABNORMAL LOW (ref >60)
GFR calc non Af Amer: 7 mL/min — ABNORMAL LOW (ref >60)
Glucose, Bld: 172 mg/dL — ABNORMAL HIGH (ref 65–99)
Phosphorus: 4.5 mg/dL (ref 2.5–4.6)
Potassium: 3.6 mmol/L (ref 3.5–5.1)
Sodium: 131 mmol/L — ABNORMAL LOW (ref 135–145)

## 2015-12-05 LAB — GLUCOSE, CAPILLARY
GLUCOSE-CAPILLARY: 230 mg/dL — AB (ref 65–99)
GLUCOSE-CAPILLARY: 196 mg/dL — AB (ref 65–99)
Glucose-Capillary: 201 mg/dL — ABNORMAL HIGH (ref 65–99)
Glucose-Capillary: 228 mg/dL — ABNORMAL HIGH (ref 65–99)
Glucose-Capillary: 198 mg/dL — ABNORMAL HIGH (ref 65–99)
Glucose-Capillary: 242 mg/dL — ABNORMAL HIGH (ref 65–99)

## 2015-12-05 LAB — COMPREHENSIVE METABOLIC PANEL
ALBUMIN: 1.4 g/dL — AB (ref 3.5–5.0)
ALK PHOS: 101 U/L (ref 38–126)
ALT: 13 U/L — AB (ref 17–63)
AST: 18 U/L (ref 15–41)
Anion gap: 11 (ref 5–15)
BUN: 44 mg/dL — AB (ref 6–20)
CALCIUM: 7.6 mg/dL — AB (ref 8.9–10.3)
CO2: 26 mmol/L (ref 22–32)
CREATININE: 6.46 mg/dL — AB (ref 0.61–1.24)
Chloride: 95 mmol/L — ABNORMAL LOW (ref 101–111)
GFR calc Af Amer: 9 mL/min — ABNORMAL LOW (ref >60)
GFR calc non Af Amer: 8 mL/min — ABNORMAL LOW (ref >60)
GLUCOSE: 197 mg/dL — AB (ref 65–99)
Potassium: 3.7 mmol/L (ref 3.5–5.1)
SODIUM: 132 mmol/L — AB (ref 135–145)
Total Bilirubin: 0.4 mg/dL (ref 0.3–1.2)
Total Protein: 5.4 g/dL — ABNORMAL LOW (ref 6.5–8.1)

## 2015-12-05 LAB — CBC WITH DIFFERENTIAL/PLATELET
BASOS PCT: 1 %
Basophils Absolute: 0 10*3/uL (ref 0.0–0.1)
Eosinophils Absolute: 0.5 10*3/uL (ref 0.0–0.7)
Eosinophils Relative: 6 %
HCT: 29.7 % — ABNORMAL LOW (ref 39.0–52.0)
HEMOGLOBIN: 9.2 g/dL — AB (ref 13.0–17.0)
LYMPHS ABS: 1.6 10*3/uL (ref 0.7–4.0)
LYMPHS PCT: 20 %
MCH: 29.7 pg (ref 26.0–34.0)
MCHC: 31 g/dL (ref 30.0–36.0)
MCV: 95.8 fL (ref 78.0–100.0)
MONOS PCT: 10 %
Monocytes Absolute: 0.9 10*3/uL (ref 0.1–1.0)
NEUTROS PCT: 63 %
Neutro Abs: 5.4 10*3/uL (ref 1.7–7.7)
Platelets: 390 10*3/uL (ref 150–400)
RBC: 3.1 MIL/uL — AB (ref 4.22–5.81)
RDW: 14.3 % (ref 11.5–15.5)
WBC: 8.4 10*3/uL (ref 4.0–10.5)

## 2015-12-05 LAB — PHOSPHORUS: Phosphorus: 4.5 mg/dL (ref 2.5–4.6)

## 2015-12-05 LAB — VANCOMYCIN, RANDOM: VANCOMYCIN RM: 21

## 2015-12-05 LAB — MAGNESIUM: Magnesium: 1.6 mg/dL — ABNORMAL LOW (ref 1.7–2.4)

## 2015-12-05 LAB — SURGICAL PCR SCREEN
MRSA, PCR: NEGATIVE
STAPHYLOCOCCUS AUREUS: NEGATIVE

## 2015-12-05 LAB — PROTIME-INR
INR: 1.2 (ref 0.00–1.49)
Prothrombin Time: 15.3 seconds — ABNORMAL HIGH (ref 11.6–15.2)

## 2015-12-05 SURGERY — AMPUTATION, FOOT, RAY
Anesthesia: General | Site: Foot | Laterality: Right

## 2015-12-05 MED ORDER — SODIUM CHLORIDE 0.9 % IV SOLN: INTRAVENOUS | Status: DC

## 2015-12-05 MED ORDER — VANCOMYCIN HCL 10 G IV SOLR
1500.0000 mg | Freq: Once | INTRAVENOUS | Status: AC
  Administered 2015-12-06: 1500 mg via INTRAVENOUS
  Filled 2015-12-05: qty 1500

## 2015-12-05 MED ORDER — SUCCINYLCHOLINE CHLORIDE 20 MG/ML IJ SOLN
INTRAMUSCULAR | Status: DC | PRN
  Administered 2015-12-05: 100 mg via INTRAVENOUS

## 2015-12-05 MED ORDER — ONDANSETRON HCL 4 MG/2ML IJ SOLN
INTRAMUSCULAR | Status: DC | PRN
  Administered 2015-12-05: 4 mg via INTRAVENOUS

## 2015-12-05 MED ORDER — PHENYLEPHRINE HCL 10 MG/ML IJ SOLN
INTRAMUSCULAR | Status: DC | PRN
  Administered 2015-12-05 (×2): 120 ug via INTRAVENOUS
  Administered 2015-12-05: 40 ug via INTRAVENOUS
  Administered 2015-12-05: 120 ug via INTRAVENOUS

## 2015-12-05 MED ORDER — ONDANSETRON HCL 4 MG/2ML IJ SOLN: 4.0000 mg | Freq: Four times a day (QID) | INTRAMUSCULAR | Status: DC | PRN

## 2015-12-05 MED ORDER — FENTANYL CITRATE (PF) 250 MCG/5ML IJ SOLN
INTRAMUSCULAR | Status: AC
  Filled 2015-12-05: qty 5

## 2015-12-05 MED ORDER — ONDANSETRON HCL 4 MG/2ML IJ SOLN: 4.0000 mg | Freq: Once | INTRAMUSCULAR | Status: DC | PRN

## 2015-12-05 MED ORDER — METHOCARBAMOL 1000 MG/10ML IJ SOLN
500.0000 mg | Freq: Four times a day (QID) | INTRAVENOUS | Status: DC | PRN
  Filled 2015-12-05: qty 5

## 2015-12-05 MED ORDER — ACETAMINOPHEN 650 MG RE SUPP: 650.0000 mg | Freq: Four times a day (QID) | RECTAL | Status: DC | PRN

## 2015-12-05 MED ORDER — METHOCARBAMOL 500 MG PO TABS: 500.0000 mg | ORAL_TABLET | Freq: Four times a day (QID) | ORAL | Status: DC | PRN

## 2015-12-05 MED ORDER — FENTANYL CITRATE (PF) 100 MCG/2ML IJ SOLN
INTRAMUSCULAR | Status: DC | PRN
  Administered 2015-12-05: 100 ug via INTRAVENOUS

## 2015-12-05 MED ORDER — FENTANYL CITRATE (PF) 100 MCG/2ML IJ SOLN: 25.0000 ug | INTRAMUSCULAR | Status: DC | PRN

## 2015-12-05 MED ORDER — ACETAMINOPHEN 325 MG PO TABS: 650.0000 mg | ORAL_TABLET | Freq: Four times a day (QID) | ORAL | Status: DC | PRN

## 2015-12-05 MED ORDER — ONDANSETRON HCL 4 MG PO TABS: 4.0000 mg | ORAL_TABLET | Freq: Four times a day (QID) | ORAL | Status: DC | PRN

## 2015-12-05 MED ORDER — LIDOCAINE 2% (20 MG/ML) 5 ML SYRINGE
INTRAMUSCULAR | Status: AC
  Filled 2015-12-05: qty 10

## 2015-12-05 MED ORDER — METOCLOPRAMIDE HCL 5 MG PO TABS: 5.0000 mg | ORAL_TABLET | Freq: Three times a day (TID) | ORAL | Status: DC | PRN

## 2015-12-05 MED ORDER — LIDOCAINE 2% (20 MG/ML) 5 ML SYRINGE
INTRAMUSCULAR | Status: DC | PRN
  Administered 2015-12-05: 80 mg via INTRAVENOUS

## 2015-12-05 MED ORDER — OXYCODONE HCL 5 MG PO TABS
5.0000 mg | ORAL_TABLET | ORAL | Status: DC | PRN
  Administered 2015-12-06: 5 mg via ORAL
  Filled 2015-12-05: qty 1

## 2015-12-05 MED ORDER — 0.9 % SODIUM CHLORIDE (POUR BTL) OPTIME
TOPICAL | Status: DC | PRN
  Administered 2015-12-05: 1000 mL

## 2015-12-05 MED ORDER — PROPOFOL 10 MG/ML IV BOLUS
INTRAVENOUS | Status: DC | PRN
  Administered 2015-12-05: 60 mg via INTRAVENOUS
  Administered 2015-12-05: 110 mg via INTRAVENOUS

## 2015-12-05 MED ORDER — METOCLOPRAMIDE HCL 5 MG/ML IJ SOLN: 5.0000 mg | Freq: Three times a day (TID) | INTRAMUSCULAR | Status: DC | PRN

## 2015-12-05 SURGICAL SUPPLY — 30 items
BLADE SAW SGTL MED 73X18.5 STR (BLADE) IMPLANT
BNDG COHESIVE 4X5 TAN STRL (GAUZE/BANDAGES/DRESSINGS) ×3 IMPLANT
BNDG GAUZE ELAST 4 BULKY (GAUZE/BANDAGES/DRESSINGS) ×3 IMPLANT
COVER SURGICAL LIGHT HANDLE (MISCELLANEOUS) ×6 IMPLANT
DRAPE U-SHAPE 47X51 STRL (DRAPES) ×6 IMPLANT
DRSG ADAPTIC 3X8 NADH LF (GAUZE/BANDAGES/DRESSINGS) ×3 IMPLANT
DRSG PAD ABDOMINAL 8X10 ST (GAUZE/BANDAGES/DRESSINGS) ×3 IMPLANT
DURAPREP 26ML APPLICATOR (WOUND CARE) ×3 IMPLANT
ELECT REM PT RETURN 9FT ADLT (ELECTROSURGICAL) ×3
ELECTRODE REM PT RTRN 9FT ADLT (ELECTROSURGICAL) ×1 IMPLANT
GAUZE SPONGE 4X4 12PLY STRL (GAUZE/BANDAGES/DRESSINGS) ×3 IMPLANT
GLOVE BIOGEL PI IND STRL 9 (GLOVE) ×1 IMPLANT
GLOVE BIOGEL PI INDICATOR 9 (GLOVE) ×2
GLOVE SURG ORTHO 9.0 STRL STRW (GLOVE) ×3 IMPLANT
GOWN STRL REUS W/ TWL LRG LVL3 (GOWN DISPOSABLE) ×1 IMPLANT
GOWN STRL REUS W/ TWL XL LVL3 (GOWN DISPOSABLE) ×2 IMPLANT
GOWN STRL REUS W/TWL LRG LVL3 (GOWN DISPOSABLE) ×2
GOWN STRL REUS W/TWL XL LVL3 (GOWN DISPOSABLE) ×4
KIT BASIN OR (CUSTOM PROCEDURE TRAY) ×3 IMPLANT
KIT ROOM TURNOVER OR (KITS) ×3 IMPLANT
NS IRRIG 1000ML POUR BTL (IV SOLUTION) ×3 IMPLANT
PACK ORTHO EXTREMITY (CUSTOM PROCEDURE TRAY) ×3 IMPLANT
PAD ARMBOARD 7.5X6 YLW CONV (MISCELLANEOUS) ×6 IMPLANT
SPONGE LAP 18X18 X RAY DECT (DISPOSABLE) ×3 IMPLANT
STOCKINETTE IMPERVIOUS LG (DRAPES) IMPLANT
SUT ETHILON 2 0 PSLX (SUTURE) ×6 IMPLANT
TOWEL OR 17X24 6PK STRL BLUE (TOWEL DISPOSABLE) ×3 IMPLANT
TOWEL OR 17X26 10 PK STRL BLUE (TOWEL DISPOSABLE) ×3 IMPLANT
UNDERPAD 30X30 INCONTINENT (UNDERPADS AND DIAPERS) ×3 IMPLANT
WATER STERILE IRR 1000ML POUR (IV SOLUTION) IMPLANT

## 2015-12-05 NOTE — Progress Notes (Signed)
   Daily Progress Note  Assessment/Planning: POD #1 s/p OA+PTA R peroneal artery, PTA R SFA   No obvious complications from yesterday's procedure  ABI to see if measurable improvement  R foot unchanged.  Going to OR today with [name]  Will see back in office in 4 weeks.  Subjective  - 1 Day Post-Op  No complaints.  R foot still hurts.  No Left groin pain  Objective Filed Vitals:   12/04/15 1701 12/04/15 2122 12/05/15 0550 12/05/15 0754  BP: 142/53 147/59 118/52 115/44  Pulse: 76 91 88 82  Temp: 97.8 F (36.6 C) 98 F (36.7 C) 99.5 F (37.5 C) 100 F (37.8 C)  TempSrc: Oral Oral Oral Oral  Resp: 18 18 20 18   Height:      Weight:  225 lb 1.4 oz (102.1 kg)    SpO2: 100% 99% 98% 97%    Intake/Output Summary (Last 24 hours) at 12/05/15 0830 Last data filed at 12/05/15 [id number]  Gross per 24 hour  Intake   9510 ml  Output   7200 ml  Net   2310 ml   VASC  L groin: no hematoma, no pulsatile mass, no bruit; R foot unchanged, dry gangrene in R great toe  Laboratory CBC    Component Value Date/Time   WBC 10.5 12/04/2015 1514   HGB 10.0* 12/04/2015 1514   HCT 32.4* 12/04/2015 1514   PLT 393 12/04/2015 1514    BMET    Component Value Date/Time   NA 131* 12/04/2015 0404   NA 132* 12/04/2015 0404   NA 131* 12/04/2015 0404   K 3.6 12/04/2015 0404   K 3.6 12/04/2015 0404   K 3.5 12/04/2015 0404   CL 94* 12/04/2015 0404   CL 94* 12/04/2015 0404   CL 94* 12/04/2015 0404   CO2 26 12/04/2015 0404   CO2 27 12/04/2015 0404   CO2 27 12/04/2015 0404   GLUCOSE 276* 12/04/2015 0404   GLUCOSE 269* 12/04/2015 0404   GLUCOSE 267* 12/04/2015 0404   BUN 40* 12/04/2015 0404   BUN 41* 12/04/2015 0404   BUN 41* 12/04/2015 0404   CREATININE 6.26* 12/04/2015 1514   CREATININE 4.14* 01/30/2014 1328   CALCIUM 7.4* 12/04/2015 0404   CALCIUM 7.5* 12/04/2015 0404   CALCIUM 7.5* 12/04/2015 0404   GFRNONAA 8* 12/04/2015 1514   GFRAA 9* 12/04/2015 1514    Adele Barthel,  MD [facility] Office: [phone] Pager: [phone]  12/05/2015, 8:30 AM

## 2015-12-05 NOTE — Care Management Important Message (Signed)
Important Message  Patient Details  Name: Jack Chung MRN: [id number] Date of Birth: 1940/06/29   Medicare Important Message Given:  Yes    Nathen May 12/05/2015, 12:18 PM

## 2015-12-05 NOTE — Progress Notes (Signed)
[facility] Progress Note    Subjective:  "I'm doing OK...nothing new..." Awake, alert, denies pain/discomfort. No issues reported concerning CAPD. Incontinent of stool.   Objective Filed Vitals:   12/04/15 1701 12/04/15 2122 12/05/15 0550 12/05/15 0754  BP: 142/53 147/59 118/52 115/44  Pulse: 76 91 88 82  Temp: 97.8 F (36.6 C) 98 F (36.7 C) 99.5 F (37.5 C) 100 F (37.8 C)  TempSrc: Oral Oral Oral Oral  Resp: 18 18 20 18   Height:      Weight:  102.1 kg (225 lb 1.4 oz)    SpO2: 100% 99% 98% 97%   Physical Exam General: Obese, NAD Heart: HD distant, S1,S2, RRR II/VI systolic M Lungs: Bilateral breath sounds dec in bases CTA A/P Abdomen: abdomen soft, nontender. PD catheter LUQ drsg intact Extremities: L BKA. SCDs LE no edema. R great toe gangrene Dialysis Access: RUA AVF + bruit  Dialysis: CAPD edw 96kg 4 exchanges of 3000 during the day with 4h dwell, overnight 2500 x 8 hrs  Additional Objective Labs: Basic Metabolic Panel:  Recent Labs Lab 12/03/15 0540 12/03/15 0550 12/04/15 0404 12/04/15 1514  NA 132* 131* 132*  131*  131*  --   K 3.4* 3.4* 3.6  3.5  3.6  --   CL 95* 95* 94*  94*  94*  --   CO2 27 26 27  27  26   --   GLUCOSE 240* 239* 269*  267*  276*  --   BUN 42* 42* 41*  41*  40*  --   CREATININE 6.41* 6.23* 5.82*  5.77*  5.96* 6.26*  CALCIUM 7.5* 7.4* 7.5*  7.5*  7.4*  --   PHOS  --  4.0 3.9  --    Liver Function Tests:  Recent Labs Lab 12/02/15 0655 12/03/15 0540 12/03/15 0550 12/04/15 0404  AST 29 24  --  20  ALT 14* 14*  --  12*  ALKPHOS 94 96  --  97  BILITOT 0.7 0.4  --  0.3  PROT 5.6* 5.3*  --  5.4*  ALBUMIN 1.7* 1.6* 1.6* 1.5*  1.5*   CBC:  Recent Labs Lab 12/02/15 0655 12/03/15 0540 12/04/15 0404 12/04/15 1514 12/05/15 0758  WBC 10.2 8.2 8.4 10.5 8.4  NEUTROABS 6.5 5.4 5.4  --  5.4  HGB 8.1* 9.0* 8.9* 10.0* 9.2*  HCT 25.9* 29.0* 28.8* 32.4* 29.7*  MCV 93.2 93.5 95.4 96.1 95.8  PLT  439* 358 404* 393 390   Blood Culture    Component Value Date/Time   SDES BLOOD LEFT ANTECUBITAL 12/01/2015 1956   SPECREQUEST IN PEDIATRIC BOTTLE 3CC 12/01/2015 1956   CULT NO GROWTH 3 DAYS 12/01/2015 1956   REPTSTATUS PENDING 12/01/2015 1956    CBG:  Recent Labs Lab 12/04/15 0803 12/04/15 1142 12/04/15 1700 12/04/15 2119 12/05/15 0752  GLUCAP 208* 164* 132* 317* 201*   Studies/Results: No results found. Medications:   . allopurinol  100 mg Oral Daily  . aspirin  325 mg Oral Daily  . calcium carbonate  2 tablet Oral TID WC  . [START ON 12/11/2015] darbepoetin (ARANESP) injection - DIALYSIS  150 mcg Intravenous Q Thu-HD  . dialysis solution 2.5% low-MG/low-CA   Intraperitoneal Q6H  . feeding supplement (NEPRO CARB STEADY)  237 mL Oral BID BM  . gentamicin cream  1 application Topical QPM  . heparin  5,000 Units Subcutaneous Q8H  . insulin aspart  0-9 Units Subcutaneous TID WC  . insulin glargine  20 Units  Subcutaneous Q breakfast  . levothyroxine  112 mcg Oral QAC breakfast  . loratadine  10 mg Oral Daily  . metoCLOPramide  10 mg Oral TID AC  . midodrine  10 mg Oral Q breakfast  . mirtazapine  15 mg Oral QHS  . multivitamin  1 tablet Oral QHS  . nitroGLYCERIN  0.2 mg Transdermal Daily  . pantoprazole  40 mg Oral Daily  . piperacillin-tazobactam (ZOSYN)  IV  2.25 g Intravenous Q8H  . polyethylene glycol  17 g Oral Daily  . sodium chloride flush  3 mL Intravenous Q12H     Assessment/Plan: 1 Gangrene R great toe / PVD/ Sp Dr. Bridgett Larsson successful intervention on his right peroneal artery and SFA. On Zosyn per primary. Low grade temps. WBC 8.4 Going to OR per [name] for R Ray amputation of R great toe.  2 ESRD on CAPD. OP EDW 96 kg. Cont OP orders. K+3.6 recheck renal panel today.  3. Vol/hypotension: Last wt 102.1 Wt up 6.1 kg. May need to adjust CAPD. No edema. BP controlled. Chron hypotension on midodrine 4 Anemia: HGB 9.2 ESA ordered yesterday-do not see that he  has rec'd this. F/U to see if he rec'd dose.  Follow HGB.  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 Continue binders. 6 Nutrition: Albumin 1.6-may be D/T infection. Renal diet with fld restrictions. Add prostat/Renal vits/dietary consult for protein malnutrition.  7. DM per primary.   [name] NP-C 12/05/2015, 9:04 AM  [facility] [phone]  Pt seen, examined and agree w A/P as above.  [name] MD [employer] pager [phone]    cell [phone] 12/05/2015, 2:08 PM

## 2015-12-05 NOTE — Anesthesia Preprocedure Evaluation (Addendum)
Anesthesia Evaluation  Patient identified by MRN, date of birth, ID band Patient awake    Reviewed: Allergy & Precautions, NPO status , Patient's Chart, lab work & pertinent test results  Airway Mallampati: II  TM Distance: >3 FB Neck ROM: Full    Dental  (+) Edentulous Upper, Edentulous Lower   Pulmonary sleep apnea , pneumonia, resolved,    Pulmonary exam normal breath sounds clear to auscultation       Cardiovascular hypertension, Pt. on medications + CAD, + CABG (5v 2011), + Peripheral Vascular Disease and +CHF  Normal cardiovascular exam+ dysrhythmias (second degree AV block)  Rhythm:Regular Rate:Normal     Neuro/Psych  Neuromuscular disease (Diabetic neuropathy) negative psych ROS   GI/Hepatic Neg liver ROS, GERD  Medicated,Diabetic gastroparesis   Endo/Other  diabetes, Poorly Controlled, Type 2, Insulin DependentHypothyroidism   Renal/GU CRF and DialysisRenal diseaseOn peritoneal dialysis- Still has AV fistula Right arm  negative genitourinary   Musculoskeletal  (+) Arthritis , Osteoarthritis,  Gangrene Right great toe, cellulitis/osteomyelitis right foot   Abdominal (+) + obese,   Peds  Hematology  (+) anemia ,   Anesthesia Other Findings   Reproductive/Obstetrics                         Anesthesia Physical Anesthesia Plan  ASA: IV  Anesthesia Plan: General   Post-op Pain Management:    Induction: Intravenous  Airway Management Planned: LMA  Additional Equipment:   Intra-op Plan:   Post-operative Plan: Extubation in OR  Informed Consent: I have reviewed the patients History and Physical, chart, labs and discussed the procedure including the risks, benefits and alternatives for the proposed anesthesia with the patient or authorized representative who has indicated his/her understanding and acceptance.     Plan Discussed with: CRNA, Anesthesiologist and  Surgeon  Anesthesia Plan Comments:         Anesthesia Quick Evaluation

## 2015-12-05 NOTE — H&P (Signed)
Jack Chung is an 75 y.o. male.   Chief Complaint: Dry gangrene right great toe HPI: Patient is 75 year old gentleman with diabetic insensate neuropathy peripheral vascular disease status post a left transtibial amputation who is status post revascularization to the right lower extremity who has persistent dry gangrenous changes of the right great toe.  Past Medical History  Diagnosis Date  . Hypertension   . Hypothyroidism   . CHF (congestive heart failure) ([place])   . Anemia   . Blind left eye   . Sleep apnea     not on CPAP  . Coronary artery disease     CABG x 11 Jun 2009.  MRSA infections of incsions  . Arthritis   . Sarcoidosis ([place])     Hx:of  . Diabetic retinopathy ([place])     Hx: of bilateral  . Edema   . Hyperlipidemia   . 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 Atherosclerosis of native arteries of the extremities, unspecified   . Eczema     Hx: of  . Insomnia   . Seasonal allergies   . History of blood transfusion     with heart surgery  . Coronary atherosclerosis of native coronary artery   . Atherosclerosis of native arteries of the extremities, unspecified   . Pneumonia   . Diabetes mellitus     type 2  . ESRD on dialysis [facility])     peritoneal dialysis - 5 times a time 9a, 12n, 3p, 6p, 9p  . GERD (gastroesophageal reflux disease)   . Neuropathy ([place])   . Cancer ([place])     skin cancer - melanoma on back  . Gangrene [facility])     Past Surgical History  Procedure Laterality Date  . Cardiac surgery      total of 6 surgeries, 5 related to mrsa  . Coronary artery bypass graft  06/2009  . Cataract surgery      Hx: of  . Debridements      Hx: of secondary to MRSA  . Av fistula placement Right 10/11/2012    Procedure: ARTERIOVENOUS (AV) FISTULA CREATION;  Surgeon: Rosetta Posner, MD;  Location: [place];  Service: Vascular;  Laterality: Right;  . Insertion of dialysis catheter Left   . Capd insertion N/A 11/28/2012    Procedure: LAPAROSCOPIC  PERITONEAL DIALYSIS CATHETER PLACEMENT;  Surgeon: Ralene Ok, MD;  Location: [place];  Service: General;  Laterality: N/A;  . Left heart catheterization with coronary/graft angiogram N/A 01/31/2014    Procedure: LEFT HEART CATHETERIZATION WITH Beatrix Fetters;  Surgeon: Troy Sine, MD;  Location: [facility] CATH LAB;  Service: Cardiovascular;  Laterality: N/A;  . Peripheral vascular catheterization N/A 04/28/2015    Procedure: Abdominal Aortogram w/Lower Extremity;  Surgeon: Conrad [place], MD;  Location: [place] CV LAB;  Service: Cardiovascular;  Laterality: N/A;  . Amputation Left 04/30/2015    Procedure: 1st Ray Amputation Left Foot;  Surgeon: Newt Minion, MD;  Location: [place];  Service: Orthopedics;  Laterality: Left;  . Esophagogastroduodenoscopy N/A 05/20/2015    Procedure: ESOPHAGOGASTRODUODENOSCOPY (EGD);  Surgeon: Wilford Corner, MD;  Location: [facility] ENDOSCOPY;  Service: Endoscopy;  Laterality: N/A;  Possible esophageal dilatation  . Eye surgery Bilateral     cataract surgery with lens implants  . Amputation Left 06/13/2015    Procedure: Left Below Knee Amputation;  Surgeon: Newt Minion, MD;  Location: [place];  Service: Orthopedics;  Laterality: Left;  . Peripheral vascular catheterization N/A  10/30/2015    Procedure: Abdominal Aortogram;  Surgeon: Conrad [place], MD;  Location: [place] CV LAB;  Service: Cardiovascular;  Laterality: N/A;  . Lower extremity angiogram Right 10/30/2015    Procedure: Lower Extremity Angiogram;  Surgeon: Conrad Waub[place], MD;  Location: [place] CV LAB;  Service: Cardiovascular;  Laterality: Right;  . Peripheral vascular catheterization N/A 12/04/2015    Procedure: Abdominal Aortogram w/Lower Extremity;  Surgeon: Conrad [place], MD;  Location: [place] CV LAB;  Service: Cardiovascular;  Laterality: N/A;  . Peripheral vascular catheterization Right 12/04/2015    Procedure: Peripheral Vascular Atherectomy;  Surgeon: Conrad [place], MD;  Location: [place] CV LAB;  Service: Cardiovascular;  Laterality: Right;  Peroneal, Superficial femoral    Family History  Problem Relation Age of Onset  . COPD Mother   . COPD Father    Social History:  reports that he has never smoked. He has never used smokeless tobacco. He reports that he does not drink alcohol or use illicit drugs.  Allergies:  Allergies  Allergen Reactions  . Flu Virus Vaccine Other (See Comments)    Gets the flu, patient has bad reaction to the flu vaccine (gets the flu)  . Lipitor [Atorvastatin] Other (See Comments)    Leg cramps and liver enzymes go "out of whack"  . Other Diarrhea and Nausea And Vomiting    NO TREE NUTS!!  . Peanut-Containing Drug Products Nausea And Vomiting    Medications Prior to Admission  Medication Sig Dispense Refill  . allopurinol (ZYLOPRIM) 100 MG tablet Take 100 mg by mouth daily.    Marland Kitchen aspirin 325 MG EC tablet Take 325 mg by mouth daily.    . calcium carbonate (TUMS - DOSED IN MG ELEMENTAL CALCIUM) 500 MG chewable tablet Chew 2 tablets by mouth 3 (three) times daily with meals.     . cetirizine (ZYRTEC) 10 MG tablet Take 10 mg by mouth daily as needed for allergies.     . ciprofloxacin (CIPRO) 500 MG tablet Take 500 mg by mouth 2 (two) times daily.  0  . fluconazole (DIFLUCAN) 100 MG tablet Take 100 mg by mouth daily. Start date 11/24/15 to last 7 days  0  . gentamicin cream (GARAMYCIN) 0.1 % Apply 1 application topically daily. Applies to stomach.    . insulin aspart (NOVOLOG) 100 UNIT/ML injection Inject 10 Units into the skin 3 (three) times daily with meals. To give 10 units PLUS SSI -Take none if blood sugar < 150, 151-200 =  2 units, 201-250 = 4 units, 251-300 = 6 units, 301-350 = 8 units    . Insulin Glargine (LANTUS SOLOSTAR) 100 UNIT/ML Solostar Pen Inject 20 Units into the skin daily with breakfast.    . Lactobacillus (ACIDOPHILUS PROBIOTIC PO) Take 1 tablet by mouth daily.     Marland Kitchen lactulose (CHRONULAC) 10 GM/15ML solution Take 10  g by mouth every 4 (four) hours as needed for mild constipation.    Marland Kitchen levothyroxine (SYNTHROID, LEVOTHROID) 112 MCG tablet LABS/APPOINTMENT OVERDUE, 1 by mouth daily in the morning 30 minutes before breakfast for Thyroid (Patient taking differently: Take 112 mcg by mouth daily before breakfast. ) 15 tablet 0  . loperamide (IMODIUM) 2 MG capsule Take 2 mg by mouth daily as needed for diarrhea or loose stools.    . metoCLOPramide (REGLAN) 5 MG tablet Take 2 tablets (10 mg total) by mouth 3 (three) times daily before meals. 180 tablet 0  . midodrine (PROAMATINE) 10 MG  tablet Take 1 tablet (10 mg total) by mouth 3 (three) times daily. (Patient taking differently: Take 10 mg by mouth every morning. ) 270 tablet 3  . mirtazapine (REMERON) 15 MG tablet Take 15 mg by mouth at bedtime.   0  . multivitamin (RENA-VIT) TABS tablet Take 1 tablet by mouth daily.    . Nutritional Supplements (FEEDING SUPPLEMENT, NEPRO CARB STEADY,) LIQD Drink 1 can by mouth once daily    . omeprazole (PRILOSEC) 10 MG capsule Take 10 mg by mouth daily.     Marland Kitchen OVER THE COUNTER MEDICATION LiquiCell: Mix in orange juice and drink daily (afternoon or evening)    . potassium chloride SA (K-DUR,KLOR-CON) 20 MEQ tablet Take 20 mEq by mouth daily.    . predniSONE (DELTASONE) 20 MG tablet Take 20 mg by mouth daily as needed (for joint pain from R.A. and O.A.).    Marland Kitchen UNABLE TO FIND Peritoneal dialysis: Every 4 waking hours (0800, 1200, 1600, 2000)    . nitroGLYCERIN (NITRODUR - DOSED IN MG/24 HR) 0.2 mg/hr patch Place patch on the top of right foot every 24 hours, remove the previous patch. (Patient taking differently: Place 0.2 mg onto the skin daily. Place patch on the top of right foot every 24 hours, remove the previous patch.) 30 patch 0    Results for orders placed or performed during the hospital encounter of 12/01/15 (from the past 48 hour(s))  Glucose, capillary     Status: Abnormal   Collection Time: 12/03/15  8:05 AM  Result Value  Ref Range   Glucose-Capillary 255 (H) 65 - 99 mg/dL  Glucose, capillary     Status: Abnormal   Collection Time: 12/03/15 12:40 PM  Result Value Ref Range   Glucose-Capillary 167 (H) 65 - 99 mg/dL  Glucose, capillary     Status: Abnormal   Collection Time: 12/03/15  5:20 PM  Result Value Ref Range   Glucose-Capillary 308 (H) 65 - 99 mg/dL  Glucose, capillary     Status: Abnormal   Collection Time: 12/03/15  9:39 PM  Result Value Ref Range   Glucose-Capillary 346 (H) 65 - 99 mg/dL  Basic metabolic panel     Status: Abnormal   Collection Time: 12/04/15  4:04 AM  Result Value Ref Range   Sodium 131 (L) 135 - 145 mmol/L   Potassium 3.6 3.5 - 5.1 mmol/L   Chloride 94 (L) 101 - 111 mmol/L   CO2 26 22 - 32 mmol/L   Glucose, Bld 276 (H) 65 - 99 mg/dL   BUN 40 (H) 6 - 20 mg/dL   Creatinine, Ser 5.96 (H) 0.61 - 1.24 mg/dL   Calcium 7.4 (L) 8.9 - 10.3 mg/dL   GFR calc non Af Amer 8 (L) >60 mL/min   GFR calc Af Amer 10 (L) >60 mL/min    Comment: (NOTE) The eGFR has been calculated using the CKD EPI equation. This calculation has not been validated in all clinical situations. eGFR's persistently <60 mL/min signify possible Chronic Kidney Disease.    Anion gap 11 5 - 15  CBC WITH DIFFERENTIAL     Status: Abnormal   Collection Time: 12/04/15  4:04 AM  Result Value Ref Range   WBC 8.4 4.0 - 10.5 K/uL   RBC 3.02 (L) 4.22 - 5.81 MIL/uL   Hemoglobin 8.9 (L) 13.0 - 17.0 g/dL   HCT 28.8 (L) 39.0 - 52.0 %   MCV 95.4 78.0 - 100.0 fL   MCH 29.5 26.0 -  34.0 pg   MCHC 30.9 30.0 - 36.0 g/dL   RDW 14.4 11.5 - 15.5 %   Platelets 404 (H) 150 - 400 K/uL   Neutrophils Relative % 65 %   Neutro Abs 5.4 1.7 - 7.7 K/uL   Lymphocytes Relative 16 %   Lymphs Abs 1.3 0.7 - 4.0 K/uL   Monocytes Relative 11 %   Monocytes Absolute 0.9 0.1 - 1.0 K/uL   Eosinophils Relative 8 %   Eosinophils Absolute 0.7 0.0 - 0.7 K/uL   Basophils Relative 0 %   Basophils Absolute 0.0 0.0 - 0.1 K/uL  Comprehensive  metabolic panel     Status: Abnormal   Collection Time: 12/04/15  4:04 AM  Result Value Ref Range   Sodium 132 (L) 135 - 145 mmol/L   Potassium 3.6 3.5 - 5.1 mmol/L   Chloride 94 (L) 101 - 111 mmol/L   CO2 27 22 - 32 mmol/L   Glucose, Bld 269 (H) 65 - 99 mg/dL   BUN 41 (H) 6 - 20 mg/dL   Creatinine, Ser 5.82 (H) 0.61 - 1.24 mg/dL   Calcium 7.5 (L) 8.9 - 10.3 mg/dL   Total Protein 5.4 (L) 6.5 - 8.1 g/dL   Albumin 1.5 (L) 3.5 - 5.0 g/dL   AST 20 15 - 41 U/L   ALT 12 (L) 17 - 63 U/L   Alkaline Phosphatase 97 38 - 126 U/L   Total Bilirubin 0.3 0.3 - 1.2 mg/dL   GFR calc non Af Amer 9 (L) >60 mL/min   GFR calc Af Amer 10 (L) >60 mL/min    Comment: (NOTE) The eGFR has been calculated using the CKD EPI equation. This calculation has not been validated in all clinical situations. eGFR's persistently <60 mL/min signify possible Chronic Kidney Disease.    Anion gap 11 5 - 15  Renal function panel     Status: Abnormal   Collection Time: 12/04/15  4:04 AM  Result Value Ref Range   Sodium 131 (L) 135 - 145 mmol/L   Potassium 3.5 3.5 - 5.1 mmol/L   Chloride 94 (L) 101 - 111 mmol/L   CO2 27 22 - 32 mmol/L   Glucose, Bld 267 (H) 65 - 99 mg/dL   BUN 41 (H) 6 - 20 mg/dL   Creatinine, Ser 5.77 (H) 0.61 - 1.24 mg/dL   Calcium 7.5 (L) 8.9 - 10.3 mg/dL   Phosphorus 3.9 2.5 - 4.6 mg/dL   Albumin 1.5 (L) 3.5 - 5.0 g/dL   GFR calc non Af Amer 9 (L) >60 mL/min   GFR calc Af Amer 10 (L) >60 mL/min    Comment: (NOTE) The eGFR has been calculated using the CKD EPI equation. This calculation has not been validated in all clinical situations. eGFR's persistently <60 mL/min signify possible Chronic Kidney Disease.    Anion gap 10 5 - 15  Vancomycin, random     Status: None   Collection Time: 12/04/15  4:04 AM  Result Value Ref Range   Vancomycin Rm 23     Comment:        Random Vancomycin therapeutic range is dependent on dosage and time of specimen collection. A peak range is 20.0-40.0  ug/mL A trough range is 5.0-15.0 ug/mL          Protime-INR     Status: None   Collection Time: 12/04/15  4:04 AM  Result Value Ref Range   Prothrombin Time 14.6 11.6 - 15.2 seconds   INR 1.12  0.00 - 1.49  Glucose, capillary     Status: Abnormal   Collection Time: 12/04/15  8:03 AM  Result Value Ref Range   Glucose-Capillary 208 (H) 65 - 99 mg/dL  POCT Activated clotting time     Status: None   Collection Time: 12/04/15  9:47 AM  Result Value Ref Range   Activated Clotting Time 219 seconds  POCT Activated clotting time     Status: None   Collection Time: 12/04/15 10:21 AM  Result Value Ref Range   Activated Clotting Time 230 seconds  POCT Activated clotting time     Status: None   Collection Time: 12/04/15 10:56 AM  Result Value Ref Range   Activated Clotting Time 230 seconds  Glucose, capillary     Status: Abnormal   Collection Time: 12/04/15 11:42 AM  Result Value Ref Range   Glucose-Capillary 164 (H) 65 - 99 mg/dL  POCT Activated clotting time     Status: None   Collection Time: 12/04/15 11:44 AM  Result Value Ref Range   Activated Clotting Time 202 seconds  POCT Activated clotting time     Status: None   Collection Time: 12/04/15 12:30 PM  Result Value Ref Range   Activated Clotting Time 186 seconds  POCT Activated clotting time     Status: None   Collection Time: 12/04/15  1:18 PM  Result Value Ref Range   Activated Clotting Time 175 seconds  CBC     Status: Abnormal   Collection Time: 12/04/15  3:14 PM  Result Value Ref Range   WBC 10.5 4.0 - 10.5 K/uL   RBC 3.37 (L) 4.22 - 5.81 MIL/uL   Hemoglobin 10.0 (L) 13.0 - 17.0 g/dL   HCT 32.4 (L) 39.0 - 52.0 %   MCV 96.1 78.0 - 100.0 fL   MCH 29.7 26.0 - 34.0 pg   MCHC 30.9 30.0 - 36.0 g/dL   RDW 14.2 11.5 - 15.5 %   Platelets 393 150 - 400 K/uL  Creatinine, serum     Status: Abnormal   Collection Time: 12/04/15  3:14 PM  Result Value Ref Range   Creatinine, Ser 6.26 (H) 0.61 - 1.24 mg/dL   GFR calc non Af Amer  8 (L) >60 mL/min   GFR calc Af Amer 9 (L) >60 mL/min    Comment: (NOTE) The eGFR has been calculated using the CKD EPI equation. This calculation has not been validated in all clinical situations. eGFR's persistently <60 mL/min signify possible Chronic Kidney Disease.   Glucose, capillary     Status: Abnormal   Collection Time: 12/04/15  5:00 PM  Result Value Ref Range   Glucose-Capillary 132 (H) 65 - 99 mg/dL  Glucose, capillary     Status: Abnormal   Collection Time: 12/04/15  9:19 PM  Result Value Ref Range   Glucose-Capillary 317 (H) 65 - 99 mg/dL   No results found.  Review of Systems  All other systems reviewed and are negative.   Blood pressure 118/52, pulse 88, temperature 99.5 F (37.5 C), temperature source Oral, resp. rate 20, height _0  (1.778 m), weight 102.1 kg (225 lb 1.4 oz), SpO2 98 %. Physical Exam  On examination patient's right lower extremity is warm good capillary refill status post a desk rosacea with Dr. Bridgett Larsson. He has dry gangrenous changes of the great toe with cellulitis extending proximal to the MTP joint. Assessment/Plan Assessment: Diabetic insensate neuropathy peripheral vascular disease status post revascularization to the right lower extremity with dry  gangrenous changes of the right great toe.  Plan: We'll plan for right first ray amputation risks and benefits were discussed including risk of the wound healing. Patient states he understands wishes to proceed at this time.  Newt Minion, MD 12/05/2015, 6:31 AM

## 2015-12-05 NOTE — Anesthesia Procedure Notes (Signed)
Procedure Name: Intubation Date/Time: 12/05/2015 2:58 PM Performed by: Salli Quarry Lakechia Nay Pre-anesthesia Checklist: Patient identified, Emergency Drugs available, Suction available and Patient being monitored Patient Re-evaluated:Patient Re-evaluated prior to inductionOxygen Delivery Method: Circle System Utilized Preoxygenation: Pre-oxygenation with 100% oxygen Intubation Type: IV induction Ventilation: Mask ventilation without difficulty Laryngoscope Size: Mac and 4 Grade View: Grade I Tube type: Oral Tube size: 7.0 mm Number of attempts: 1 Airway Equipment and Method: Stylet Placement Confirmation: ETT inserted through vocal cords under direct vision,  positive ETCO2 and breath sounds checked- equal and bilateral Secured at: 22 cm Tube secured with: Tape Dental Injury: Teeth and Oropharynx as per pre-operative assessment  Comments: LMA size 5 attempted to be place by CRNA x1, then MD attempted x1. LMA size 4 attempted to be placed by MD x1, unable to seat correctly. Adequate mask ventilation between attempts. Decision to convert to ETT, additional induction drugs administered prior to intubation.

## 2015-12-05 NOTE — Anesthesia Postprocedure Evaluation (Signed)
Anesthesia Post Note  Patient: Jack Chung  Procedure(s) Performed: Procedure(s) (LRB):  RIGHT FIRST RAY AMPUTATION (Right)  Patient location during evaluation: PACU Anesthesia Type: General Level of consciousness: awake and alert and oriented Pain management: pain level controlled Vital Signs Assessment: post-procedure vital signs reviewed and stable Respiratory status: spontaneous breathing, nonlabored ventilation, respiratory function stable and patient connected to nasal cannula oxygen Cardiovascular status: blood pressure returned to baseline and stable Postop Assessment: no signs of nausea or vomiting Anesthetic complications: no    Last Vitals:  Filed Vitals:   12/05/15 1542 12/05/15 1558  BP: 134/63 129/62  Pulse: 79 77  Temp:    Resp: 23 23    Last Pain:  Filed Vitals:   12/05/15 1559  PainSc: 0-No pain                 Shail Urbas A.

## 2015-12-05 NOTE — Progress Notes (Signed)
PT Cancellation Note  Patient Details Name: Jack Chung MRN: [id number] DOB: [birth year]   Cancelled Treatment:    Reason Eval/Treat Not Completed: Other (comment) (Pt going to OR for great toe amputation today.  Will f/u tomorrow)   Melvern Banker 12/05/2015, 11:29 AM

## 2015-12-05 NOTE — Op Note (Signed)
12/01/2015 - 12/05/2015  3:13 PM  PATIENT:  Jack Chung    PRE-OPERATIVE DIAGNOSIS:  Dry Gangrene Right Great Toe  POST-OPERATIVE DIAGNOSIS:  Same  PROCEDURE:   RIGHT FIRST RAY AMPUTATION Local tissue rearrangement for wound closure 3 x 7 cm.  SURGEON:  Newt Minion, MD  PHYSICIAN ASSISTANT:None ANESTHESIA:   General  PREOPERATIVE INDICATIONS:  Jack Chung is a  75 y.o. male with a diagnosis of Dry Gangrene Right Great Toe who failed conservative measures and elected for surgical management.    The risks benefits and alternatives were discussed with the patient preoperatively including but not limited to the risks of infection, bleeding, nerve injury, cardiopulmonary complications, the need for revision surgery, among others, and the patient was willing to proceed.  OPERATIVE IMPLANTS: none  OPERATIVE FINDINGS:dry gangrene right great toe with minimal petechial bleeding  OPERATIVE PROCEDURE: patient was brought to the operating room and underwent a general anesthetic. After adequate levels anesthesia obtained patient's right lower extremity was prepped using DuraPrep draped into a sterile field a timeout was called. A racquet incision was made around the ulcerative great toe  And the first ray. The first ray was resected in 1 block of tissue. Electrocautery was used for hemostasis wound was irrigated with normal saline there was minimal petechial bleeding. The local tissue was rearrangement for wound closure 3 x 7 cm. A sterile compressive dressing was applied patient was extubated taken the PACU in stable condition.

## 2015-12-05 NOTE — Progress Notes (Signed)
Pharmacy Antibiotic Note  Jack Chung is a 75 y.o. male admitted on 12/01/2015 with cellulitis.  Continues on Vancomycin and Zosyn, Blood cultures negative to date Continues on PD with random vancomycin level of 21 this AM  For amputation 6/30 Day # 5 of broad spectrum antibiotics -- Please indicate LOT?  Plan: Vancomycin 1500 mg iv x 1 dose 7/1 0800 AM Continue Zosyn 2.25 grams iv Q 8 hours  Height: 5\' 10"  (177.8 cm) Weight: 225 lb 1.4 oz (102.1 kg) IBW/kg (Calculated) : 73  Temp (24hrs), Avg:98.8 F (37.1 C), Min:97.8 F (36.6 C), Max:100 F (37.8 C)   Recent Labs Lab 12/02/15 0655 12/03/15 0540 12/03/15 0550 12/04/15 0404 12/04/15 1514 12/05/15 0758 12/05/15 0944  WBC 10.2 8.2  --  8.4 10.5 8.4  --   CREATININE 6.62* 6.41* 6.23* 5.82*  5.77*  5.96* 6.26* 6.46* 6.61*  VANCORANDOM  --   --   --  23  --  21  --     Estimated Creatinine Clearance: 12.1 mL/min (by C-G formula based on Cr of 6.61).    Allergies  Allergen Reactions  . Flu Virus Vaccine Other (See Comments)    Gets the flu, patient has bad reaction to the flu vaccine (gets the flu)  . Lipitor [Atorvastatin] Other (See Comments)    Leg cramps and liver enzymes go "out of whack"  . Other Diarrhea and Nausea And Vomiting    NO TREE NUTS!!  . Peanut-Containing Drug Products Nausea And Vomiting    Thank you for allowing pharmacy to be a part of this patient's care. Anette Guarneri, PharmD [phone] 12/05/2015 11:29 AM

## 2015-12-05 NOTE — Progress Notes (Signed)
[facility] Progress Note  Patient: Jack Chung YEB:[id number]   PCP: [name], MD DOB: Aug 24, 1940   DOA: 12/01/2015   DOS: 12/05/2015   Date of Service: the patient was seen and examined on 12/05/2015  Subjective: Continues to deny any acute complaint. The nausea no vomiting. Scheduled for the amputation of the toe today. Nutrition: tolerating oral diet  Brief hospital course: Pt. with PMH of IDDM, diabetic neuropathy, ESRD on PD, HTN, hypothyroidism, CHF, CAD, CABG, hyperlipidemia, sarcoidosis, Left BKA, arthritis ; admitted on 12/01/2015, with complaint of worsening wound to right great toe, was found to have cellulitis of the right lower leg. Managed at wound care and referred to the hospital. Treated with Ciprofloxacin. 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 Started on Zosyn and Vancomycin. Vascular surgery consulted, ordered CTA of right leg. Status post angioplasty of the right leg. Orthopedic [name] consulted and the patient is scheduled for first ray amputation on 12/05/2015. Currently further plan is continue ABX and monitoring symptoms.  Assessment and Plan:  Cellulitis of right lower extremity Initially treated with vancomycin and Zosyn. We will transition to oral Keflex complete 7 day treatment course for cellulitis as per my discussion with ID. Status post PTA of right leg with right peroneal artery angioplasty as well as right superficial femoral artery angioplasty. Drug-coated angioplasty of right superficial femoral artery. ESR 127, CRP 11.8 Blood cultures no growth to date. After discussion with [name] the patient is agreeable for first ray amputation scheduled on 12/05/2015. Monitor for postoperative recovery.  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 Asymptomatic and no signs of acute blood loss Monitor hematocrit and hemoglobin.  ESRD on peritoneal dialysis [facility]) Nephrology was consulted for PD orders.  Hypokalemia: Replacing,  recheck in the morning.  Protein calorie malnutrition Continue nutritional supplementation.  Gout Continue allopurinol.  CAD (coronary artery disease) Stable at this time. Continue aspirin.  Type 2 diabetes mellitus with polyneuropathy (HCC) Carbohydratemodified/renal diet. Continue Lantus 20 units at breakfast and regular insulin sliding scale. Continue CBG monitoring  Hemoglobin A1c 7.0.  Hypothyroidism Continue levothyroxine.  Pain management: Tylenol Activity: physical therapy recommends home with home health, DME wheelchair ordered Bowel regimen: last BM 6/27 MiraLAX added Diet: Renal and carb modified  DVT Prophylaxis: subcutaneous Heparin and mechanical compression device.  Advance goals of care discussion: Full code  Family Communication: family at bedside  Disposition:  Discharge to home. Expected discharge date: 12/07/2015, pending postoperative recovery as well as PTOT consult.  Consultants: Vascular surgery, nephrology, orthopedic surgery Procedures: Angioplasty of the right superficial femoral as well as right peroneal artery.  Antibiotics: Anti-infectives    Start     Dose/Rate Route Frequency Ordered Stop   12/06/15 0800  vancomycin (VANCOCIN) 1,500 mg in sodium chloride 0.9 % 500 mL IVPB     1,500 mg 250 mL/hr over 120 Minutes Intravenous  Once 12/05/15 1130     12/02/15 1400  piperacillin-tazobactam (ZOSYN) IVPB 2.25 g     2.25 g 100 mL/hr over 30 Minutes Intravenous Every 8 hours 12/02/15 1038     12/02/15 1000  fluconazole (DIFLUCAN) tablet 100 mg     100 mg Oral Daily 12/01/15 2106 12/03/15 1215   12/02/15 0700  piperacillin-tazobactam (ZOSYN) IVPB 2.25 g  Status:  Discontinued     2.25 g 100 mL/hr over 30 Minutes Intravenous Every 12 hours 12/01/15 2141 12/02/15 1038   12/01/15 2300  vancomycin (VANCOCIN) 2,000 mg in sodium chloride 0.9 % 500 mL IVPB  2,000 mg 250 mL/hr over 120 Minutes Intravenous  Once 12/01/15 2141 12/02/15 0012    12/01/15 1900  piperacillin-tazobactam (ZOSYN) IVPB 3.375 g     3.375 g 100 mL/hr over 30 Minutes Intravenous  Once 12/01/15 1857 12/01/15 1953        Intake/Output Summary (Last 24 hours) at 12/05/15 1646 Last data filed at 12/05/15 1520  Gross per 24 hour  Intake  10010 ml  Output   6130 ml  Net   3880 ml   Filed Weights   12/02/15 1900 12/03/15 2143 12/04/15 2122  Weight: 101.334 kg (223 lb 6.4 oz) 101.969 kg (224 lb 12.8 oz) 102.1 kg (225 lb 1.4 oz)    Objective: Physical Exam: Filed Vitals:   12/05/15 0754 12/05/15 1527 12/05/15 1542 12/05/15 1558  BP: 115/44 122/60 134/63 129/62  Pulse: 82 93 79 77  Temp: 100 F (37.8 C) 97.3 F (36.3 C)    TempSrc: Oral     Resp: '18  23 23  ' Height:      Weight:      SpO2: 97% 99% 100% 100%    General: Alert, Awake and Oriented to Time, Place and Person. Appear in no distress Eyes: PERRL, Conjunctiva normal ENT: Oral Mucosa clear moist. Cardiovascular: S1 and S2 Present, no Murmur, Respiratory: Bilateral Air entry equal and Decreased, Clear to Auscultation Abdomen: Bowel Sound present, Soft and no tenderness Skin: Black patches to right great toe with surrounding redness, no Rash  Extremities: +1 Pedal edema, no calf tenderness Neurologic: Grossly no focal neuro deficit. Bilaterally Equal motor strength  Data Reviewed: CBC:  Recent Labs Lab 12/01/15 1237 12/02/15 0655 12/03/15 0540 12/04/15 0404 12/04/15 1514 12/05/15 0758  WBC 12.3* 10.2 8.2 8.4 10.5 8.4  NEUTROABS 9.0* 6.5 5.4 5.4  --  5.4  HGB 11.0* 8.1* 9.0* 8.9* 10.0* 9.2*  HCT 34.8* 25.9* 29.0* 28.8* 32.4* 29.7*  MCV 95.6 93.2 93.5 95.4 96.1 95.8  PLT 414* 439* 358 404* 393 021   Basic Metabolic Panel:  Recent Labs Lab 12/03/15 0540 12/03/15 0550 12/04/15 0404 12/04/15 1514 12/05/15 0758 12/05/15 0944  NA 132* 131* 132*  131*  131*  --  132* 131*  K 3.4* 3.4* 3.6  3.5  3.6  --  3.7 3.6  CL 95* 95* 94*  94*  94*  --  95* 94*  CO2 '27 26 27   27  26  ' --  26 25  GLUCOSE 240* 239* 269*  267*  276*  --  197* 172*  BUN 42* 42* 41*  41*  40*  --  44* 45*  CREATININE 6.41* 6.23* 5.82*  5.77*  5.96* 6.26* 6.46* 6.61*  CALCIUM 7.5* 7.4* 7.5*  7.5*  7.4*  --  7.6* 7.7*  MG 1.4*  --   --   --  1.6*  --   PHOS  --  4.0 3.9  --  4.5 4.5    Liver Function Tests:  Recent Labs Lab 12/02/15 0655 12/03/15 0540 12/03/15 0550 12/04/15 0404 12/05/15 0758 12/05/15 0944  AST 29 24  --  20 18  --   ALT 14* 14*  --  12* 13*  --   ALKPHOS 94 96  --  97 101  --   BILITOT 0.7 0.4  --  0.3 0.4  --   PROT 5.6* 5.3*  --  5.4* 5.4*  --   ALBUMIN 1.7* 1.6* 1.6* 1.5*  1.5* 1.4* 1.5*   No results for input(s): LIPASE,  AMYLASE in the last 168 hours. No results for input(s): AMMONIA in the last 168 hours. Coagulation Profile:  Recent Labs Lab 12/04/15 0404 12/05/15 0758  INR 1.12 1.20   Cardiac Enzymes: No results for input(s): CKTOTAL, CKMB, CKMBINDEX, TROPONINI in the last 168 hours. BNP (last 3 results) No results for input(s): PROBNP in the last 8760 hours.  CBG:  Recent Labs Lab 12/04/15 2119 12/05/15 0752 12/05/15 1153 12/05/15 1419 12/05/15 1530  GLUCAP 317* 201* 230* 228* 196*    Studies: No results found.   Scheduled Meds: . allopurinol  100 mg Oral Daily  . aspirin  325 mg Oral Daily  . calcium carbonate  2 tablet Oral TID WC  . [START ON 12/11/2015] darbepoetin (ARANESP) injection - DIALYSIS  150 mcg Intravenous Q Thu-HD  . dialysis solution 2.5% low-MG/low-CA   Intraperitoneal Q6H  . feeding supplement (NEPRO CARB STEADY)  237 mL Oral BID BM  . gentamicin cream  1 application Topical QPM  . heparin  5,000 Units Subcutaneous Q8H  . insulin aspart  0-9 Units Subcutaneous TID WC  . insulin glargine  20 Units Subcutaneous Q breakfast  . levothyroxine  112 mcg Oral QAC breakfast  . loratadine  10 mg Oral Daily  . metoCLOPramide  10 mg Oral TID AC  . midodrine  10 mg Oral Q breakfast  . mirtazapine  15 mg  Oral QHS  . multivitamin  1 tablet Oral QHS  . nitroGLYCERIN  0.2 mg Transdermal Daily  . pantoprazole  40 mg Oral Daily  . piperacillin-tazobactam (ZOSYN)  IV  2.25 g Intravenous Q8H  . polyethylene glycol  17 g Oral Daily  . sodium chloride flush  3 mL Intravenous Q12H  . [START ON 12/06/2015] vancomycin  1,500 mg Intravenous Once   Continuous Infusions: . sodium chloride     PRN Meds: sodium chloride, acetaminophen **OR** acetaminophen, heparin, heparin, hydrALAZINE, lactulose, loperamide, methocarbamol **OR** methocarbamol (ROBAXIN)  IV, metoCLOPramide **OR** metoCLOPramide (REGLAN) injection, morphine injection, ondansetron **OR** ondansetron (ZOFRAN) IV, oxyCODONE, oxyCODONE-acetaminophen, sodium chloride flush  Time spent: 30 minutes  Author: Berle Mull, MD [facility] 12/05/2015 4:46 PM

## 2015-12-05 NOTE — Transfer of Care (Signed)
Immediate Anesthesia Transfer of Care Note  Patient: Jack Chung  Procedure(s) Performed: Procedure(s):  RIGHT FIRST RAY AMPUTATION (Right)  Patient Location: PACU  Anesthesia Type:General  Level of Consciousness: awake, alert , oriented and patient cooperative  Airway & Oxygen Therapy: Patient Spontanous Breathing and Patient connected to nasal cannula oxygen  Post-op Assessment: Report given to RN and Post -op Vital signs reviewed and stable  Post vital signs: Reviewed and stable  Last Vitals:  Filed Vitals:   12/05/15 0754 12/05/15 1527  BP: 115/44 122/60  Pulse: 82 93  Temp: 37.8 C   Resp: 18     Last Pain:  Filed Vitals:   12/05/15 1531  PainSc: 0-No pain         Complications: No apparent anesthesia complications

## 2015-12-05 NOTE — Progress Notes (Signed)
Orthopedic Tech Progress Note Patient Details:  Jack Chung 02/10/1941 [id number]  Ortho Devices Type of Ortho Device: Postop shoe/boot Ortho Device/Splint Location: RLE foot Ortho Device/Splint Interventions: Ordered, Application   Braulio Bosch 12/05/2015, 8:47 PM

## 2015-12-06 ENCOUNTER — Encounter (HOSPITAL_COMMUNITY): Payer: Self-pay | Admitting: Orthopedic Surgery

## 2015-12-06 ENCOUNTER — Encounter (HOSPITAL_COMMUNITY): Payer: Medicare Other

## 2015-12-06 DIAGNOSIS — I96 Gangrene, not elsewhere classified: Secondary | ICD-10-CM | POA: Diagnosis present

## 2015-12-06 DIAGNOSIS — L03115 Cellulitis of right lower limb: Secondary | ICD-10-CM | POA: Diagnosis present

## 2015-12-06 LAB — RENAL FUNCTION PANEL
ALBUMIN: 1.4 g/dL — AB (ref 3.5–5.0)
ANION GAP: 14 (ref 5–15)
BUN: 44 mg/dL — ABNORMAL HIGH (ref 6–20)
CALCIUM: 7.8 mg/dL — AB (ref 8.9–10.3)
CO2: 24 mmol/L (ref 22–32)
Chloride: 94 mmol/L — ABNORMAL LOW (ref 101–111)
Creatinine, Ser: 6.23 mg/dL — ABNORMAL HIGH (ref 0.61–1.24)
GFR calc non Af Amer: 8 mL/min — ABNORMAL LOW (ref >60)
GFR, EST AFRICAN AMERICAN: 9 mL/min — AB (ref >60)
Glucose, Bld: 232 mg/dL — ABNORMAL HIGH (ref 65–99)
PHOSPHORUS: 5.4 mg/dL — AB (ref 2.5–4.6)
POTASSIUM: 3.7 mmol/L (ref 3.5–5.1)
Sodium: 132 mmol/L — ABNORMAL LOW (ref 135–145)

## 2015-12-06 LAB — CBC
HEMATOCRIT: 30.4 % — AB (ref 39.0–52.0)
Hemoglobin: 9.4 g/dL — ABNORMAL LOW (ref 13.0–17.0)
MCH: 30.3 pg (ref 26.0–34.0)
MCHC: 30.9 g/dL (ref 30.0–36.0)
MCV: 98.1 fL (ref 78.0–100.0)
Platelets: 380 10*3/uL (ref 150–400)
RBC: 3.1 MIL/uL — AB (ref 4.22–5.81)
RDW: 14.4 % (ref 11.5–15.5)
WBC: 7.9 10*3/uL (ref 4.0–10.5)

## 2015-12-06 LAB — GLUCOSE, CAPILLARY
GLUCOSE-CAPILLARY: 220 mg/dL — AB (ref 65–99)
GLUCOSE-CAPILLARY: 293 mg/dL — AB (ref 65–99)
GLUCOSE-CAPILLARY: 243 mg/dL — AB (ref 65–99)
GLUCOSE-CAPILLARY: 256 mg/dL — AB (ref 65–99)

## 2015-12-06 LAB — CULTURE, BLOOD (ROUTINE X 2)
CULTURE: NO GROWTH
Culture: NO GROWTH

## 2015-12-06 MED ORDER — PRO-STAT SUGAR FREE PO LIQD
30.0000 mL | Freq: Two times a day (BID) | ORAL | Status: DC
  Administered 2015-12-06 – 2015-12-07 (×2): 30 mL via ORAL
  Filled 2015-12-06: qty 30

## 2015-12-06 NOTE — Progress Notes (Signed)
   Subjective:  Patient reports pain as mild.    Objective:   VITALS:   Filed Vitals:   12/05/15 1558 12/05/15 1900 12/05/15 2142 12/06/15 0552  BP: 129/62 112/50 101/44 122/55  Pulse: 77 74 66 72  Temp:  97.5 F (36.4 C) 97.7 F (36.5 C) 98.1 F (36.7 C)  TempSrc:  Oral Oral Oral  Resp: 23 20 18 18   Height:      Weight:   104.327 kg (230 lb)   SpO2: 100% 99% 99% 90%    Neurologically intact Neurovascular intact Intact pulses distally Dorsiflexion/Plantar flexion intact Incision: dressing C/D/I and no drainage No cellulitis present Compartment soft   Lab Results  Component Value Date   WBC 7.9 12/06/2015   HGB 9.4* 12/06/2015   HCT 30.4* 12/06/2015   MCV 98.1 12/06/2015   PLT 380 12/06/2015     Assessment/Plan:  1 Day Post-Op   - Expected postop acute blood loss anemia - will monitor for symptoms - Up with PT/OT - DVT ppx - SCDs, ambulation, aspirin - NWB operative extremity - Pain control - Discharge planning  Marianna Payment 12/06/2015, 8:29 AM [phone]

## 2015-12-06 NOTE — Progress Notes (Signed)
Nutrition Follow-up  DOCUMENTATION CODES:  Obesity unspecified  INTERVENTION:  Albumin has a half-life of 21 days and is strongly affected by stress response and inflammatory process, therefore, do not expect to see an improvement in this lab value during acute hospitalization.  -additionally, pt has a chronically low albumin level, likely due to his multitude of chronic inflammatory disease states. His albumin does appear to be trending down, over the last several years, but this is consistent with the normal progressive decline of pt's with high level of complications. Albumin stable x 6 months  Continue Supplementation w/ Nepro daily Wound healing- Will order 30 mL Prostat BID, each supplement provides 100 kcal and 15 grams of protein.   NUTRITION DIAGNOSIS:  Increased nutrient needs related to wound healing as evidenced by estimated needs.  Ongoing  GOAL:  Patient will meet greater than or equal to 90% of their needs   Appears to be met this far, Goal Ongoing  MONITOR:  PO intake, Supplement acceptance, Weight trends, I & O's, Labs, Skin  REASON FOR ASSESSMENT:  Consult "Severe PCM"  ASSESSMENT:  75 y.o. male with medical history significant of type 2 diabetes, diabetic peripheral neuropathy, ESRD on PD, left BKA, hypertension, hypothyroidism, CHF, CAD, CABG, arthritis, sarcoidosis, hyperlipidemia, seasonal allergies who was referred from the wound clinic for evaluation of worsening chronic right foot infection.   Pt reports that he is eating everything that is brought to him, including supplements. He says the reason there are 0% intake recordings in the EMR is because of his surgery and he wasn't allowed to eat.   He does report a chronically depressed appetite, but this is unchanged.   At home pt drinks nepro daily as well as liquacell at night.   Phos was elevated. From discussion with wife, they do not follow a renal diet at home.    Recent Labs Lab 12/03/15 0540   12/05/15 0758 12/05/15 0944 12/06/15 0533  NA 132*  < > 132* 131* 132*  K 3.4*  < > 3.7 3.6 3.7  CL 95*  < > 95* 94* 94*  CO2 27  < > _0 BUN 42*  < > 44* 45* 44*  CREATININE 6.41*  < > 6.46* 6.61* 6.23*  CALCIUM 7.5*  < > 7.6* 7.7* 7.8*  MG 1.4*  --  1.6*  --   --   PHOS  --   < > 4.5 4.5 5.4*  GLUCOSE 240*  < > 197* 172* 232*  < > = values in this interval not displayed.;   Diet Order:  Diet Carb Modified Fluid consistency:: Thin; Room service appropriate?: Yes  Skin:  New surgical wound, s/p toe amputation  Last BM:  7/1  Height:  Ht Readings from Last 1 Encounters:  12/01/15 _1  (1.778 m)   Weight:  Wt Readings from Last 1 Encounters:  12/06/15 212 lb 8.4 oz (96.4 kg)   Wt Readings from Last 10 Encounters:  12/06/15 212 lb 8.4 oz (96.4 kg)  10/17/15 230 lb (104.327 kg)  09/23/15 230 lb (104.327 kg)  09/05/15 233 lb (105.688 kg)  08/15/15 226 lb (102.513 kg)  08/01/15 217 lb (98.431 kg)  07/04/15 217 lb 4.8 oz (98.567 kg)  06/21/15 208 lb 15.9 oz (94.8 kg)  06/12/15 231 lb (104.781 kg)  05/21/15 231 lb 14.8 oz (105.2 kg)    Ideal Body Weight:  70.55 kg (adjusted for L BKA)  BMI:  Body mass index is 30.49 kg/(m^2).  Estimated Nutritional Needs:  Kcal:  1840-3754 Protein:  115-130 grams Fluid:  1.2 L/day  EDUCATION NEEDS:  Education needs addressed  Burtis Junes RD, LDN, CNSC Clinical Nutrition Pager: [phone] 12/06/2015 5:22 PM

## 2015-12-06 NOTE — Progress Notes (Signed)
[facility] Progress Note    Subjective:  "I'm missing a toe down there". No C/O of pain/discomfort at present.    Objective Filed Vitals:   12/05/15 1900 12/05/15 2142 12/06/15 0552 12/06/15 0854  BP: 112/50 101/44 122/55 142/58  Pulse: 74 66 72 79  Temp: 97.5 F (36.4 C) 97.7 F (36.5 C) 98.1 F (36.7 C) 97.9 F (36.6 C)  TempSrc: Oral Oral Oral Oral  Resp: 20 18 18 18   Height:      Weight:  104.327 kg (230 lb)    SpO2: 99% 99% 90% 98%   Physical Exam General: Obese, NAD Heart: HD distant, S1,S2, RRR II/VI systolic M Lungs: Bilateral breath sounds dec in bases CTA A/P Abdomen: abdomen soft, nontender. PD catheter LUQ drsg intact-site unremarkable. Extremities: L BKA. SCDs LE no edema. R great toe gangrene Dialysis Access: RUA AVF + bruit  Dialysis: CAPD edw 96kg 4 exchanges of 3000 during the day with 4h dwell, overnight 2500 x 8 hrs Patient's wife states they have been doing 4 exchanges-Patient uses 1.5% 3000 cc x 2, 2.5% 3000 cc X 1 then dwells 1.5% 3000cc over night.  Additional Objective Labs: Basic Metabolic Panel:  Recent Labs Lab 12/05/15 0758 12/05/15 0944 12/06/15 0533  NA 132* 131* 132*  K 3.7 3.6 3.7  CL 95* 94* 94*  CO2 26 25 24   GLUCOSE 197* 172* 232*  BUN 44* 45* 44*  CREATININE 6.46* 6.61* 6.23*  CALCIUM 7.6* 7.7* 7.8*  PHOS 4.5 4.5 5.4*   Liver Function Tests:  Recent Labs Lab 12/03/15 0540  12/04/15 0404 12/05/15 0758 12/05/15 0944 12/06/15 0533  AST 24  --  20 18  --   --   ALT 14*  --  12* 13*  --   --   ALKPHOS 96  --  97 101  --   --   BILITOT 0.4  --  0.3 0.4  --   --   PROT 5.3*  --  5.4* 5.4*  --   --   ALBUMIN 1.6*  < > 1.5*  1.5* 1.4* 1.5* 1.4*  < > = values in this interval not displayed. No results for input(s): LIPASE, AMYLASE in the last 168 hours. CBC:  Recent Labs Lab 12/03/15 0540 12/04/15 0404 12/04/15 1514 12/05/15 0758 12/06/15 0533  WBC 8.2 8.4 10.5 8.4 7.9  NEUTROABS 5.4 5.4  --   5.4  --   HGB 9.0* 8.9* 10.0* 9.2* 9.4*  HCT 29.0* 28.8* 32.4* 29.7* 30.4*  MCV 93.5 95.4 96.1 95.8 98.1  PLT 358 404* 393 390 380   Blood Culture    Component Value Date/Time   SDES BLOOD LEFT ANTECUBITAL 12/01/2015 1956   SPECREQUEST IN PEDIATRIC BOTTLE 3CC 12/01/2015 1956   CULT NO GROWTH 4 DAYS 12/01/2015 1956   REPTSTATUS PENDING 12/01/2015 1956    Cardiac Enzymes: No results for input(s): CKTOTAL, CKMB, CKMBINDEX, TROPONINI in the last 168 hours. CBG:  Recent Labs Lab 12/05/15 1419 12/05/15 1530 12/05/15 1700 12/05/15 2139 12/06/15 0801  GLUCAP 228* 196* 198* 242* 220*   Iron Studies: No results for input(s): IRON, TIBC, TRANSFERRIN, FERRITIN in the last 72 hours. @lablastinr3 @ Studies/Results: No results found. Medications: . sodium chloride     . allopurinol  100 mg Oral Daily  . aspirin  325 mg Oral Daily  . calcium carbonate  2 tablet Oral TID WC  . [START ON 12/11/2015] darbepoetin (ARANESP) injection - DIALYSIS  150 mcg Intravenous Q Thu-HD  .  dialysis solution 2.5% low-MG/low-CA   Intraperitoneal Q6H  . feeding supplement (NEPRO CARB STEADY)  237 mL Oral BID BM  . gentamicin cream  1 application Topical QPM  . heparin  5,000 Units Subcutaneous Q8H  . insulin aspart  0-9 Units Subcutaneous TID WC  . insulin glargine  20 Units Subcutaneous Q breakfast  . levothyroxine  112 mcg Oral QAC breakfast  . loratadine  10 mg Oral Daily  . metoCLOPramide  10 mg Oral TID AC  . midodrine  10 mg Oral Q breakfast  . mirtazapine  15 mg Oral QHS  . multivitamin  1 tablet Oral QHS  . nitroGLYCERIN  0.2 mg Transdermal Daily  . pantoprazole  40 mg Oral Daily  . piperacillin-tazobactam (ZOSYN)  IV  2.25 g Intravenous Q8H  . polyethylene glycol  17 g Oral Daily  . sodium chloride flush  3 mL Intravenous Q12H       Assessment/Plan: 1 Gangrene R great toe / PVD/ Sp Dr. Bridgett Larsson successful intervention on his right peroneal artery and SFA. 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 12/05/15. On Zosyn/Vanc per primary. TMAX 97.9 today 2 ESRD on CAPD. OP EDW 96 kg. Having issues with wts. Has been adjusting dialysate according to BP. Re-weighed pt on bed with everything taken off-wt 101.2. Patient uses 1.5% 3000 cc x 2, 2.5% 3000 cc X 1 then dwells 1.5% 3000cc over night. Has only been getting 2500 cc exchanges here. K+ 3.7 3. Vol/hypotension: Last wt 101.21 Wt up 5.1 kg. Adjust CAPD back to home orders. No edema. BP controlled. Chron hypotension on midodrine 4 Anemia: HGB 9.2 ESA ordered yesterday-do not see that he has rec'd this. F/U to see if he rec'd dose. Follow HGB.  5. 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 6 Nutrition: Albumin 1.4-may be D/T infection. Carb mod diet. Add prostat/Renal vits/dietary consult for protein malnutrition.  7. DM per primary.  [name] NP-C 12/06/2015, 10:29 AM  [facility] [phone]  Pt seen, examined and agree w A/P as above. ESRD pt on PD, s/p R toe amp and perc revasc of the RLE, having wt/ fluid gain here so we will ^ PD to 2.5% fluid.  Stable otherwise, not symptomatic from extra volume and this can be managed at home as well as here.  [name] MD [employer] pager [phone]    cell [phone] 12/06/2015, 2:16 PM

## 2015-12-06 NOTE — Evaluation (Signed)
Physical Therapy Evaluation Patient Details Name: Jack Chung MRN: [id number] DOB: 1941/06/05 Today's Date: 12/06/2015   History of Present Illness  HPI: Jack Chung is a 75 y.o. male who was admitted for R foot infection and now has amputation of R first ray.  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   Will anticipate need for inpt rehab but family and pt were reluctant due to previous bad care experience in another facility.  He is appropriate to continue his care at [facility] and will work toward his return to home if pt refuses the appropriate transitional care level.  Follow for sitting balance and sliding board transfers acutely.    Follow Up Recommendations SNF    Equipment Recommendations  None recommended by PT    Recommendations for Other Services OT consult     Precautions / Restrictions Precautions Precautions: Fall Precaution Comments: has NWB on RLE Required Braces or Orthoses: Other Brace/Splint Other Brace/Splint: L BKA prosthesis Restrictions Weight Bearing Restrictions: Yes Other Position/Activity Restrictions: NWB R foot      Mobility  Bed Mobility Overal bed mobility: Needs Assistance Bed Mobility: Supine to Sit;Sit to Supine     Supine to sit: Min assist;Mod assist Sit to supine: Min assist;Mod assist   General bed mobility comments: used rails and cued sequence, HOB elevated slightly  Transfers Overall transfer level: Needs assistance Equipment used: Rolling walker (2 wheeled);2 person hand held assist Transfers: Sit to/from Stand Sit to Stand: +2 physical assistance;+2 safety/equipment;Max assist;From elevated surface         General transfer comment: cannot manage NWB on RLE with standing at  walker  Ambulation/Gait             General Gait Details: cannot attempt due to RLE being difficulty to keep NWB  Stairs            Wheelchair Mobility    Modified Rankin (Stroke Patients Only)       Balance Overall balance assessment: Needs assistance Sitting-balance support: Single extremity supported;Bilateral upper extremity supported Sitting balance-Leahy Scale: Poor                                       Pertinent Vitals/Pain Pain Assessment: No/denies pain    Home Living Family/patient expects to be discharged to:: Private residence Living Arrangements: Spouse/significant other Available Help at Discharge: Family;Available 24 hours/day Type of Home: Apartment Home Access: Ramped entrance     Home Layout: One level Home Equipment: Walker - 2 wheels;Shower seat;Electric scooter;Cane - single point;Bedside commode;Wheelchair - manual      Prior Function Level of Independence: Needs assistance   Gait / Transfers Assistance Needed: SPC with help for short trips to BR  ADL's / Homemaking Assistance Needed: wife assists with bathing/dressing  Comments: RW for ambulating     Hand Dominance   Dominant Hand: Right    Extremity/Trunk Assessment   Upper Extremity Assessment: Overall WFL for tasks assessed           Lower Extremity Assessment: Generalized weakness;RLE deficits/detail RLE Deficits / Details: has new R first ray amputation with NWB    Cervical / Trunk Assessment: Normal  Communication   Communication: No difficulties  Cognition Arousal/Alertness: Awake/alert Behavior During Therapy: WFL for tasks assessed/performed Overall Cognitive Status: Within Functional Limits for tasks assessed                      General Comments General comments (skin integrity, edema, etc.): Pt has dense wrap on R foot from surgery with L stump in good condition and no areas of skin breakdown    Exercises         Assessment/Plan    PT Assessment Patient needs continued PT services  PT Diagnosis Difficulty walking;Generalized weakness   PT Problem List Decreased strength;Decreased range of motion;Decreased activity tolerance;Decreased balance;Decreased mobility;Decreased coordination;Decreased knowledge of use of DME;Decreased safety awareness;Decreased knowledge of precautions;Cardiopulmonary status limiting activity;Obesity;Decreased skin integrity;Pain  PT Treatment Interventions DME instruction;Gait training;Functional mobility training;Therapeutic activities;Therapeutic exercise;Balance training;Patient/family education   PT Goals (Current goals can be found in the Care Plan section) Acute Rehab PT Goals Patient Stated Goal: to get home PT Goal Formulation: With patient/family Time For Goal Achievement: 12/20/15 Potential to Achieve Goals: Good    Frequency Min 3X/week   Barriers to discharge Decreased caregiver support (wife home alone with pt)      Co-evaluation               End of Session Equipment Utilized During Treatment: Gait belt Activity Tolerance: Patient tolerated treatment well Patient left: in bed;with call bell/phone within reach;with bed alarm set;with family/visitor present;with nursing/sitter in room Nurse Communication: Mobility status         Time: [id number] PT Time Calculation (min) (ACUTE ONLY): 31 min   Charges:   PT Evaluation $PT Eval Moderate Complexity: 1 Procedure PT Treatments $Therapeutic Activity: 8-22 mins   PT G Codes:        Ramond Dial 2016/01/03, 12:55 PM    Mee Hives, PT MS Acute Rehab Dept. Number: [facility]

## 2015-12-06 NOTE — Clinical Social Work Note (Signed)
CSW acknowledges consult for "Access Meds for Discharge." Will notify RNCM. CSW acknowledges consult for SNF. PT currently recommending HHPT pending post-op progress. Will follow progress if any change to recommendations.  Dayton Scrape, [place]

## 2015-12-06 NOTE — Progress Notes (Signed)
PROGRESS NOTE   [facility] Progress Note  Patient: Jack Chung:[id number]   PCP: [name], MD DOB: June 25, 1940   DOA: 12/01/2015  DOS: 7/12017   Date of Service: the patient was seen and examined on 12/06/2015      Subjective: Continues to deny any acute complaint. He reports a vague discomfort at the site of the amputation but otherwise no complaints. He believes that he is ready for discharge today. Nutrition: tolerating oral diet  Brief hospital course: Pt. with PMH of IDDM, diabetic neuropathy, ESRD on PD, HTN, hypothyroidism, CHF, CAD, CABG, hyperlipidemia, sarcoidosis, Left BKA, arthritis ; admitted on 12/01/2015, with complaint of worsening wound to right great toe, was found to have cellulitis of the right lower leg. Managed at wound care and referred to the hospital. Treated with Ciprofloxacin. 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 Started on Zosyn and Vancomycin. Vascular surgery consulted, ordered CTA of right leg. Status post angioplasty of the right leg. Orthopedic [name] consulted and the patient had a first ray amputation on 12/05/2015. Currently further plan is continue ABX and monitoring symptoms as well as discharge planning - home vs. SNF.  Assessment and Plan:  Cellulitis of right lower extremity Initially treated with vancomycin and Zosyn. We will transition to oral Keflex complete 7 day treatment course for cellulitis as per my discussion with ID. Status post PTA of right leg with right peroneal artery angioplasty as well as right superficial femoral artery angioplasty. Drug-coated angioplasty of right superficial femoral artery. ESR 127, CRP 11.8 Blood cultures no growth to date. After discussion with [name] the patient is agreeable for first ray amputation, performed on 12/05/2015. Monitor for postoperative recovery. Has post-op shoe, NWB, up with PT/OT.  Anemia Likely secondary to ESRD with minimal  expected post-op acute blood loss anemia. Asymptomatic and no signs of acute blood loss Monitor hematocrit and hemoglobin.  ESRD on peritoneal dialysis [facility]) Nephrology was consulted for PD orders.  Hypokalemia: Replaced and now stable.  Protein calorie malnutrition Continue nutritional supplementation.  Gout Continue allopurinol.  CAD (coronary artery disease) Stable at this time. Continue aspirin.  Type 2 diabetes mellitus with polyneuropathy (HCC) Carbohydratemodified/renal diet. Continue Lantus 20 units at breakfast and regular insulin sliding scale. Continue CBG monitoring  Hemoglobin A1c 7.0.  Hypothyroidism Continue levothyroxine.  Pain management: Tylenol Activity: physical therapy recommends home with home health, DME wheelchair ordered; will follow again today to see if this recommendation has changed following amputation Bowel regimen: last BM 6/27 MiraLAX added Diet: Renal and carb modified  DVT Prophylaxis: subcutaneous Heparin and mechanical compression device.  Advance goals of care discussion: Full code  Family Communication: none this AM  Disposition:  Discharge to home vs. SNF for rehab. Expected discharge date: 12/07/2015, pending postoperative recovery as well as PTOT consult.  Consultants: Vascular surgery, nephrology, orthopedic surgery Procedures: Angioplasty of the right superficial femoral as well as right peroneal artery. Right first ray amputation for dry gangrene of right great toe  Antibiotics: Anti-infectives    Start   Dose/Rate Route Frequency Ordered Stop   12/06/15 0800  vancomycin (VANCOCIN) 1,500 mg in sodium chloride 0.9 % 500 mL IVPB    1,500 mg 250 mL/hr over 120 Minutes Intravenous Once 12/05/15 1130    12/02/15 1400  piperacillin-tazobactam (ZOSYN) IVPB 2.25 g    2.25 g 100 mL/hr over 30 Minutes Intravenous Every 8 hours 12/02/15 1038    12/02/15 1000  fluconazole (DIFLUCAN)  tablet 100 mg  100 mg Oral Daily 12/01/15 2106 12/03/15 1215   12/02/15 0700  piperacillin-tazobactam (ZOSYN) IVPB 2.25 g Status: Discontinued    2.25 g 100 mL/hr over 30 Minutes Intravenous Every 12 hours 12/01/15 2141 12/02/15 1038   12/01/15 2300  vancomycin (VANCOCIN) 2,000 mg in sodium chloride 0.9 % 500 mL IVPB    2,000 mg 250 mL/hr over 120 Minutes Intravenous Once 12/01/15 2141 12/02/15 0012   12/01/15 1900  piperacillin-tazobactam (ZOSYN) IVPB 3.375 g    3.375 g 100 mL/hr over 30 Minutes Intravenous Once 12/01/15 1857 12/01/15 1953        Intake/Output Summary (Last 24 hours) at 12/05/15 1646 Last data filed at 12/05/15 1520  Gross per 24 hour  Intake 10010 ml  Output  6130 ml  Net  3880 ml   Filed Weights   12/02/15 1900 12/03/15 2143 12/04/15 2122  Weight: 101.334 kg (223 lb 6.4 oz) 101.969 kg (224 lb 12.8 oz) 102.1 kg (225 lb 1.4 oz)         Objective: Filed Vitals:   12/05/15 1900 12/05/15 2142 12/06/15 0552 12/06/15 0854  BP: 112/50 101/44 122/55 142/58  Pulse: 74 66 72 79  Temp: 97.5 F (36.4 C) 97.7 F (36.5 C) 98.1 F (36.7 C) 97.9 F (36.6 C)  TempSrc: Oral Oral Oral Oral  Resp: _0 Height:      Weight:  104.327 kg (230 lb)    SpO2: 99% 99% 90% 98%    Intake/Output Summary (Last 24 hours) at 12/06/15 1142 Last data filed at 12/06/15 0900  Gross per 24 hour  Intake  12680 ml  Output   9830 ml  Net   2850 ml   Filed Weights   12/03/15 2143 12/04/15 2122 12/05/15 2142  Weight: 101.969 kg (224 lb 12.8 oz) 102.1 kg (225 lb 1.4 oz) 104.327 kg (230 lb)    Examination:  General exam: Appears calm and comfortable  Respiratory system: Clear to auscultation. Respiratory effort normal. Cardiovascular system: S1 & S2 heard, RRR. No JVD, murmurs, rubs, gallops or clicks. No pedal edema. Gastrointestinal system: Abdomen is nondistended, soft and nontender. No organomegaly  or masses felt. Normal bowel sounds heard. Central nervous system: Alert and oriented. No focal neurological deficits. Extremities: Symmetric 5 x 5 power. Skin: No rashes, lesions or ulcers Psychiatry: Judgement and insight appear normal. Mood & affect appropriate.     Data Reviewed: I have personally reviewed following labs and imaging studies  CBC:  Recent Labs Lab 12/01/15 1237 12/02/15 0655 12/03/15 0540 12/04/15 0404 12/04/15 1514 12/05/15 0758 12/06/15 0533  WBC 12.3* 10.2 8.2 8.4 10.5 8.4 7.9  NEUTROABS 9.0* 6.5 5.4 5.4  --  5.4  --   HGB 11.0* 8.1* 9.0* 8.9* 10.0* 9.2* 9.4*  HCT 34.8* 25.9* 29.0* 28.8* 32.4* 29.7* 30.4*  MCV 95.6 93.2 93.5 95.4 96.1 95.8 98.1  PLT 414* 439* 358 404* 393 390 837   Basic Metabolic Panel:  Recent Labs Lab 12/03/15 0540 12/03/15 0550 12/04/15 0404 12/04/15 1514 12/05/15 0758 12/05/15 0944 12/06/15 0533  NA 132* 131* 132*  131*  131*  --  132* 131* 132*  K 3.4* 3.4* 3.6  3.5  3.6  --  3.7 3.6 3.7  CL 95* 95* 94*  94*  94*  --  95* 94* 94*  CO2 _1 --  _2 GLUCOSE 240* 239* 269*  267*  276*  --  197* 172* 232*  BUN 42* 42* 41*  41*  40*  --  44* 45* 44*  CREATININE 6.41* 6.23* 5.82*  5.77*  5.96* 6.26* 6.46* 6.61* 6.23*  CALCIUM 7.5* 7.4* 7.5*  7.5*  7.4*  --  7.6* 7.7* 7.8*  MG 1.4*  --   --   --  1.6*  --   --   PHOS  --  4.0 3.9  --  4.5 4.5 5.4*   GFR: Estimated Creatinine Clearance: 13 mL/min (by C-G formula based on Cr of 6.23). Liver Function Tests:  Recent Labs Lab 12/02/15 0655 12/03/15 0540 12/03/15 0550 12/04/15 0404 12/05/15 0758 12/05/15 0944 12/06/15 0533  AST 29 24  --  20 18  --   --   ALT 14* 14*  --  12* 13*  --   --   ALKPHOS 94 96  --  97 101  --   --   BILITOT 0.7 0.4  --  0.3 0.4  --   --   PROT 5.6* 5.3*  --  5.4* 5.4*  --   --   ALBUMIN 1.7* 1.6* 1.6* 1.5*  1.5* 1.4* 1.5* 1.4*   No results for input(s): LIPASE, AMYLASE in the last 168 hours. No  results for input(s): AMMONIA in the last 168 hours. Coagulation Profile:  Recent Labs Lab 12/04/15 0404 12/05/15 0758  INR 1.12 1.20   Cardiac Enzymes: No results for input(s): CKTOTAL, CKMB, CKMBINDEX, TROPONINI in the last 168 hours. BNP (last 3 results) No results for input(s): PROBNP in the last 8760 hours. HbA1C: No results for input(s): HGBA1C in the last 72 hours. CBG:  Recent Labs Lab 12/05/15 1419 12/05/15 1530 12/05/15 1700 12/05/15 2139 12/06/15 0801  GLUCAP 228* 196* 198* 242* 220*   Lipid Profile: No results for input(s): CHOL, HDL, LDLCALC, TRIG, CHOLHDL, LDLDIRECT in the last 72 hours. Thyroid Function Tests: No results for input(s): TSH, T4TOTAL, FREET4, T3FREE, THYROIDAB in the last 72 hours. Anemia Panel: No results for input(s): VITAMINB12, FOLATE, FERRITIN, TIBC, IRON, RETICCTPCT in the last 72 hours. Urine analysis:    Component Value Date/Time   COLORURINE RED* 05/20/2015 2211   APPEARANCEUR CLOUDY* 05/20/2015 2211   LABSPEC 1.022 05/20/2015 2211   PHURINE 5.0 05/20/2015 2211   GLUCOSEU 250* 05/20/2015 2211   HGBUR MODERATE* 05/20/2015 2211   BILIRUBINUR MODERATE* 05/20/2015 2211   KETONESUR 15* 05/20/2015 2211   PROTEINUR 30* 05/20/2015 2211   UROBILINOGEN 0.2 07/17/2013 0334   NITRITE NEGATIVE 05/20/2015 2211   LEUKOCYTESUR MODERATE* 05/20/2015 2211   Sepsis Labs: _0 (procalcitonin:4,lacticidven:4)  ) Recent Results (from the past 240 hour(s))  Blood Cultures x 2 sites     Status: None (Preliminary result)   Collection Time: 12/01/15  7:17 PM  Result Value Ref Range Status   Specimen Description BLOOD LEFT UPPER ARM  Final   Special Requests IN PEDIATRIC BOTTLE 1.25CC  Final   Culture NO GROWTH 4 DAYS  Final   Report Status PENDING  Incomplete  Blood Cultures x 2 sites     Status: None (Preliminary result)   Collection Time: 12/01/15  7:56 PM  Result Value Ref Range Status   Specimen Description BLOOD LEFT ANTECUBITAL   Final   Special Requests IN PEDIATRIC BOTTLE 3CC  Final   Culture NO GROWTH 4 DAYS  Final   Report Status PENDING  Incomplete  Surgical pcr screen     Status: None   Collection Time: 12/05/15  9:00 AM  Result Value Ref Range Status  MRSA, PCR NEGATIVE NEGATIVE Final   Staphylococcus aureus NEGATIVE NEGATIVE Final    Comment:        The Xpert SA Assay (FDA approved for NASAL specimens in patients over 46 years of age), is one component of a comprehensive surveillance program.  Test performance has been validated by [facility] for patients greater than or equal to 36 year old. It is not intended to diagnose infection nor to guide or monitor treatment.          Radiology Studies: No results found.      Scheduled Meds: . allopurinol  100 mg Oral Daily  . aspirin  325 mg Oral Daily  . calcium carbonate  2 tablet Oral TID WC  . [START ON 12/11/2015] darbepoetin (ARANESP) injection - DIALYSIS  150 mcg Intravenous Q Thu-HD  . dialysis solution 2.5% low-MG/low-CA   Intraperitoneal Q6H  . feeding supplement (NEPRO CARB STEADY)  237 mL Oral BID BM  . gentamicin cream  1 application Topical QPM  . heparin  5,000 Units Subcutaneous Q8H  . insulin aspart  0-9 Units Subcutaneous TID WC  . insulin glargine  20 Units Subcutaneous Q breakfast  . levothyroxine  112 mcg Oral QAC breakfast  . loratadine  10 mg Oral Daily  . metoCLOPramide  10 mg Oral TID AC  . midodrine  10 mg Oral Q breakfast  . mirtazapine  15 mg Oral QHS  . multivitamin  1 tablet Oral QHS  . nitroGLYCERIN  0.2 mg Transdermal Daily  . pantoprazole  40 mg Oral Daily  . piperacillin-tazobactam (ZOSYN)  IV  2.25 g Intravenous Q8H  . polyethylene glycol  17 g Oral Daily  . sodium chloride flush  3 mL Intravenous Q12H   Continuous Infusions: . sodium chloride       LOS: 5 days    Time spent: 35 minutes    Karmen Bongo, MD [facility]   If 7PM-7AM, please contact  night-coverage [email or web address] Password [email or web address] 12/06/2015, 11:42 AM

## 2015-12-06 NOTE — Clinical Social Work Note (Signed)
CSW met with patient. Wife at bedside. Discussed SNF recommendation. Both patient and his wife said that was not an option and they wanted HHPT. RNCM notified.  CSW signing off. Consult if any other social work needs arise.  Dayton Scrape, [place]

## 2015-12-07 ENCOUNTER — Encounter (HOSPITAL_COMMUNITY): Payer: Medicare Other

## 2015-12-07 LAB — CBC WITH DIFFERENTIAL/PLATELET
BASOS ABS: 0.1 10*3/uL (ref 0.0–0.1)
BASOS PCT: 1 %
EOS ABS: 0.7 10*3/uL (ref 0.0–0.7)
EOS PCT: 9 %
HCT: 28.6 % — ABNORMAL LOW (ref 39.0–52.0)
Hemoglobin: 9 g/dL — ABNORMAL LOW (ref 13.0–17.0)
Lymphocytes Relative: 19 %
Lymphs Abs: 1.5 10*3/uL (ref 0.7–4.0)
MCH: 30.4 pg (ref 26.0–34.0)
MCHC: 31.5 g/dL (ref 30.0–36.0)
MCV: 96.6 fL (ref 78.0–100.0)
MONO ABS: 0.7 10*3/uL (ref 0.1–1.0)
Monocytes Relative: 9 %
Neutro Abs: 4.9 10*3/uL (ref 1.7–7.7)
Neutrophils Relative %: 62 %
PLATELETS: 404 10*3/uL — AB (ref 150–400)
RBC: 2.96 MIL/uL — ABNORMAL LOW (ref 4.22–5.81)
RDW: 14.2 % (ref 11.5–15.5)
WBC: 7.9 10*3/uL (ref 4.0–10.5)

## 2015-12-07 LAB — RENAL FUNCTION PANEL
ALBUMIN: 1.4 g/dL — AB (ref 3.5–5.0)
ANION GAP: 12 (ref 5–15)
BUN: 46 mg/dL — AB (ref 6–20)
CALCIUM: 7.8 mg/dL — AB (ref 8.9–10.3)
CO2: 26 mmol/L (ref 22–32)
CREATININE: 6.44 mg/dL — AB (ref 0.61–1.24)
Chloride: 93 mmol/L — ABNORMAL LOW (ref 101–111)
GFR calc Af Amer: 9 mL/min — ABNORMAL LOW (ref >60)
GFR calc non Af Amer: 8 mL/min — ABNORMAL LOW (ref >60)
GLUCOSE: 338 mg/dL — AB (ref 65–99)
PHOSPHORUS: 5.3 mg/dL — AB (ref 2.5–4.6)
Potassium: 3.5 mmol/L (ref 3.5–5.1)
SODIUM: 131 mmol/L — AB (ref 135–145)

## 2015-12-07 LAB — GLUCOSE, CAPILLARY
GLUCOSE-CAPILLARY: 258 mg/dL — AB (ref 65–99)
GLUCOSE-CAPILLARY: 303 mg/dL — AB (ref 65–99)

## 2015-12-07 MED ORDER — OXYCODONE-ACETAMINOPHEN 5-325 MG PO TABS: 1.0000 | ORAL_TABLET | Freq: Four times a day (QID) | ORAL | Status: AC | PRN

## 2015-12-07 MED ORDER — CEPHALEXIN 250 MG PO CAPS
250.0000 mg | ORAL_CAPSULE | Freq: Two times a day (BID) | ORAL | Status: DC
  Administered 2015-12-07: 250 mg via ORAL
  Filled 2015-12-07 (×2): qty 1

## 2015-12-07 MED ORDER — PRO-STAT SUGAR FREE PO LIQD: 30.0000 mL | Freq: Two times a day (BID) | ORAL | Status: AC

## 2015-12-07 MED ORDER — POLYETHYLENE GLYCOL 3350 17 G PO PACK: 17.0000 g | PACK | Freq: Every day | ORAL | Status: AC | PRN

## 2015-12-07 MED ORDER — CEPHALEXIN 250 MG PO CAPS: 250.0000 mg | ORAL_CAPSULE | Freq: Two times a day (BID) | ORAL | Status: AC

## 2015-12-07 MED ORDER — MIDODRINE HCL 10 MG PO TABS: 10.0000 mg | ORAL_TABLET | Freq: Every morning | ORAL | Status: AC

## 2015-12-07 NOTE — Discharge Instructions (Signed)
The following problems have been addressed during this hospitalization:  1. Cellulitis of right leg and amputation of R first ray - -Complete 7 day treatment course of Keflex (antibiotic) -Angioplasty performed; follow up with vascular surgery as scheduled -Amputation performed; follow up with orthopedics as scheduled -Wear post-operative shoe and be non-weight bearing until instructed otherwise  2. Rehab - Inpatient rehab at a skilled nursing facility was encouraged but you have declined this option and prefer to return home. -Home health will be in touch with you regarding rehab services including home health nursing, physical therapy, and occupational therapy.  3. End-stage renal disease, on peritoneal dialysis -Continue dialysis as discussed with the nephrologist and follow up with nephrology as instructed  4. Nutrition  -Continue Nepro daily -Add Prostat 30 mL twice daily to help with wound healing

## 2015-12-07 NOTE — Care Management Note (Signed)
Case Management Note  Patient Details  Name: Jack Chung MRN: [id number] Date of Birth: 1940/11/29  Subjective/Objective:        s/p toe amputation   ESRD on PD, left BKA, hypertension, hypothyroidism, CHF, CAD, CABG, arthritis, sarcoidosis.  In patient Rehab was recommendation but was not approved by insurance, SNF was then recommended but patient and wife declined and opted to go home with [facility] services. Verified contact information with patient, No DME needs patient states he has w/c, bsc, r/w.  Discussed HH and offered choice selected [facility]. Referral called to [place] [facility]iaison [facility] to confirm [facility] services.  Explained to patient and spouse that RN from [facility] will contact them at home 24 -48 hours   Action/Plan:   Expected Discharge Date:  12/05/15   12/07/2015            Expected Discharge Plan:  [place]  In-House Referral:     Discharge planning Services  CM Consult  Post Acute Care Choice:  Home Health Choice offered to:  Patient  DME Arranged:    DME Agency:     HH Arranged:  RN, PT, OT, Nurse's Aide [place] Agency:  Other - See comment, [place] ([facility])  Status of Service:  Completed, signed off  If discussed at [place] of Stay Meetings, dates discussed:    Additional CommentsLaurena Slimmer, RN 12/07/2015, 1:16 PM

## 2015-12-07 NOTE — Progress Notes (Signed)
Jack Chung Progress Note    Subjective:  Going home today  Objective Filed Vitals:   12/06/15 1734 12/06/15 2059 12/07/15 0529 12/07/15 0757  BP: 128/56 125/55 111/47 119/53  Pulse: 84 81 73 78  Temp: 98.4 F (36.9 C) 98.3 F (36.8 C) 97.4 F (36.3 C) 98 F (36.7 C)  TempSrc: Oral Oral Oral Oral  Resp: 18 18 18 18   Height:      Weight:  102.9 kg (226 lb 13.7 oz)    SpO2: 99% 99% 95% 97%   Physical Exam General: Obese, NAD Heart: HD distant, S1,S2, RRR II/VI systolic M Lungs: Bilateral breath sounds dec in bases CTA A/P Abdomen: abdomen soft, nontender. PD catheter LUQ drsg intact-site unremarkable. Extremities: L BKA. SCDs LE no edema. R great toe gangrene Dialysis Access: RUA AVF + bruit  Dialysis: CAPD edw 96kg 4 exchanges of 3000 during the day with 4h dwell, overnight 2500 x 8 hrs Patient's wife states they have been doing 4 exchanges-Patient uses 1.5% 3000 cc x 2, 2.5% 3000 cc X 1 then dwells 1.5% 3000cc over night.    Assessment/Plan: 1. 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 5.Nutrition: Albumin 1.4-may be D/T infection. Carb mod diet. Add prostat/Renal vits/dietary consult for protein malnutrition.  6. DM per primary 7. Dispo - for [employer] home today  [name] MD [facility] pager [phone]    cell [phone] 12/07/2015, 1:23 PM  Additional Objective Labs: Basic Metabolic Panel:  Recent Labs Lab 12/05/15 0944 12/06/15 0533 12/07/15 0900  NA 131* 132* 131*  K 3.6 3.7 3.5  CL 94* 94* 93*  CO2 25 24 26   GLUCOSE 172* 232* 338*  BUN 45* 44* 46*  CREATININE 6.61* 6.23* 6.44*  CALCIUM 7.7* 7.8* 7.8*  PHOS 4.5 5.4* 5.3*    Liver Function Tests:  Recent Labs Lab 12/03/15 0540  12/04/15 0404 12/05/15 0758 12/05/15 0944 12/06/15 0533 12/07/15 0900  AST 24  --  20 18  --   --   --   ALT 14*  --  12* 13*  --   --   --   ALKPHOS 96  --  97 101  --   --   --   BILITOT 0.4  --  0.3 0.4  --   --   --   PROT 5.3*  --  5.4* 5.4*  --   --   --   ALBUMIN 1.6*  < > 1.5*  1.5* 1.4* 1.5* 1.4* 1.4*  < > = values in this interval not displayed. No results for input(s): LIPASE, AMYLASE in the last 168 hours. CBC:  Recent Labs Lab 12/04/15 0404 12/04/15 1514 12/05/15 0758 12/06/15 0533 12/07/15 0900  WBC 8.4 10.5 8.4 7.9 7.9  NEUTROABS 5.4  --  5.4  --  4.9  HGB 8.9* 10.0* 9.2* 9.4* 9.0*  HCT 28.8* 32.4* 29.7* 30.4* 28.6*  MCV 95.4 96.1 95.8 98.1 96.6  PLT 404* 393 390 380 404*   Blood Culture    Component Value Date/Time   SDES BLOOD LEFT ANTECUBITAL 12/01/2015 1956   SPECREQUEST IN PEDIATRIC BOTTLE [facility] 12/01/2015  1956   CULT NO GROWTH 5 DAYS 12/01/2015 1956   REPTSTATUS 12/06/2015 FINAL 12/01/2015 1956    Cardiac Enzymes: No results for input(s): CKTOTAL, CKMB, CKMBINDEX, TROPONINI in the last 168 hours. CBG:  Recent Labs Lab 12/06/15 1226 12/06/15 1732 12/06/15 2057 12/07/15 0756 12/07/15 1210  GLUCAP 293* 243* 256* 258* 303*   Iron Studies: No results for input(s): IRON, TIBC, TRANSFERRIN, FERRITIN in the last 72 hours. @lablastinr3 @ Studies/Results: No results found. Medications: . sodium chloride     . allopurinol  100 mg Oral Daily  . aspirin  325 mg Oral Daily  . calcium carbonate  2 tablet Oral TID WC  . [START ON 12/11/2015] darbepoetin (ARANESP) injection - DIALYSIS  150 mcg Intravenous Q Thu-HD  . dialysis solution 2.5% low-MG/low-CA   Intraperitoneal Q6H  . feeding supplement (NEPRO CARB STEADY)  237 mL Oral BID BM  . feeding supplement (PRO-STAT SUGAR FREE 64)  30 mL Oral BID  . gentamicin cream  1 application Topical QPM  . heparin  5,000 Units Subcutaneous Q8H  .  insulin aspart  0-9 Units Subcutaneous TID WC  . insulin glargine  20 Units Subcutaneous Q breakfast  . levothyroxine  112 mcg Oral QAC breakfast  . loratadine  10 mg Oral Daily  . metoCLOPramide  10 mg Oral TID AC  . midodrine  10 mg Oral Q breakfast  . mirtazapine  15 mg Oral QHS  . multivitamin  1 tablet Oral QHS  . nitroGLYCERIN  0.2 mg Transdermal Daily  . pantoprazole  40 mg Oral Daily  . piperacillin-tazobactam (ZOSYN)  IV  2.25 g Intravenous Q8H  . polyethylene glycol  17 g Oral Daily  . sodium chloride flush  3 mL Intravenous Q12H

## 2015-12-07 NOTE — Progress Notes (Signed)
Patient's wife refused the exit care for PD cath as she will be doing by herself after she gives bath tonight, patient has order to be discharged.

## 2015-12-07 NOTE — Progress Notes (Signed)
Patient Discharge: Disposition: Patient discharged to home. Education: Reviewed medications, prescriptions, follow-up appointments and discharge instructions, understood and acknowledged. IV: Discontinued IV before discharge. Telemetry: N/A Transportation: Patient transported in w/c out of the unit accompanied by the staff. Belongings: Patient took all his belongings with him.

## 2015-12-07 NOTE — Discharge Summary (Signed)
Physician Discharge Summary  Jack Chung GTX:[id number] DOB: 28-Aug-1940 DOA: 12/01/2015  PCP: [name], MD  Admit date: 12/01/2015 Discharge date: 12/07/2015  Time spent: 45 minutes  Recommendations for Outpatient Follow-up:  Patient will be discharged to home (patient declined SNF placement despite clear recommendation for this level of care).  Patient will need to follow up with primary care provider within one week of discharge.  Patient should continue medications as prescribed.  Patient should follow a heart healthy diabetic diet.    Discharge Diagnoses:  Principal Problem:   Cellulitis of leg, right Active Problems:   Anemia   Gout   CAD (coronary artery disease)   Type 2 diabetes mellitus with polyneuropathy ([place])   ESRD on peritoneal dialysis [facility])   Hypothyroidism   Dry gangrene [facility])   Discharge Condition: Improved  Diet recommendation: Heart healthy diabetic diet  Filed Weights   12/05/15 2142 12/06/15 1455 12/06/15 2059  Weight: 104.327 kg (230 lb) 96.4 kg (212 lb 8.4 oz) 102.9 kg (226 lb 13.7 oz)    History of present illness:  Pt. with PMH of IDDM, diabetic neuropathy, ESRD on PD, HTN, hypothyroidism, CHF, CAD, CABG, hyperlipidemia, sarcoidosis, Left BKA, arthritis ; admitted on 12/01/2015, with complaint of worsening wound to right great toe, was found to have cellulitis of the right lower leg. Managed at wound care and referred to the hospital. Treated with Ciprofloxacin. 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 Started on Zosyn and Vancomycin. Vascular surgery consulted, ordered CTA of right leg. Status post angioplasty of the right leg. Orthopedic [name] consulted and the patient had a first ray amputation on 12/05/2015. Currently further plan is continue ABX and monitoring symptoms as well as discharge planning - home vs. SNF.  Hospital Course:  Cellulitis of right lower extremity Initially treated with vancomycin and Zosyn.  Transitioned to oral Keflex to complete 7 day treatment course for cellulitis as per discussion with ID. Status post PTA of right leg with right peroneal artery angioplasty as well as right superficial femoral artery angioplasty. Drug-coated angioplasty of right superficial femoral artery. ESR 127, CRP 11.8 Blood cultures no growth to date. After discussion with [name] the patient is agreeable for first ray amputation, performed on 12/05/2015. Uncomplicated postoperative recovery. Has post-op shoe, NWB, up with PT/OT. Patient was recommended to go to SNF for rehab and he and his wife declined this recommendation and request HHPT instead.  Anemia Likely secondary to ESRD with minimal expected post-op acute blood loss anemia. Asymptomatic and no signs of acute blood loss Stable hematocrit and hemoglobin.  ESRD on peritoneal dialysis [facility]) Nephrology was consulted for PD orders and have made recommendations for home PD.  Hypokalemia: Replaced and now stable.  Protein calorie malnutrition Continue nutritional supplementation with Nepro daily and add 30 mL Prostat BID for wound healing  Gout Continue allopurinol.  CAD (coronary artery disease) Stable at this time. Continue aspirin.  Type 2 diabetes mellitus with polyneuropathy ([facility]) Carbohydratemodified/renal diet. Continue Lantus 20 units at breakfast and regular insulin sliding scale. Continue CBG monitoring  Hemoglobin A1c 7.0.  Hypothyroidism Continue levothyroxine.  Procedures: Angioplasty of the right superficial femoral as well as right peroneal artery. Right first ray amputation for dry gangrene of right great toe  Consultations:  Vascular surgery  Orthopedic surgery  Nephrology  Discharge Exam: Filed Vitals:   12/07/15 0529 12/07/15 0757  BP: 111/47 119/53  Pulse: 73 78  Temp: 97.4 F (36.3 C) 98 F (36.7 C)  Resp: 18  18     General: Well developed, well nourished, NAD, appears stated age, somewhat  cantakerous  HEENT: NCAT, PERRLA, EOMI, Anicteic Sclera, mucous membranes moist.  Neck: Supple, no JVD, no masses  Cardiovascular: S1 S2 auscultated, no rubs, murmurs or gallops. Regular rate and rhythm.  Respiratory: Clear to auscultation bilaterally with equal chest rise  Abdomen: Soft, nontender, nondistended, + bowel sounds  Extremities: warm dry without cyanosis clubbing or edema; s/p L leg amputation (remote) and now s/p R first ray amputation - bandage is C/D/I  Neuro: AAOx3, cranial nerves grossly intact. Strength 5/5 in patient's upper and lower extremities bilaterally  Skin: Without rashes exudates or nodules  Psych: Normal affect and demeanor with intact judgement and insight  Discharge Instructions The following problems have been addressed during this hospitalization:  1. Cellulitis of right leg and amputation of R first ray - -Complete 7 day treatment course of Keflex (antibiotic) -Angioplasty performed; follow up with vascular surgery as scheduled -Amputation performed; follow up with orthopedics as scheduled -Wear post-operative shoe and be non-weight bearing until instructed otherwise  2. Rehab - Inpatient rehab at a skilled nursing facility was encouraged but you have declined this option and prefer to return home. -Home health will be in touch with you regarding rehab services including home health nursing, physical therapy, and occupational therapy.  3. End-stage renal disease, on peritoneal dialysis -Continue dialysis as discussed with the nephrologist and follow up with nephrology as instructed  4. Nutrition  -Continue Nepro daily -Add Prostat 30 mL twice daily to help with wound healing    Medication List    ASK your doctor about these medications        ACIDOPHILUS PROBIOTIC PO  Take 1 tablet by mouth daily.     allopurinol 100 MG tablet  Commonly known as:  ZYLOPRIM  Take 100 mg by mouth daily.     aspirin 325 MG EC tablet  Take 325 mg by  mouth daily.     calcium carbonate 500 MG chewable tablet  Commonly known as:  TUMS - dosed in mg elemental calcium  Chew 2 tablets by mouth 3 (three) times daily with meals.     cetirizine 10 MG tablet  Commonly known as:  ZYRTEC  Take 10 mg by mouth daily as needed for allergies.     ciprofloxacin 500 MG tablet  Commonly known as:  CIPRO  Take 500 mg by mouth 2 (two) times daily.     feeding supplement (NEPRO CARB STEADY) Liqd  Drink 1 can by mouth once daily     fluconazole 100 MG tablet  Commonly known as:  DIFLUCAN  Take 100 mg by mouth daily. Start date 11/24/15 to last 7 days     gentamicin cream 0.1 %  Commonly known as:  GARAMYCIN  Apply 1 application topically daily. Applies to stomach.     insulin aspart 100 UNIT/ML injection  Commonly known as:  novoLOG  Inject 10 Units into the skin 3 (three) times daily with meals. To give 10 units PLUS SSI -Take none if blood sugar < 150, 151-200 =  2 units, 201-250 = 4 units, 251-300 = 6 units, 301-350 = 8 units     lactulose 10 GM/15ML solution  Commonly known as:  CHRONULAC  Take 10 g by mouth every 4 (four) hours as needed for mild constipation.     LANTUS SOLOSTAR 100 UNIT/ML Solostar Pen  Generic drug:  Insulin Glargine  Inject 20 Units into the skin  daily with breakfast.     levothyroxine 112 MCG tablet  Commonly known as:  SYNTHROID, LEVOTHROID  LABS/APPOINTMENT OVERDUE, 1 by mouth daily in the morning 30 minutes before breakfast for Thyroid     loperamide 2 MG capsule  Commonly known as:  IMODIUM  Take 2 mg by mouth daily as needed for diarrhea or loose stools.     metoCLOPramide 5 MG tablet  Commonly known as:  REGLAN  Take 2 tablets (10 mg total) by mouth 3 (three) times daily before meals.     midodrine 10 MG tablet  Commonly known as:  PROAMATINE  Take 1 tablet (10 mg total) by mouth 3 (three) times daily.     mirtazapine 15 MG tablet  Commonly known as:  REMERON  Take 15 mg by mouth at bedtime.      multivitamin Tabs tablet  Take 1 tablet by mouth daily.     nitroGLYCERIN 0.2 mg/hr patch  Commonly known as:  NITRODUR - Dosed in mg/24 hr  Place patch on the top of right foot every 24 hours, remove the previous patch.     omeprazole 10 MG capsule  Commonly known as:  PRILOSEC  Take 10 mg by mouth daily.     OVER THE COUNTER MEDICATION  LiquiCell: Mix in orange juice and drink daily (afternoon or evening)     potassium chloride SA 20 MEQ tablet  Commonly known as:  K-DUR,KLOR-CON  Take 20 mEq by mouth daily.     predniSONE 20 MG tablet  Commonly known as:  DELTASONE  Take 20 mg by mouth daily as needed (for joint pain from R.A. and O.A.).     UNABLE TO FIND  Peritoneal dialysis: Every 4 waking hours (0800, 1200, 1600, 2000)       Allergies  Allergen Reactions  . Flu Virus Vaccine Other (See Comments)    Gets the flu, patient has bad reaction to the flu vaccine (gets the flu)  . Lipitor [Atorvastatin] Other (See Comments)    Leg cramps and liver enzymes go "out of whack"  . Other Diarrhea and Nausea And Vomiting    NO TREE NUTS!!  . Peanut-Containing Drug Products Nausea And Vomiting       Follow-up Information    Follow up with Adele Barthel, MD In 4 weeks.   Specialties:  Vascular Surgery, Cardiology   Why:  Our office will call you to arrange an appointment   Contact information:   [place] [id number] [phone]       Follow up with DUDA,MARCUS V, MD In 1 week.   Specialty:  Orthopedic Surgery   Contact information:   [place] [address] [phone]       Follow up with [facility].   Why:  [place], PT, OT, HHA,   Contact information:   [address] [place] [address] [phone]        The results of significant diagnostics from this hospitalization (including imaging, microbiology, ancillary and laboratory) are listed below for reference.    Significant  Diagnostic Studies: No results found.  Microbiology: Recent Results (from the past 240 hour(s))  Blood Cultures x 2 sites     Status: None   Collection Time: 12/01/15  7:17 PM  Result Value Ref Range Status   Specimen Description BLOOD LEFT UPPER ARM  Final   Special Requests IN PEDIATRIC BOTTLE 1.25CC  Final   Culture NO GROWTH  5 DAYS  Final   Report Status 12/06/2015 FINAL  Final  Blood Cultures x 2 sites     Status: None   Collection Time: 12/01/15  7:56 PM  Result Value Ref Range Status   Specimen Description BLOOD LEFT ANTECUBITAL  Final   Special Requests IN PEDIATRIC BOTTLE 3CC  Final   Culture NO GROWTH 5 DAYS  Final   Report Status 12/06/2015 FINAL  Final  Surgical pcr screen     Status: None   Collection Time: 12/05/15  9:00 AM  Result Value Ref Range Status   MRSA, PCR NEGATIVE NEGATIVE Final   Staphylococcus aureus NEGATIVE NEGATIVE Final    Comment:        The Xpert SA Assay (FDA approved for NASAL specimens in patients over 60 years of age), is one component of a comprehensive surveillance program.  Test performance has been validated by [facility] for patients greater than or equal to 68 year old. It is not intended to diagnose infection nor to guide or monitor treatment.      Labs: Basic Metabolic Panel:  Recent Labs Lab 12/03/15 0540  12/04/15 0404 12/04/15 1514 12/05/15 0758 12/05/15 0944 12/06/15 0533 12/07/15 0900  NA 132*  < > 132*  131*  131*  --  132* 131* 132* 131*  K 3.4*  < > 3.6  3.5  3.6  --  3.7 3.6 3.7 3.5  CL 95*  < > 94*  94*  94*  --  95* 94* 94* 93*  CO2 27  < > '27  27  26  ' --  '26 25 24 26  ' GLUCOSE 240*  < > 269*  267*  276*  --  197* 172* 232* 338*  BUN 42*  < > 41*  41*  40*  --  44* 45* 44* 46*  CREATININE 6.41*  < > 5.82*  5.77*  5.96* 6.26* 6.46* 6.61* 6.23* 6.44*  CALCIUM 7.5*  < > 7.5*  7.5*  7.4*  --  7.6* 7.7* 7.8* 7.8*  MG 1.4*  --   --   --  1.6*  --   --   --   PHOS  --   < > 3.9  --  4.5 4.5  5.4* 5.3*  < > = values in this interval not displayed. Liver Function Tests:  Recent Labs Lab 12/02/15 0655 12/03/15 0540  12/04/15 0404 12/05/15 0758 12/05/15 0944 12/06/15 0533 12/07/15 0900  AST 29 24  --  20 18  --   --   --   ALT 14* 14*  --  12* 13*  --   --   --   ALKPHOS 94 96  --  97 101  --   --   --   BILITOT 0.7 0.4  --  0.3 0.4  --   --   --   PROT 5.6* 5.3*  --  5.4* 5.4*  --   --   --   ALBUMIN 1.7* 1.6*  < > 1.5*  1.5* 1.4* 1.5* 1.4* 1.4*  < > = values in this interval not displayed. No results for input(s): LIPASE, AMYLASE in the last 168 hours. No results for input(s): AMMONIA in the last 168 hours. CBC:  Recent Labs Lab 12/02/15 0655 12/03/15 0540 12/04/15 0404 12/04/15 1514 12/05/15 0758 12/06/15 0533 12/07/15 0900  WBC 10.2 8.2 8.4 10.5 8.4 7.9 7.9  NEUTROABS 6.5 5.4 5.4  --  5.4  --  4.9  HGB 8.1* 9.0*  8.9* 10.0* 9.2* 9.4* 9.0*  HCT 25.9* 29.0* 28.8* 32.4* 29.7* 30.4* 28.6*  MCV 93.2 93.5 95.4 96.1 95.8 98.1 96.6  PLT 439* 358 404* 393 390 380 404*   Cardiac Enzymes: No results for input(s): CKTOTAL, CKMB, CKMBINDEX, TROPONINI in the last 168 hours. BNP: BNP (last 3 results)  Recent Labs  05/18/15 1139  BNP 434.4*    ProBNP (last 3 results) No results for input(s): PROBNP in the last 8760 hours.  CBG:  Recent Labs Lab 12/06/15 1226 12/06/15 1732 12/06/15 2057 12/07/15 0756 12/07/15 1210  GLUCAP 293* 243* 256* 258* 303*       Signed:  Karmen Bongo  [facility] 12/07/2015, 2:09 PM

## 2015-12-08 ENCOUNTER — Ambulatory Visit: Payer: Medicare Other | Admitting: Infectious Disease

## 2015-12-08 ENCOUNTER — Encounter (HOSPITAL_BASED_OUTPATIENT_CLINIC_OR_DEPARTMENT_OTHER): Payer: Medicare Other | Attending: Internal Medicine

## 2015-12-08 DIAGNOSIS — L97511 Non-pressure chronic ulcer of other part of right foot limited to breakdown of skin: Secondary | ICD-10-CM | POA: Insufficient documentation

## 2015-12-08 DIAGNOSIS — L97311 Non-pressure chronic ulcer of right ankle limited to breakdown of skin: Secondary | ICD-10-CM | POA: Diagnosis not present

## 2015-12-08 DIAGNOSIS — I509 Heart failure, unspecified: Secondary | ICD-10-CM | POA: Insufficient documentation

## 2015-12-08 DIAGNOSIS — G4730 Sleep apnea, unspecified: Secondary | ICD-10-CM | POA: Diagnosis present

## 2015-12-08 DIAGNOSIS — I2510 Atherosclerotic heart disease of native coronary artery without angina pectoris: Secondary | ICD-10-CM | POA: Insufficient documentation

## 2015-12-08 DIAGNOSIS — H5442 Blindness, left eye, normal vision right eye: Secondary | ICD-10-CM | POA: Diagnosis not present

## 2015-12-08 DIAGNOSIS — E11319 Type 2 diabetes mellitus with unspecified diabetic retinopathy without macular edema: Secondary | ICD-10-CM | POA: Diagnosis present

## 2015-12-08 DIAGNOSIS — I132 Hypertensive heart and chronic kidney disease with heart failure and with stage 5 chronic kidney disease, or end stage renal disease: Secondary | ICD-10-CM | POA: Insufficient documentation

## 2015-12-08 DIAGNOSIS — E1151 Type 2 diabetes mellitus with diabetic peripheral angiopathy without gangrene: Secondary | ICD-10-CM | POA: Insufficient documentation

## 2015-12-08 DIAGNOSIS — Z992 Dependence on renal dialysis: Secondary | ICD-10-CM | POA: Diagnosis not present

## 2015-12-08 DIAGNOSIS — N186 End stage renal disease: Secondary | ICD-10-CM | POA: Diagnosis not present

## 2015-12-08 DIAGNOSIS — E1122 Type 2 diabetes mellitus with diabetic chronic kidney disease: Secondary | ICD-10-CM | POA: Insufficient documentation

## 2015-12-08 DIAGNOSIS — E1140 Type 2 diabetes mellitus with diabetic neuropathy, unspecified: Secondary | ICD-10-CM | POA: Insufficient documentation

## 2015-12-08 DIAGNOSIS — E11622 Type 2 diabetes mellitus with other skin ulcer: Secondary | ICD-10-CM | POA: Insufficient documentation

## 2015-12-08 DIAGNOSIS — Z89411 Acquired absence of right great toe: Secondary | ICD-10-CM | POA: Insufficient documentation

## 2015-12-08 DIAGNOSIS — E11621 Type 2 diabetes mellitus with foot ulcer: Secondary | ICD-10-CM | POA: Insufficient documentation

## 2015-12-08 DIAGNOSIS — M069 Rheumatoid arthritis, unspecified: Secondary | ICD-10-CM | POA: Diagnosis not present

## 2015-12-10 DIAGNOSIS — E11621 Type 2 diabetes mellitus with foot ulcer: Secondary | ICD-10-CM | POA: Diagnosis not present

## 2015-12-11 DIAGNOSIS — E11621 Type 2 diabetes mellitus with foot ulcer: Secondary | ICD-10-CM | POA: Diagnosis not present

## 2015-12-11 LAB — GLUCOSE, CAPILLARY
GLUCOSE-CAPILLARY: 136 mg/dL — AB (ref 65–99)
Glucose-Capillary: 240 mg/dL — ABNORMAL HIGH (ref 65–99)
Glucose-Capillary: 159 mg/dL — ABNORMAL HIGH (ref 65–99)

## 2015-12-12 DIAGNOSIS — E11621 Type 2 diabetes mellitus with foot ulcer: Secondary | ICD-10-CM | POA: Diagnosis not present

## 2015-12-12 LAB — GLUCOSE, CAPILLARY
GLUCOSE-CAPILLARY: 238 mg/dL — AB (ref 65–99)
Glucose-Capillary: 133 mg/dL — ABNORMAL HIGH (ref 65–99)
Glucose-Capillary: 96 mg/dL (ref 65–99)

## 2015-12-15 DIAGNOSIS — E11621 Type 2 diabetes mellitus with foot ulcer: Secondary | ICD-10-CM | POA: Diagnosis not present

## 2015-12-15 LAB — GLUCOSE, CAPILLARY
Glucose-Capillary: 163 mg/dL — ABNORMAL HIGH (ref 65–99)
Glucose-Capillary: 201 mg/dL — ABNORMAL HIGH (ref 65–99)

## 2015-12-16 DIAGNOSIS — E11621 Type 2 diabetes mellitus with foot ulcer: Secondary | ICD-10-CM | POA: Diagnosis not present

## 2015-12-16 LAB — GLUCOSE, CAPILLARY
Glucose-Capillary: 108 mg/dL — ABNORMAL HIGH (ref 65–99)
Glucose-Capillary: 123 mg/dL — ABNORMAL HIGH (ref 65–99)
Glucose-Capillary: 193 mg/dL — ABNORMAL HIGH (ref 65–99)

## 2015-12-17 DIAGNOSIS — E11621 Type 2 diabetes mellitus with foot ulcer: Secondary | ICD-10-CM | POA: Diagnosis not present

## 2015-12-17 LAB — GLUCOSE, CAPILLARY
Glucose-Capillary: 116 mg/dL — ABNORMAL HIGH (ref 65–99)
Glucose-Capillary: 158 mg/dL — ABNORMAL HIGH (ref 65–99)
Glucose-Capillary: 206 mg/dL — ABNORMAL HIGH (ref 65–99)

## 2016-01-02 ENCOUNTER — Ambulatory Visit: Payer: Medicare Other | Admitting: Vascular Surgery

## 2016-02-06 DEATH — deceased

## 2016-03-09 ENCOUNTER — Encounter (HOSPITAL_COMMUNITY): Payer: Self-pay | Admitting: *Deleted

## 2017-02-27 IMAGING — RF DG ESOPHAGUS
14 of 19 series · 14 of 19 positions shown · non-contrast
Comparison: Abdominal CT 04/23/2015.

CLINICAL DATA: Dysphagia with lower chest pressure when eating.
Initial encounter.

EXAM:
ESOPHOGRAM/BARIUM SWALLOW
TECHNIQUE: Single contrast examination was performed using  thin barium.
FLUOROSCOPY TIME:  Radiation Exposure Index (as provided by the
fluoroscopic device):
If the device does not provide the exposure index:
Fluoroscopy Time:  1 minute and 6 seconds
Number of Acquired Images:  All images acquired with fluoro store.

[Series 1: run · 1 of 1 slices shown (1 of 14)]
[im 1/1]
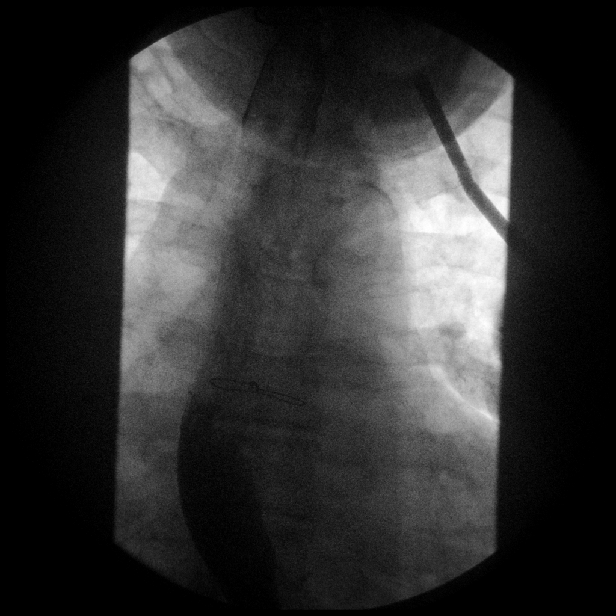

[Series 3: run · 1 of 1 slices shown (2 of 14)]
[im 1/1]
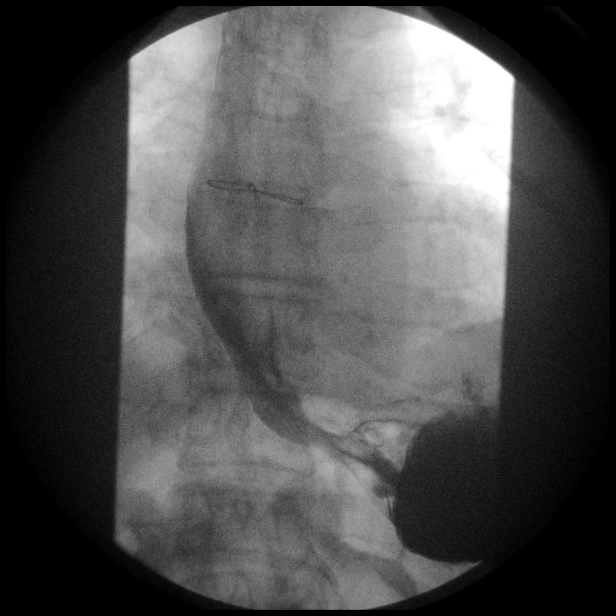

[Series 4: run · 1 of 1 slices shown (3 of 14)]
[im 1/1]
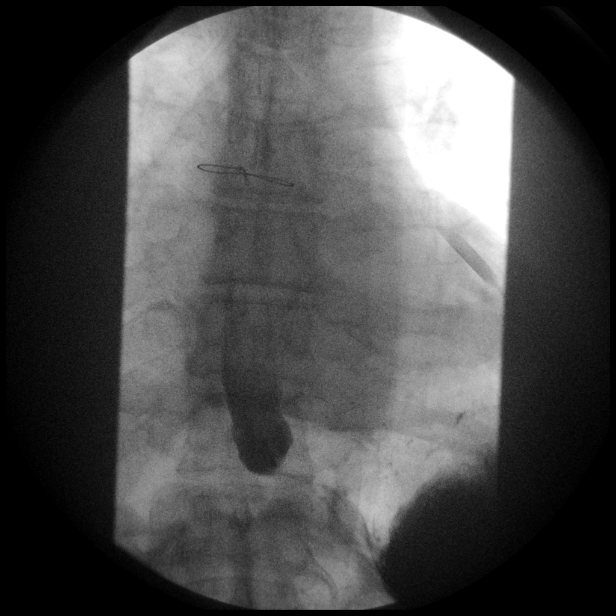

[Series 5: run · 1 of 1 slices shown (4 of 14)]
[im 1/1]
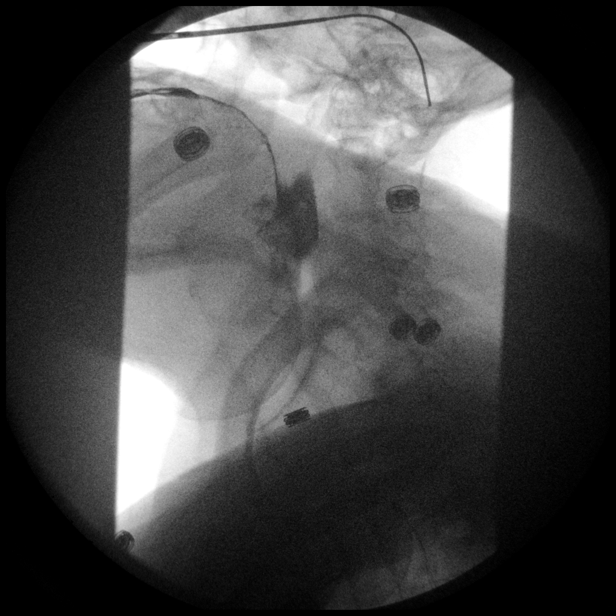

[Series 7: run · 1 of 1 slices shown (5 of 14)]
[im 1/1]
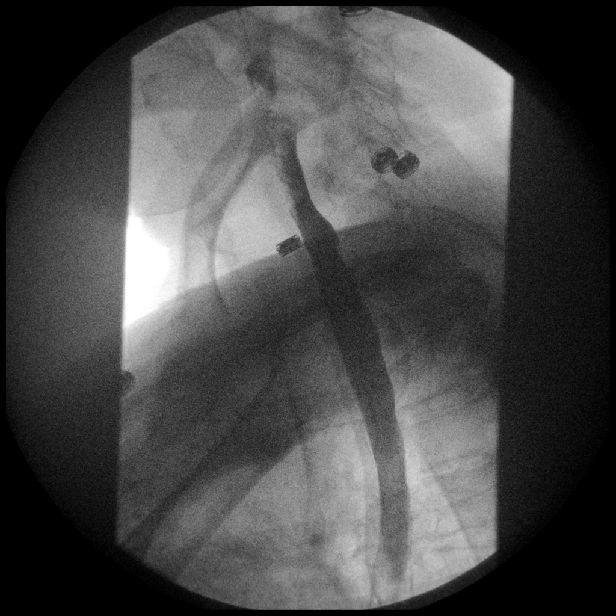

[Series 8: run · 1 of 1 slices shown (6 of 14)]
[im 1/1]
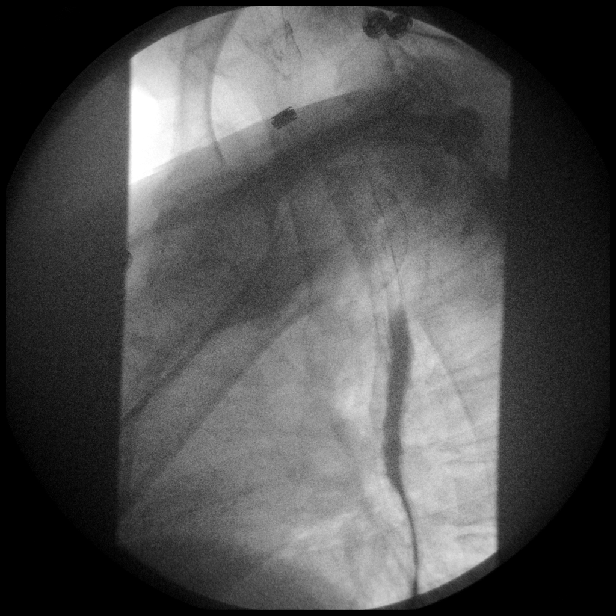

[Series 9: run · 1 of 1 slices shown (7 of 14)]
[im 1/1]
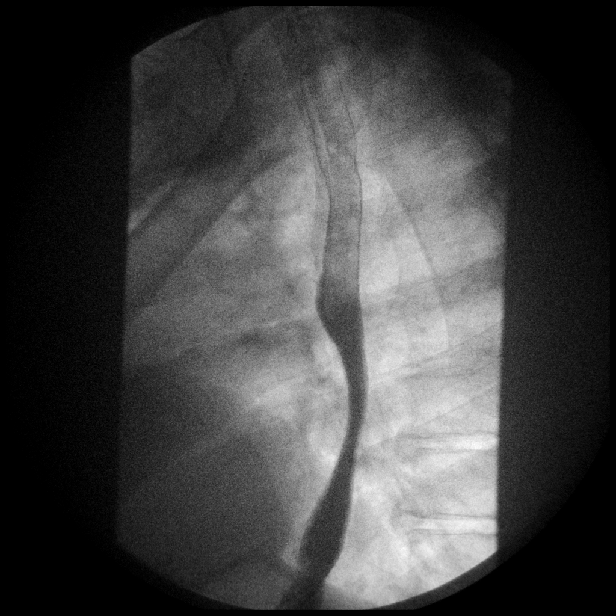

[Series 11: run · 1 of 1 slices shown (8 of 14)]
[im 1/1]
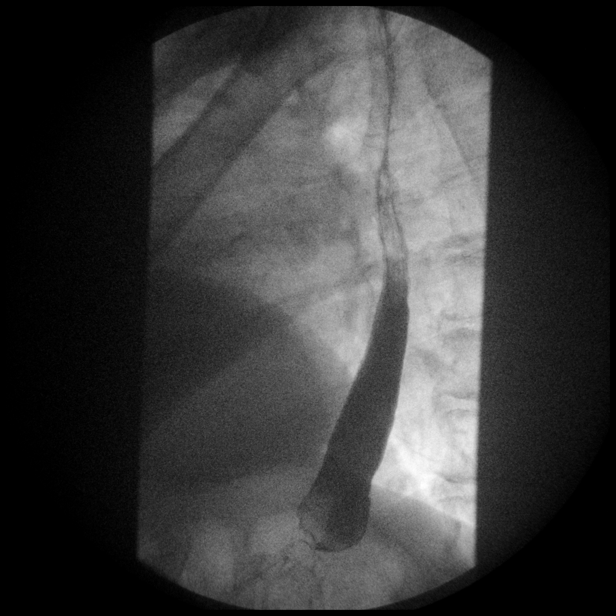

[Series 12: run · 1 of 1 slices shown (9 of 14)]
[im 1/1]
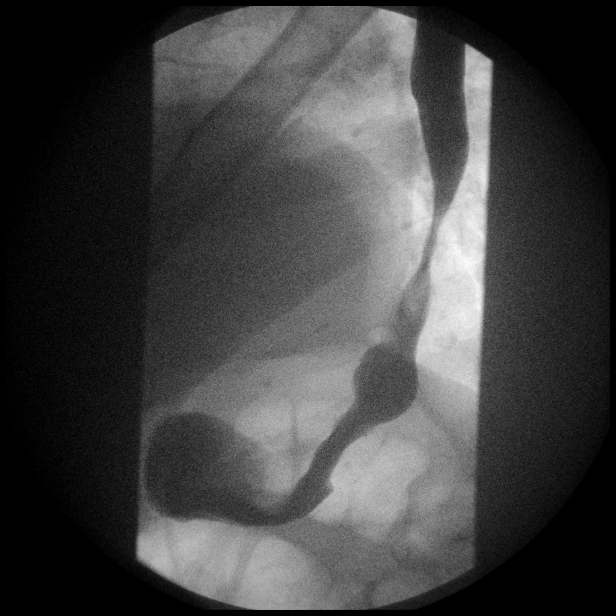

[Series 13: run · 1 of 1 slices shown (10 of 14)]
[im 1/1]
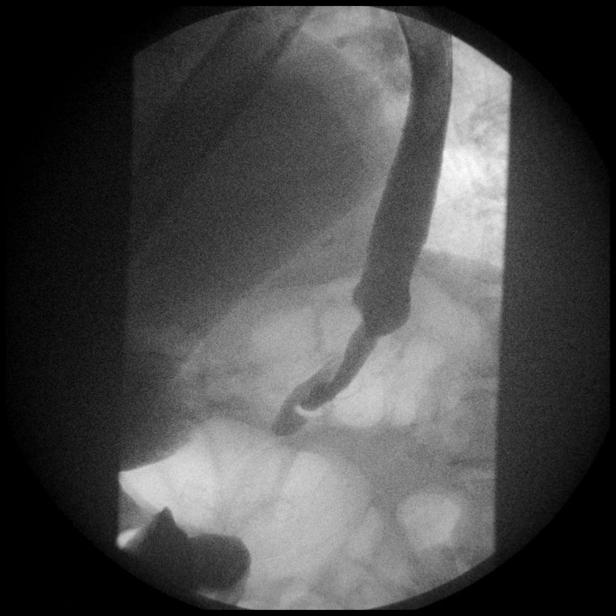

[Series 15: run · 1 of 1 slices shown (11 of 14)]
[im 1/1]
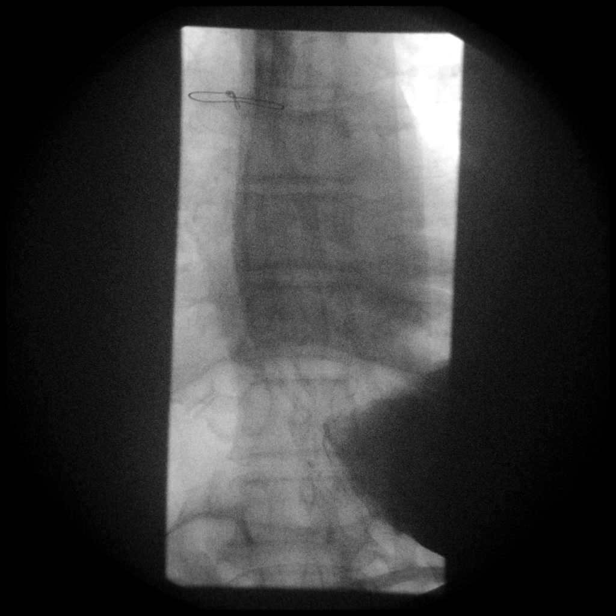

[Series 16: run · 1 of 1 slices shown (12 of 14)]
[im 1/1]
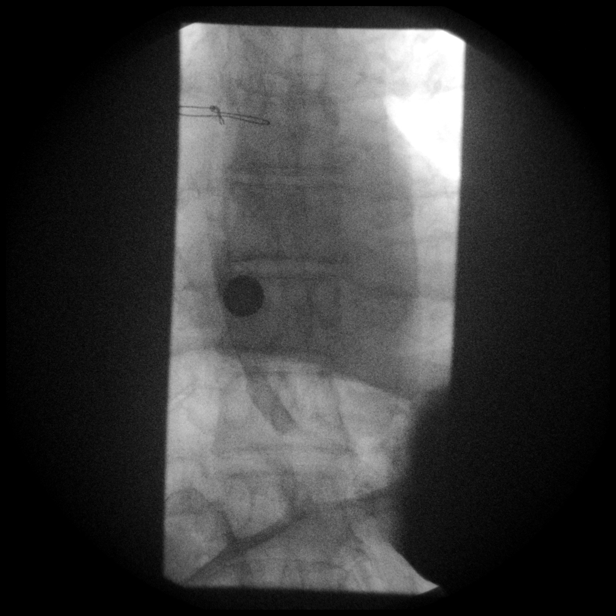

[Series 17: run · 1 of 1 slices shown (13 of 14)]
[im 1/1]
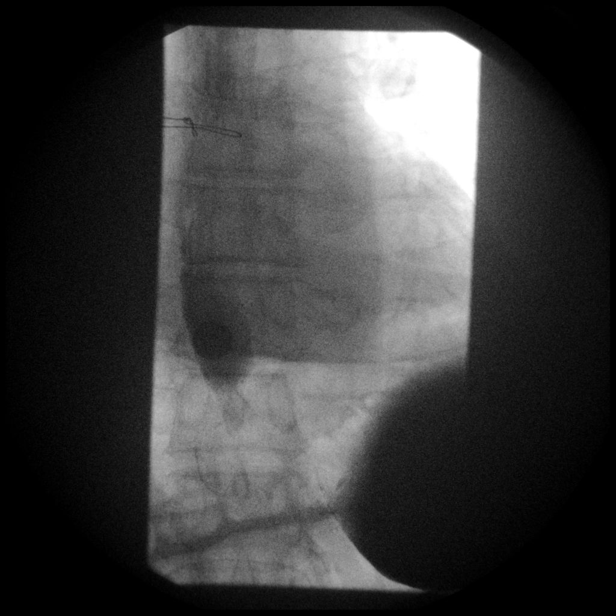

[Series 19: run · 1 of 1 slices shown (14 of 14)]
[im 1/1]
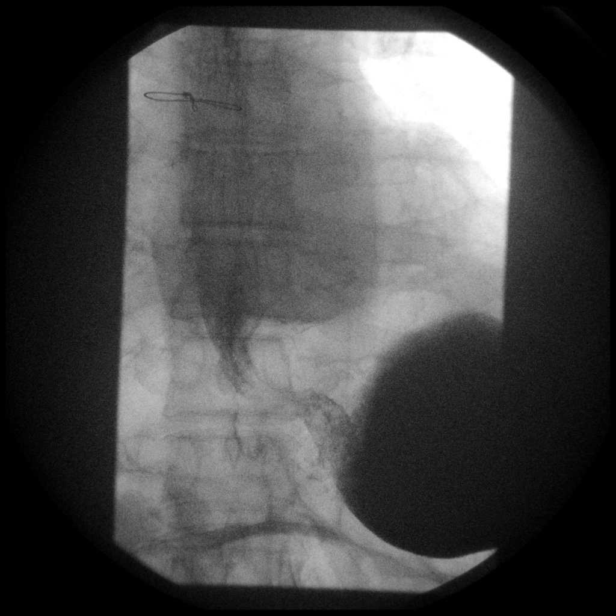

[14 of 19 positions shown; findings below may reference images not displayed]

FINDINGS: Study was performed in the supine, semi erect and right lateral
decubitus positions due to the patient's limited mobility. The
esophageal motility is within normal limits. No mucosal ulceration,
stricture or mass identified. No laryngeal penetration or cervical
esophageal abnormality identified.

No significant gastroesophageal reflux was elicited with the water
siphon test.

A 13 mm barium tablet was administered. This was retained in the
distal esophagus for approximately 2 minutes. With the aid of thin
barium and applesauce, this tablet did eventually pass into the
stomach.
IMPRESSION: 1. No significant findings identified. No evidence of esophagitis or
significant stricture.
2. Barium tablet was temporarily retained within the distal
esophagus, although did pass into the stomach.
